# Patient Record
Sex: Female | Born: 1940
Health system: Southern US, Community
[De-identification: ages and names within clinical notes are randomized; demographics above are authoritative.]

## PROBLEM LIST (undated history)

## (undated) DIAGNOSIS — R55 Syncope and collapse: Secondary | ICD-10-CM

## (undated) DIAGNOSIS — R519 Headache, unspecified: Secondary | ICD-10-CM

## (undated) DIAGNOSIS — J302 Other seasonal allergic rhinitis: Secondary | ICD-10-CM

## (undated) DIAGNOSIS — E785 Hyperlipidemia, unspecified: Secondary | ICD-10-CM

## (undated) DIAGNOSIS — D62 Acute posthemorrhagic anemia: Secondary | ICD-10-CM

## (undated) DIAGNOSIS — I2510 Atherosclerotic heart disease of native coronary artery without angina pectoris: Secondary | ICD-10-CM

## (undated) DIAGNOSIS — M1711 Unilateral primary osteoarthritis, right knee: Secondary | ICD-10-CM

## (undated) DIAGNOSIS — E876 Hypokalemia: Secondary | ICD-10-CM

## (undated) DIAGNOSIS — E119 Type 2 diabetes mellitus without complications: Secondary | ICD-10-CM

## (undated) DIAGNOSIS — K219 Gastro-esophageal reflux disease without esophagitis: Secondary | ICD-10-CM

## (undated) DIAGNOSIS — Z9289 Personal history of other medical treatment: Secondary | ICD-10-CM

## (undated) DIAGNOSIS — I10 Essential (primary) hypertension: Secondary | ICD-10-CM

## (undated) DIAGNOSIS — I69322 Dysarthria following cerebral infarction: Secondary | ICD-10-CM

## (undated) DIAGNOSIS — IMO0001 Reserved for inherently not codable concepts without codable children: Secondary | ICD-10-CM

## (undated) DIAGNOSIS — I119 Hypertensive heart disease without heart failure: Secondary | ICD-10-CM

## (undated) DIAGNOSIS — R29810 Facial weakness: Secondary | ICD-10-CM

## (undated) DIAGNOSIS — I639 Cerebral infarction, unspecified: Secondary | ICD-10-CM

## (undated) DIAGNOSIS — M19041 Primary osteoarthritis, right hand: Secondary | ICD-10-CM

## (undated) DIAGNOSIS — D649 Anemia, unspecified: Secondary | ICD-10-CM

## (undated) DIAGNOSIS — E7800 Pure hypercholesterolemia, unspecified: Secondary | ICD-10-CM

## (undated) DIAGNOSIS — I69391 Dysphagia following cerebral infarction: Secondary | ICD-10-CM

## (undated) DIAGNOSIS — I5189 Other ill-defined heart diseases: Secondary | ICD-10-CM

## (undated) DIAGNOSIS — M1990 Unspecified osteoarthritis, unspecified site: Secondary | ICD-10-CM

## (undated) HISTORY — PX: BUNIONECTOMY: SHX129

## (undated) HISTORY — DX: Acute posthemorrhagic anemia: D62

## (undated) HISTORY — PX: MR LOWER LEG LEFT (ARMC HX): HXRAD1784

## (undated) HISTORY — DX: Hyperlipidemia, unspecified: E78.5

## (undated) HISTORY — DX: Type 2 diabetes mellitus without complications: E11.9

## (undated) HISTORY — DX: Unilateral primary osteoarthritis, right knee: M17.11

## (undated) HISTORY — DX: Cerebral infarction, unspecified: I63.9

## (undated) HISTORY — PX: CATARACT EXTRACTION W/ INTRAOCULAR LENS  IMPLANT, BILATERAL: SHX1307

## (undated) HISTORY — PX: KNEE ARTHROSCOPY WITH EXCISION BAKER'S CYST: SHX5646

## (undated) HISTORY — DX: Hypertensive heart disease without heart failure: I11.9

## (undated) HISTORY — DX: Dysphagia following cerebral infarction: I69.391

## (undated) HISTORY — DX: Other ill-defined heart diseases: I51.89

## (undated) HISTORY — DX: Primary osteoarthritis, right hand: M19.041

## (undated) HISTORY — DX: Facial weakness: R29.810

## (undated) HISTORY — PX: INGUINAL HERNIA REPAIR: SUR1180

## (undated) HISTORY — DX: Dysarthria following cerebral infarction: I69.322

## (undated) HISTORY — DX: Hypokalemia: E87.6

## (undated) HISTORY — DX: Atherosclerotic heart disease of native coronary artery without angina pectoris: I25.10

## (undated) HISTORY — PX: VEIN LIGATION AND STRIPPING: SHX2653

---

## 2015-07-16 DIAGNOSIS — M1990 Unspecified osteoarthritis, unspecified site: Secondary | ICD-10-CM | POA: Diagnosis not present

## 2015-07-16 DIAGNOSIS — E7800 Pure hypercholesterolemia, unspecified: Secondary | ICD-10-CM | POA: Diagnosis not present

## 2015-07-16 DIAGNOSIS — Z23 Encounter for immunization: Secondary | ICD-10-CM | POA: Diagnosis not present

## 2015-07-16 DIAGNOSIS — G629 Polyneuropathy, unspecified: Secondary | ICD-10-CM | POA: Diagnosis not present

## 2015-07-16 DIAGNOSIS — I10 Essential (primary) hypertension: Secondary | ICD-10-CM | POA: Diagnosis not present

## 2015-07-16 DIAGNOSIS — N3281 Overactive bladder: Secondary | ICD-10-CM | POA: Diagnosis not present

## 2015-08-18 DIAGNOSIS — M25561 Pain in right knee: Secondary | ICD-10-CM | POA: Diagnosis not present

## 2015-08-18 DIAGNOSIS — M25562 Pain in left knee: Secondary | ICD-10-CM | POA: Diagnosis not present

## 2015-09-15 DIAGNOSIS — M25561 Pain in right knee: Secondary | ICD-10-CM | POA: Diagnosis not present

## 2015-09-15 DIAGNOSIS — M25562 Pain in left knee: Secondary | ICD-10-CM | POA: Diagnosis not present

## 2015-10-13 DIAGNOSIS — M170 Bilateral primary osteoarthritis of knee: Secondary | ICD-10-CM | POA: Diagnosis not present

## 2015-12-10 DIAGNOSIS — M25561 Pain in right knee: Secondary | ICD-10-CM | POA: Diagnosis not present

## 2015-12-29 ENCOUNTER — Other Ambulatory Visit: Payer: Self-pay | Admitting: Physician Assistant

## 2015-12-31 ENCOUNTER — Other Ambulatory Visit (HOSPITAL_COMMUNITY): Payer: Self-pay | Admitting: *Deleted

## 2015-12-31 ENCOUNTER — Encounter (HOSPITAL_COMMUNITY): Payer: Self-pay

## 2015-12-31 ENCOUNTER — Encounter (HOSPITAL_COMMUNITY)
Admission: RE | Admit: 2015-12-31 | Discharge: 2015-12-31 | Disposition: A | Discharge status: Home / Self Care | Payer: Medicare Other | Source: Ambulatory Visit | Attending: Orthopaedic Surgery | Admitting: Orthopaedic Surgery

## 2015-12-31 DIAGNOSIS — I10 Essential (primary) hypertension: Secondary | ICD-10-CM | POA: Diagnosis not present

## 2015-12-31 DIAGNOSIS — E119 Type 2 diabetes mellitus without complications: Secondary | ICD-10-CM | POA: Diagnosis not present

## 2015-12-31 DIAGNOSIS — Z01812 Encounter for preprocedural laboratory examination: Secondary | ICD-10-CM | POA: Insufficient documentation

## 2015-12-31 DIAGNOSIS — Z01818 Encounter for other preprocedural examination: Secondary | ICD-10-CM | POA: Diagnosis not present

## 2015-12-31 DIAGNOSIS — K219 Gastro-esophageal reflux disease without esophagitis: Secondary | ICD-10-CM | POA: Insufficient documentation

## 2015-12-31 DIAGNOSIS — M1711 Unilateral primary osteoarthritis, right knee: Secondary | ICD-10-CM | POA: Diagnosis not present

## 2015-12-31 HISTORY — DX: Reserved for inherently not codable concepts without codable children: IMO0001

## 2015-12-31 HISTORY — DX: Essential (primary) hypertension: I10

## 2015-12-31 HISTORY — DX: Other seasonal allergic rhinitis: J30.2

## 2015-12-31 HISTORY — DX: Gastro-esophageal reflux disease without esophagitis: K21.9

## 2015-12-31 HISTORY — DX: Syncope and collapse: R55

## 2015-12-31 HISTORY — DX: Unspecified osteoarthritis, unspecified site: M19.90

## 2015-12-31 LAB — BASIC METABOLIC PANEL
ANION GAP: 10 (ref 5–15)
BUN: 23 mg/dL — AB (ref 6–20)
CALCIUM: 9.2 mg/dL (ref 8.9–10.3)
CO2: 25 mmol/L (ref 22–32)
CREATININE: 0.95 mg/dL (ref 0.44–1.00)
Chloride: 107 mmol/L (ref 101–111)
GFR calc Af Amer: >60 mL/min (ref >60)
GFR calc non Af Amer: 56 mL/min — ABNORMAL LOW (ref >60)
GLUCOSE: 89 mg/dL (ref 65–99)
Potassium: 4.1 mmol/L (ref 3.5–5.1)
Sodium: 142 mmol/L (ref 135–145)

## 2015-12-31 LAB — CBC
HCT: 38.8 % (ref 36.0–46.0)
HEMOGLOBIN: 12.4 g/dL (ref 12.0–15.0)
MCH: 27.6 pg (ref 26.0–34.0)
MCHC: 32 g/dL (ref 30.0–36.0)
MCV: 86.2 fL (ref 78.0–100.0)
Platelets: 224 10*3/uL (ref 150–400)
RBC: 4.5 MIL/uL (ref 3.87–5.11)
RDW: 14 % (ref 11.5–15.5)
WBC: 5.2 10*3/uL (ref 4.0–10.5)

## 2015-12-31 LAB — GLUCOSE, CAPILLARY: GLUCOSE-CAPILLARY: 96 mg/dL (ref 65–99)

## 2015-12-31 NOTE — Pre-Procedure Instructions (Signed)
    Letoya Figeroa  12/31/2015      [facility] - [place], [place] - [address] [place] [address] Phone: [phone] Fax: [phone]    Your procedure is scheduled on Tuesday, January 06, 2016 at 3:30 PM.   Report to  Hospital Entrance "A" Admitting Office at 1:30 PM.   Call this number if you have problems the morning of surgery: [phone]   Any questions prior to day of surgery, please call [phone] between 8 & 4 PM.   Remember:  Do not eat food or drink liquids after midnight. Monday, 01/05/16.  Take these medicines the morning of surgery with A SIP OF WATER: Gabapentin Enacarbil (Horizant), Metoprolol (Toprol XL), Tramadol - if needed  Stop NSAIDS (Meloxicam, Mobic, Aleve, Ibuprofen, etc.) as of today.   Do not wear jewelry, make-up or nail polish.  Do not wear lotions, powders, or perfumes.  You may wear deodorant.  Do not shave 48 hours prior to surgery.    Do not bring valuables to the hospital.  [facility] is not responsible for any belongings or valuables.  Contacts, dentures or bridgework may not be worn into surgery.  Leave your suitcase in the car.  After surgery it may be brought to your room.  For patients admitted to the hospital, discharge time will be determined by your treatment team.  Special instructions:  See "Preparing for Surgery" Instruction sheet.  Please read over the following fact sheets that you were given. Pain Booklet, Coughing and Deep Breathing and Surgical Site Infection Prevention       

## 2015-12-31 NOTE — Pre-Procedure Instructions (Signed)
    Kristi David  12/31/2015      [facility] - [place] - [address] [place] [address] Phone: [phone] Fax: [phone]    Your procedure is scheduled on Tuesday, January 06, 2016 at 3:30 PM.   [facility] to [facility] Entrance "A" Admitting Office at 1:30 PM.   Call this number if you have problems the morning of surgery: [phone]   Any questions prior to day of surgery, please call [phone] between 8 & 4 PM.   Remember:  Do not eat food or drink liquids after midnight. Monday, 01/05/16.  Take these medicines the morning of surgery with A SIP OF WATER: Gabapentin Enacarbil (Horizant), Metoprolol (Toprol XL), Tramadol - if needed  Stop NSAIDS (Meloxicam, Mobic, Aleve, Ibuprofen, etc.) as of today.   Do not wear jewelry, make-up or nail polish.  Do not wear lotions, powders, or perfumes.  You may wear deodorant.  Do not shave 48 hours prior to surgery.    Do not bring valuables to the hospital.  [facility] is not responsible for any belongings or valuables.  Contacts, dentures or bridgework may not be worn into surgery.  Leave your suitcase in the car.  After surgery it may be brought to your room.  For patients admitted to the hospital, discharge time will be determined by your treatment team.  Special instructions:  See "Preparing for Surgery" Instruction sheet.  Please read over the following fact sheets that you were given. Pain Booklet, Coughing and Deep Breathing and Surgical Site Infection Prevention

## 2015-12-31 NOTE — Progress Notes (Signed)
[place] as of July 2016.  She has never been a patient at [facility].   She was admitted [facility] [place] for a few after a syncope episode and passing out at which time she saw a cardiologist named Dr Archie EndoKhawaja Hussain [phone]).  She states she had a stress test and echocardiogram at that time.  These tests and last office visit note have been requested.  [place] in [place] is Dr Sigmund HazelLisa Miller with [facility] [place] last seen a few months ago  States has been told she is a Type 2 diabetic but is diet controlled.    EKG done today.

## 2016-01-01 LAB — HEMOGLOBIN A1C
Hgb A1c MFr Bld: 5.2 % (ref 4.8–5.6)
Mean Plasma Glucose: 103 mg/dL

## 2016-01-02 NOTE — Progress Notes (Addendum)
Anesthesia Chart Review: Patient is a 75 year old female scheduled for right knee partial arthroplasty on 01/06/16 by Dr. Doneen Poissonhristopher Blackman.  History includes syncope with hospitalization at [facility] in [place], [place] 03[place], HTN, exertional dyspnea, DM2 (diet controlled), GERD, seasonal allergies, BLE varicose vein stripping, bilateral baker's cyst excision, hernia repair.  [place] to [place] 03[place]. Her PCP is Dr. Sigmund HazelLisa Miller with [facility] [place]. She had been evaluated by cardiologist Dr. Archie EndoHussain Khawaja [facility] in [place] in the past. She reported a normal stress and echo, and states they never found anything to explain her syncopal spell.   12/31/15 EKG: NSR, LAD, borderline QT interval. Possible anterior infarct is no longer present but otherwise stable when compared to 01/08/15 received from from Dr. Archie EndoKhawaja Hussain.  10/07/14 Nuclear stress test ([facility], [place], [place] Impression: 1. No definite scintigraphic evidence of vasodilator induced myocardial ischemia. The slightly worsened perfusion to the anterior segments on stress compared to rest is favored to be secondary to shifting breast attenuation. This does limit evaluation for ischemia within the anterior wall.  2. Normal LV wall motion and wall thickening. 3. Normal visually estimated LV function with a calculated EF of 86%. 4. Focus of superficial radiotracer activity on or near the skin of the left breast. This is not seen on the rest acquisition. I favor this to represent radiotracer contamination on the skin, but correlation with mammogram is recommended to exclude underlying breast neoplasm. 5. Findings were discussed with Dr. Wynonia HazardKhawaja 10/07/14 at 12:54 (Dr. Harley AltoStewart Hawkins)  Preoperative labs noted. A1c 5.2   [facility] requested. Will leave chart for follow-up.  Velna Ochsllison Zelenak, PA-C [facility] Short Stay Center/Anesthesiology Phone [phone]  [phone] 01/02/2016 4:26 PM  Addendum: 08[facility] She was hospitalized for syncope at home after getting home from church. She was hypotensive (73/37) and bradycardic by EMS. While at the hospital, CXR showed pulmonary congestion bilaterally and question of linear density in the right basilar ganglia and CTA of the head and neck recommended which no acute vascular abnormality. Work-up for PE was negative. Carotid doppler showed no hemodynamically significant stenosis with minimal to mild intimal thickening. Neurology (Dr. Kathryne SharperHani Midani) was consulted. No clear neurologic etiology identified, so cardiology consult recommended. She was seen by Dr. Archie EndoHussain Khawaja. Echo ordered (see below). He was planning an out-patient Holter monitor.   09/02/14 Echo showed: Concentric LVH with preserved systolic function, estimated EF > 60%. Diastolic dysfunction. Trace MR. Mild TR. Estimated RVSP 36 mmHg. No evidence of PFO/ASD per color flow doppler. No evidence of intracardiac shunting per bubble study performed. Consider TEE if clinical indicated for further evaluation. No prior study for comparison.  Last office note from 04/07/15 received from Dr. Octavia HeirKhawja. By notes, her 09/03/14 30 day Cardionet showed: Normal study except occasional PVCs, HR ranging from 83-133. His note also stated that the radiotracer activity in the left breast found during her 2016 stress test was being followed by her PCP.   Based on currently available records, I would anticipate that she could proceed as planned in no acute changes.  Velna Ochsllison Zelenak, PA-C [facility] Stay Center/Anesthesiology Phone [phone] 01/05/2016 2:55 PM

## 2016-01-05 MED ORDER — CEFAZOLIN SODIUM-DEXTROSE 2-4 GM/100ML-% IV SOLN
2.0000 g | INTRAVENOUS | Status: AC
  Administered 2016-01-06: 2 g via INTRAVENOUS
  Filled 2016-01-05: qty 100

## 2016-01-06 ENCOUNTER — Ambulatory Visit (HOSPITAL_COMMUNITY): Payer: Medicare Other | Admitting: Vascular Surgery

## 2016-01-06 ENCOUNTER — Observation Stay (HOSPITAL_COMMUNITY)
Admission: RE | Admit: 2016-01-06 | Discharge: 2016-01-08 | Disposition: A | Discharge status: Home / Self Care | Payer: Medicare Other | Source: Ambulatory Visit | Attending: Orthopaedic Surgery | Admitting: Orthopaedic Surgery

## 2016-01-06 ENCOUNTER — Encounter (HOSPITAL_COMMUNITY): Payer: Self-pay | Admitting: *Deleted

## 2016-01-06 ENCOUNTER — Observation Stay (HOSPITAL_COMMUNITY): Payer: Medicare Other

## 2016-01-06 ENCOUNTER — Ambulatory Visit (HOSPITAL_COMMUNITY): Payer: Medicare Other | Admitting: Anesthesiology

## 2016-01-06 ENCOUNTER — Encounter (HOSPITAL_COMMUNITY)
Admission: RE | Disposition: A | Discharge status: Home / Self Care | Payer: Self-pay | Source: Ambulatory Visit | Attending: Orthopaedic Surgery

## 2016-01-06 DIAGNOSIS — Z96651 Presence of right artificial knee joint: Secondary | ICD-10-CM

## 2016-01-06 DIAGNOSIS — E119 Type 2 diabetes mellitus without complications: Secondary | ICD-10-CM | POA: Diagnosis not present

## 2016-01-06 DIAGNOSIS — M1711 Unilateral primary osteoarthritis, right knee: Secondary | ICD-10-CM | POA: Diagnosis not present

## 2016-01-06 DIAGNOSIS — Z471 Aftercare following joint replacement surgery: Secondary | ICD-10-CM | POA: Diagnosis not present

## 2016-01-06 DIAGNOSIS — G8918 Other acute postprocedural pain: Secondary | ICD-10-CM | POA: Diagnosis not present

## 2016-01-06 DIAGNOSIS — M179 Osteoarthritis of knee, unspecified: Secondary | ICD-10-CM | POA: Diagnosis not present

## 2016-01-06 DIAGNOSIS — I10 Essential (primary) hypertension: Secondary | ICD-10-CM | POA: Diagnosis not present

## 2016-01-06 HISTORY — DX: Type 2 diabetes mellitus without complications: E11.9

## 2016-01-06 HISTORY — DX: Pure hypercholesterolemia, unspecified: E78.00

## 2016-01-06 HISTORY — DX: Anemia, unspecified: D64.9

## 2016-01-06 HISTORY — DX: Personal history of other medical treatment: Z92.89

## 2016-01-06 HISTORY — PX: PARTIAL KNEE ARTHROPLASTY: SHX2174

## 2016-01-06 HISTORY — DX: Unilateral primary osteoarthritis, right knee: M17.11

## 2016-01-06 LAB — GLUCOSE, CAPILLARY
GLUCOSE-CAPILLARY: 85 mg/dL (ref 65–99)
Glucose-Capillary: 72 mg/dL (ref 65–99)

## 2016-01-06 SURGERY — ARTHROPLASTY, KNEE, UNICOMPARTMENTAL
Anesthesia: Regional | Site: Knee | Laterality: Right

## 2016-01-06 MED ORDER — OXYCODONE HCL 5 MG PO TABS
ORAL_TABLET | ORAL | Status: AC
  Filled 2016-01-06: qty 2

## 2016-01-06 MED ORDER — CEFAZOLIN SODIUM 1-5 GM-% IV SOLN
1.0000 g | Freq: Four times a day (QID) | INTRAVENOUS | Status: AC
  Administered 2016-01-06 – 2016-01-07 (×2): 1 g via INTRAVENOUS
  Filled 2016-01-06 (×2): qty 50

## 2016-01-06 MED ORDER — HYDROMORPHONE HCL 1 MG/ML IJ SOLN: 0.5000 mg | INTRAMUSCULAR | Status: DC | PRN

## 2016-01-06 MED ORDER — FENTANYL CITRATE (PF) 100 MCG/2ML IJ SOLN
25.0000 ug | INTRAMUSCULAR | Status: DC | PRN
  Administered 2016-01-06: 50 ug via INTRAVENOUS

## 2016-01-06 MED ORDER — ALUM & MAG HYDROXIDE-SIMETH 200-200-20 MG/5ML PO SUSP: 30.0000 mL | ORAL | Status: DC | PRN

## 2016-01-06 MED ORDER — LACTATED RINGERS IV SOLN
INTRAVENOUS | Status: DC | PRN
  Administered 2016-01-06 (×2): via INTRAVENOUS

## 2016-01-06 MED ORDER — ONDANSETRON HCL 4 MG PO TABS: 4.0000 mg | ORAL_TABLET | Freq: Four times a day (QID) | ORAL | Status: DC | PRN

## 2016-01-06 MED ORDER — FENTANYL CITRATE (PF) 100 MCG/2ML IJ SOLN
INTRAMUSCULAR | Status: AC
  Filled 2016-01-06: qty 2

## 2016-01-06 MED ORDER — FENTANYL CITRATE (PF) 100 MCG/2ML IJ SOLN
INTRAMUSCULAR | Status: AC
  Administered 2016-01-06: 100 ug via INTRAVENOUS
  Filled 2016-01-06: qty 2

## 2016-01-06 MED ORDER — LOSARTAN POTASSIUM 50 MG PO TABS
50.0000 mg | ORAL_TABLET | Freq: Every day | ORAL | Status: DC
  Administered 2016-01-06 – 2016-01-08 (×2): 50 mg via ORAL
  Filled 2016-01-06 (×3): qty 1

## 2016-01-06 MED ORDER — PROPOFOL 500 MG/50ML IV EMUL
INTRAVENOUS | Status: DC | PRN
  Administered 2016-01-06: 75 ug/kg/min via INTRAVENOUS

## 2016-01-06 MED ORDER — METHOCARBAMOL 500 MG PO TABS
ORAL_TABLET | ORAL | Status: AC
  Filled 2016-01-06: qty 1

## 2016-01-06 MED ORDER — DIPHENHYDRAMINE HCL 12.5 MG/5ML PO ELIX: 12.5000 mg | ORAL_SOLUTION | ORAL | Status: DC | PRN

## 2016-01-06 MED ORDER — GABAPENTIN ENACARBIL ER 600 MG PO TBCR
600.0000 mg | EXTENDED_RELEASE_TABLET | Freq: Every day | ORAL | Status: DC
  Filled 2016-01-06: qty 1

## 2016-01-06 MED ORDER — METHOCARBAMOL 500 MG PO TABS
500.0000 mg | ORAL_TABLET | Freq: Four times a day (QID) | ORAL | Status: DC | PRN
  Administered 2016-01-06 – 2016-01-07 (×3): 500 mg via ORAL
  Filled 2016-01-06 (×2): qty 1

## 2016-01-06 MED ORDER — SODIUM CHLORIDE 0.9 % IR SOLN
Status: DC | PRN
  Administered 2016-01-06: 1000 mL

## 2016-01-06 MED ORDER — SODIUM CHLORIDE 0.9 % IV SOLN
INTRAVENOUS | Status: DC
  Administered 2016-01-07: 05:00:00 via INTRAVENOUS

## 2016-01-06 MED ORDER — METOCLOPRAMIDE HCL 5 MG/ML IJ SOLN: 5.0000 mg | Freq: Three times a day (TID) | INTRAMUSCULAR | Status: DC | PRN

## 2016-01-06 MED ORDER — ONDANSETRON HCL 4 MG/2ML IJ SOLN: 4.0000 mg | Freq: Four times a day (QID) | INTRAMUSCULAR | Status: DC | PRN

## 2016-01-06 MED ORDER — FENTANYL CITRATE (PF) 100 MCG/2ML IJ SOLN
100.0000 ug | Freq: Once | INTRAMUSCULAR | Status: AC
Start: 2016-01-06 — End: 2016-01-06
  Administered 2016-01-06: 100 ug via INTRAVENOUS
  Filled 2016-01-06: qty 2

## 2016-01-06 MED ORDER — DEXTROSE 5 % IV SOLN
500.0000 mg | Freq: Four times a day (QID) | INTRAVENOUS | Status: DC | PRN
  Filled 2016-01-06: qty 5

## 2016-01-06 MED ORDER — METOCLOPRAMIDE HCL 5 MG PO TABS: 5.0000 mg | ORAL_TABLET | Freq: Three times a day (TID) | ORAL | Status: DC | PRN

## 2016-01-06 MED ORDER — METOPROLOL SUCCINATE ER 25 MG PO TB24
25.0000 mg | ORAL_TABLET | Freq: Every day | ORAL | Status: DC
  Administered 2016-01-07 – 2016-01-08 (×2): 25 mg via ORAL
  Filled 2016-01-06 (×2): qty 1

## 2016-01-06 MED ORDER — ZOLPIDEM TARTRATE 5 MG PO TABS: 5.0000 mg | ORAL_TABLET | Freq: Every evening | ORAL | Status: DC | PRN

## 2016-01-06 MED ORDER — PHENOL 1.4 % MT LIQD: 1.0000 | OROMUCOSAL | Status: DC | PRN

## 2016-01-06 MED ORDER — MIDAZOLAM HCL 2 MG/2ML IJ SOLN
INTRAMUSCULAR | Status: AC
Start: 2016-01-06 — End: 2016-01-06
  Administered 2016-01-06: 2 mg via INTRAVENOUS
  Filled 2016-01-06: qty 2

## 2016-01-06 MED ORDER — HYDRALAZINE HCL 20 MG/ML IJ SOLN: 20.0000 mg | Freq: Four times a day (QID) | INTRAMUSCULAR | Status: DC | PRN

## 2016-01-06 MED ORDER — LACTATED RINGERS IV SOLN
INTRAVENOUS | Status: DC
  Administered 2016-01-06: 13:00:00 via INTRAVENOUS

## 2016-01-06 MED ORDER — 0.9 % SODIUM CHLORIDE (POUR BTL) OPTIME
TOPICAL | Status: DC | PRN
  Administered 2016-01-06: 1000 mL

## 2016-01-06 MED ORDER — MIDAZOLAM HCL 2 MG/2ML IJ SOLN
2.0000 mg | Freq: Once | INTRAMUSCULAR | Status: AC
  Administered 2016-01-06: 2 mg via INTRAVENOUS
  Filled 2016-01-06: qty 2

## 2016-01-06 MED ORDER — MEPERIDINE HCL 25 MG/ML IJ SOLN: 6.2500 mg | INTRAMUSCULAR | Status: DC | PRN

## 2016-01-06 MED ORDER — METOCLOPRAMIDE HCL 5 MG/ML IJ SOLN: 10.0000 mg | Freq: Once | INTRAMUSCULAR | Status: DC | PRN

## 2016-01-06 MED ORDER — CHLORHEXIDINE GLUCONATE 4 % EX LIQD: 60.0000 mL | Freq: Once | CUTANEOUS | Status: DC

## 2016-01-06 MED ORDER — ACETAMINOPHEN 325 MG PO TABS: 650.0000 mg | ORAL_TABLET | Freq: Four times a day (QID) | ORAL | Status: DC | PRN

## 2016-01-06 MED ORDER — PROPOFOL 10 MG/ML IV BOLUS
INTRAVENOUS | Status: DC | PRN
  Administered 2016-01-06 (×2): 10 mg via INTRAVENOUS

## 2016-01-06 MED ORDER — BUPIVACAINE-EPINEPHRINE (PF) 0.5% -1:200000 IJ SOLN
INTRAMUSCULAR | Status: DC | PRN
  Administered 2016-01-06: 30 mL via PERINEURAL

## 2016-01-06 MED ORDER — MENTHOL 3 MG MT LOZG: 1.0000 | LOZENGE | OROMUCOSAL | Status: DC | PRN

## 2016-01-06 MED ORDER — ASPIRIN EC 325 MG PO TBEC
325.0000 mg | DELAYED_RELEASE_TABLET | Freq: Two times a day (BID) | ORAL | Status: DC
  Administered 2016-01-07 – 2016-01-08 (×3): 325 mg via ORAL
  Filled 2016-01-06 (×3): qty 1

## 2016-01-06 MED ORDER — ACETAMINOPHEN 650 MG RE SUPP: 650.0000 mg | Freq: Four times a day (QID) | RECTAL | Status: DC | PRN

## 2016-01-06 MED ORDER — OXYCODONE HCL 5 MG PO TABS
5.0000 mg | ORAL_TABLET | ORAL | Status: DC | PRN
  Administered 2016-01-06 – 2016-01-08 (×8): 10 mg via ORAL
  Filled 2016-01-06 (×7): qty 2

## 2016-01-06 SURGICAL SUPPLY — 63 items
BANDAGE ELASTIC 6 VELCRO ST LF (GAUZE/BANDAGES/DRESSINGS) ×3 IMPLANT
BANDAGE ESMARK 6X9 LF (GAUZE/BANDAGES/DRESSINGS) ×1 IMPLANT
BENZOIN TINCTURE PRP APPL 2/3 (GAUZE/BANDAGES/DRESSINGS) ×3 IMPLANT
BNDG ESMARK 6X9 LF (GAUZE/BANDAGES/DRESSINGS) ×3
BONE CEMENT PALACOSE (Orthopedic Implant) ×3 IMPLANT
BOWL SMART MIX CTS (DISPOSABLE) ×3 IMPLANT
CAPT KNEE PARTIAL 2 ×3 IMPLANT
CEMENT BONE PALACOSE (Orthopedic Implant) ×1 IMPLANT
CLOSURE WOUND 1/2 X4 (GAUZE/BANDAGES/DRESSINGS) ×1
COVER SURGICAL LIGHT HANDLE (MISCELLANEOUS) ×3 IMPLANT
CUFF TOURNIQUET SINGLE 34IN LL (TOURNIQUET CUFF) ×3 IMPLANT
CUFF TOURNIQUET SINGLE 44IN (TOURNIQUET CUFF) IMPLANT
DRAPE INCISE IOBAN 66X45 STRL (DRAPES) ×3 IMPLANT
DRAPE ORTHO SPLIT 77X108 STRL (DRAPES) ×4
DRAPE PROXIMA HALF (DRAPES) ×3 IMPLANT
DRAPE SURG ORHT 6 SPLT 77X108 (DRAPES) ×2 IMPLANT
DRAPE U-SHAPE 47X51 STRL (DRAPES) ×3 IMPLANT
DRSG PAD ABDOMINAL 8X10 ST (GAUZE/BANDAGES/DRESSINGS) ×3 IMPLANT
DURAPREP 26ML APPLICATOR (WOUND CARE) ×3 IMPLANT
ELECT CAUTERY BLADE 6.4 (BLADE) ×3 IMPLANT
ELECT REM PT RETURN 9FT ADLT (ELECTROSURGICAL) ×3
ELECTRODE REM PT RTRN 9FT ADLT (ELECTROSURGICAL) ×1 IMPLANT
FACESHIELD WRAPAROUND (MASK) ×6 IMPLANT
GAUZE SPONGE 4X4 12PLY STRL (GAUZE/BANDAGES/DRESSINGS) ×3 IMPLANT
GAUZE XEROFORM 1X8 LF (GAUZE/BANDAGES/DRESSINGS) IMPLANT
GLOVE BIOGEL PI IND STRL 8 (GLOVE) ×2 IMPLANT
GLOVE BIOGEL PI INDICATOR 8 (GLOVE) ×4
GLOVE ORTHO TXT STRL SZ7.5 (GLOVE) ×3 IMPLANT
GLOVE SURG ORTHO 8.0 STRL STRW (GLOVE) ×3 IMPLANT
GOWN STRL REUS W/ TWL LRG LVL3 (GOWN DISPOSABLE) ×1 IMPLANT
GOWN STRL REUS W/ TWL XL LVL3 (GOWN DISPOSABLE) ×3 IMPLANT
GOWN STRL REUS W/TWL LRG LVL3 (GOWN DISPOSABLE) ×2
GOWN STRL REUS W/TWL XL LVL3 (GOWN DISPOSABLE) ×6
HANDPIECE INTERPULSE COAX TIP (DISPOSABLE) ×2
IMMOBILIZER KNEE 22 UNIV (SOFTGOODS) ×3 IMPLANT
KIT BASIN OR (CUSTOM PROCEDURE TRAY) ×3 IMPLANT
KIT ROOM TURNOVER OR (KITS) ×3 IMPLANT
MANIFOLD NEPTUNE II (INSTRUMENTS) ×3 IMPLANT
NDL SAFETY ECLIPSE 18X1.5 (NEEDLE) IMPLANT
NEEDLE HYPO 18GX1.5 SHARP (NEEDLE)
NS IRRIG 1000ML POUR BTL (IV SOLUTION) ×3 IMPLANT
PACK BLADE SAW RECIP 70 3 PT (BLADE) ×3 IMPLANT
PACK TOTAL JOINT (CUSTOM PROCEDURE TRAY) ×3 IMPLANT
PACK UNIVERSAL I (CUSTOM PROCEDURE TRAY) IMPLANT
PAD ARMBOARD 7.5X6 YLW CONV (MISCELLANEOUS) ×3 IMPLANT
PADDING CAST COTTON 6X4 STRL (CAST SUPPLIES) ×3 IMPLANT
SET HNDPC FAN SPRY TIP SCT (DISPOSABLE) ×1 IMPLANT
STAPLER VISISTAT 35W (STAPLE) IMPLANT
STRIP CLOSURE SKIN 1/2X4 (GAUZE/BANDAGES/DRESSINGS) ×2 IMPLANT
SUCTION FRAZIER HANDLE 10FR (MISCELLANEOUS) ×2
SUCTION TUBE FRAZIER 10FR DISP (MISCELLANEOUS) ×1 IMPLANT
SUT MNCRL AB 4-0 PS2 18 (SUTURE) ×6 IMPLANT
SUT VIC AB 0 CT1 27 (SUTURE) ×2
SUT VIC AB 0 CT1 27XBRD ANBCTR (SUTURE) ×1 IMPLANT
SUT VIC AB 1 CT1 27 (SUTURE) ×4
SUT VIC AB 1 CT1 27XBRD ANBCTR (SUTURE) ×2 IMPLANT
SUT VIC AB 2-0 CT1 27 (SUTURE) ×2
SUT VIC AB 2-0 CT1 TAPERPNT 27 (SUTURE) ×1 IMPLANT
SYR 50ML LL SCALE MARK (SYRINGE) IMPLANT
TOWEL OR 17X24 6PK STRL BLUE (TOWEL DISPOSABLE) ×3 IMPLANT
TOWEL OR 17X26 10 PK STRL BLUE (TOWEL DISPOSABLE) ×3 IMPLANT
TRAY FOLEY CATH 16FRSI W/METER (SET/KITS/TRAYS/PACK) ×3 IMPLANT
WRAP KNEE MAXI GEL POST OP (GAUZE/BANDAGES/DRESSINGS) ×3 IMPLANT

## 2016-01-06 NOTE — Progress Notes (Signed)
Cane taken to PACU along with bag of belongings.

## 2016-01-06 NOTE — H&P (Signed)
TOTAL KNEE ADMISSION H&P  Patient is being admitted for right partial knee arthroplasty.  Subjective:  Chief Complaint:right knee pain.  HPI: Kristi David, 75 y.o. female, has a history of pain and functional disability in the right knee due to arthritis and has failed non-surgical conservative treatments for greater than 12 weeks to includeNSAID's and/or analgesics, corticosteriod injections, viscosupplementation injections, supervised PT with diminished ADL's post treatment, use of assistive devices, weight reduction as appropriate and activity modification.  Onset of symptoms was gradual, starting 4 years ago with gradually worsening course since that time. The patient noted no past surgery on the right knee(s).  Patient currently rates pain in the right knee(s) at 10 out of 10 with activity. Patient has night pain, worsening of pain with activity and weight bearing, pain that interferes with activities of daily living, pain with passive range of motion, crepitus and joint swelling.  Patient has evidence of subchondral sclerosis, periarticular osteophytes and joint space narrowing by imaging studies. There is no active infection.  Patient Active Problem List   Diagnosis Date Noted  . Osteoarthritis of right knee 01/06/2016   Past Medical History  Diagnosis Date  . Syncope     passed out and was hospitalized for a couple of days  . Hypertension   . Shortness of breath dyspnea     if too active  . Seasonal allergies   . Diabetes mellitus without complication (HCC)     does not take medicine diet controlled  . GERD (gastroesophageal reflux disease)     no meds  . Arthritis     Past Surgical History  Procedure Laterality Date  . Vein ligation and stripping      bilateral  . Hernia repair    . Mr lower leg left ([facility] hx) Left     after a break  . Knee arthroscopy with excision baker's cyst Bilateral   . Bunionectomy Bilateral     No prescriptions prior to admission   Allergies   Allergen Reactions  . Oregano [Origanum Oil] Other (See Comments)    Congestion, stuffy nose  . Peanut-Containing Drug Products Other (See Comments)    Congestion, stuffy nose Can eat cooked peanuts if pre-med with benadryl  . Tomato Other (See Comments)    Congestion, stuffy nose    Social History  Substance Use Topics  . Smoking status: Never Smoker   . Smokeless tobacco: Not on file  . Alcohol Use: No    No family history on file.   Review of Systems  Musculoskeletal: Positive for joint pain.  All other systems reviewed and are negative.   Objective:  Physical Exam  Constitutional: She is oriented to person, place, and time. She appears well-developed and well-nourished.  HENT:  Head: Normocephalic and atraumatic.  Eyes: EOM are normal. Pupils are equal, round, and reactive to light.  Neck: Normal range of motion. Neck supple.  Cardiovascular: Normal rate and regular rhythm.   Respiratory: Effort normal and breath sounds normal.  GI: Soft. Bowel sounds are normal.  Musculoskeletal:       Right knee: She exhibits decreased range of motion, swelling, effusion and abnormal alignment. Tenderness found. Medial joint line tenderness noted.  Neurological: She is alert and oriented to person, place, and time.  Skin: Skin is warm and dry.  Psychiatric: She has a normal mood and affect.    Vital signs in last 24 hours:    Labs:   There is no height or weight on file to  calculate BMI.   Imaging Review Plain radiographs demonstrate severe degenerative joint disease of the right knee medial compartment. The overall alignment ismild varus. The bone quality appears to be good for age and reported activity level.  Assessment/Plan:  End stage arthritis, right knee mainly medial compartment  The patient history, physical examination, clinical judgment of the provider and imaging studies are consistent with end stage degenerative joint disease of the right knee(s) and  partial knee arthroplasty is deemed medically necessary. The treatment options including medical management, injection therapy arthroscopy and arthroplasty were discussed at length. The risks and benefits of partial knee arthroplasty were presented and reviewed. The risks due to aseptic loosening, infection, stiffness, patella tracking problems, thromboembolic complications and other imponderables were discussed. The patient acknowledged the explanation, agreed to proceed with the plan and consent was signed. Patient is being admitted for inpatient treatment for surgery, pain control, PT, OT, prophylactic antibiotics, VTE prophylaxis, progressive ambulation and ADL's and discharge planning. The patient is planning to be discharged home with home health services

## 2016-01-06 NOTE — Anesthesia Procedure Notes (Addendum)
Anesthesia Regional Block:  Adductor canal block  Pre-Anesthetic Checklist: ,, timeout performed, Correct Patient, Correct Site, Correct Laterality, Correct Procedure, Correct Position, site marked, Risks and benefits discussed, Surgical consent,  Pre-op evaluation,  Post-op pain management  Laterality: Right  Prep: chloraprep       Needles:  Injection technique: Single-shot  Needle Type: Stimiplex     Needle Length: 9cm 9 cm Needle Gauge: 21 and 21 G    Additional Needles:  Procedures: ultrasound guided (picture in chart) Adductor canal block Narrative:  Injection made incrementally with aspirations every 5 mL.  Performed by: Personally  Anesthesiologist: Nolon Nations  Additional Notes: BP cuff, EKG monitors applied. Sedation begun. Artery and nerve location verified with U/S and anesthetic injected incrementally, slowly, and after negative aspirations under direct u/s guidance. Good fascial /perineural spread. Tolerated well.   Spinal Patient location during procedure: OR Staffing Anesthesiologist: Nolon Nations Performed by: anesthesiologist  Preanesthetic Checklist Completed: patient identified, site marked, surgical consent, pre-op evaluation, timeout performed, IV checked, risks and benefits discussed and monitors and equipment checked Spinal Block Patient position: sitting Prep: ChloraPrep Patient monitoring: heart rate, continuous pulse ox and blood pressure Location: L3-4 Injection technique: single-shot Needle Needle type: Sprotte  Needle gauge: 24 G Needle length: 9 cm Additional Notes Expiration date of kit checked and confirmed. Patient tolerated procedure well, without complications.

## 2016-01-06 NOTE — Anesthesia Preprocedure Evaluation (Addendum)
Anesthesia Evaluation  Patient identified by MRN, date of birth, ID band Patient awake    Reviewed: Allergy & Precautions, NPO status , Patient's Chart, lab work & pertinent test results  Airway Mallampati: II  TM Distance: >3 FB Neck ROM: Full    Dental no notable dental hx.    Pulmonary neg pulmonary ROS,    Pulmonary exam normal breath sounds clear to auscultation       Cardiovascular hypertension, Pt. on medications negative cardio ROS Normal cardiovascular exam Rhythm:Regular Rate:Normal     Neuro/Psych negative neurological ROS  negative psych ROS   GI/Hepatic negative GI ROS, Neg liver ROS,   Endo/Other  negative endocrine ROSdiabetes  Renal/GU negative Renal ROS  negative genitourinary   Musculoskeletal negative musculoskeletal ROS (+)   Abdominal   Peds negative pediatric ROS (+)  Hematology negative hematology ROS (+)   Anesthesia Other Findings   Reproductive/Obstetrics negative OB ROS                          Anesthesia Physical Anesthesia Plan  ASA: II  Anesthesia Plan: Spinal and Regional   Post-op Pain Management:    Induction:   Airway Management Planned: Simple Face Mask  Additional Equipment:   Intra-op Plan:   Post-operative Plan:   Informed Consent: I have reviewed the patients History and Physical, chart, labs and discussed the procedure including the risks, benefits and alternatives for the proposed anesthesia with the patient or authorized representative who has indicated his/her understanding and acceptance.   Dental advisory given  Plan Discussed with: CRNA  Anesthesia Plan Comments:        Anesthesia Quick Evaluation

## 2016-01-06 NOTE — Brief Op Note (Signed)
01/06/2016  5:03 PM  PATIENT:  Jossie NgHettie Brett  75 y.o. female  PRE-OPERATIVE DIAGNOSIS:  Right knee medial compartment arthritis  POST-OPERATIVE DIAGNOSIS:  Right knee medial compartment arthritis  PROCEDURE:  Procedure(s): RIGHT UNICOMPARTMENTAL KNEE ARTHROPLASTY (Right)  SURGEON:  Surgeon(s) and Role:    * Kathryne Hitchhristopher Y Parsa Rickett, MD - Primary  PHYSICIAN ASSISTANT: Rexene EdisonGil Clark, PA-C  ANESTHESIA:   spinal  EBL:  Total I/O In: 1000 [I.V.:1000] Out: -   COUNTS:  YES  TOURNIQUET:  * Missing tourniquet times found for documented tourniquets in log:  [id number] *  DICTATION: .Other Dictation: Dictation Number [id number]  PLAN OF CARE: Admit to inpatient   PATIENT DISPOSITION:  PACU - hemodynamically stable.   Delay start of Pharmacological VTE agent (>24hrs) due to surgical blood loss or risk of bleeding: no

## 2016-01-06 NOTE — Transfer of Care (Signed)
Immediate Anesthesia Transfer of Care Note  Patient: Kristi David  Procedure(s) Performed: Procedure(s): RIGHT UNICOMPARTMENTAL KNEE ARTHROPLASTY (Right)  Patient Location: PACU  Anesthesia Type:Spinal  Level of Consciousness: awake, alert  and oriented  Airway & Oxygen Therapy: Patient Spontanous Breathing and Patient connected to nasal cannula oxygen  Post-op Assessment: Report given to RN and Post -op Vital signs reviewed and stable  Post vital signs: Reviewed and stable  Last Vitals:  Filed Vitals:   01/06/16 1510 01/06/16 1511  BP:    Pulse: 54 53  Temp:    Resp: 20 14    Complications: No apparent anesthesia complications

## 2016-01-06 NOTE — Progress Notes (Signed)
Upper & lower dentures returned to patient in PACU. Pt belonging bag sent with patient to 5 north

## 2016-01-07 ENCOUNTER — Encounter (HOSPITAL_COMMUNITY): Payer: Self-pay | Admitting: Orthopaedic Surgery

## 2016-01-07 DIAGNOSIS — M1711 Unilateral primary osteoarthritis, right knee: Secondary | ICD-10-CM | POA: Diagnosis not present

## 2016-01-07 DIAGNOSIS — I10 Essential (primary) hypertension: Secondary | ICD-10-CM | POA: Diagnosis not present

## 2016-01-07 DIAGNOSIS — E119 Type 2 diabetes mellitus without complications: Secondary | ICD-10-CM | POA: Diagnosis not present

## 2016-01-07 MED ORDER — LOSARTAN POTASSIUM 50 MG PO TABS
50.0000 mg | ORAL_TABLET | Freq: Once | ORAL | Status: AC
  Administered 2016-01-07: 50 mg via ORAL
  Filled 2016-01-07: qty 1

## 2016-01-07 NOTE — Progress Notes (Signed)
Subjective: 1 Day Post-Op Procedure(s) (LRB): RIGHT UNICOMPARTMENTAL KNEE ARTHROPLASTY (Right) Patient reports pain as moderate.    Objective: Vital signs in last 24 hours: Temp:  [97.6 F (36.4 C)-99 F (37.2 C)] 99 F (37.2 C) (04/12 0138) Pulse Rate:  [49-68] 59 (04/12 0138) Resp:  [7-20] 18 (04/12 0138) BP: (133-195)/(49-83) 149/49 mmHg (04/12 0138) SpO2:  [97 %-100 %] 98 % (04/12 0138) Weight:  [87.091 kg (192 lb)] 87.091 kg (192 lb) (04/11 1303)  Intake/Output from previous day: 04/11 0701 - 04/12 0700 In: 2100 [I.V.:2000; IV Piggyback:100] Out: 2400 [Urine:2200; Blood:200] Intake/Output this shift:    No results for input(s): HGB in the last 72 hours. No results for input(s): WBC, RBC, HCT, PLT in the last 72 hours. No results for input(s): NA, K, CL, CO2, BUN, CREATININE, GLUCOSE, CALCIUM in the last 72 hours. No results for input(s): LABPT, INR in the last 72 hours.  Sensation intact distally Intact pulses distally Dorsiflexion/Plantar flexion intact Incision: dressing C/D/I Compartment soft  Assessment/Plan: 1 Day Post-Op Procedure(s) (LRB): RIGHT UNICOMPARTMENTAL KNEE ARTHROPLASTY (Right) Up with therapy Discharge home with home health this evening vs tomorrow am.  Kathryne HitchBLACKMAN,Kristi David 01/07/2016, 7:09 AM

## 2016-01-07 NOTE — Anesthesia Postprocedure Evaluation (Addendum)
Anesthesia Post Note  Patient: Kristi David  Procedure(s) Performed: Procedure(s) (LRB): RIGHT UNICOMPARTMENTAL KNEE ARTHROPLASTY (Right)  Patient location during evaluation: PACU Anesthesia Type: Spinal, MAC and Regional Level of consciousness: awake and alert Pain management: pain level controlled Vital Signs Assessment: post-procedure vital signs reviewed and stable Respiratory status: spontaneous breathing and respiratory function stable Cardiovascular status: blood pressure returned to baseline and stable Postop Assessment: spinal receding Anesthetic complications: no    Last Vitals:  Filed Vitals:   01/07/16 0138 01/07/16 0646  BP: 149/49 127/53  Pulse: 59 68  Temp: 37.2 C 36.9 C  Resp: 18 18    Last Pain:  Filed Vitals:   01/07/16 0732  PainSc: Asleep                 Lewie LoronJohn Samanta Gal

## 2016-01-07 NOTE — Care Management Note (Signed)
Case Management Note  Patient Details  Name: Kristi David MRN: [id number] Date of Birth: 11/27/1940  Subjective/Objective:  75 yr old female s/p right unicompartmental knee.                  Action/Plan: Case manager spoke with patient at bedside concerning home health and DME needs at discharge. [facility], no changes. Case manager has ordered Rolling walker and 3in1. Patient states her daughter will assist her at discharge . Expected Discharge Date:   01/08/16               Expected Discharge Plan:  Home w Home Health Services  In-House Referral:     Discharge planning Services  CM Consult  Post Acute Care Choice:  Durable Medical Equipment, Home Health Choice offered to:  Patient  DME Arranged:  3-N-1, Walker rolling DME Agency:  [facility]  [facility]  [facility]  [facility]  Status of Service:  Completed, signed off  Medicare Important Message Given:    Date Medicare IM Given:    Medicare IM give by:    Date Additional Medicare IM Given:    Additional Medicare Important Message give by:     If discussed at Long Length of Stay Meetings, dates discussed:    Additional Comments:  Durenda GuthrieBrady, Tawfiq Favila Naomi, RN 01/07/2016, 10:53 AM

## 2016-01-07 NOTE — Progress Notes (Signed)
Physical Therapy Treatment Patient Details Name: Kristi David MRN: [id number] DOB: 07/26/1941 Today's Date: 01/07/2016    History of Present Illness Admitted for R unicompartmental knee replacement;  has a past medical history of Syncope; Hypertension; Shortness of breath dyspnea; Seasonal allergies; Diabetes mellitus without complication (HCC); GERD (gastroesophageal reflux disease); and Arthritis.    PT Comments    Moving slower this pm, but nice, stable knee in stance; on track for dc home tomorrow  Follow Up Recommendations  Home health PT;Supervision/Assistance - 24 hour     Equipment Recommendations  Rolling walker with 5" wheels;3in1 (PT) (shower chair as well)    Recommendations for Other Services       Precautions / Restrictions Precautions Precautions: Knee Precaution Comments: Pt educated to not allow any pillow or bolster under knee for healing with optimal range of motion.  Restrictions Weight Bearing Restrictions: Yes RLE Weight Bearing: Weight bearing as tolerated    Mobility  Bed Mobility Overal bed mobility: Needs Assistance Bed Mobility: Sit to Supine       Sit to supine: Supervision   General bed mobility comments: Able to lift RLE onto bed without assist  Transfers Overall transfer level: Needs assistance Equipment used: Rolling walker (2 wheeled) Transfers: Sit to/from Stand Sit to Stand: Min guard Stand pivot transfers: Min guard       General transfer comment: Cues for hand placement and safety  Ambulation/Gait Ambulation/Gait assistance: Min guard Ambulation Distance (Feet): 110 Feet Assistive device: Rolling walker (2 wheeled) Gait Pattern/deviations: Step-to pattern;Step-through pattern   Gait velocity interpretation: Below normal speed for age/gender General Gait Details: Cues to activate R quad for stance stability; cues also to self-monitor for activity tolerance and for optimal RW position (steps too far into RW); much slower  this pm than am session   Stairs            Wheelchair Mobility    Modified Rankin (Stroke Patients Only)       Balance Overall balance assessment: Needs assistance Sitting-balance support: Feet supported Sitting balance-Leahy Scale: Good     Standing balance support: During functional activity Standing balance-Leahy Scale: Fair                      Cognition Arousal/Alertness: Awake/alert Behavior During Therapy: WFL for tasks assessed/performed Overall Cognitive Status: Within Functional Limits for tasks assessed                      Exercises      General Comments        Pertinent Vitals/Pain Pain Assessment: 0-10 Pain Score: 5  Pain Location: Rt knee Pain Descriptors / Indicators: Aching Pain Intervention(s): Monitored during session    Home Living Family/patient expects to be discharged to:: Private residence Living Arrangements: Alone (children will be with pt when she gets home) Available Help at Discharge: Family;Available 24 hours/day (Daughter will stay with her first 2 weeks home) Type of Home: Apartment Home Access: Elevator   Home Layout: One level Home Equipment: Grab bars - tub/shower Additional Comments: Pt has had 3in1 and RW delivered to room     Prior Function Level of Independence: Independent          PT Goals (current goals can now be found in the care plan section) Acute Rehab PT Goals Patient Stated Goal: to go home  PT Goal Formulation: With patient Time For Goal Achievement: 01/14/16 Potential to Achieve Goals: Good    Frequency  7X/week    PT Plan Current plan remains appropriate    Co-evaluation             End of Session Equipment Utilized During Treatment: Gait belt Activity Tolerance: Patient tolerated treatment well Patient left: in bed;with call bell/phone within reach;with family/visitor present     Time: [id number] PT Time Calculation (min) (ACUTE ONLY): 25 min  Charges:   $Gait Training: 23-37 mins                    G Codes:      Olen PelGarrigan, Payslie Mccaig Hamff 01/07/2016, 3:11 PM  Van ClinesHolly Quamesha Mullet, [place]PT  Acute Rehabilitation Services Pager [phone] Office [phone]

## 2016-01-07 NOTE — Evaluation (Signed)
Occupational Therapy Evaluation Patient Details Name: Kristi David MRN: [id number] DOB: 06/04/1941 Today's Date: 01/07/2016    History of Present Illness Admitted for R unicompartmental knee replacement;  has a past medical history of Syncope; Hypertension; Shortness of breath dyspnea; Seasonal allergies; Diabetes mellitus without complication (HCC); GERD (gastroesophageal reflux disease); and Arthritis.   Clinical Impression   Patient evaluated by Occupational Therapy with no further acute OT needs identified. All education has been completed and the patient has no further questions. Pt requires min guard assist for functional mobility and mod A for LB ADLs as she is not yet able to access Rt foot.  She was encouraged to attempt LB ADLs daily before asking for assist to improve knee ROM.  All education completed.  See below for any follow-up Occupational Therapy or equipment needs. OT is signing off. Thank you for this referral.      Follow Up Recommendations  No OT follow up;Supervision/Assistance - 24 hour    Equipment Recommendations  None recommended by OT    Recommendations for Other Services       Precautions / Restrictions Precautions Precautions: Knee Restrictions Weight Bearing Restrictions: Yes RLE Weight Bearing: Weight bearing as tolerated      Mobility Bed Mobility                  Transfers Overall transfer level: Needs assistance Equipment used: Rolling walker (2 wheeled) Transfers: Sit to/from Stand;Stand Pivot Transfers Sit to Stand: Min guard Stand pivot transfers: Min guard       General transfer comment: pt guarded with movement     Balance Overall balance assessment: Needs assistance Sitting-balance support: Feet supported Sitting balance-Leahy Scale: Good     Standing balance support: During functional activity Standing balance-Leahy Scale: Fair                              ADL Overall ADL's : Needs  assistance/impaired Eating/Feeding: Independent;Sitting   Grooming: Wash/dry hands;Wash/dry face;Oral care;Min guard;Standing   Upper Body Bathing: Set up;Sitting   Lower Body Bathing: Moderate assistance;Sit to/from stand Lower Body Bathing Details (indicate cue type and reason): difficulty accessing Rt foot  Upper Body Dressing : Set up;Sitting   Lower Body Dressing: Moderate assistance;Sit to/from stand Lower Body Dressing Details (indicate cue type and reason): difficulty accessing Rt foot  Toilet Transfer: Min guard;Ambulation;Comfort height toilet;Grab bars;RW   Toileting- Architect and Hygiene: Min guard;Sit to/from stand   Tub/ Shower Transfer: Min guard;Ambulation;3 in 1;Grab bars   Functional mobility during ADLs: Min guard;Rolling walker General ADL Comments: Pt instructed to attempt to access Rt foot every day in when performing LB ADLs to promote knee ROM.   If she is unable to access foot, then ask her daughter for assistance.  Pt instructed in walker safety when negotiating small spaces      Vision     Perception     Praxis      Pertinent Vitals/Pain Pain Assessment: 0-10 Pain Score: 4  Pain Location: Rt knee Pain Descriptors / Indicators: Aching Pain Intervention(s): Monitored during session     Hand Dominance Right   Extremity/Trunk Assessment Upper Extremity Assessment Upper Extremity Assessment: Overall WFL for tasks assessed   Lower Extremity Assessment Lower Extremity Assessment: Defer to PT evaluation   Cervical / Trunk Assessment Cervical / Trunk Assessment: Normal   Communication Communication Communication: No difficulties   Cognition Arousal/Alertness: Awake/alert Behavior During Therapy: WFL for tasks  assessed/performed Overall Cognitive Status: Within Functional Limits for tasks assessed                     General Comments       Exercises       Shoulder Instructions      Home Living Family/patient  expects to be discharged to:: Private residence Living Arrangements: Alone Available Help at Discharge: Family;Available 24 hours/day (Daughter will stay with her first 2 weeks home) Type of Home: Apartment Home Access: Elevator     Home Layout: One level     Bathroom Shower/Tub: Walk-in shower         Home Equipment: Grab bars - tub/shower   Additional Comments: Pt has had 3in1 and RW delivered to room       Prior Functioning/Environment Level of Independence: Independent             OT Diagnosis: Generalized weakness;Acute pain   OT Problem List:     OT Treatment/Interventions:      OT Goals(Current goals can be found in the care plan section) Acute Rehab OT Goals Patient Stated Goal: to go home  OT Goal Formulation: All assessment and education complete, DC therapy  OT Frequency:     Barriers to D/C:            Co-evaluation              End of Session Equipment Utilized During Treatment: Rolling walker Nurse Communication: Mobility status  Activity Tolerance: Patient tolerated treatment well Patient left: Other (comment) (with PT )   Time: [id number] OT Time Calculation (min): 20 min Charges:  OT General Charges $OT Visit: 1 Procedure OT Evaluation $OT Eval Low Complexity: 1 Procedure G-Codes: OT G-codes **NOT FOR INPATIENT CLASS** Functional Limitation: Self care Self Care Current Status (V4098(G8987): At least 20 percent but less than 40 percent impaired, limited or restricted Self Care Goal Status (J1914(G8988): At least 20 percent but less than 40 percent impaired, limited or restricted Self Care Discharge Status [phone](G8989): At least 20 percent but less than 40 percent impaired, limited or restricted  Georgana Romain M 01/07/2016, 2:00 PM

## 2016-01-07 NOTE — Op Note (Signed)
NAMEBRANDEE, MARKIN NO.:  [id number]  MEDICAL RECORD NO.:  [id number]  LOCATION:  5N12C                        FACILITY:  [facility]  PHYSICIAN:  Vanita Panda. Magnus Ivan, M.D.DATE OF BIRTH:  1941/02/05  DATE OF PROCEDURE:  01/06/2016 DATE OF DISCHARGE:                              OPERATIVE REPORT   PREOPERATIVE DIAGNOSIS:  Right knee tricompartmental arthritis, but mainly affecting the medial compartment.  POSTOPERATIVE DIAGNOSIS:  Right knee tricompartmental arthritis, but mainly affecting the medial compartment.  PROCEDURE:  Right partial/unicompartmental knee replacement.  IMPLANTS:  [employer] right medial unicompartmental knee with size small femur, size C tibial tray, 4 mm mobile-bearing polyethylene insert.  SURGEON:  Vanita Panda. Magnus Ivan, M.D.  ASSISTANT:  Richardean Canal, PA-C.  ANESTHESIA: 1. Adductor canal block, right leg. 2. Spinal.  BLOOD LOSS:  Less than 100 mL.  TOURNIQUET TIME:  Under an hour and half.  COMPLICATIONS:  None.  INDICATIONS:  Kristi David is a 75 year old female, well known to me. Although she has tricompartmental arthritis of her knee, it involves mainly medial compartment and her pain is only medially.  She has asymptomatic patellofemoral crepitation.  She has a knee with an intact ACL and easily correctable varus deformity.  She does not wish to proceed with a total knee arthroplasty.  Will try to have a partial knee arthroplasty.  She is a very low demand person, and I agree with proceeding with this.  With this understanding, would have a total knee as a backup if her arthritis was too overwhelming.  The risks and benefits of surgery were explained to her in detail, and she did wish to proceed.  PROCEDURE DESCRIPTION:  After informed consent was obtained, appropriate right knee was marked.  Anesthesia obtained from regional block.  When she was brought to the operating room, spinal anesthesia was  obtained because the adductor canal block may not have been working as well.  She was laid in a supine position.  A nonsterile tourniquet was placed around her upper right thigh, and her right leg was prepped and draped with DuraPrep and sterile drapes including sterile stockinette.  With the bed raised and the right knee in appropriate leg holder proximal to the popliteal fossa and the left knee was protected, it was flexed down. Time-out was called to identify correct patient, correct right knee.  We then used an Esmarch to wrap around the leg and tourniquet was inflated to 300 mm of pressure.  I then made a medial incision just medial to the patella proximally and carried this down distally to the medial edge of patellar tendon and tibial tubercle.  We performed a medial arthrotomy and found a large joint effusion and significant wear of the medial compartment of her knee.  Her ACL and PCL were intact.  There was wear of the patellofemoral joint, but no significant wear of the lateral compartment at all.  With the knee in a flexed position, we then used our trial sizing spoons for the femur and chose a small femur.  We then did our extramedullary cutting guide for the tibia setting it off the femoral spoon as well to take our tibial cut using a sagittal  saw and an oscillating saw and removed this in entirety and trialed this for a size C tibia.  We then placed our femoral jig in easily, and we felt that we had a good tibia cuts, removed the tibia pins.  We then placed an intramedullary guide through the notch of the femur and made a line down the middle of the femur to set our femoral cutting guide.  We drilled appropriate holes for this, and then we were able to use our 0 Miller. Once we did this and placed the small femoral trial, we placed the size C tibia, and then felt like with our flexion gap, we had a nice size 3 tightness.  We went up to the femur and we were able to place 1  spoon with the knee in a slightly flexed position.  We felt like we should use a 2 Miller then to remove more distal femur.  We did this without difficulty, and we were pleased with our flexion and extension gaps. After that using a size 3 and a size 4, we then did our femoral finishing cut and removing osteophytes proximally and our final femoral preparation to get back down the tibia.  We then put our size C tibia, and we drilled for the keel for this, and with the trial C tibia and trial small femur, we trialed a 4 mm trial insert, and we were pleased with range of motion and stability.  We then removed all trial components and irrigated the knee with normal saline solution.  We placed small drill holes in the femur, then mixed our cement, we cemented the tibial tray size C from the [employer] unicompartmental knee system and a small femur.  We temporarily placed 4 insert.  Once the cement had been hardened, we removed cement debris, then placed the real mobile-bearing 4 polyethylene insert 4 mm thickness.  We were then pleased with range of motion and stability of the knee.  We then irrigated the knee with normal saline solution using pulsatile lavage. We closed the arthrotomy with an interrupted #1 Vicryl suture followed by 0 Vicryl in the deep tissue, 2-0 Vicryl in the subcutaneous tissue, 4- 0 Monocryl subcuticular stitch, and Steri-Strips on the skin.  A well- padded sterile dressing was applied, and she was taken off the table and taken to the recovery room in stable condition.  All final counts were correct.  There were no complications noted.  Of note, Richardean CanalGilbert Clark, PA-C, assisted in the entire case.  His assistance was crucial for facilitating all aspects of this case.     Vanita Pandahristopher Y. Magnus IvanBlackman, M.D.     CYB/MEDQ  D:  01/06/2016  T:  01/07/2016  Job:  [id number]

## 2016-01-07 NOTE — Care Management Obs Status (Signed)
MEDICARE OBSERVATION STATUS NOTIFICATION   Patient Details  Name: Kristi NgHettie Graven MRN: [id number] Date of Birth: 06/05/1941   Medicare Observation Status Notification Given:  Yes    Durenda GuthrieBrady, Shaylyn Bawa Naomi, RN 01/07/2016, 10:51 AM

## 2016-01-07 NOTE — Evaluation (Signed)
Physical Therapy Evaluation Patient Details Name: Kristi David MRN: [id number] DOB: 08/09/1941 Today's Date: 01/07/2016   History of Present Illness  Admitted for R unicompartmental knee replacement;  has a past medical history of Syncope; Hypertension; Shortness of breath dyspnea; Seasonal allergies; Diabetes mellitus without complication (HCC); GERD (gastroesophageal reflux disease); and Arthritis.  Clinical Impression   Patient is s/p above surgery resulting in functional limitations due to the deficits listed below (see PT Problem List).  Overall moving well; dc home today is not unreasonable, although Kristi David is hesitant; Will see for second session and consider dc home today versus home tomorrow;  Patient will benefit from skilled PT to increase their independence and safety with mobility to allow discharge to the venue listed below.       Follow Up Recommendations Home health PT;Supervision/Assistance - 24 hour    Equipment Recommendations  Rolling walker with 5" wheels;3in1 (PT) (shower chair as well)    Recommendations for Other Services       Precautions / Restrictions Precautions Precautions: Knee Precaution Booklet Issued: Yes (comment) Precaution Comments: Pt educated to not allow any pillow or bolster under knee for healing with optimal range of motion.  Restrictions RLE Weight Bearing: Weight bearing as tolerated      Mobility  Bed Mobility Overal bed mobility: Needs Assistance Bed Mobility: Supine to Sit     Supine to sit: Min guard     General bed mobility comments: Cues for technqiue; used bedrails, no physical assist needed  Transfers Overall transfer level: Needs assistance Equipment used: Rolling walker (2 wheeled) Transfers: Sit to/from Stand Sit to Stand: Min guard (without physical contact)         General transfer comment: Cues for hand placement and technique; stood from bed, 3in1, and recliner without need for physical  assist  Ambulation/Gait Ambulation/Gait assistance: Min assist;Min guard;Supervision Ambulation Distance (Feet): 100 Feet Assistive device: Rolling walker (2 wheeled) Gait Pattern/deviations: Step-through pattern     General Gait Details: Cues to activate R quad for stance stability; cues also to self-monitor for activity tolerance  Stairs            Wheelchair Mobility    Modified Rankin (Stroke Patients Only)       Balance                                             Pertinent Vitals/Pain Pain Assessment: 0-10 Pain Score: 8  Pain Location: R knee Pain Descriptors / Indicators: Aching Pain Intervention(s): Monitored during session    Home Living Family/patient expects to be discharged to:: Private residence Living Arrangements: Alone Available Help at Discharge: Family;Available 24 hours/day (Daughter will stay with her first 2 weeks home) Type of Home: Apartment Home Access: Elevator     Home Layout: One level Home Equipment: None      Prior Function Level of Independence: Independent               Hand Dominance        Extremity/Trunk Assessment   Upper Extremity Assessment: Overall WFL for tasks assessed           Lower Extremity Assessment: RLE deficits/detail RLE Deficits / Details: Grossly decr AROM and strength, limited by pain       Communication   Communication: No difficulties  Cognition Arousal/Alertness: Awake/alert Behavior During Therapy: WFL for tasks assessed/performed Overall  Cognitive Status: Within Functional Limits for tasks assessed                      General Comments      Exercises Total Joint Exercises Ankle Circles/Pumps: AROM;Both;10 reps Quad Sets: AROM;Right;10 reps Heel Slides: AAROM;Right;10 reps Straight Leg Raises: AAROM;AROM;Right;10 reps Goniometric ROM: approx 8-60deg      Assessment/Plan    PT Assessment Patient needs continued PT services  PT Diagnosis  Difficulty walking;Acute pain   PT Problem List Decreased strength;Decreased range of motion;Decreased activity tolerance;Decreased balance;Decreased mobility;Decreased coordination;Decreased knowledge of use of DME;Decreased knowledge of precautions;Pain  PT Treatment Interventions DME instruction;Gait training;Stair training;Functional mobility training;Therapeutic activities;Therapeutic exercise;Balance training;Patient/family education   PT Goals (Current goals can be found in the Care Plan section) Acute Rehab PT Goals Patient Stated Goal: to walk without pain PT Goal Formulation: With patient Time For Goal Achievement: 01/14/16 Potential to Achieve Goals: Good    Frequency 7X/week   Barriers to discharge   REports she has a very long hallway to get to her apartment    Co-evaluation               End of Session Equipment Utilized During Treatment: Gait belt Activity Tolerance: Patient tolerated treatment well Patient left: in chair;with call bell/phone within reach;with nursing/sitter in room Nurse Communication: Mobility status    Functional Assessment Tool Used: Clinical Judgement Functional Limitation: Mobility: Walking and moving around Mobility: Walking and Moving Around Current Status (Z6109): At least 1 percent but less than 20 percent impaired, limited or restricted Mobility: Walking and Moving Around Goal Status [phone]): 0 percent impaired, limited or restricted    Time: 0981-1914 PT Time Calculation (min) (ACUTE ONLY): 37 min   Charges:   PT Evaluation $PT Eval Low Complexity: 1 Procedure PT Treatments $Gait Training: 8-22 mins   PT G Codes:   PT G-Codes **NOT FOR INPATIENT CLASS** Functional Assessment Tool Used: Clinical Judgement Functional Limitation: Mobility: Walking and moving around Mobility: Walking and Moving Around Current Status (N8295): At least 1 percent but less than 20 percent impaired, limited or restricted Mobility: Walking and Moving  Around Goal Status [phone]): 0 percent impaired, limited or restricted    Van Clines [facility] 01/07/2016, 10:41 AM  Van Clines, PT  Acute Rehabilitation Services Pager [phone] Office [phone]

## 2016-01-07 NOTE — Progress Notes (Deleted)
Your procedure is listed as observation by medicare guidelines.

## 2016-01-08 DIAGNOSIS — M1711 Unilateral primary osteoarthritis, right knee: Secondary | ICD-10-CM | POA: Diagnosis not present

## 2016-01-08 DIAGNOSIS — E119 Type 2 diabetes mellitus without complications: Secondary | ICD-10-CM | POA: Diagnosis not present

## 2016-01-08 DIAGNOSIS — I10 Essential (primary) hypertension: Secondary | ICD-10-CM | POA: Diagnosis not present

## 2016-01-08 MED ORDER — METHOCARBAMOL 500 MG PO TABS: 500.0000 mg | ORAL_TABLET | Freq: Four times a day (QID) | ORAL | Status: DC | PRN

## 2016-01-08 MED ORDER — OXYCODONE HCL 5 MG PO TABS: 5.0000 mg | ORAL_TABLET | ORAL | Status: DC | PRN

## 2016-01-08 MED ORDER — ASPIRIN 325 MG PO TBEC: 325.0000 mg | DELAYED_RELEASE_TABLET | Freq: Two times a day (BID) | ORAL | Status: DC

## 2016-01-08 NOTE — Progress Notes (Signed)
Physical Therapy Treatment Patient Details Name: Kristi David MRN: [id number] DOB: 10/22/1940 Today's Date: 01/08/2016    History of Present Illness Admitted for R unicompartmental knee replacement;  has a past medical history of Syncope; Hypertension; Shortness of breath dyspnea; Seasonal allergies; Diabetes mellitus without complication (HCC); GERD (gastroesophageal reflux disease); and Arthritis.    PT Comments    Patient is making good progress with PT.  From a mobility standpoint anticipate patient will be ready for DC home when medically ready.     Follow Up Recommendations  Home health PT;Supervision/Assistance - 24 hour     Equipment Recommendations  Rolling walker with 5" wheels;3in1 (PT) (shower chair as well)    Recommendations for Other Services       Precautions / Restrictions Precautions Precautions: Knee Restrictions Weight Bearing Restrictions: Yes RLE Weight Bearing: Weight bearing as tolerated    Mobility  Bed Mobility Overal bed mobility: Needs Assistance Bed Mobility: Supine to Sit     Supine to sit: Supervision     General bed mobility comments: supervision for safety; HOB flat and no use of bed rails; increased time needed  Transfers Overall transfer level: Needs assistance Equipment used: Rolling walker (2 wheeled) Transfers: Sit to/from Stand Sit to Stand: Supervision         General transfer comment: from EOB and 3 in 1; supervision for safety; safe hand placement and technique  Ambulation/Gait Ambulation/Gait assistance: Supervision Ambulation Distance (Feet): 150 Feet Assistive device: Rolling walker (2 wheeled) Gait Pattern/deviations: Step-to pattern;Step-through pattern;Decreased stance time - right;Decreased step length - left;Decreased weight shift to right;Antalgic;Trunk flexed   Gait velocity interpretation: Below normal speed for age/gender General Gait Details: cues for posture, sequencing of gait with use of AD, and  position of RW; pt with improved step through pattern with increased distance; slow cadence   Stairs            Wheelchair Mobility    Modified Rankin (Stroke Patients Only)       Balance   Sitting-balance support: Feet supported;No upper extremity supported Sitting balance-Leahy Scale: Good     Standing balance support: No upper extremity supported;During functional activity Standing balance-Leahy Scale: Fair                      Cognition Arousal/Alertness: Awake/alert Behavior During Therapy: WFL for tasks assessed/performed Overall Cognitive Status: Within Functional Limits for tasks assessed                      Exercises      General Comments General comments (skin integrity, edema, etc.): given HEP hand out and reviewed frequency and use of ice and positioning      Pertinent Vitals/Pain Pain Assessment: Faces Faces Pain Scale: Hurts little more Pain Location: R knee with mobility Pain Descriptors / Indicators: Sore Pain Intervention(s): Limited activity within patient's tolerance;Monitored during session;Repositioned;Patient requesting pain meds-RN notified    Home Living                      Prior Function            PT Goals (current goals can now be found in the care plan section) Acute Rehab PT Goals Patient Stated Goal: to go home  PT Goal Formulation: With patient Time For Goal Achievement: 01/14/16 Potential to Achieve Goals: Good Progress towards PT goals: Progressing toward goals    Frequency  7X/week    PT Plan Current plan  remains appropriate    Co-evaluation             End of Session Equipment Utilized During Treatment: Gait belt Activity Tolerance: Patient tolerated treatment well Patient left: with call bell/phone within reach;in chair     Time: 1610-9604 PT Time Calculation (min) (ACUTE ONLY): 34 min  Charges:  $Gait Training: 8-22 mins $Therapeutic Activity: 8-22 mins                     G Codes:      Derek Mound, PTA Pager: [phone]   01/08/2016, 10:29 AM

## 2016-01-08 NOTE — Progress Notes (Signed)
Discharge instruction RX's follow up appt explain/provided to patient verbalized understanding. Home Health set up and contact information provided on d/c instructions, patient requested to take pain medication for the ride home. Patient left floor via wheelchair accompanied by staff.Dressing to right knee clean dry and intact.   Uziel Covault, Kae HellerMiranda Lynn, RN

## 2016-01-08 NOTE — Discharge Summary (Signed)
Patient ID: Kristi David MRN: [id number] DOB/AGE: [birth year] 75 y.o.  Admit date: 01/06/2016 Discharge date: 01/08/2016  Admission Diagnoses:  Principal Problem:   Osteoarthritis of right knee Active Problems:   Status post right partial knee replacement   Discharge Diagnoses:  Same  Past Medical History  Diagnosis Date  . Syncope     passed out and was hospitalized for a couple of days  . Hypertension   . Shortness of breath dyspnea     if too active  . Seasonal allergies   . GERD (gastroesophageal reflux disease)     no meds  . High cholesterol   . Type II diabetes mellitus (HCC)     does not take medicine diet controlled  . Anemia   . History of blood transfusion   . Arthritis     "legs, knees" (01/07/2016)    Surgeries: Procedure(s): RIGHT UNICOMPARTMENTAL KNEE ARTHROPLASTY on 01/06/2016   Consultants:    Discharged Condition: Improved  Hospital Course: Kristi David is an 75 y.o. female who was admitted 01/06/2016 for operative treatment ofOsteoarthritis of right knee. Patient has severe unremitting pain that affects sleep, daily activities, and work/hobbies. After pre-op clearance the patient was taken to the operating room on 01/06/2016 and underwent  Procedure(s): RIGHT UNICOMPARTMENTAL KNEE ARTHROPLASTY.    Patient was given perioperative antibiotics: Anti-infectives    Start     Dose/Rate Route Frequency Ordered Stop   01/06/16 2100  ceFAZolin (ANCEF) IVPB 1 g/50 mL premix     1 g 100 mL/hr over 30 Minutes Intravenous Every 6 hours 01/06/16 1838 01/07/16 0509   01/06/16 1430  ceFAZolin (ANCEF) IVPB 2g/100 mL premix     2 g 200 mL/hr over 30 Minutes Intravenous To ShortStay Surgical 01/05/16 1121 01/06/16 1545       Patient was given sequential compression devices, early ambulation, and chemoprophylaxis to prevent DVT.  Patient benefited maximally from hospital stay and there were no complications.    Recent vital signs: Patient Vitals for the past 24  hrs:  BP Temp Temp src Pulse Resp SpO2  01/08/16 0440 (!) 158/75 mmHg 98.5 F (36.9 C) - 72 18 97 %  01/07/16 2007 (!) 143/56 mmHg 99.1 F (37.3 C) - 73 18 97 %  01/07/16 1300 (!) 145/62 mmHg 98.6 F (37 C) - 63 18 97 %  01/07/16 1024 (!) 151/80 mmHg - - 62 - -  01/07/16 0646 (!) 127/53 mmHg 98.5 F (36.9 C) Oral 68 18 95 %     Recent laboratory studies: No results for input(s): WBC, HGB, HCT, PLT, NA, K, CL, CO2, BUN, CREATININE, GLUCOSE, INR, CALCIUM in the last 72 hours.  Invalid input(s): PT, 2   Discharge Medications:     Medication List    STOP taking these medications        meloxicam 15 MG tablet  Commonly known as:  MOBIC     traMADol 50 MG tablet  Commonly known as:  ULTRAM      TAKE these medications        aspirin 325 MG EC tablet  Take 1 tablet (325 mg total) by mouth 2 (two) times daily after a meal.     HORIZANT 600 MG Tbcr  Generic drug:  Gabapentin Enacarbil  Take 600 mg by mouth daily.     losartan 50 MG tablet  Commonly known as:  COZAAR  Take 50 mg by mouth daily.     methocarbamol 500 MG tablet  Commonly known as:  ROBAXIN  Take 1 tablet (500 mg total) by mouth every 6 (six) hours as needed for muscle spasms.     metoprolol succinate 25 MG 24 hr tablet  Commonly known as:  TOPROL-XL  Take 25 mg by mouth daily.     oxyCODONE 5 MG immediate release tablet  Commonly known as:  Oxy IR/ROXICODONE  Take 1-2 tablets (5-10 mg total) by mouth every 4 (four) hours as needed for severe pain or breakthrough pain.        Diagnostic Studies: Dg Knee Right Port  01/06/2016  CLINICAL DATA:  Right unicompartmental knee arthroplasty EXAM: PORTABLE RIGHT KNEE - 1-2 VIEW COMPARISON:  None. FINDINGS: There is a right medial femorotibial compartment hemiarthroplasty without failure complication. Mild osteoarthritis of the lateral femorotibial compartment. Moderate osteoarthritis of the patellofemoral compartment. Large joint effusion. Air within the joint  fluid and surrounding soft tissues consistent with postsurgical changes. IMPRESSION: Right medial femorotibial compartment hemiarthroplasty without failure complication. Electronically Signed   By: Elige KoHetal  Patel   On: 01/06/2016 19:56    Disposition: to home      Discharge Instructions    Discharge patient    Complete by:  As directed            Follow-up Information    [facility]   Why:  [facility] will contact you concerning start date and time for therapy.   Contact information:   [address] [place] [address] [phone]       Follow up with Kathryne HitchBLACKMAN,Darcus Edds Y, MD In 2 weeks.   Specialty:  Orthopedic Surgery   Contact information:   [address] [place] [address] [phone]        Signed: Kathryne HitchBLACKMAN,Traevon Meiring Y 01/08/2016, 6:40 AM

## 2016-01-08 NOTE — Discharge Instructions (Signed)

## 2016-01-08 NOTE — Progress Notes (Signed)
Subjective: 2 Days Post-Op Procedure(s) (LRB): RIGHT UNICOMPARTMENTAL KNEE ARTHROPLASTY (Right) Patient reports pain as mild.    Objective: Vital signs in last 24 hours: Temp:  [98.5 F (36.9 C)-99.1 F (37.3 C)] 98.5 F (36.9 C) (04/13 0440) Pulse Rate:  [62-73] 72 (04/13 0440) Resp:  [18] 18 (04/13 0440) BP: (127-158)/(53-80) 158/75 mmHg (04/13 0440) SpO2:  [95 %-97 %] 97 % (04/13 0440)  Intake/Output from previous day: 04/12 0701 - 04/13 0700 In: 480 [P.O.:480] Out: -  Intake/Output this shift:    No results for input(s): HGB in the last 72 hours. No results for input(s): WBC, RBC, HCT, PLT in the last 72 hours. No results for input(s): NA, K, CL, CO2, BUN, CREATININE, GLUCOSE, CALCIUM in the last 72 hours. No results for input(s): LABPT, INR in the last 72 hours.  Sensation intact distally Intact pulses distally Dorsiflexion/Plantar flexion intact Incision: dressing C/D/I No cellulitis present Compartment soft  Assessment/Plan: 2 Days Post-Op Procedure(s) (LRB): RIGHT UNICOMPARTMENTAL KNEE ARTHROPLASTY (Right) Up with therapy Discharge home with home health today.  Kathryne HitchBLACKMAN,Mailen Newborn Y 01/08/2016, 6:38 AM

## 2016-01-09 DIAGNOSIS — I10 Essential (primary) hypertension: Secondary | ICD-10-CM | POA: Diagnosis not present

## 2016-01-09 DIAGNOSIS — Z471 Aftercare following joint replacement surgery: Secondary | ICD-10-CM | POA: Diagnosis not present

## 2016-01-09 DIAGNOSIS — M1990 Unspecified osteoarthritis, unspecified site: Secondary | ICD-10-CM | POA: Diagnosis not present

## 2016-01-09 DIAGNOSIS — Z96651 Presence of right artificial knee joint: Secondary | ICD-10-CM | POA: Diagnosis not present

## 2016-01-09 DIAGNOSIS — E119 Type 2 diabetes mellitus without complications: Secondary | ICD-10-CM | POA: Diagnosis not present

## 2016-01-12 DIAGNOSIS — I10 Essential (primary) hypertension: Secondary | ICD-10-CM | POA: Diagnosis not present

## 2016-01-12 DIAGNOSIS — M1990 Unspecified osteoarthritis, unspecified site: Secondary | ICD-10-CM | POA: Diagnosis not present

## 2016-01-12 DIAGNOSIS — E119 Type 2 diabetes mellitus without complications: Secondary | ICD-10-CM | POA: Diagnosis not present

## 2016-01-12 DIAGNOSIS — Z471 Aftercare following joint replacement surgery: Secondary | ICD-10-CM | POA: Diagnosis not present

## 2016-01-12 DIAGNOSIS — Z96651 Presence of right artificial knee joint: Secondary | ICD-10-CM | POA: Diagnosis not present

## 2016-01-14 ENCOUNTER — Other Ambulatory Visit (HOSPITAL_COMMUNITY): Payer: Self-pay | Admitting: Orthopaedic Surgery

## 2016-01-14 DIAGNOSIS — N3281 Overactive bladder: Secondary | ICD-10-CM | POA: Diagnosis not present

## 2016-01-14 DIAGNOSIS — Z96651 Presence of right artificial knee joint: Secondary | ICD-10-CM | POA: Diagnosis not present

## 2016-01-14 DIAGNOSIS — E119 Type 2 diabetes mellitus without complications: Secondary | ICD-10-CM | POA: Diagnosis not present

## 2016-01-14 DIAGNOSIS — M79661 Pain in right lower leg: Secondary | ICD-10-CM

## 2016-01-14 DIAGNOSIS — I10 Essential (primary) hypertension: Secondary | ICD-10-CM | POA: Diagnosis not present

## 2016-01-14 DIAGNOSIS — M1990 Unspecified osteoarthritis, unspecified site: Secondary | ICD-10-CM | POA: Diagnosis not present

## 2016-01-14 DIAGNOSIS — E7800 Pure hypercholesterolemia, unspecified: Secondary | ICD-10-CM | POA: Diagnosis not present

## 2016-01-14 DIAGNOSIS — E669 Obesity, unspecified: Secondary | ICD-10-CM | POA: Diagnosis not present

## 2016-01-14 DIAGNOSIS — Z6832 Body mass index (BMI) 32.0-32.9, adult: Secondary | ICD-10-CM | POA: Diagnosis not present

## 2016-01-14 DIAGNOSIS — F419 Anxiety disorder, unspecified: Secondary | ICD-10-CM | POA: Diagnosis not present

## 2016-01-14 DIAGNOSIS — M7989 Other specified soft tissue disorders: Principal | ICD-10-CM

## 2016-01-14 DIAGNOSIS — Z471 Aftercare following joint replacement surgery: Secondary | ICD-10-CM | POA: Diagnosis not present

## 2016-01-15 ENCOUNTER — Ambulatory Visit (HOSPITAL_COMMUNITY)
Admission: RE | Admit: 2016-01-15 | Discharge: 2016-01-15 | Disposition: A | Discharge status: Home / Self Care | Payer: Medicare Other | Source: Ambulatory Visit | Attending: Family Medicine | Admitting: Family Medicine

## 2016-01-15 DIAGNOSIS — M79661 Pain in right lower leg: Secondary | ICD-10-CM

## 2016-01-15 DIAGNOSIS — M79604 Pain in right leg: Secondary | ICD-10-CM | POA: Diagnosis not present

## 2016-01-15 DIAGNOSIS — M7989 Other specified soft tissue disorders: Secondary | ICD-10-CM | POA: Diagnosis not present

## 2016-01-15 NOTE — Progress Notes (Signed)
VASCULAR LAB PRELIMINARY  PRELIMINARY  PRELIMINARY  PRELIMINARY  Right lower extremity venous duplex completed.     Right:  No evidence of DVT, superficial thrombosis, or Baker's cyst.  Called Doctor's office left message with the results @10 :53 am.  Jenetta Logesami Shamell Hittle, RVT, RDMS 01/15/2016, 10:55 AM

## 2016-01-16 DIAGNOSIS — E119 Type 2 diabetes mellitus without complications: Secondary | ICD-10-CM | POA: Diagnosis not present

## 2016-01-16 DIAGNOSIS — M1990 Unspecified osteoarthritis, unspecified site: Secondary | ICD-10-CM | POA: Diagnosis not present

## 2016-01-16 DIAGNOSIS — Z96651 Presence of right artificial knee joint: Secondary | ICD-10-CM | POA: Diagnosis not present

## 2016-01-16 DIAGNOSIS — I10 Essential (primary) hypertension: Secondary | ICD-10-CM | POA: Diagnosis not present

## 2016-01-16 DIAGNOSIS — Z471 Aftercare following joint replacement surgery: Secondary | ICD-10-CM | POA: Diagnosis not present

## 2016-01-19 DIAGNOSIS — M1711 Unilateral primary osteoarthritis, right knee: Secondary | ICD-10-CM | POA: Diagnosis not present

## 2016-01-20 DIAGNOSIS — E119 Type 2 diabetes mellitus without complications: Secondary | ICD-10-CM | POA: Diagnosis not present

## 2016-01-20 DIAGNOSIS — Z96651 Presence of right artificial knee joint: Secondary | ICD-10-CM | POA: Diagnosis not present

## 2016-01-20 DIAGNOSIS — M1990 Unspecified osteoarthritis, unspecified site: Secondary | ICD-10-CM | POA: Diagnosis not present

## 2016-01-20 DIAGNOSIS — Z471 Aftercare following joint replacement surgery: Secondary | ICD-10-CM | POA: Diagnosis not present

## 2016-01-20 DIAGNOSIS — I10 Essential (primary) hypertension: Secondary | ICD-10-CM | POA: Diagnosis not present

## 2016-01-22 DIAGNOSIS — M1990 Unspecified osteoarthritis, unspecified site: Secondary | ICD-10-CM | POA: Diagnosis not present

## 2016-01-22 DIAGNOSIS — I10 Essential (primary) hypertension: Secondary | ICD-10-CM | POA: Diagnosis not present

## 2016-01-22 DIAGNOSIS — E119 Type 2 diabetes mellitus without complications: Secondary | ICD-10-CM | POA: Diagnosis not present

## 2016-01-22 DIAGNOSIS — Z96651 Presence of right artificial knee joint: Secondary | ICD-10-CM | POA: Diagnosis not present

## 2016-01-22 DIAGNOSIS — Z471 Aftercare following joint replacement surgery: Secondary | ICD-10-CM | POA: Diagnosis not present

## 2016-02-03 ENCOUNTER — Encounter: Payer: Self-pay | Admitting: Physical Therapy

## 2016-02-03 ENCOUNTER — Ambulatory Visit: Payer: Medicare Other | Attending: Orthopaedic Surgery | Admitting: Physical Therapy

## 2016-02-03 DIAGNOSIS — M6281 Muscle weakness (generalized): Secondary | ICD-10-CM | POA: Diagnosis not present

## 2016-02-03 DIAGNOSIS — R600 Localized edema: Secondary | ICD-10-CM

## 2016-02-03 DIAGNOSIS — M25561 Pain in right knee: Secondary | ICD-10-CM | POA: Diagnosis not present

## 2016-02-03 DIAGNOSIS — R2689 Other abnormalities of gait and mobility: Secondary | ICD-10-CM | POA: Diagnosis not present

## 2016-02-03 NOTE — Patient Instructions (Signed)
Scar Massage  Scar massage is done to improve the mobility of scar, decrease scar tissue from building up, reduce adhesions, and prevent Keloids from forming. Start scar massage after scabs have fallen off by themselves and no open areas. The first few weeks after surgery, it is normal for a scar to appear pink or red and slightly raised. Scars can itch or have areas of numbness. Some scars may be sensitive.   Direct Scar massage: after scar is healed, no opening, no scab 1.  Place pads of two fingers together directly on the scar starting at one end of the scar. Move the fingers up and down across the scar holding 5 seconds one direction.  Then go opposite direction hold 5 seconds.  2. Move over to the next section of the scar and repeat.  Work your way along the entire length of the scar.   3. Next make diagonal movements along the scar holding 5 seconds at one direction. 4. Next movement is side to side. 5. Do not rub fingers over the scar.  Instead keep firm pressure and move scar over the tissue it is on top   Scar Lift and Roll 12 weeks after surgery. 1. Pinch a small amount of the scar between your first two fingers and thumb.  2. Roll the scar between your fingers for 5 to 15 seconds. 3. Move along the scar and repeat until you have massaged the entire length of scar.   Stop the massage and call your doctor if you notice: 1. Increased redness 2. Bleeding from scar 3. Seepage coming from the scar 4. Scar is warmer and has increased pain   [facility] [address], [address] [place], [place] [address] Phone # [phone] Fax [phone]  

## 2016-02-03 NOTE — Therapy (Signed)
[facility] [address] [place], [place], [address] Phone: [phone]   Fax:  [phone]  Physical Therapy Evaluation  Patient Details  Name: Kristi David MRN: [id number] Date of Birth: [birth year] Referring Provider: Dr. Carolynn Comment  Encounter Date: 02/03/2016      PT End of Session - 02/03/16 1253    Visit Number 1   Number of Visits 10   Date for PT Re-Evaluation 03/30/16   Authorization Type [employer] medicare   PT Start Time 1230   PT Stop Time 1320   PT Time Calculation (min) 50 min   Activity Tolerance Patient tolerated treatment well   Behavior During Therapy [facility] for tasks assessed/performed      Past Medical History  Diagnosis Date  . Syncope     passed out and was hospitalized for a couple of days  . Hypertension   . Shortness of breath dyspnea     if too active  . Seasonal allergies   . GERD (gastroesophageal reflux disease)     no meds  . High cholesterol   . Type II diabetes mellitus (HCC)     does not take medicine diet controlled  . Anemia   . History of blood transfusion   . Arthritis     "legs, knees" (01/07/2016)    Past Surgical History  Procedure Laterality Date  . Vein ligation and stripping Bilateral   . Mr lower leg left ([facility] hx) Left     after a break  . Knee arthroscopy with excision baker's cyst Right   . Bunionectomy Bilateral   . Partial knee arthroplasty Right 01/06/2016    Procedure: RIGHT UNICOMPARTMENTAL KNEE ARTHROPLASTY;  Surgeon: Kathryne Hitch, MD;  Location: [facility] OR;  Service: Orthopedics;  Laterality: Right;  . Inguinal hernia repair Right   . Cataract extraction w/ intraocular lens  implant, bilateral Bilateral     There were no vitals filed for this visit.       Subjective Assessment - 02/03/16 1233    Subjective TKA on right on 01/06/2016.  In hospital for 2 days.  Had home health physical therapy for 2 weeks.  Patient was instructed on how  to use a SPC from home PT.    Patient Stated Goals bend right knee, walking,    Currently in Pain? Yes   Pain Score 5    Pain Location Knee   Pain Orientation Right   Pain Descriptors / Indicators Sore   Pain Type Surgical pain   Pain Onset 1 to 4 weeks ago   Pain Frequency Intermittent   Aggravating Factors  too much walking   Pain Relieving Factors rest            [facility] PT Assessment - 02/03/16 0001    Assessment   Medical Diagnosis S/P right unilateal knee arhroplasty   Referring Provider Dr. Carolynn Comment   Onset Date/Surgical Date 01/06/16   Prior Therapy home health PT   Precautions   Precautions None   Restrictions   Weight Bearing Restrictions No   Balance Screen   Has the patient fallen in the past 6 months No   Has the patient had a decrease in activity level because of a fear of falling?  No   Is the patient reluctant to leave their home because of a fear of falling?  No   Home Environment   Living Environment Assisted living   Prior Function   Level of Independence Independent  Leisure exercise   Cognition   Overall Cognitive Status Within Functional Limits for tasks assessed   Observation/Other Assessments   Focus on Therapeutic Outcomes (FOTO)  65% limitation CL  45% limitatiom   Observation/Other Assessments-Edema    Edema Circumferential   Circumferential Edema   Circumferential - Right 47 cm   ROM / Strength   AROM / PROM / Strength AROM;PROM;Strength   AROM   AROM Assessment Site Knee   Right/Left Knee Right   Right Knee Extension -10  sit   Right Knee Flexion 90  sit   PROM   PROM Assessment Site Knee   Right/Left Knee Right   Right Knee Extension -5   Right Knee Flexion 95   Strength   Strength Assessment Site Knee   Right Hip Flexion 3+/5   Right Hip Extension 4/5   Right Hip ABduction 3+/5   Right/Left Knee Right   Right Knee Flexion 3+/5   Right Knee Extension 3+/5   Flexibility   Soft Tissue Assessment /Muscle Length  yes   Hamstrings tight on right   Quadriceps tight on right   Ambulation/Gait   Ambulation/Gait Yes   Assistive device Straight cane   Gait Pattern Decreased hip/knee flexion - right;Decreased stance time - right;Decreased dorsiflexion - right;Decreased weight shift to right   Stairs Yes   Stair Management Technique Step to pattern                   [facility] Adult PT Treatment/Exercise - 02/03/16 0001    Exercises   Exercises Knee/Hip   Knee/Hip Exercises: Aerobic   Recumbent Bike 5 min. level 1 arm #12   Modalities   Modalities Vasopneumatic   Vasopneumatic   Number Minutes Vasopneumatic  15 minutes   Vasopnuematic Location  Knee  right   Vasopneumatic Pressure Medium   Vasopneumatic Temperature  3 snowflakes                PT Education - 02/03/16 1316    Education provided Yes   Education Details scar massage   Person(s) Educated Patient   Methods Explanation;Demonstration;Verbal cues;Handout   Comprehension Returned demonstration;Verbalized understanding          PT Short Term Goals - 02/03/16 1300    PT SHORT TERM GOAL #1   Title independent with initial HEP    Time 4   Period Weeks   Status New   PT SHORT TERM GOAL #2   Title ambulate in home without assistive device due to decreased pain and increased mobility   Time 4   Period Weeks   Status New   PT SHORT TERM GOAL #3   Title right knee flexion PROM >/= 105 degrees   Time 4   Period Weeks   Status New   PT SHORT TERM GOAL #4   Title right knee extension PROM 0 degrees   Time 4   Period Weeks   Status New   PT SHORT TERM GOAL #5   Title pain in right knee with daily activities decreased >/= 75%   Time 8   Period Weeks   Status New           PT Long Term Goals - 02/03/16 1254    PT LONG TERM GOAL #1   Title independent with HEP   Time 8   Period Weeks   Status New   PT LONG TERM GOAL #2   Title ability to ambulate without an assistive  device due  to increased right  knee strength >/= 4+/5   Time 8   Period Weeks   Status New   PT LONG TERM GOAL #3   Title get in and out of the car with greater ease due to increased right knee flexion >/= 110 AROM   Time 8   Period Weeks   Status New   PT LONG TERM GOAL #4   Title go up and down steps with a step over step pattern due to incresaed right knee flexion >/= 110 degrees   Time 8   Period Weeks   Status New   PT LONG TERM GOAL #5   Title return to exercise at the gym in complex and understand how to correct correctly   Time 8   Period Weeks   Status New               Plan - Mar 04, 2016 1303    Clinical Impression Statement Patient is a 75 year old female with diagnosis of right unilateral TKR on 01/06/2016.  Patient was in the hospital for 2 days and had 2 weeks of Home health PT.  Patient reports her pain level is  5/10 intermittently with too much activity.  Patient has decreased mobility of right patella and scar.  Patient has increased edema of righ tknee.  She ambulates iwth limp on the right decreased right knee flexion and extension and decreased right ankle dorsiflexion with a single point cane.  Patient requires verbal cues to place cane inleft hand instead of the right. Patient has decreased  right knee ROM  and strength.  Patient  has a tight right hamstring and quadricep decreasing movility.  Patient will go up and down steps with step to step pattern.  Patient is of low complexity.  Patient will benefit form pphysical therapy to imporve right knee ROM and strength to imporve gait.    Rehab Potential Excellent   Clinical Impairments Affecting Rehab Potential None   PT Frequency 2x / week   PT Duration 8 weeks   PT Treatment/Interventions ADLs/Self Care Home Management;Cryotherapy;Electrical Stimulation;Ultrasound;Moist Heat;Gait training;Stair training;Functional mobility training;Therapeutic activities;Therapeutic exercise;Balance training;Patient/family education;Neuromuscular  re-education;Manual techniques;Scar mobilization;Vasopneumatic Device;Passive range of motion   PT Next Visit Plan scar mobilization, soft tissue work, right knee ROM and strength, right hip strength, stretch of right hamstring and quads   PT Home Exercise Plan progress as tolerated   Recommended Other Services none   Consulted and Agree with Plan of Care Patient      Patient will benefit from skilled therapeutic intervention in order to improve the following deficits and impairments:  Abnormal gait, Increased fascial restricitons, Pain, Decreased scar mobility, Decreased mobility, Decreased activity tolerance, Decreased endurance, Decreased range of motion, Decreased strength, Impaired flexibility, Increased edema, Decreased balance  Visit Diagnosis: Muscle weakness (generalized) - Plan: PT plan of care cert/re-cert  Localized edema - Plan: PT plan of care cert/re-cert  Pain in right knee - Plan: PT plan of care cert/re-cert  Other abnormalities of gait and mobility - Plan: PT plan of care cert/re-cert      G-Codes - 03-04-2016 1317    Functional Assessment Tool Used FOTO score 63% limitation   Functional Limitation Mobility: Walking and moving around   Mobility: Walking and Moving Around Current Status (Y7829) At least 60 percent but less than 80 percent impaired, limited or restricted   Mobility: Walking and Moving Around Goal Status (F6213) At least 40 percent but less than 60 percent impaired, limited or restricted  Problem List Patient Active Problem List   Diagnosis Date Noted  . Osteoarthritis of right knee 01/06/2016  . Status post right partial knee replacement 01/06/2016    Kristi David, PT 02/03/2016 1:20 PM   [facility] [address] [place], [place], [address] Phone: [phone]   Fax:  [phone]  Name: Kristi David MRN: [id number] Date of Birth: 02/11/1941

## 2016-02-05 ENCOUNTER — Encounter: Payer: Medicare Other | Admitting: Physical Therapy

## 2016-02-10 ENCOUNTER — Encounter: Payer: Self-pay | Admitting: Physical Therapy

## 2016-02-10 ENCOUNTER — Ambulatory Visit: Payer: Medicare Other | Admitting: Physical Therapy

## 2016-02-10 DIAGNOSIS — M25561 Pain in right knee: Secondary | ICD-10-CM

## 2016-02-10 DIAGNOSIS — R2689 Other abnormalities of gait and mobility: Secondary | ICD-10-CM

## 2016-02-10 DIAGNOSIS — R600 Localized edema: Secondary | ICD-10-CM | POA: Diagnosis not present

## 2016-02-10 DIAGNOSIS — M6281 Muscle weakness (generalized): Secondary | ICD-10-CM | POA: Diagnosis not present

## 2016-02-10 NOTE — Patient Instructions (Signed)
Leg Extension (Hamstring)   Sit toward front edge of chair, with leg out straight, heel on floor, toes pointing toward body. Keeping back straight, bend forward at hip, breathing out through pursed lips. Return, breathing in. Repeat _3__ times. Repeat with other leg. Do  2-3___ sessions per day.  Hamstring Step 1   Straighten left knee. Keep knee level with other knee or on bolster. Hold 20 seconds. Relax knee by returning foot to start. Repeat _3__ times.  Do __1-2__ sessions per day.  http://[email or web address].exer.us/279   Hamstring Stretch (Standing)   This one should be practiced a t stairs, if you do not have stairs use a chair, but make sure you are able to hold on.  Standing, place one heel on chair or bench. Keeping torso straight, lean forward slowly until a stretch is felt in back of same thigh. Hold __20__ seconds. Repeat 3 times  You can perform with other leg.   Hamstring Step 3   Left leg in maximal straight leg raise, heel at maximal stretch, straighten knee further by tightening knee cap. Warning: Intense stretch. Stay within tolerance. Hold ___ seconds. Relax knee cap only. Repeat ___ times.  Hamstring Step 4   Left leg and foot in maximal stretch. Slowly lengthen and press other leg down as close to floor as possible. Keep lower abdominals tight. Warning: Intense stretch. Stay within tolerance. Hold ___ seconds. Relax lengthened leg slightly. Do not re-bend knee. Repeat press and lengthen ___ times.  http://orth.exer.us/661   Copyright  VHI. All rights reserved.

## 2016-02-10 NOTE — Therapy (Signed)
[facility] [address], [address] [place], [place], [address] Phone: [phone]   Fax:  [phone]  Physical Therapy Treatment  Patient Details  Name: Kristi NgHettie David MRN: [id number] Date of Birth: 04/04/1941 Referring Provider: Dr. Carolynn Commenthristoher Blackman  Encounter Date: 02/10/2016      PT End of Session - 02/10/16 1542    Visit Number 2   Number of Visits 10   Date for PT Re-Evaluation 03/30/16   Authorization Type UHC medicare   PT Start Time 1528   PT Stop Time 1632   PT Time Calculation (min) 64 min   Activity Tolerance Patient tolerated treatment well   Behavior During Therapy [facility] for tasks assessed/performed      Past Medical History  Diagnosis Date  . Syncope     passed out and was hospitalized for a couple of days  . Hypertension   . Shortness of breath dyspnea     if too active  . Seasonal allergies   . GERD (gastroesophageal reflux disease)     no meds  . High cholesterol   . Type II diabetes mellitus (HCC)     does not take medicine diet controlled  . Anemia   . History of blood transfusion   . Arthritis     "legs, knees" (01/07/2016)    Past Surgical History  Procedure Laterality Date  . Vein ligation and stripping Bilateral   . Mr lower leg left ([facility] hx) Left     after a break  . Knee arthroscopy with excision baker's cyst Right   . Bunionectomy Bilateral   . Partial knee arthroplasty Right 01/06/2016    Procedure: RIGHT UNICOMPARTMENTAL KNEE ARTHROPLASTY;  Surgeon: Kathryne Hitchhristopher Y Blackman, MD;  [facility];  Service: Orthopedics;  Laterality: Right;  . Inguinal hernia repair Right   . Cataract extraction w/ intraocular lens  implant, bilateral Bilateral     There were no vitals filed for this visit.      Subjective Assessment - 02/10/16 1541    Subjective No c/o of pain only after increased activity level pain in Rt knee.    Pertinent History TKA Rt 01/06/2016.    Patient Stated Goals  bend right knee, walking,    Currently in Pain? No/denies                         [facility]/Exercise - 02/10/16 0001    Ambulation/Gait   Ambulation/Gait Yes   Ambulation Distance (Feet) 160 Feet  and all additional distance needed in gym   Assistive device Straight cane   Gait Pattern Decreased hip/knee flexion - right;Decreased stance time - right;Decreased dorsiflexion - right;Decreased weight shift to right   Exercises   Exercises Knee/Hip   Knee/Hip Exercises: Stretches   Active Hamstring Stretch Right;3 reps;20 seconds  on stairs & in sitting   Knee/Hip Exercises: Aerobic   Recumbent Bike 10min rocking level 1    Knee/Hip Exercises: Standing   Rebounder 3 direction x 1 min each with B UE support   Modalities   Modalities Vasopneumatic   Vasopneumatic   Number Minutes Vasopneumatic  15 minutes   Vasopnuematic Location  Knee   Vasopneumatic Pressure Medium   Vasopneumatic Temperature  3 snowflakes                PT Education - 02/10/16 1612    Education provided Yes   Education Details Hamstring stretch in sitting and supine  Person(s) Educated Patient   Methods Explanation;Demonstration;Handout   Comprehension Verbalized understanding;Returned demonstration          PT Short Term Goals - 02/10/16 1551    PT SHORT TERM GOAL #1   Title independent with initial HEP    Time 4   Period Weeks   Status On-going   PT SHORT TERM GOAL #2   Title ambulate in home without assistive device due to decreased pain and increased mobility   Time 4   Period Weeks   Status On-going   PT SHORT TERM GOAL #3   Title right knee flexion PROM >/= 105 degrees   Time 4   Period Weeks   Status On-going   PT SHORT TERM GOAL #4   Title right knee extension PROM 0 degrees   Time 4   Period Weeks   Status On-going   PT SHORT TERM GOAL #5   Title pain in right knee with daily activities decreased >/= 75%   Time 4   Period Weeks   Status  On-going           PT Long Term Goals - 02/03/16 1254    PT LONG TERM GOAL #1   Title independent with HEP   Time 8   Period Weeks   Status New   PT LONG TERM GOAL #2   Title ability to ambulate without an assistive  device due to increased right knee strength >/= 4+/5   Time 8   Period Weeks   Status New   PT LONG TERM GOAL #3   Title get in and out of the car with greater ease due to increased right knee flexion >/= 110 AROM   Time 8   Period Weeks   Status New   PT LONG TERM GOAL #4   Title go up and down steps with a step over step pattern due to incresaed right knee flexion >/= 110 degrees   Time 8   Period Weeks   Status New   PT LONG TERM GOAL #5   Title return to exercise at the gym in complex and understand how to correct correctly   Time 8   Period Weeks   Status New               Plan - 02/10/16 1546    Clinical Impression Statement Pt presents with well healing scar, swelling present and limited ROM initilally not able for full revolution on bike. Pt denies pain at this moment. Pt with tight hamstrings and quadriceps. Pt will benefit from skilled PT to iincrease Rt knee ROM and strength to improve gait.    Rehab Potential Excellent   Clinical Impairments Affecting Rehab Potential None   PT Frequency 2x / week   PT Duration 8 weeks   PT Next Visit Plan scar mobilization, soft tissue work, right knee ROM and strength, right hip strength, stretch of right hamstring and quads   PT Home Exercise Plan progress as tolerated   Consulted and Agree with Plan of Care Patient      Patient will benefit from skilled therapeutic intervention in order to improve the following deficits and impairments:     Visit Diagnosis: Muscle weakness (generalized)  Localized edema  Pain in right knee  Other abnormalities of gait and mobility     Problem List Patient Active Problem List   Diagnosis Date Noted  . Osteoarthritis of right knee 01/06/2016  . Status  post right partial knee replacement  01/06/2016    NAUMANN-HOUEGNIFIO,Missael Ferrari PTA 02/10/2016, 4:13 PM  [facility] [address] [place], [place], [address] Phone: [phone]   Fax:  [phone]  Name: Kristi David MRN: [id number] Date of Birth: April 22, 1941

## 2016-02-12 ENCOUNTER — Ambulatory Visit: Payer: Medicare Other | Admitting: Physical Therapy

## 2016-02-12 ENCOUNTER — Encounter: Payer: Self-pay | Admitting: Physical Therapy

## 2016-02-12 DIAGNOSIS — R2689 Other abnormalities of gait and mobility: Secondary | ICD-10-CM

## 2016-02-12 DIAGNOSIS — M25561 Pain in right knee: Secondary | ICD-10-CM

## 2016-02-12 DIAGNOSIS — M6281 Muscle weakness (generalized): Secondary | ICD-10-CM | POA: Diagnosis not present

## 2016-02-12 DIAGNOSIS — R600 Localized edema: Secondary | ICD-10-CM | POA: Diagnosis not present

## 2016-02-12 NOTE — Therapy (Signed)
[facility] [address] [place], [place], [address] Phone: [phone]   Fax:  [phone]  Physical Therapy Treatment  Patient Details  Name: Kristi David MRN: [id number] Date of Birth: [birth year] Referring Provider: Dr. Carolynn Comment  Encounter Date: 02/12/2016      PT End of Session - 02/12/16 1506    Visit Number 3   Number of Visits 10   Date for PT Re-Evaluation 03/30/16   Authorization Type U[employer] medicare   PT Start Time 1444   PT Stop Time 1546   PT Time Calculation (min) 62 min   Activity Tolerance Patient tolerated treatment well   Behavior During Therapy [facility] for tasks assessed/performed      Past Medical History  Diagnosis Date  . Syncope     passed out and was hospitalized for a couple of days  . Hypertension   . Shortness of breath dyspnea     if too active  . Seasonal allergies   . GERD (gastroesophageal reflux disease)     no meds  . High cholesterol   . Type II diabetes mellitus (HCC)     does not take medicine diet controlled  . Anemia   . History of blood transfusion   . Arthritis     "legs, knees" (01/07/2016)    Past Surgical History  Procedure Laterality Date  . Vein ligation and stripping Bilateral   . Mr lower leg left ([facility] hx) Left     after a break  . Knee arthroscopy with excision baker's cyst Right   . Bunionectomy Bilateral   . Partial knee arthroplasty Right 01/06/2016    Procedure: RIGHT UNICOMPARTMENTAL KNEE ARTHROPLASTY;  Surgeon: Kathryne Hitch, MD;  Location: [facility] OR;  Service: Orthopedics;  Laterality: Right;  . Inguinal hernia repair Right   . Cataract extraction w/ intraocular lens  implant, bilateral Bilateral     There were no vitals filed for this visit.      Subjective Assessment - 02/12/16 1503    Subjective No c/o of pain, but stiffness on posterior right leg   Patient Stated Goals bend right knee, walking,    Currently in Pain?  No/denies                         [facility] Adult PT Treatment/Exercise - 02/12/16 0001    Ambulation/Gait   Ambulation/Gait Yes   Ambulation Distance (Feet) 160 Feet   Assistive device Straight cane   Gait Pattern Decreased hip/knee flexion - right;Decreased stance time - right;Decreased dorsiflexion - right;Decreased weight shift to right   Exercises   Exercises Knee/Hip   Knee/Hip Exercises: Stretches   Active Hamstring Stretch Right;3 reps;20 seconds  on stairs   Quad Stretch 3 reps;20 seconds  on stairs   Knee/Hip Exercises: Aerobic   Recumbent Bike rocking level 1    Knee/Hip Exercises: Standing   Forward Step Up 2 sets;10 reps   Rebounder 3 direction x 1 min each with B UE support   Modalities   Modalities Vasopneumatic   Vasopneumatic   Number Minutes Vasopneumatic  15 minutes   Vasopnuematic Location  Knee   Vasopneumatic Pressure Medium   Vasopneumatic Temperature  3 snowflakes   Manual Therapy   Manual Therapy Soft tissue mobilization   Soft tissue mobilization to Right knee to release muscle tension in quadriceps and along incision side  PT Short Term Goals - 02/10/16 1551    PT SHORT TERM GOAL #1   Title independent with initial HEP    Time 4   Period Weeks   Status On-going   PT SHORT TERM GOAL #2   Title ambulate in home without assistive device due to decreased pain and increased mobility   Time 4   Period Weeks   Status On-going   PT SHORT TERM GOAL #3   Title right knee flexion PROM >/= 105 degrees   Time 4   Period Weeks   Status On-going   PT SHORT TERM GOAL #4   Title right knee extension PROM 0 degrees   Time 4   Period Weeks   Status On-going   PT SHORT TERM GOAL #5   Title pain in right knee with daily activities decreased >/= 75%   Time 4   Period Weeks   Status On-going           PT Long Term Goals - 02/03/16 1254    PT LONG TERM GOAL #1   Title independent with HEP   Time 8    Period Weeks   Status New   PT LONG TERM GOAL #2   Title ability to ambulate without an assistive  device due to increased right knee strength >/= 4+/5   Time 8   Period Weeks   Status New   PT LONG TERM GOAL #3   Title get in and out of the car with greater ease due to increased right knee flexion >/= 110 AROM   Time 8   Period Weeks   Status New   PT LONG TERM GOAL #4   Title go up and down steps with a step over step pattern due to incresaed right knee flexion >/= 110 degrees   Time 8   Period Weeks   Status New   PT LONG TERM GOAL #5   Title return to exercise at the gym in complex and understand how to correct correctly   Time 8   Period Weeks   Status New               Plan - 02/12/16 1509    Clinical Impression Statement Pt with improvement in sequencing using cane in left hand to support Rt leg. Pt is challenged with steppping up forward with Rt LE due to weakness. Pt will continue to benefit from skilled PT to improve ROM, advance strength, endurance and gait.    Rehab Potential Excellent   Clinical Impairments Affecting Rehab Potential None   PT Frequency 2x / week   PT Duration 8 weeks   PT Treatment/Interventions ADLs/Self Care Home Management;Cryotherapy;Electrical Stimulation;Ultrasound;Moist Heat;Gait training;Stair training;Functional mobility training;Therapeutic activities;Therapeutic exercise;Balance training;Patient/family education;Neuromuscular re-education;Manual techniques;Scar mobilization;Vasopneumatic Device;Passive range of motion   PT Next Visit Plan scar mobilization, stairs step up's, soft tissue work, right knee ROM and strength, right hip strength, stretch of right hamstring and quads   PT Home Exercise Plan progress as tolerated   Consulted and Agree with Plan of Care Patient      Patient will benefit from skilled therapeutic intervention in order to improve the following deficits and impairments:  Abnormal gait, Increased fascial  restricitons, Pain, Decreased scar mobility, Decreased mobility, Decreased activity tolerance, Decreased endurance, Decreased range of motion, Decreased strength, Impaired flexibility, Increased edema, Decreased balance  Visit Diagnosis: Muscle weakness (generalized)  Localized edema  Pain in right knee  Other abnormalities of gait and mobility  Problem List Patient Active Problem List   Diagnosis Date Noted  . Osteoarthritis of right knee 01/06/2016  . Status post right partial knee replacement 01/06/2016    NAUMANN-HOUEGNIFIO,Ashantia Amaral PTA 02/12/2016, 3:32 PM  [facility] [address], [address] [place], [place], [address] Phone: [phone]   Fax:  [phone]  Name: Kristi David MRN: [id number] Date of Birth: April 11, 1941

## 2016-02-16 ENCOUNTER — Encounter: Payer: Self-pay | Admitting: Physical Therapy

## 2016-02-16 ENCOUNTER — Ambulatory Visit: Payer: Medicare Other | Admitting: Physical Therapy

## 2016-02-16 DIAGNOSIS — M25561 Pain in right knee: Secondary | ICD-10-CM

## 2016-02-16 DIAGNOSIS — R600 Localized edema: Secondary | ICD-10-CM | POA: Diagnosis not present

## 2016-02-16 DIAGNOSIS — R2689 Other abnormalities of gait and mobility: Secondary | ICD-10-CM | POA: Diagnosis not present

## 2016-02-16 DIAGNOSIS — M6281 Muscle weakness (generalized): Secondary | ICD-10-CM

## 2016-02-16 NOTE — Therapy (Signed)
[facility] [address], [address] [place], [place], [address] Phone: [phone]   Fax:  [phone]  Physical Therapy Treatment  Patient Details  Name: Kristi David MRN: [id number] Date of Birth: May 24, 1941 Referring Provider: Dr. Carolynn Comment  Encounter Date: 02/16/2016      PT End of Session - 02/16/16 1453    Visit Number 4   Number of Visits 10   Date for PT Re-Evaluation 03/30/16   Authorization Type UHC medicare   PT Start Time 1449   PT Stop Time 1548   PT Time Calculation (min) 59 min   Activity Tolerance Patient tolerated treatment well   Behavior During Therapy [facility] for tasks assessed/performed      Past Medical History  Diagnosis Date  . Syncope     passed out and was hospitalized for a couple of days  . Hypertension   . Shortness of breath dyspnea     if too active  . Seasonal allergies   . GERD (gastroesophageal reflux disease)     no meds  . High cholesterol   . Type II diabetes mellitus (HCC)     does not take medicine diet controlled  . Anemia   . History of blood transfusion   . Arthritis     "legs, knees" (01/07/2016)    Past Surgical History  Procedure Laterality Date  . Vein ligation and stripping Bilateral   . Mr lower leg left ([facility] hx) Left     after a break  . Knee arthroscopy with excision baker's cyst Right   . Bunionectomy Bilateral   . Partial knee arthroplasty Right 01/06/2016    Procedure: RIGHT UNICOMPARTMENTAL KNEE ARTHROPLASTY;  Surgeon: Kathryne Hitch, MD;  Location: [facility] OR;  Service: Orthopedics;  Laterality: Right;  . Inguinal hernia repair Right   . Cataract extraction w/ intraocular lens  implant, bilateral Bilateral     There were no vitals filed for this visit.      Subjective Assessment - 02/16/16 1452    Subjective No c/o of pain, but continues to expierience stiffness on posterior right leg   Pertinent History TKA Rt 01/06/2016.    Patient  Stated Goals bend right knee, walking,    Currently in Pain? No/denies                         [facility] Adult PT Treatment/Exercise - 02/16/16 0001    Exercises   Exercises Knee/Hip   Knee/Hip Exercises: Stretches   Active Hamstring Stretch Right;3 reps;20 seconds  on stairs   Quad Stretch 3 reps;20 seconds  on stairs   Knee/Hip Exercises: Aerobic   Recumbent Bike rocking level 1   last 2 min able for full revolution backwards than forward   Knee/Hip Exercises: Standing   Forward Step Up 2 sets;10 reps   Rebounder 3 direction x 1 min each with B UE support   Modalities   Modalities Vasopneumatic   Vasopneumatic   Number Minutes Vasopneumatic  15 minutes   Vasopnuematic Location  Knee   Vasopneumatic Pressure Medium   Vasopneumatic Temperature  3 snowflakes   Manual Therapy   Manual Therapy Soft tissue mobilization   Soft tissue mobilization to Right knee to release muscle tension in quadriceps and along incision side                PT Education - 02/16/16 1521    Education provided Yes   Education Details  step's forward, lat and tapping down   Person(s) Educated Patient   Methods Explanation;Demonstration;Handout   Comprehension Verbalized understanding;Returned demonstration          PT Short Term Goals - 02/16/16 1458    PT SHORT TERM GOAL #1   Title independent with initial HEP    Time 4   Period Weeks   Status On-going   PT SHORT TERM GOAL #2   Title ambulate in home without assistive device due to decreased pain and increased mobility   Time 4   Period Weeks   Status On-going   PT SHORT TERM GOAL #3   Title right knee flexion PROM >/= 105 degrees   Time 4   Period Weeks   Status On-going   PT SHORT TERM GOAL #4   Title right knee extension PROM 0 degrees   Time 4   Period Weeks   Status On-going   PT SHORT TERM GOAL #5   Title pain in right knee with daily activities decreased >/= 75%   Time 4   Period Weeks   Status  On-going           PT Long Term Goals - 02/03/16 1254    PT LONG TERM GOAL #1   Title independent with HEP   Time 8   Period Weeks   Status New   PT LONG TERM GOAL #2   Title ability to ambulate without an assistive  device due to increased right knee strength >/= 4+/5   Time 8   Period Weeks   Status New   PT LONG TERM GOAL #3   Title get in and out of the car with greater ease due to increased right knee flexion >/= 110 AROM   Time 8   Period Weeks   Status New   PT LONG TERM GOAL #4   Title go up and down steps with a step over step pattern due to incresaed right knee flexion >/= 110 degrees   Time 8   Period Weeks   Status New   PT LONG TERM GOAL #5   Title return to exercise at the gym in complex and understand how to correct correctly   Time 8   Period Weeks   Status New               Plan - 02/16/16 1453    Clinical Impression Statement After prolonged sitting increased stiffnes in Rt knee. Pt with decreased ROM right knee unable for full revolution on stationary bike. Pt will continue to benefit from skilled PT to improve ROM, advance strength, endurance and giat.   Rehab Potential Excellent   Clinical Impairments Affecting Rehab Potential None   PT Frequency 2x / week   PT Duration 8 weeks   PT Treatment/Interventions ADLs/Self Care Home Management;Cryotherapy;Electrical Stimulation;Ultrasound;Moist Heat;Gait training;Stair training;Functional mobility training;Therapeutic activities;Therapeutic exercise;Balance training;Patient/family education;Neuromuscular re-education;Manual techniques;Scar mobilization;Vasopneumatic Device;Passive range of motion   PT Next Visit Plan scar mobilization, stairs step up's, soft tissue work, right knee ROM and strength, right hip strength, stretch of right hamstring and quads   PT Home Exercise Plan progress as tolerated   Consulted and Agree with Plan of Care Patient      Patient will benefit from skilled therapeutic  intervention in order to improve the following deficits and impairments:     Visit Diagnosis: Muscle weakness (generalized)  Localized edema  Pain in right knee  Other abnormalities of gait and mobility     Problem  List Patient Active Problem List   Diagnosis Date Noted  . Osteoarthritis of right knee 01/06/2016  . Status post right partial knee replacement 01/06/2016    NAUMANN-HOUEGNIFIO,Tabitha Tupper PTA 02/16/2016, 3:33 PM  [facility] [address], [address] [place], [place], [address] Phone: [phone]   Fax:  [phone]  Name: Luci Bellucci MRN: [id number] Date of Birth: 1941/07/16

## 2016-02-16 NOTE — Patient Instructions (Signed)
   Step-Down / Step-Up    Stand on stair step Stepping up with right leg,  Repeat _3__ times per set. Do _2___ sets per session. Do __2 sessions per day.  http://orth.exer.us/684   Copyright  VHI. All rights reserved.  Anterior Step-Down    Stand with both feet on _6 inch step. Step down in A direction with RIGHT foot only touching heel to the floor and return 10 times. __2_ sets 2-3___ times per day.  http://gglj.exer.us/185   Copyright  VHI. All rights reserved.  Lateral Step-Up    Step up with leg. Step onto step with right foot, facing forward, and without pushing off with the ground foot.  _10x      2 sets _ 2  times per day.  http://gglj.exer.us/167   Copyright  VHI. All rights reserved.  [facility] [address], [address] [place], [place] [address] Phone # [phone] Fax [phone]

## 2016-02-18 ENCOUNTER — Ambulatory Visit: Payer: Medicare Other

## 2016-02-18 DIAGNOSIS — R2689 Other abnormalities of gait and mobility: Secondary | ICD-10-CM

## 2016-02-18 DIAGNOSIS — R600 Localized edema: Secondary | ICD-10-CM | POA: Diagnosis not present

## 2016-02-18 DIAGNOSIS — M25561 Pain in right knee: Secondary | ICD-10-CM

## 2016-02-18 DIAGNOSIS — M6281 Muscle weakness (generalized): Secondary | ICD-10-CM

## 2016-02-18 NOTE — Therapy (Signed)
[facility] [address], [address] [place], [place], [address] Phone: [phone]   Fax:  [phone]  Physical Therapy Treatment  Patient Details  Name: Kristi David MRN: [id number] Date of Birth: [birth year] Referring Provider: Dr. Carolynn Comment  Encounter Date: 02/18/2016      PT End of Session - 02/18/16 1513    Visit Number 5   Number of Visits 10   Date for PT Re-Evaluation 03/30/16   Authorization Type [employer] medicare   PT Start Time 1435   PT Stop Time 1533   PT Time Calculation (min) 58 min   Activity Tolerance Patient tolerated treatment well   Behavior During Therapy [facility] for tasks assessed/performed      Past Medical History  Diagnosis Date  . Syncope     passed out and was hospitalized for a couple of days  . Hypertension   . Shortness of breath dyspnea     if too active  . Seasonal allergies   . GERD (gastroesophageal reflux disease)     no meds  . High cholesterol   . Type II diabetes mellitus (HCC)     does not take medicine diet controlled  . Anemia   . History of blood transfusion   . Arthritis     "legs, knees" (01/07/2016)    Past Surgical History  Procedure Laterality Date  . Vein ligation and stripping Bilateral   . Mr lower leg left ([facility] hx) Left     after a break  . Knee arthroscopy with excision baker's cyst Right   . Bunionectomy Bilateral   . Partial knee arthroplasty Right 01/06/2016    Procedure: RIGHT UNICOMPARTMENTAL KNEE ARTHROPLASTY;  Surgeon: Kathryne Hitch, MD;  Location: [facility];  Service: Orthopedics;  Laterality: Right;  . Inguinal hernia repair Right   . Cataract extraction w/ intraocular lens  implant, bilateral Bilateral     There were no vitals filed for this visit.      Subjective Assessment - 02/18/16 1441    Subjective Knee is feeling pretty good today.     Pertinent History TKA Rt 01/06/2016.    Patient Stated Goals bend right knee, walking,    Currently in Pain? Yes   Pain Score 5    Pain Location Knee   Pain Orientation Right   Pain Descriptors / Indicators Sore;Tightness   Pain Type Surgical pain   Pain Onset More than a month ago   Pain Frequency Intermittent   Aggravating Factors  when it is stiff, sitting too long   Pain Relieving Factors rest, ice.            [facility] PT Assessment - 02/18/16 0001    Assessment   Medical Diagnosis S/P right unilateal knee arhroplasty   Onset Date/Surgical Date 01/06/16   Cognition   Overall Cognitive Status Within Functional Limits for tasks assessed   Observation/Other Assessments-Edema    Edema Circumferential   Circumferential Edema   Circumferential - Right 46 cm   ROM / Strength   AROM / PROM / Strength AROM;PROM   AROM   AROM Assessment Site Knee   Right/Left Knee Right   Right Knee Extension -8   Right Knee Flexion 92   Strength   Right/Left Knee Right   Right Knee Flexion 4/5   Right Knee Extension 4+/5                     [facility] Adult PT Treatment/Exercise - 02/18/16  0001    Knee/Hip Exercises: Stretches   Active Hamstring Stretch Right;3 reps;20 seconds  on stairs   Quad Stretch 3 reps;20 seconds  on stairs   Knee/Hip Exercises: Aerobic   Recumbent Bike 10min rocking level 1   last 2 min able for full revolution backwards than forward   Nustep Level 2 x 8 minutes  seat 9, arms 9   Knee/Hip Exercises: Standing   Forward Step Up 2 sets;10 reps;Right;Step Height: 6";Hand Hold: 2  verbal cues to reduce use of arms   Rebounder 3 direction x 1 min each with B UE support   Knee/Hip Exercises: Seated   Long Arc Quad Strengthening;Right;2 sets;10 reps   Long Arc Quad Weight 2 lbs.   Knee/Hip Exercises: Supine   Heel Slides AAROM;Right;1 set;10 reps   Modalities   Modalities Vasopneumatic   Vasopneumatic   Number Minutes Vasopneumatic  15 minutes   Vasopnuematic Location  Knee   Vasopneumatic Pressure Medium   Vasopneumatic Temperature  3  snowflakes   Manual Therapy   Manual Therapy --   Manual therapy comments --   Soft tissue mobilization --                  PT Short Term Goals - 02/18/16 1443    PT SHORT TERM GOAL #1   Title independent with initial HEP    Status Achieved   PT SHORT TERM GOAL #2   Title ambulate in home without assistive device due to decreased pain and increased mobility   Status Achieved   PT SHORT TERM GOAL #3   Title right knee flexion PROM >/= 105 degrees   Time 4   Period Weeks   Status On-going   PT SHORT TERM GOAL #5   Title pain in right knee with daily activities decreased >/= 75%   Time 4   Period Weeks   Status On-going           PT Long Term Goals - 02/03/16 1254    PT LONG TERM GOAL #1   Title independent with HEP   Time 8   Period Weeks   Status New   PT LONG TERM GOAL #2   Title ability to ambulate without an assistive  device due to increased right knee strength >/= 4+/5   Time 8   Period Weeks   Status New   PT LONG TERM GOAL #3   Title get in and out of the car with greater ease due to increased right knee flexion >/= 110 AROM   Time 8   Period Weeks   Status New   PT LONG TERM GOAL #4   Title go up and down steps with a step over step pattern due to incresaed right knee flexion >/= 110 degrees   Time 8   Period Weeks   Status New   PT LONG TERM GOAL #5   Title return to exercise at the gym in complex and understand how to correct correctly   Time 8   Period Weeks   Status New               Plan - 02/18/16 1446    Clinical Impression Statement Pt with Rt knee AROM and strength deficits and increased edema s/p partial knee replacement 01/06/16. Rt knee AROM is 8-92 degrees.  Pt ambulates in the house without device.  Pt with mild antalgia on level surface.  Pt will continue to benefit from skilled PT for Rt  knee AROM, strength and edema/pain managment to improve mobility and endurance.   Rehab Potential Excellent   PT Frequency 2x /  week   PT Duration 8 weeks   PT Treatment/Interventions ADLs/Self Care Home Management;Cryotherapy;Electrical Stimulation;Ultrasound;Moist Heat;Gait training;Stair training;Functional mobility training;Therapeutic activities;Therapeutic exercise;Balance training;Patient/family education;Neuromuscular re-education;Manual techniques;Scar mobilization;Vasopneumatic Device;Passive range of motion   PT Next Visit Plan scar mobilization, stairs step up's, soft tissue work, right knee ROM and strength, right hip strength, stretch of right hamstring and quads   Consulted and Agree with Plan of Care Patient      Patient will benefit from skilled therapeutic intervention in order to improve the following deficits and impairments:  Abnormal gait, Increased fascial restricitons, Pain, Decreased scar mobility, Decreased mobility, Decreased activity tolerance, Decreased endurance, Decreased range of motion, Decreased strength, Impaired flexibility, Increased edema, Decreased balance  Visit Diagnosis: Muscle weakness (generalized)  Localized edema  Pain in right knee  Other abnormalities of gait and mobility     Problem List Patient Active Problem List   Diagnosis Date Noted  . Osteoarthritis of right knee 01/06/2016  . Status post right partial knee replacement 01/06/2016     Lorrene Reid, PT 02/18/2016 3:15 PM  [facility] [address] [place], [place], [address] Phone: [phone]   Fax:  [phone]  Name: Kristi David MRN: [id number] Date of Birth: 1941-02-20

## 2016-02-24 ENCOUNTER — Ambulatory Visit: Payer: Medicare Other

## 2016-02-24 DIAGNOSIS — M6281 Muscle weakness (generalized): Secondary | ICD-10-CM

## 2016-02-24 DIAGNOSIS — M25561 Pain in right knee: Secondary | ICD-10-CM | POA: Diagnosis not present

## 2016-02-24 DIAGNOSIS — R600 Localized edema: Secondary | ICD-10-CM | POA: Diagnosis not present

## 2016-02-24 DIAGNOSIS — R2689 Other abnormalities of gait and mobility: Secondary | ICD-10-CM

## 2016-02-24 NOTE — Therapy (Signed)
[facility] [address], [address] [place], [place], [address] Phone: [phone]   Fax:  [phone]  Physical Therapy Treatment  Patient Details  Name: Kristi NgHettie David MRN: [id number] Date of Birth: 04/17/1941 Referring Provider: Dr. Carolynn Commenthristoher Blackman  Encounter Date: 02/24/2016      PT End of Session - 02/24/16 1438    Visit Number 6   Number of Visits 10   Date for PT Re-Evaluation 03/30/16   PT Start Time 1400   PT Stop Time 1459   PT Time Calculation (min) 59 min   Activity Tolerance Patient tolerated treatment well   Behavior During Therapy [facility] for tasks assessed/performed      Past Medical History  Diagnosis Date  . Syncope     passed out and was hospitalized for a couple of days  . Hypertension   . Shortness of breath dyspnea     if too active  . Seasonal allergies   . GERD (gastroesophageal reflux disease)     no meds  . High cholesterol   . Type II diabetes mellitus (HCC)     does not take medicine diet controlled  . Anemia   . History of blood transfusion   . Arthritis     "legs, knees" (01/07/2016)    Past Surgical History  Procedure Laterality Date  . Vein ligation and stripping Bilateral   . Mr lower leg left ([facility] hx) Left     after a break  . Knee arthroscopy with excision baker's cyst Right   . Bunionectomy Bilateral   . Partial knee arthroplasty Right 01/06/2016    Procedure: RIGHT UNICOMPARTMENTAL KNEE ARTHROPLASTY;  Surgeon: Kathryne Hitchhristopher Y Blackman, MD;  [facility];  Service: Orthopedics;  Laterality: Right;  . Inguinal hernia repair Right   . Cataract extraction w/ intraocular lens  implant, bilateral Bilateral     There were no vitals filed for this visit.      Subjective Assessment - 02/24/16 1409    Subjective Saw the MD today.  He is pleased with her progress   Pertinent History Partial knee replacement Rt 01/06/2016.    Currently in Pain? Yes   Pain Score 4    Pain  Location Knee   Pain Orientation Right   Pain Descriptors / Indicators Sore;Tightness   Pain Type Surgical pain   Pain Onset More than a month ago   Pain Frequency Intermittent   Aggravating Factors  when it is stiff, sitting too long, walking/standing too long   Pain Relieving Factors rest, ice                         [facility] Adult PT Treatment/Exercise - 02/24/16 0001    Knee/Hip Exercises: Stretches   Active Hamstring Stretch Right;3 reps;20 seconds  on stairs   Quad Stretch 3 reps;20 seconds  on stairs   Knee/Hip Exercises: Aerobic   Recumbent Bike 10min rocking level 1   last 2 min able for full revolution backwards than forward   Nustep Level 2 x 8 minutes  seat 9, arms 9   Knee/Hip Exercises: Machines for Strengthening   Total Gym Leg Press bil. 60# 2x10  seat 7   Knee/Hip Exercises: Standing   Forward Step Up 2 sets;10 reps;Right;Step Height: 6";Hand Hold: 2  verbal cues for technique to imrpove mechanics   Rebounder 3 direction x 1 min each with B UE support   Knee/Hip Exercises: Seated   Long Arc  Quad Strengthening;Right;2 sets;10 reps   [facility] 2 lbs.   Knee/Hip Exercises: Supine   Heel Slides AAROM;Right;1 set;10 reps   Modalities   Modalities Vasopneumatic   Vasopneumatic   Number Minutes Vasopneumatic  15 minutes   Vasopnuematic Location  Knee   Vasopneumatic Pressure Medium   Vasopneumatic Temperature  3 snowflakes                  PT Short Term Goals - 02/24/16 1403    PT SHORT TERM GOAL #1   Title independent with initial HEP    Status Achieved   PT SHORT TERM GOAL #2   Title ambulate in home without assistive device due to decreased pain and increased mobility   Status Achieved   PT SHORT TERM GOAL #3   Title right knee flexion PROM >/= 105 degrees   Status Achieved   PT SHORT TERM GOAL #4   Title right knee extension PROM 0 degrees   Time 4   Period Weeks   Status On-going   PT SHORT TERM GOAL #5    Title pain in right knee with daily activities decreased >/= 75%   Time 4   Period Weeks   Status On-going           PT Long Term Goals - 02/24/16 1403    PT LONG TERM GOAL #1   Title independent with HEP   Time 8   Period Weeks   Status On-going   PT LONG TERM GOAL #3   Title get in and out of the car with greater ease due to increased right knee flexion >/= 110 AROM   Time 8   Period Weeks   Status On-going               Plan - 02/24/16 1404    Clinical Impression Statement Pt with Rt knee AROM and strength deficits and increased edema s/p partial knee replacement 01/06/16.  Rt knee AROM was 8-92 degrees last session.  Pt uses cane for community ambulation as needed.  Mild antalgia on level surfaces.  Pt will continue to benefit from skilled PT for Rt knee AROM, strength, edema/pain management and to improve mobility and endurance.     Rehab Potential Excellent   PT Frequency 2x / week   PT Duration 8 weeks   PT Treatment/Interventions ADLs/Self Care Home Management;Cryotherapy;Electrical Stimulation;Ultrasound;Moist Heat;Gait training;Stair training;Functional mobility training;Therapeutic activities;Therapeutic exercise;Balance training;Patient/family education;Neuromuscular re-education;Manual techniques;Scar mobilization;Vasopneumatic Device;Passive range of motion   PT Next Visit Plan scar mobilization, stairs step up's, soft tissue work, right knee ROM and strength, right hip strength.  Edema management   Consulted and Agree with Plan of Care Patient      Patient will benefit from skilled therapeutic intervention in order to improve the following deficits and impairments:  Abnormal gait, Increased fascial restricitons, Pain, Decreased scar mobility, Decreased mobility, Decreased activity tolerance, Decreased endurance, Decreased range of motion, Decreased strength, Impaired flexibility, Increased edema, Decreased balance  Visit Diagnosis: Muscle weakness  (generalized)  Localized edema  Pain in right knee  Other abnormalities of gait and mobility     Problem List Patient Active Problem List   Diagnosis Date Noted  . Osteoarthritis of right knee 01/06/2016  . Status post right partial knee replacement 01/06/2016     Lorrene Reid, PT 02/24/2016 2:39 PM  [facility] [address], [address] [place], [place], [address] Phone: [phone]   Fax:  [phone]  Name: Tejasvi Brissett MRN: [id number] Date of Birth: 05-Sep-1941

## 2016-02-26 ENCOUNTER — Ambulatory Visit: Payer: Medicare Other | Attending: Orthopaedic Surgery

## 2016-02-26 DIAGNOSIS — R2689 Other abnormalities of gait and mobility: Secondary | ICD-10-CM | POA: Diagnosis not present

## 2016-02-26 DIAGNOSIS — M6281 Muscle weakness (generalized): Secondary | ICD-10-CM | POA: Insufficient documentation

## 2016-02-26 DIAGNOSIS — M25561 Pain in right knee: Secondary | ICD-10-CM | POA: Diagnosis not present

## 2016-02-26 DIAGNOSIS — R600 Localized edema: Secondary | ICD-10-CM | POA: Insufficient documentation

## 2016-02-26 NOTE — Therapy (Signed)
[facility] [address], [address] [place], [place], [address] Phone: [phone]   Fax:  [phone]  Physical Therapy Treatment  Patient Details  Name: Kristi NgHettie David MRN: [id number] Date of Birth: 08/03/1941 Referring Provider: Dr. Carolynn Commenthristoher Blackman  Encounter Date: 02/26/2016      PT End of Session - 02/26/16 1525    Visit Number 7   Number of Visits 10   Date for PT Re-Evaluation 03/30/16   Authorization Type [employer] medicare   PT Start Time 1449   PT Stop Time 1542   PT Time Calculation (min) 53 min   Activity Tolerance Patient tolerated treatment well   Behavior During Therapy [facility] for tasks assessed/performed      Past Medical History  Diagnosis Date  . Syncope     passed out and was hospitalized for a couple of days  . Hypertension   . Shortness of breath dyspnea     if too active  . Seasonal allergies   . GERD (gastroesophageal reflux disease)     no meds  . High cholesterol   . Type II diabetes mellitus (HCC)     does not take medicine diet controlled  . Anemia   . History of blood transfusion   . Arthritis     "legs, knees" (01/07/2016)    Past Surgical History  Procedure Laterality Date  . Vein ligation and stripping Bilateral   . Mr lower leg left ([facility] hx) Left     after a break  . Knee arthroscopy with excision baker's cyst Right   . Bunionectomy Bilateral   . Partial knee arthroplasty Right 01/06/2016    Procedure: RIGHT UNICOMPARTMENTAL KNEE ARTHROPLASTY;  Surgeon: Kathryne Hitchhristopher Y Blackman, MD;  [facility];  Service: Orthopedics;  Laterality: Right;  . Inguinal hernia repair Right   . Cataract extraction w/ intraocular lens  implant, bilateral Bilateral     There were no vitals filed for this visit.      Subjective Assessment - 02/26/16 1456    Subjective Doing well.     Currently in Pain? Yes   Pain Score 2    Pain Location Knee   Pain Orientation Right   Pain Descriptors /  Indicators Sore;Tightness   Pain Type Surgical pain   Pain Onset More than a month ago   Pain Frequency Intermittent                         [facility] Adult PT Treatment/Exercise - 02/26/16 0001    Knee/Hip Exercises: Stretches   Quad Stretch 3 reps;20 seconds  on stairs   Knee/Hip Exercises: Aerobic   Recumbent Bike 6 min rocking level 1   last 2 min able for full revolution backwards than forward   Nustep Level 2 x 8 minutes  seat 9, arms 9   Knee/Hip Exercises: Machines for Strengthening   Total Gym Leg Press bil. 60# 2x10, 70# 1x10  seat 7   Knee/Hip Exercises: Standing   Forward Step Up 2 sets;10 reps;Right;Step Height: 6";Hand Hold: 2   Rebounder 3 direction x 1 min each with B UE support   Modalities   Modalities Vasopneumatic   Vasopneumatic   Number Minutes Vasopneumatic  15 minutes   Vasopnuematic Location  Knee   Vasopneumatic Pressure Medium   Vasopneumatic Temperature  3 snowflakes   Manual Therapy   Manual Therapy Soft tissue mobilization   Soft tissue mobilization to Right knee to release muscle  tension in quadriceps and along incision side                  PT Short Term Goals - 02/24/16 1403    PT SHORT TERM GOAL #1   Title independent with initial HEP    Status Achieved   PT SHORT TERM GOAL #2   Title ambulate in home without assistive device due to decreased pain and increased mobility   Status Achieved   PT SHORT TERM GOAL #3   Title right knee flexion PROM >/= 105 degrees   Status Achieved   PT SHORT TERM GOAL #4   Title right knee extension PROM 0 degrees   Time 4   Period Weeks   Status On-going   PT SHORT TERM GOAL #5   Title pain in right knee with daily activities decreased >/= 75%   Time 4   Period Weeks   Status On-going           PT Long Term Goals - 02/24/16 1403    PT LONG TERM GOAL #1   Title independent with HEP   Time 8   Period Weeks   Status On-going   PT LONG TERM GOAL #3   Title get in and  out of the car with greater ease due to increased right knee flexion >/= 110 AROM   Time 8   Period Weeks   Status On-going               Plan - 02/26/16 1457    Clinical Impression Statement Pt with Rt knee AROM and strength deficits and increased edema s/p partial knee replacement.  Pt uses cane for community ambulation as needed and demonstrates mild antalgia on level surfaces.  Pt tolerated  leg press this week. Pt with improved techinque with step-ups today.   Pt will continue to benefit from skilled PT for Rt knee AROM, strength, edema/pain management and to improve mobilty and endurance.     Rehab Potential Excellent   PT Frequency 2x / week   PT Duration 8 weeks   PT Treatment/Interventions ADLs/Self Care Home Management;Cryotherapy;Electrical Stimulation;Ultrasound;Moist Heat;Gait training;Stair training;Functional mobility training;Therapeutic activities;Therapeutic exercise;Balance training;Patient/family education;Neuromuscular re-education;Manual techniques;Scar mobilization;Vasopneumatic Device;Passive range of motion   PT Next Visit Plan scar mobilization, stairs step up's, soft tissue work, right knee ROM and strength, right hip strength.  Measure AROM.  Try single leg on leg press.   Consulted and Agree with Plan of Care Patient      Patient will benefit from skilled therapeutic intervention in order to improve the following deficits and impairments:  Abnormal gait, Increased fascial restricitons, Pain, Decreased scar mobility, Decreased mobility, Decreased activity tolerance, Decreased endurance, Decreased range of motion, Decreased strength, Impaired flexibility, Increased edema, Decreased balance  Visit Diagnosis: Muscle weakness (generalized)  Localized edema  Pain in right knee  Other abnormalities of gait and mobility     Problem List Patient Active Problem List   Diagnosis Date Noted  . Osteoarthritis of right knee 01/06/2016  . Status post right  partial knee replacement 01/06/2016     Lorrene Reid, PT 02/26/2016 3:27 PM  [facility] [address] [place], [place], [address] Phone: [phone]   Fax:  [phone]  Name: Kristi David MRN: [id number] Date of Birth: 1940-12-14

## 2016-03-02 ENCOUNTER — Ambulatory Visit: Payer: Medicare Other | Admitting: Physical Therapy

## 2016-03-02 DIAGNOSIS — R2689 Other abnormalities of gait and mobility: Secondary | ICD-10-CM | POA: Diagnosis not present

## 2016-03-02 DIAGNOSIS — R600 Localized edema: Secondary | ICD-10-CM

## 2016-03-02 DIAGNOSIS — M25561 Pain in right knee: Secondary | ICD-10-CM | POA: Diagnosis not present

## 2016-03-02 DIAGNOSIS — M6281 Muscle weakness (generalized): Secondary | ICD-10-CM | POA: Diagnosis not present

## 2016-03-02 NOTE — Therapy (Signed)
[facility] [address] [place], [place], [address] Phone: [phone]   Fax:  [phone]  Physical Therapy Treatment  Patient Details  Name: Kristi David MRN: [id number] Date of Birth: [birth year] Referring Provider: Dr. Carolynn Comment  Encounter Date: 03/02/2016      PT End of Session - 03/02/16 1518    Visit Number 8   Number of Visits 10   Date for PT Re-Evaluation 03/30/16   Authorization Type UHC medicare   PT Start Time 1445   PT Stop Time 1530   PT Time Calculation (min) 45 min   Activity Tolerance Patient tolerated treatment well   Behavior During Therapy [facility] for tasks assessed/performed      Past Medical History  Diagnosis Date  . Syncope     passed out and was hospitalized for a couple of days  . Hypertension   . Shortness of breath dyspnea     if too active  . Seasonal allergies   . GERD (gastroesophageal reflux disease)     no meds  . High cholesterol   . Type II diabetes mellitus (HCC)     does not take medicine diet controlled  . Anemia   . History of blood transfusion   . Arthritis     "legs, knees" (01/07/2016)    Past Surgical History  Procedure Laterality Date  . Vein ligation and stripping Bilateral   . Mr lower leg left ([facility] hx) Left     after a break  . Knee arthroscopy with excision baker's cyst Right   . Bunionectomy Bilateral   . Partial knee arthroplasty Right 01/06/2016    Procedure: RIGHT UNICOMPARTMENTAL KNEE ARTHROPLASTY;  Surgeon: Kathryne Hitch, MD;  Location: [facility] OR;  Service: Orthopedics;  Laterality: Right;  . Inguinal hernia repair Right   . Cataract extraction w/ intraocular lens  implant, bilateral Bilateral     There were no vitals filed for this visit.      Subjective Assessment - 03/02/16 1455    Subjective I can walk without the cane pretty good and depends on day. Some days the knee is stiff and some days it is not.    Pertinent History  Partial knee replacement Rt 01/06/2016.    Patient Stated Goals bend right knee, walking,    Currently in Pain? No/denies            [facility] PT Assessment - 03/02/16 0001    PROM   Right Knee Extension 0   Right Knee Flexion 100                     [facility] Adult PT Treatment/Exercise - 03/02/16 0001    Knee/Hip Exercises: Aerobic   Recumbent Bike 6 min rocking level 1   last 2 min able for full revolution backwards than forward   Knee/Hip Exercises: Machines for Strengthening   Total Gym Leg Press bil. 70# 2x10, 75# 1x10  seat 7   Knee/Hip Exercises: Standing   Forward Step Up 2 sets;10 reps;Right;Step Height: 6";Hand Hold: 2   Rebounder 3 direction x 1 min each with B UE support   Modalities   Modalities Vasopneumatic   Vasopneumatic   Number Minutes Vasopneumatic  15 minutes   Vasopnuematic Location  Knee   Vasopneumatic Pressure Medium   Vasopneumatic Temperature  3 snowflakes   Manual Therapy   Manual Therapy Soft tissue mobilization   Soft tissue mobilization to Right knee to release muscle tension  in quadriceps and along incision side                PT Education - 03/02/16 1517    Education provided No          PT Short Term Goals - 03/02/16 1523    PT SHORT TERM GOAL #4   Title right knee extension PROM 0 degrees   Time 4   Period Weeks   Status Achieved   PT SHORT TERM GOAL #5   Title pain in right knee with daily activities decreased >/= 75%   Time 4   Period Weeks   Status Achieved           PT Long Term Goals - 02/24/16 1403    PT LONG TERM GOAL #1   Title independent with HEP   Time 8   Period Weeks   Status On-going   PT LONG TERM GOAL #3   Title get in and out of the car with greater ease due to increased right knee flexion >/= 110 AROM   Time 8   Period Weeks   Status On-going               Plan - 03/02/16 1521    Clinical Impression Statement Patient right knee flexion has improved to 100 degrees and  extension is 0 degrees.  Patient continues to have weakness in right knee. Patient needs to work on right knee flexion.  Patient has moved up in weight for leg press.  Patient is unable to go fully around forward while on the recumbent bike.  Patient will benefit from physical therapy to improve right knee strength and ROM.    Rehab Potential Excellent   Clinical Impairments Affecting Rehab Potential None   PT Frequency 2x / week   PT Duration 8 weeks   PT Next Visit Plan work on right knee flexion, single leg on leg press   PT Home Exercise Plan progress as tolerated   Consulted and Agree with Plan of Care Patient      Patient will benefit from skilled therapeutic intervention in order to improve the following deficits and impairments:  Abnormal gait, Increased fascial restricitons, Pain, Decreased scar mobility, Decreased mobility, Decreased activity tolerance, Decreased endurance, Decreased range of motion, Decreased strength, Impaired flexibility, Increased edema, Decreased balance  Visit Diagnosis: Muscle weakness (generalized)  Localized edema  Pain in right knee  Other abnormalities of gait and mobility     Problem List Patient Active Problem List   Diagnosis Date Noted  . Osteoarthritis of right knee 01/06/2016  . Status post right partial knee replacement 01/06/2016    Eulis Fosterheryl Olan Kurek, PT 03/02/2016 3:25 PM    [facility] [address], [address] [place], [place], [address] Phone: [phone]   Fax:  [phone]  Name: Jossie NgHettie Fleet MRN: [id number] Date of Birth: 11/02/1940

## 2016-03-04 ENCOUNTER — Ambulatory Visit: Payer: Medicare Other | Admitting: Physical Therapy

## 2016-03-04 DIAGNOSIS — M6281 Muscle weakness (generalized): Secondary | ICD-10-CM | POA: Diagnosis not present

## 2016-03-04 DIAGNOSIS — R2689 Other abnormalities of gait and mobility: Secondary | ICD-10-CM | POA: Diagnosis not present

## 2016-03-04 DIAGNOSIS — R600 Localized edema: Secondary | ICD-10-CM

## 2016-03-04 DIAGNOSIS — M25561 Pain in right knee: Secondary | ICD-10-CM | POA: Diagnosis not present

## 2016-03-04 NOTE — Therapy (Signed)
[facility] [address] [place], [place], [address] Phone: [phone]   Fax:  [phone]  Physical Therapy Treatment  Patient Details  Name: Renea Schoonmaker MRN: [id number] Date of Birth: 1940-10-21 Referring Provider: Dr. Carolynn Comment  Encounter Date: 03/04/2016      PT End of Session - 03/04/16 1458    Visit Number 9   Number of Visits 10   Date for PT Re-Evaluation 03/30/16   Authorization Type U[employer] medicare   PT Start Time 1448   PT Stop Time 1545   PT Time Calculation (min) 57 min   Activity Tolerance Patient tolerated treatment well      Past Medical History  Diagnosis Date  . Syncope     passed out and was hospitalized for a couple of days  . Hypertension   . Shortness of breath dyspnea     if too active  . Seasonal allergies   . GERD (gastroesophageal reflux disease)     no meds  . High cholesterol   . Type II diabetes mellitus (HCC)     does not take medicine diet controlled  . Anemia   . History of blood transfusion   . Arthritis     "legs, knees" (01/07/2016)    Past Surgical History  Procedure Laterality Date  . Vein ligation and stripping Bilateral   . Mr lower leg left ([facility] hx) Left     after a break  . Knee arthroscopy with excision baker's cyst Right   . Bunionectomy Bilateral   . Partial knee arthroplasty Right 01/06/2016    Procedure: RIGHT UNICOMPARTMENTAL KNEE ARTHROPLASTY;  Surgeon: Kathryne Hitch, MD;  Location: [facility] OR;  Service: Orthopedics;  Laterality: Right;  . Inguinal hernia repair Right   . Cataract extraction w/ intraocular lens  implant, bilateral Bilateral     There were no vitals filed for this visit.      Subjective Assessment - 03/04/16 1450    Subjective Patient states she is doing well.  No pain. Using her cane when out and about but not at home very much.     Currently in Pain? No/denies   Pain Score 0-No pain   Pain Location Knee   Pain  Orientation Right                         [facility] Adult PT Treatment/Exercise - 03/04/16 0001    Knee/Hip Exercises: Stretches   Other Knee/Hip Stretches knee flexion on second step on /off 2 min   Knee/Hip Exercises: Aerobic   Recumbent Bike 6 min; able to make full revolutions after 1 min   Knee/Hip Exercises: Machines for Strengthening   Total Gym Leg Press b 75# 12x; right only 40# 12x   Knee/Hip Exercises: Standing   Forward Step Up 2 sets;10 reps;Right;Step Height: 6";Hand Hold: 2   Knee/Hip Exercises: Seated   Long Arc Quad Strengthening;Right;2 sets;10 reps   Long Arc Quad Weight 5 lbs.   Knee/Hip Exercises: Supine   Other Supine Knee/Hip Exercises knee flexion with ball 10x   Vasopneumatic   Number Minutes Vasopneumatic  15 minutes   Vasopnuematic Location  Knee   Vasopneumatic Pressure Medium   Vasopneumatic Temperature  3 snowflakes   Manual Therapy   Manual Therapy Joint mobilization;Soft tissue mobilization;Muscle Energy Technique   Joint Mobilization knee flexion with overpressure in sitting and supine   Soft tissue mobilization right quads   Muscle Energy Technique  contract/relax quads                  PT Short Term Goals - 03/04/16 1525    PT SHORT TERM GOAL #1   Title independent with initial HEP    Status Achieved   PT SHORT TERM GOAL #2   Title ambulate in home without assistive device due to decreased pain and increased mobility   Status Achieved   PT SHORT TERM GOAL #3   Title right knee flexion PROM >/= 105 degrees   Status Achieved   PT SHORT TERM GOAL #4   Title right knee extension PROM 0 degrees   Status Achieved   PT SHORT TERM GOAL #5   Title pain in right knee with daily activities decreased >/= 75%   Status Achieved           PT Long Term Goals - 03/04/16 1525    PT LONG TERM GOAL #1   Title independent with HEP   Time 8   Period Weeks   Status On-going   PT LONG TERM GOAL #2   Title ability to ambulate  without an assistive  device due to increased right knee strength >/= 4+/5   Time 8   Period Weeks   Status On-going   PT LONG TERM GOAL #3   Title get in and out of the car with greater ease due to increased right knee flexion >/= 110 AROM   Time 8   Period Weeks   Status On-going   PT LONG TERM GOAL #4   Title go up and down steps with a step over step pattern due to incresaed right knee flexion >/= 110 degrees   Time 8   Period Weeks   Status On-going   PT LONG TERM GOAL #5   Title return to exercise at the gym in complex and understand how to correct correctly   Time 8   Period Weeks   Status On-going               Plan - 03/04/16 1500    Clinical Impression Statement Improving right knee flexion AROM and PROM.  Quad muscle weakness on right persists contributing to difficulty stepping up on 6 inch step.  Mild to moderate knee edema, which may limiit knee flexion, decreased following vasocompression.  Therapist closely monitoring response throughout treatment session.     PT Next Visit Plan work on right knee flexion, single leg on leg press;  strengthening and proprioception; manual therapy;  G code next visit;  Recheck AROM      Patient will benefit from skilled therapeutic intervention in order to improve the following deficits and impairments:     Visit Diagnosis: Muscle weakness (generalized)  Localized edema  Pain in right knee  Other abnormalities of gait and mobility     Problem List Patient Active Problem List   Diagnosis Date Noted  . Osteoarthritis of right knee 01/06/2016  . Status post right partial knee replacement 01/06/2016  Lavinia SharpsStacy Simpson, PT 03/04/2016 3:28 PM Phone: [phone] Fax: [phone]   Vivien PrestoSimpson, Stacy C 03/04/2016, 3:28 PM  [facility] [address], [address] [place], [place], [address] Phone: [phone]   Fax:  [phone]  Name: Jossie NgHettie Glockner MRN:  [id number] Date of Birth: 07/07/1941

## 2016-03-09 ENCOUNTER — Ambulatory Visit: Payer: Medicare Other | Admitting: Physical Therapy

## 2016-03-09 DIAGNOSIS — M25561 Pain in right knee: Secondary | ICD-10-CM

## 2016-03-09 DIAGNOSIS — R2689 Other abnormalities of gait and mobility: Secondary | ICD-10-CM | POA: Diagnosis not present

## 2016-03-09 DIAGNOSIS — R600 Localized edema: Secondary | ICD-10-CM | POA: Diagnosis not present

## 2016-03-09 DIAGNOSIS — M6281 Muscle weakness (generalized): Secondary | ICD-10-CM | POA: Diagnosis not present

## 2016-03-09 NOTE — Therapy (Signed)
[facility] [address] [place], [place], [address] Phone: [phone]   Fax:  [phone]  Physical Therapy Treatment  Patient Details  Name: Kristi David MRN: [id number] Date of Birth: 07-14-1941 Referring Provider: Dr. Carolynn Comment  Encounter Date: 03/09/2016      PT End of Session - 03/09/16 1524    Visit Number 10   Number of Visits 20   Date for PT Re-Evaluation 03/30/16   Authorization Type [employer] medicare   PT Start Time 1416  patient late   PT Stop Time 1510   PT Time Calculation (min) 54 min   Activity Tolerance Patient tolerated treatment well      Past Medical History  Diagnosis Date  . Syncope     passed out and was hospitalized for a couple of days  . Hypertension   . Shortness of breath dyspnea     if too active  . Seasonal allergies   . GERD (gastroesophageal reflux disease)     no meds  . High cholesterol   . Type II diabetes mellitus (HCC)     does not take medicine diet controlled  . Anemia   . History of blood transfusion   . Arthritis     "legs, knees" (01/07/2016)    Past Surgical History  Procedure Laterality Date  . Vein ligation and stripping Bilateral   . Mr lower leg left ([facility] hx) Left     after a break  . Knee arthroscopy with excision baker's cyst Right   . Bunionectomy Bilateral   . Partial knee arthroplasty Right 01/06/2016    Procedure: RIGHT UNICOMPARTMENTAL KNEE ARTHROPLASTY;  Surgeon: Kathryne Hitch, MD;  Location: [facility] OR;  Service: Orthopedics;  Laterality: Right;  . Inguinal hernia repair Right   . Cataract extraction w/ intraocular lens  implant, bilateral Bilateral     There were no vitals filed for this visit.      Subjective Assessment - 03/09/16 1419    Subjective Patient arrives 16 min late.  Ambulating without her cane.  States her knee hurts a little more today than the last time.  Reports her muscles were tired after last time.  Ankle  edema.   Currently in Pain? Yes   Pain Score 4    Pain Location Knee   Pain Orientation Right            [facility] PT Assessment - 03/09/16 0001    Observation/Other Assessments   Focus on Therapeutic Outcomes (FOTO)  61% limitation    AROM   Right Knee Extension 5   Right Knee Flexion 97   Strength   Right Knee Flexion 4/5   Right Knee Extension 4+/5                     [facility] Adult PT Treatment/Exercise - 03/09/16 0001    Knee/Hip Exercises: Stretches   Active Hamstring Stretch Right;3 reps;30 seconds  on stair step   Other Knee/Hip Stretches knee flexion on second step on /off 2 min   Knee/Hip Exercises: Aerobic   Recumbent Bike 6 min; able to make full revolutions after 1 min   Knee/Hip Exercises: Machines for Strengthening   Total Gym Leg Press b 75# 20x; right only 40# 20x   Knee/Hip Exercises: Standing   Forward Step Up 2 sets;10 reps;Right;Step Height: 6";Hand Hold: 2   Knee/Hip Exercises: Seated   Long Arc Quad Strengthening;Right;2 sets;10 reps   Long Arc [employer]  5 lbs.   Other Seated Knee/Hip Exercises red band HS curls    Vasopneumatic   Number Minutes Vasopneumatic  15 minutes   Vasopnuematic Location  Knee   Vasopneumatic Pressure Medium   Vasopneumatic Temperature  3 snowflakes   Manual Therapy   Joint Mobilization knee flexion with overpressure in sitting and supine   Soft tissue mobilization right quads   Muscle Energy Technique contract/relax quads                  PT Short Term Goals - 03/09/16 1654    PT SHORT TERM GOAL #1   Title independent with initial HEP    Status Achieved   PT SHORT TERM GOAL #2   Title ambulate in home without assistive device due to decreased pain and increased mobility   Status Achieved   PT SHORT TERM GOAL #3   Title right knee flexion PROM >/= 105 degrees   Status Achieved   PT SHORT TERM GOAL #4   Title right knee extension PROM 0 degrees   Status Achieved   PT SHORT TERM GOAL #5    Title pain in right knee with daily activities decreased >/= 75%   Status Achieved           PT Long Term Goals - 03/09/16 1654    PT LONG TERM GOAL #1   Title independent with HEP   Time 8   Period Weeks   Status On-going   PT LONG TERM GOAL #2   Title ability to ambulate without an assistive  device due to increased right knee strength >/= 4+/5   Time 8   Period Weeks   Status On-going   PT LONG TERM GOAL #3   Title get in and out of the car with greater ease due to increased right knee flexion >/= 110 AROM   Time 8   Period Weeks   Status On-going   PT LONG TERM GOAL #4   Title go up and down steps with a step over step pattern due to incresaed right knee flexion >/= 110 degrees   Time 8   Period Weeks   Status On-going   PT LONG TERM GOAL #5   Title return to exercise at the gym in complex and understand how to correct correctly   Time 8   Period Weeks   Status On-going               Plan - 03/09/16 1649    Clinical Impression Statement Patient presents without her cane today.  Improving stance time symmetry with gait.  Improving knee AROM:  5-97 degrees actively in sitting, still limited by joint stiffness and edema.  FOTO functional outcome score improved from 63% limitation to 61%.  Therapist closely monitoring response to all treatment interventions.     PT Next Visit Plan work on right knee flexion, single leg on leg press;  strengthening and proprioception; manual therapy; vasocompression for edema control;  check circumferential measures      Patient will benefit from skilled therapeutic intervention in order to improve the following deficits and impairments:     Visit Diagnosis: Muscle weakness (generalized)  Localized edema  Pain in right knee  Other abnormalities of gait and mobility       G-Codes - 03/09/16 1656    Functional Assessment Tool Used FOTO   Functional Limitation Mobility: Walking and moving around   Mobility: Walking and  Moving Around Current Status ([id number]) At least  60 percent but less than 80 percent impaired, limited or restricted   Mobility: Walking and Moving Around Goal Status [phone]) At least 60 percent but less than 80 percent impaired, limited or restricted      Problem List Patient Active Problem List   Diagnosis Date Noted  . Osteoarthritis of right knee 01/06/2016  . Status post right partial knee replacement 01/06/2016    [name], PT 03/09/2016 4:58 PM Phone: [phone] Fax: [phone] [name] 03/09/2016, 4:57 PM  [facility] [address], [address] [place], [place], [address] Phone: [phone]   Fax:  [phone]  Name: Kristi David MRN: [id number] Date of Birth: Sep 28, 1940

## 2016-03-11 ENCOUNTER — Ambulatory Visit: Payer: Medicare Other | Admitting: Physical Therapy

## 2016-03-11 DIAGNOSIS — R2689 Other abnormalities of gait and mobility: Secondary | ICD-10-CM

## 2016-03-11 DIAGNOSIS — M6281 Muscle weakness (generalized): Secondary | ICD-10-CM | POA: Diagnosis not present

## 2016-03-11 DIAGNOSIS — R600 Localized edema: Secondary | ICD-10-CM | POA: Diagnosis not present

## 2016-03-11 DIAGNOSIS — M25561 Pain in right knee: Secondary | ICD-10-CM

## 2016-03-11 NOTE — Therapy (Signed)
[facility] [address], [address] [place], [place], [address] Phone: [phone]   Fax:  [phone]  Physical Therapy Treatment  Patient Details  Name: Kristi David MRN: [id number] Date of Birth: 1940/11/09 Referring Provider: Dr. Carolynn Comment  Encounter Date: 03/11/2016      PT End of Session - 03/11/16 1503    Visit Number 11   Number of Visits 20   Date for PT Re-Evaluation 03/30/16   Authorization Type [employer]HC medicare   PT Start Time 1446   PT Stop Time 1545   PT Time Calculation (min) 59 min   Activity Tolerance Patient tolerated treatment well      Past Medical History  Diagnosis Date  . Syncope     passed out and was hospitalized for a couple of days  . Hypertension   . Shortness of breath dyspnea     if too active  . Seasonal allergies   . GERD (gastroesophageal reflux disease)     no meds  . High cholesterol   . Type II diabetes mellitus (HCC)     does not take medicine diet controlled  . Anemia   . History of blood transfusion   . Arthritis     "legs, knees" (01/07/2016)    Past Surgical History  Procedure Laterality Date  . Vein ligation and stripping Bilateral   . Mr lower leg left ([facility] hx) Left     after a break  . Knee arthroscopy with excision baker's cyst Right   . Bunionectomy Bilateral   . Partial knee arthroplasty Right 01/06/2016    Procedure: RIGHT UNICOMPARTMENTAL KNEE ARTHROPLASTY;  Surgeon: Kathryne Hitch, MD;  Location: [facility] OR;  Service: Orthopedics;  Laterality: Right;  . Inguinal hernia repair Right   . Cataract extraction w/ intraocular lens  implant, bilateral Bilateral     There were no vitals filed for this visit.      Subjective Assessment - 03/11/16 1453    Subjective No complaints.  Brought cane but not using in the clinic.  Reports tightness in knee, itchy skin.     Currently in Pain? Yes   Pain Score 3    Pain Location Knee   Pain Orientation Right    Pain Type Surgical pain   Pain Onset More than a month ago   Pain Frequency Intermittent            [facility] PT Assessment - 03/11/16 0001    AROM   Right Knee Flexion 102                     [facility] Adult PT Treatment/Exercise - 03/11/16 0001    Knee/Hip Exercises: Stretches   Active Hamstring Stretch Right;3 reps;30 seconds  on stair step   Other Knee/Hip Stretches knee flexion on second step on /off 2 min   Knee/Hip Exercises: Aerobic   Recumbent Bike Backward revolutions easier  8 min   Knee/Hip Exercises: Standing   Forward Step Up Right;1 set;10 reps;Hand Hold: 1;Step Height: 6"  with left step taps   SLS with Vectors Weight bear on right with left hip 4 ways 10x   Knee/Hip Exercises: Seated   Long Arc Quad Strengthening;Right;2 sets;10 reps   Long Arc Quad Weight 5 lbs.   Other Seated Knee/Hip Exercises red band HS curls    Sit to Sand 1 set;10 reps;without UE support   Vasopneumatic   Number Minutes Vasopneumatic  15 minutes   Vasopnuematic Location  Knee   Vasopneumatic Pressure Medium   Vasopneumatic Temperature  3 snowflakes   Manual Therapy   Joint Mobilization knee flexion with overpressure in sitting and supine   Soft tissue mobilization right quads   Muscle Energy Technique contract/relax quads                  PT Short Term Goals - 03/11/16 1522    PT SHORT TERM GOAL #1   Title independent with initial HEP    Status Achieved   PT SHORT TERM GOAL #2   Title ambulate in home without assistive device due to decreased pain and increased mobility   Status Achieved   PT SHORT TERM GOAL #3   Title right knee flexion PROM >/= 105 degrees   Status Achieved   PT SHORT TERM GOAL #4   Title right knee extension PROM 0 degrees   PT SHORT TERM GOAL #5   Title pain in right knee with daily activities decreased >/= 75%   Status Achieved           PT Long Term Goals - 03/11/16 1523    PT LONG TERM GOAL #1   Title independent with  HEP   Time 8   Period Weeks   Status On-going   PT LONG TERM GOAL #2   Title ability to ambulate without an assistive  device due to increased right knee strength >/= 4+/5   Time 8   Period Weeks   Status On-going   PT LONG TERM GOAL #3   Title get in and out of the car with greater ease due to increased right knee flexion >/= 110 AROM   Time 8   Period Weeks   Status On-going   PT LONG TERM GOAL #4   Title go up and down steps with a step over step pattern due to incresaed right knee flexion >/= 110 degrees   Time 8   Period Weeks   Status On-going   PT LONG TERM GOAL #5   Title return to exercise at the gym in complex and understand how to correct correctly   Time 8   Period Weeks   Status On-going               Plan - 03/11/16 1503    Clinical Impression Statement The patient is improving with  knee flexion AROM and strength although lacks good motor control with step ups and downs.   Moderate knee edema persists which also limits AROM and motor control   Closely monitoring pain and supervising in safety in standing.  Progressing with rehab goals.     PT Next Visit Plan work on right knee flexion, small step downs;    strengthening and proprioception; manual therapy; vasocompression for edema control;  check circumferential measures      Patient will benefit from skilled therapeutic intervention in order to improve the following deficits and impairments:     Visit Diagnosis: Muscle weakness (generalized)  Localized edema  Pain in right knee  Other abnormalities of gait and mobility     Problem List Patient Active Problem List   Diagnosis Date Noted  . Osteoarthritis of right knee 01/06/2016  . Status post right partial knee replacement 01/06/2016   [name], PT 03/11/2016 3:28 PM Phone: [phone] Fax: [phone]  [name] 03/11/2016, 3:28 PM  [facility] [address],  [address] [place], [place], [address] Phone: [phone]   Fax:  [phone]  Name: Kristi David MRN: [id number] Date of Birth: 09/26/1941

## 2016-03-16 ENCOUNTER — Ambulatory Visit: Payer: Medicare Other | Admitting: Physical Therapy

## 2016-03-16 DIAGNOSIS — M6281 Muscle weakness (generalized): Secondary | ICD-10-CM | POA: Diagnosis not present

## 2016-03-16 DIAGNOSIS — M25561 Pain in right knee: Secondary | ICD-10-CM

## 2016-03-16 DIAGNOSIS — R600 Localized edema: Secondary | ICD-10-CM

## 2016-03-16 DIAGNOSIS — R2689 Other abnormalities of gait and mobility: Secondary | ICD-10-CM | POA: Diagnosis not present

## 2016-03-16 NOTE — Therapy (Signed)
[facility] [address] [place], [place], [address] Phone: [phone]   Fax:  [phone]  Physical Therapy Treatment  Patient Details  Name: Kristi David MRN: [id number] Date of Birth: 26-Apr-1941 Referring Provider: Dr. Carolynn Comment  Encounter Date: 03/16/2016      PT End of Session - 03/16/16 1413    Visit Number 12   Number of Visits 20   Date for PT Re-Evaluation 03/30/16   Authorization Type [employer] medicare   PT Start Time 1355   PT Stop Time 1450   PT Time Calculation (min) 55 min   Activity Tolerance Patient tolerated treatment well      Past Medical History  Diagnosis Date  . Syncope     passed out and was hospitalized for a couple of days  . Hypertension   . Shortness of breath dyspnea     if too active  . Seasonal allergies   . GERD (gastroesophageal reflux disease)     no meds  . High cholesterol   . Type II diabetes mellitus (HCC)     does not take medicine diet controlled  . Anemia   . History of blood transfusion   . Arthritis     "legs, knees" (01/07/2016)    Past Surgical History  Procedure Laterality Date  . Vein ligation and stripping Bilateral   . Mr lower leg left ([facility] hx) Left     after a break  . Knee arthroscopy with excision baker's cyst Right   . Bunionectomy Bilateral   . Partial knee arthroplasty Right 01/06/2016    Procedure: RIGHT UNICOMPARTMENTAL KNEE ARTHROPLASTY;  Surgeon: Kathryne Hitch, MD;  Location: [facility] OR;  Service: Orthopedics;  Laterality: Right;  . Inguinal hernia repair Right   . Cataract extraction w/ intraocular lens  implant, bilateral Bilateral     There were no vitals filed for this visit.      Subjective Assessment - 03/16/16 1400    Subjective Immediately upon gettin on the bike, she is able to make a full revolution.  Busy weekend at her son's house so took it easy yesterday. "I didn't take my cane."    Some soreness after last  session but it goes away after I put it up on some pillows.     Pain Score 0-No pain   Pain Location Knee   Pain Orientation Right   Pain Type Surgical pain   Pain Onset More than a month ago   Pain Frequency Intermittent            [facility] PT Assessment - 03/16/16 0001    Circumferential Edema   Circumferential - Right 46 cm                     [facility] Adult PT Treatment/Exercise - 03/16/16 0001    Knee/Hip Exercises: Stretches   Active Hamstring Stretch Right;3 reps;30 seconds  on stair step   Other Knee/Hip Stretches knee flexion on second step on /off 2 min   Knee/Hip Exercises: Aerobic   Recumbent Bike 8 min full revolutions   Knee/Hip Exercises: Standing   Forward Step Up Right;1 set;10 reps;Hand Hold: 1;Step Height: 6"  with left step taps   Step Down Right;1 set;10 reps;Hand Hold: 2;Step Height: 4"   SLS with Vectors Weight bear on right with left hip 4 ways 10x   Other Standing Knee Exercises Wall climbs 10x   Knee/Hip Exercises: Seated   Long Arc [employer]  Strengthening;Right;2 sets;10 reps   Long Arc Quad Weight 6 lbs.   Other Seated Knee/Hip Exercises green band HS curls 15x   Sit to Sand 1 set;10 reps;without UE support   Vasopneumatic   Number Minutes Vasopneumatic  15 minutes   Vasopnuematic Location  Knee   Vasopneumatic Pressure Medium   Vasopneumatic Temperature  3 snowflakes   Manual Therapy   Joint Mobilization knee flexion with overpressure in sitting and supine   Soft tissue mobilization right quads   Muscle Energy Technique contract/relax quads                  PT Short Term Goals - 03/16/16 1414    PT SHORT TERM GOAL #1   Title independent with initial HEP    Status Achieved   PT SHORT TERM GOAL #2   Title ambulate in home without assistive device due to decreased pain and increased mobility   Status Achieved   PT SHORT TERM GOAL #3   Title right knee flexion PROM >/= 105 degrees   Status Achieved   PT SHORT TERM GOAL #4    Title right knee extension PROM 0 degrees   Status Achieved   PT SHORT TERM GOAL #5   Title pain in right knee with daily activities decreased >/= 75%   Status Achieved           PT Long Term Goals - 03/16/16 1415    PT LONG TERM GOAL #1   Title independent with HEP   Time 8   Period Weeks   Status On-going   PT LONG TERM GOAL #2   Title ability to ambulate without an assistive  device due to increased right knee strength >/= 4+/5   Time 8   Period Weeks   Status On-going   PT LONG TERM GOAL #3   Title get in and out of the car with greater ease due to increased right knee flexion >/= 110 AROM   Period Weeks   Status On-going   PT LONG TERM GOAL #4   Title go up and down steps with a step over step pattern due to incresaed right knee flexion >/= 110 degrees   Time 8   Period Weeks   Status On-going   PT LONG TERM GOAL #5   Title return to exercise at the gym in complex and understand how to correct correctly   Time 8   Period Weeks   Status On-going               Plan - 03/16/16 1435    Clinical Impression Statement Although patient reports an improvement in knee edema overall, this afternoon her circumferential measurements are unchanged and limit ROM.  She is able to progress resistive strengthening exercises.  Decreased limp with ambulation and decreased reliance on cane.  Decreased eccentric quad motor control with step downs.  Therapist closely monitoring response with all treatment interventions.     PT Next Visit Plan recheck AROM;  MD note; leg press; small step downs;  vasocompression for edema control      Patient will benefit from skilled therapeutic intervention in order to improve the following deficits and impairments:     Visit Diagnosis: Muscle weakness (generalized)  Localized edema  Pain in right knee     Problem List Patient Active Problem List   Diagnosis Date Noted  . Osteoarthritis of right knee 01/06/2016  . Status post  right partial knee replacement 01/06/2016   Kennyth ArnoldStacy  Simpson, PT 03/16/2016 2:45 PM Phone: [phone] Fax: [phone]  [name] 03/16/2016, 2:41 PM  [facility] [address], [address] [place], [place], [address] Phone: [phone]   Fax:  [phone]  Name: Kristi David MRN: [id number] Date of Birth: 05/05/1941

## 2016-03-17 DIAGNOSIS — R109 Unspecified abdominal pain: Secondary | ICD-10-CM | POA: Diagnosis not present

## 2016-03-17 DIAGNOSIS — N3281 Overactive bladder: Secondary | ICD-10-CM | POA: Diagnosis not present

## 2016-03-17 DIAGNOSIS — R8290 Unspecified abnormal findings in urine: Secondary | ICD-10-CM | POA: Diagnosis not present

## 2016-03-17 DIAGNOSIS — K219 Gastro-esophageal reflux disease without esophagitis: Secondary | ICD-10-CM | POA: Diagnosis not present

## 2016-03-18 ENCOUNTER — Ambulatory Visit: Payer: Medicare Other | Admitting: Physical Therapy

## 2016-03-18 ENCOUNTER — Encounter: Payer: Self-pay | Admitting: Physical Therapy

## 2016-03-18 DIAGNOSIS — R2689 Other abnormalities of gait and mobility: Secondary | ICD-10-CM

## 2016-03-18 DIAGNOSIS — M25561 Pain in right knee: Secondary | ICD-10-CM

## 2016-03-18 DIAGNOSIS — R600 Localized edema: Secondary | ICD-10-CM

## 2016-03-18 DIAGNOSIS — M6281 Muscle weakness (generalized): Secondary | ICD-10-CM | POA: Diagnosis not present

## 2016-03-18 NOTE — Therapy (Signed)
[facility] [address] [place], [place], [address] Phone: [phone]   Fax:  [phone]  Physical Therapy Treatment  Patient Details  Name: Kristi David MRN: [id number] Date of Birth: 07-11-1941 Referring Provider: Dr. Carolynn Comment  Encounter Date: 03/18/2016      PT End of Session - 03/18/16 1447    Visit Number 13   Number of Visits 20   Date for PT Re-Evaluation 03/30/16   Authorization Type [employer] medicare   PT Start Time 1400   PT Stop Time 1455   PT Time Calculation (min) 55 min   Activity Tolerance Patient tolerated treatment well   Behavior During Therapy [facility] for tasks assessed/performed      Past Medical History  Diagnosis Date  . Syncope     passed out and was hospitalized for a couple of days  . Hypertension   . Shortness of breath dyspnea     if too active  . Seasonal allergies   . GERD (gastroesophageal reflux disease)     no meds  . High cholesterol   . Type II diabetes mellitus (HCC)     does not take medicine diet controlled  . Anemia   . History of blood transfusion   . Arthritis     "legs, knees" (01/07/2016)    Past Surgical History  Procedure Laterality Date  . Vein ligation and stripping Bilateral   . Mr lower leg left ([facility] hx) Left     after a break  . Knee arthroscopy with excision baker's cyst Right   . Bunionectomy Bilateral   . Partial knee arthroplasty Right 01/06/2016    Procedure: RIGHT UNICOMPARTMENTAL KNEE ARTHROPLASTY;  Surgeon: Kathryne Hitch, MD;  Location: [facility] OR;  Service: Orthopedics;  Laterality: Right;  . Inguinal hernia repair Right   . Cataract extraction w/ intraocular lens  implant, bilateral Bilateral     There were no vitals filed for this visit.      Subjective Assessment - 03/18/16 1413    Subjective no pain today.  My knee is getting easier to move.    Pertinent History Partial knee replacement Rt 01/06/2016.    Patient Stated  Goals bend right knee, walking,    Currently in Pain? No/denies            [facility] PT Assessment - 03/18/16 0001    PROM   Right Knee Extension 0   Right Knee Flexion 110                     [facility] Adult PT Treatment/Exercise - 03/18/16 0001    Knee/Hip Exercises: Aerobic   Recumbent Bike 8 min full revolutions   Knee/Hip Exercises: Standing   Lateral Step Up Right;1 set;10 reps;Hand Hold: 1   Forward Step Up Right;10 reps;Hand Hold: 2  vc on control coming down   SLS with Vectors Weight bear on right with left hip 4 ways 10x   Knee/Hip Exercises: Seated   Long Arc Quad Strengthening;Right;2 sets;10 reps   Long Arc Quad Weight 6 lbs.   Other Seated Knee/Hip Exercises green band HS curls 15x   Sit to Sand 1 set;10 reps;without UE support   Knee/Hip Exercises: Supine   Quad Sets --  sit on chair with blue foam   Vasopneumatic   Number Minutes Vasopneumatic  15 minutes   Vasopnuematic Location  Knee   Vasopneumatic Pressure Medium   Vasopneumatic Temperature  3 snowflakes   Manual  Therapy   Joint Mobilization knee flexion with overpressure in  supine                PT Education - 03/18/16 1446    Education provided No          PT Short Term Goals - 03/16/16 1414    PT SHORT TERM GOAL #1   Title independent with initial HEP    Status Achieved   PT SHORT TERM GOAL #2   Title ambulate in home without assistive device due to decreased pain and increased mobility   Status Achieved   PT SHORT TERM GOAL #3   Title right knee flexion PROM >/= 105 degrees   Status Achieved   PT SHORT TERM GOAL #4   Title right knee extension PROM 0 degrees   Status Achieved   PT SHORT TERM GOAL #5   Title pain in right knee with daily activities decreased >/= 75%   Status Achieved           PT Long Term Goals - 03/16/16 1415    PT LONG TERM GOAL #1   Title independent with HEP   Time 8   Period Weeks   Status On-going   PT LONG TERM GOAL #2   Title  ability to ambulate without an assistive  device due to increased right knee strength >/= 4+/5   Time 8   Period Weeks   Status On-going   PT LONG TERM GOAL #3   Title get in and out of the car with greater ease due to increased right knee flexion >/= 110 AROM   Period Weeks   Status On-going   PT LONG TERM GOAL #4   Title go up and down steps with a step over step pattern due to incresaed right knee flexion >/= 110 degrees   Time 8   Period Weeks   Status On-going   PT LONG TERM GOAL #5   Title return to exercise at the gym in complex and understand how to correct correctly   Time 8   Period Weeks   Status On-going               Plan - 03/18/16 1447    Clinical Impression Statement Patient has increased right knee flexion PROM to 110 and extension is 0 degrees.  Patient is able to go fully around on the bike due to increased right knee ROM. Patient is having trouble with going up a step without pulling on the step.  Patient will benefit from skilled therapy to increase right knee ROM and strength.    Rehab Potential Excellent   Clinical Impairments Affecting Rehab Potential None   PT Frequency 2x / week   PT Duration 8 weeks   PT Treatment/Interventions ADLs/Self Care Home Management;Cryotherapy;Electrical Stimulation;Ultrasound;Moist Heat;Gait training;Stair training;Functional mobility training;Therapeutic activities;Therapeutic exercise;Balance training;Patient/family education;Neuromuscular re-education;Manual techniques;Scar mobilization;Vasopneumatic Device;Passive range of motion   PT Next Visit Plan  leg press; small step downs;  vasocompression for edema control; Patient sees MD n 03/25/2016   PT Home Exercise Plan progress as tolerated   Consulted and Agree with Plan of Care Patient      Patient will benefit from skilled therapeutic intervention in order to improve the following deficits and impairments:  Abnormal gait, Increased fascial restricitons, Pain,  Decreased scar mobility, Decreased mobility, Decreased activity tolerance, Decreased endurance, Decreased range of motion, Decreased strength, Impaired flexibility, Increased edema, Decreased balance  Visit Diagnosis: Muscle weakness (generalized)  Localized edema  Pain in right knee  Other abnormalities of gait and mobility     Problem List Patient Active Problem List   Diagnosis Date Noted  . Osteoarthritis of right knee 01/06/2016  . Status post right partial knee replacement 01/06/2016    Eulis Fosterheryl Cornisha Zetino, PT 03/18/2016 2:51 PM   [facility] [address] [place], [place], [address] Phone: [phone]   Fax:  [phone]  Name: Jossie NgHettie Isbell MRN: [id number] Date of Birth: 11/16/1940

## 2016-03-23 ENCOUNTER — Encounter: Payer: Medicare Other | Admitting: Physical Therapy

## 2016-03-23 DIAGNOSIS — M25562 Pain in left knee: Secondary | ICD-10-CM | POA: Diagnosis not present

## 2016-03-23 DIAGNOSIS — M25561 Pain in right knee: Secondary | ICD-10-CM | POA: Diagnosis not present

## 2016-03-25 ENCOUNTER — Ambulatory Visit: Payer: Self-pay

## 2016-04-05 ENCOUNTER — Ambulatory Visit: Payer: Medicare Other | Attending: Orthopaedic Surgery

## 2016-04-05 DIAGNOSIS — M6281 Muscle weakness (generalized): Secondary | ICD-10-CM | POA: Diagnosis not present

## 2016-04-05 DIAGNOSIS — M25561 Pain in right knee: Secondary | ICD-10-CM | POA: Diagnosis not present

## 2016-04-05 DIAGNOSIS — R2689 Other abnormalities of gait and mobility: Secondary | ICD-10-CM | POA: Diagnosis not present

## 2016-04-05 DIAGNOSIS — R600 Localized edema: Secondary | ICD-10-CM | POA: Diagnosis not present

## 2016-04-05 NOTE — Therapy (Signed)
[facility] [address], [place], [place], [address] Phone: [phone]   Fax:  [phone]  Physical Therapy Treatment  Patient Details  Name: Kristi David MRN: [id number] Date of Birth: 01-29-1941 Referring Provider: Dr. Michaelle Birks  Encounter Date: 04/05/2016      PT End of Session - 04/05/16 1443    Visit Number 14   PT Start Time 1408  late   PT Stop Time 1458   PT Time Calculation (min) 50 min   Activity Tolerance Patient tolerated treatment well   Behavior During Therapy [facility] for tasks assessed/performed      Past Medical History  Diagnosis Date  . Syncope     passed out and was hospitalized for a couple of days  . Hypertension   . Shortness of breath dyspnea     if too active  . Seasonal allergies   . GERD (gastroesophageal reflux disease)     no meds  . High cholesterol   . Type II diabetes mellitus (HCC)     does not take medicine diet controlled  . Anemia   . History of blood transfusion   . Arthritis     "legs, knees" (01/07/2016)    Past Surgical History  Procedure Laterality Date  . Vein ligation and stripping Bilateral   . Mr lower leg left ([facility] hx) Left     after a break  . Knee arthroscopy with excision baker's cyst Right   . Bunionectomy Bilateral   . Partial knee arthroplasty Right 01/06/2016    Procedure: RIGHT UNICOMPARTMENTAL KNEE ARTHROPLASTY;  Surgeon: Mcarthur Rossetti, MD;  Location: [facility];  Service: Orthopedics;  Laterality: Right;  . Inguinal hernia repair Right   . Cataract extraction w/ intraocular lens  implant, bilateral Bilateral     There were no vitals filed for this visit.      Subjective Assessment - 04/05/16 1415    Subjective No pain.  Saw MD and he said I was doing fine.     Currently in Pain? No/denies            [facility] PT Assessment - 04/05/16 0001    Assessment   Medical Diagnosis S/P right unilateal knee arhroplasty   Onset  Date/Surgical Date 01/06/16   Cognition   Overall Cognitive Status Within Functional Limits for tasks assessed   Observation/Other Assessments   Focus on Therapeutic Outcomes (FOTO)  45% limitation   Circumferential Edema   Circumferential - Right 45 cm   PROM   Right Knee Extension 3   Right Knee Flexion 110   Strength   Right Knee Flexion 4+/5   Right Knee Extension 5/5                     [facility] Adult PT Treatment/Exercise - 04/05/16 0001    Knee/Hip Exercises: Aerobic   Recumbent Bike 8 min full revolutions   Knee/Hip Exercises: Machines for Strengthening   Total Gym Leg Press bil. 75# 3x10; right only 40# 3x10  seat 6   Knee/Hip Exercises: Standing   Lateral Step Up Right;1 set;10 reps;Hand Hold: 1   Forward Step Up Right;10 reps;Hand Hold: 2  vc on control coming down   Rebounder weightshifting 3 ways x 1 minute each   Knee/Hip Exercises: Seated   Long Arc Quad Strengthening;Right;2 sets;10 reps   Long Arc Quad Weight 6 lbs.   Sit to Sand 1 set;10 reps;without UE support   Vasopneumatic  Number Minutes Vasopneumatic  15 minutes   Vasopnuematic Location  Knee   Vasopneumatic Pressure Medium   Vasopneumatic Temperature  3 snowflakes                  PT Short Term Goals - 03/16/16 1414    PT SHORT TERM GOAL #1   Title independent with initial HEP    Status Achieved   PT SHORT TERM GOAL #2   Title ambulate in home without assistive device due to decreased pain and increased mobility   Status Achieved   PT SHORT TERM GOAL #3   Title right knee flexion PROM >/= 105 degrees   Status Achieved   PT SHORT TERM GOAL #4   Title right knee extension PROM 0 degrees   Status Achieved   PT SHORT TERM GOAL #5   Title pain in right knee with daily activities decreased >/= 75%   Status Achieved           PT Long Term Goals - 04/19/16 1415    PT LONG TERM GOAL #1   Title independent with HEP   Status Achieved   PT LONG TERM GOAL #2   Title  ability to ambulate without an assistive  device due to increased right knee strength >/= 4+/5   Status Achieved   PT LONG TERM GOAL #3   Title get in and out of the car with greater ease due to increased right knee flexion >/= 110 AROM   Status Achieved   PT LONG TERM GOAL #4   Title go up and down steps with a step over step pattern due to incresaed right knee flexion >/= 110 degrees   Status Achieved   PT LONG TERM GOAL #5   Title return to exercise at the gym in complex and understand how to correct correctly   Status Achieved               Plan - 04-19-2016 1425    Clinical Impression Statement Pt had a lapse in treatement 03/18/16-19-Apr-2016 and today will be final session.  Strength and AROM have improved.  Pt remains weak with eccentric strength and continues to descend steps with step-to gait due to weakness. Pt has HEP in place and will continue to work on AROM and strength exercises. Pt will D/C today to HEP.     Rehab Potential --   PT Frequency --   PT Duration --   PT Treatment/Interventions --   PT Next Visit Plan D/C PT to HEP   Consulted and Agree with Plan of Care Patient      Patient will benefit from skilled therapeutic intervention in order to improve the following deficits and impairments:     Visit Diagnosis: Muscle weakness (generalized) - Plan: PT plan of care cert/re-cert  Localized edema - Plan: PT plan of care cert/re-cert  Pain in right knee - Plan: PT plan of care cert/re-cert  Other abnormalities of gait and mobility - Plan: PT plan of care cert/re-cert       G-Codes - 04/19/16 1417    Functional Assessment Tool Used FOTO: 45% limitation   Functional Limitation Mobility: Walking and moving around   Mobility: Walking and Moving Around Goal Status [phone]) At least 60 percent but less than 80 percent impaired, limited or restricted   Mobility: Walking and Moving Around Discharge Status [phone]) At least 40 percent but less than 60 percent  impaired, limited or restricted      Problem  List Patient Active Problem List   Diagnosis Date Noted  . Osteoarthritis of right knee 01/06/2016  . Status post right partial knee replacement 01/06/2016   PHYSICAL THERAPY DISCHARGE SUMMARY  Visits from Start of Care: 14  Current functional level related to goals / functional outcomes: See above for current status.     Remaining deficits: Limited functional strength with descending steps.  Pt has HEP in place for continued gains.   Education / Equipment: HEP Plan: Patient agrees to discharge.  Patient goals were met. Patient is being discharged due to being pleased with the current functional level.  ?????     Sigurd Sos, PT 04/05/2016 2:44 PM  [facility] [address] [place], [place], [address] Phone: [phone]   Fax:  [phone]  Name: Kristi David MRN: [id number] Date of Birth: 12-03-1940

## 2016-04-28 DIAGNOSIS — M1712 Unilateral primary osteoarthritis, left knee: Secondary | ICD-10-CM | POA: Diagnosis not present

## 2016-04-28 DIAGNOSIS — M1711 Unilateral primary osteoarthritis, right knee: Secondary | ICD-10-CM | POA: Diagnosis not present

## 2016-06-02 DIAGNOSIS — I10 Essential (primary) hypertension: Secondary | ICD-10-CM | POA: Diagnosis not present

## 2016-06-02 DIAGNOSIS — N3281 Overactive bladder: Secondary | ICD-10-CM | POA: Diagnosis not present

## 2016-06-02 DIAGNOSIS — G629 Polyneuropathy, unspecified: Secondary | ICD-10-CM | POA: Diagnosis not present

## 2016-06-02 DIAGNOSIS — K581 Irritable bowel syndrome with constipation: Secondary | ICD-10-CM | POA: Diagnosis not present

## 2016-06-03 DIAGNOSIS — K581 Irritable bowel syndrome with constipation: Secondary | ICD-10-CM | POA: Diagnosis not present

## 2016-06-03 DIAGNOSIS — N3281 Overactive bladder: Secondary | ICD-10-CM | POA: Diagnosis not present

## 2016-06-03 DIAGNOSIS — I10 Essential (primary) hypertension: Secondary | ICD-10-CM | POA: Diagnosis not present

## 2016-06-03 DIAGNOSIS — G629 Polyneuropathy, unspecified: Secondary | ICD-10-CM | POA: Diagnosis not present

## 2016-06-11 DIAGNOSIS — N3281 Overactive bladder: Secondary | ICD-10-CM | POA: Diagnosis not present

## 2016-06-11 DIAGNOSIS — G629 Polyneuropathy, unspecified: Secondary | ICD-10-CM | POA: Diagnosis not present

## 2016-06-11 DIAGNOSIS — K581 Irritable bowel syndrome with constipation: Secondary | ICD-10-CM | POA: Diagnosis not present

## 2016-06-11 DIAGNOSIS — I10 Essential (primary) hypertension: Secondary | ICD-10-CM | POA: Diagnosis not present

## 2016-06-22 DIAGNOSIS — N3281 Overactive bladder: Secondary | ICD-10-CM | POA: Diagnosis not present

## 2016-06-22 DIAGNOSIS — K581 Irritable bowel syndrome with constipation: Secondary | ICD-10-CM | POA: Diagnosis not present

## 2016-06-22 DIAGNOSIS — I10 Essential (primary) hypertension: Secondary | ICD-10-CM | POA: Diagnosis not present

## 2016-06-22 DIAGNOSIS — G629 Polyneuropathy, unspecified: Secondary | ICD-10-CM | POA: Diagnosis not present

## 2016-06-25 DIAGNOSIS — G629 Polyneuropathy, unspecified: Secondary | ICD-10-CM | POA: Diagnosis not present

## 2016-06-25 DIAGNOSIS — K581 Irritable bowel syndrome with constipation: Secondary | ICD-10-CM | POA: Diagnosis not present

## 2016-06-25 DIAGNOSIS — N3281 Overactive bladder: Secondary | ICD-10-CM | POA: Diagnosis not present

## 2016-06-25 DIAGNOSIS — I10 Essential (primary) hypertension: Secondary | ICD-10-CM | POA: Diagnosis not present

## 2016-06-29 DIAGNOSIS — K581 Irritable bowel syndrome with constipation: Secondary | ICD-10-CM | POA: Diagnosis not present

## 2016-06-29 DIAGNOSIS — G629 Polyneuropathy, unspecified: Secondary | ICD-10-CM | POA: Diagnosis not present

## 2016-06-29 DIAGNOSIS — N3281 Overactive bladder: Secondary | ICD-10-CM | POA: Diagnosis not present

## 2016-06-29 DIAGNOSIS — I10 Essential (primary) hypertension: Secondary | ICD-10-CM | POA: Diagnosis not present

## 2016-07-09 DIAGNOSIS — K581 Irritable bowel syndrome with constipation: Secondary | ICD-10-CM | POA: Diagnosis not present

## 2016-07-09 DIAGNOSIS — I10 Essential (primary) hypertension: Secondary | ICD-10-CM | POA: Diagnosis not present

## 2016-07-09 DIAGNOSIS — N3281 Overactive bladder: Secondary | ICD-10-CM | POA: Diagnosis not present

## 2016-07-09 DIAGNOSIS — G629 Polyneuropathy, unspecified: Secondary | ICD-10-CM | POA: Diagnosis not present

## 2016-07-15 DIAGNOSIS — K581 Irritable bowel syndrome with constipation: Secondary | ICD-10-CM | POA: Diagnosis not present

## 2016-07-15 DIAGNOSIS — G629 Polyneuropathy, unspecified: Secondary | ICD-10-CM | POA: Diagnosis not present

## 2016-07-15 DIAGNOSIS — N3281 Overactive bladder: Secondary | ICD-10-CM | POA: Diagnosis not present

## 2016-07-15 DIAGNOSIS — I10 Essential (primary) hypertension: Secondary | ICD-10-CM | POA: Diagnosis not present

## 2016-07-19 DIAGNOSIS — I10 Essential (primary) hypertension: Secondary | ICD-10-CM | POA: Diagnosis not present

## 2016-07-19 DIAGNOSIS — M170 Bilateral primary osteoarthritis of knee: Secondary | ICD-10-CM | POA: Diagnosis not present

## 2016-07-19 DIAGNOSIS — Z23 Encounter for immunization: Secondary | ICD-10-CM | POA: Diagnosis not present

## 2016-07-19 DIAGNOSIS — N3281 Overactive bladder: Secondary | ICD-10-CM | POA: Diagnosis not present

## 2016-07-19 DIAGNOSIS — Z9109 Other allergy status, other than to drugs and biological substances: Secondary | ICD-10-CM | POA: Diagnosis not present

## 2016-07-19 DIAGNOSIS — F419 Anxiety disorder, unspecified: Secondary | ICD-10-CM | POA: Diagnosis not present

## 2016-07-21 DIAGNOSIS — K581 Irritable bowel syndrome with constipation: Secondary | ICD-10-CM | POA: Diagnosis not present

## 2016-07-21 DIAGNOSIS — N3281 Overactive bladder: Secondary | ICD-10-CM | POA: Diagnosis not present

## 2016-07-21 DIAGNOSIS — I10 Essential (primary) hypertension: Secondary | ICD-10-CM | POA: Diagnosis not present

## 2016-07-21 DIAGNOSIS — G629 Polyneuropathy, unspecified: Secondary | ICD-10-CM | POA: Diagnosis not present

## 2016-07-22 DIAGNOSIS — G629 Polyneuropathy, unspecified: Secondary | ICD-10-CM | POA: Diagnosis not present

## 2016-07-22 DIAGNOSIS — I10 Essential (primary) hypertension: Secondary | ICD-10-CM | POA: Diagnosis not present

## 2016-07-22 DIAGNOSIS — N3281 Overactive bladder: Secondary | ICD-10-CM | POA: Diagnosis not present

## 2016-07-22 DIAGNOSIS — K581 Irritable bowel syndrome with constipation: Secondary | ICD-10-CM | POA: Diagnosis not present

## 2016-07-27 DIAGNOSIS — N3281 Overactive bladder: Secondary | ICD-10-CM | POA: Diagnosis not present

## 2016-07-27 DIAGNOSIS — I10 Essential (primary) hypertension: Secondary | ICD-10-CM | POA: Diagnosis not present

## 2016-07-27 DIAGNOSIS — G629 Polyneuropathy, unspecified: Secondary | ICD-10-CM | POA: Diagnosis not present

## 2016-07-27 DIAGNOSIS — K581 Irritable bowel syndrome with constipation: Secondary | ICD-10-CM | POA: Diagnosis not present

## 2016-07-28 ENCOUNTER — Other Ambulatory Visit (INDEPENDENT_AMBULATORY_CARE_PROVIDER_SITE_OTHER): Payer: Self-pay | Admitting: Orthopaedic Surgery

## 2016-07-29 NOTE — Telephone Encounter (Signed)
Please advise 

## 2016-08-30 DIAGNOSIS — F419 Anxiety disorder, unspecified: Secondary | ICD-10-CM | POA: Diagnosis not present

## 2016-08-30 DIAGNOSIS — K589 Irritable bowel syndrome without diarrhea: Secondary | ICD-10-CM | POA: Diagnosis not present

## 2016-08-30 DIAGNOSIS — I10 Essential (primary) hypertension: Secondary | ICD-10-CM | POA: Diagnosis not present

## 2016-08-30 DIAGNOSIS — M1990 Unspecified osteoarthritis, unspecified site: Secondary | ICD-10-CM | POA: Diagnosis not present

## 2016-10-05 ENCOUNTER — Other Ambulatory Visit (INDEPENDENT_AMBULATORY_CARE_PROVIDER_SITE_OTHER): Payer: Self-pay | Admitting: Physician Assistant

## 2016-10-05 NOTE — Telephone Encounter (Signed)
Please advise 

## 2017-01-11 ENCOUNTER — Ambulatory Visit (INDEPENDENT_AMBULATORY_CARE_PROVIDER_SITE_OTHER): Payer: Medicare Other

## 2017-01-11 ENCOUNTER — Ambulatory Visit (INDEPENDENT_AMBULATORY_CARE_PROVIDER_SITE_OTHER): Payer: Medicare Other | Admitting: Orthopaedic Surgery

## 2017-01-11 DIAGNOSIS — M79641 Pain in right hand: Secondary | ICD-10-CM

## 2017-01-11 DIAGNOSIS — Z96651 Presence of right artificial knee joint: Secondary | ICD-10-CM

## 2017-01-11 DIAGNOSIS — M19041 Primary osteoarthritis, right hand: Secondary | ICD-10-CM | POA: Diagnosis not present

## 2017-01-11 HISTORY — DX: Primary osteoarthritis, right hand: M19.041

## 2017-01-11 MED ORDER — DICLOFENAC SODIUM 1 % TD GEL: 2.0000 g | Freq: Four times a day (QID) | TRANSDERMAL | 3 refills | Status: DC

## 2017-01-11 MED ORDER — METHYLPREDNISOLONE ACETATE 40 MG/ML IJ SUSP
40.0000 mg | INTRAMUSCULAR | Status: AC | PRN
  Administered 2017-01-11: 40 mg

## 2017-01-11 NOTE — Progress Notes (Signed)
Office Visit Note   Patient: Kristi David           Date of Birth: Oct 06, 1940           MRN: [id number] Visit Date: 01/11/2017              Requested by: Sigmund Hazel, MD [address] [place], [place] [address] PCP: Neldon Labella, MD   Assessment & Plan: Visit Diagnoses:  1. History of total right knee replacement   2. Pain in right hand   3. Arthritis of right hand     Plan: I tried to get a steroid injection around her MCP joint her middle finger and she tolerated this well. We'll send in some diclofenac gel for her hand as well. I talked about what arthritis is like for the hand and other things that she can try topically that or even over-the-counter. At this point she'll follow-up as needed.  Follow-Up Instructions: Return if symptoms worsen or fail to improve.   Orders:  Orders Placed This Encounter  Procedures  . Hand/Upper Extremity Injection/Arthrocentesis  . XR Knee 1-2 Views Right  . XR Hand Complete Right   Meds ordered this encounter  Medications  . diclofenac sodium (VOLTAREN) 1 % GEL    Sig: Apply 2 g topically 4 (four) times daily.    Dispense:  3 Tube    Refill:  3      Procedures: Hand/UE Inj Date/Time: 01/11/2017 10:02 AM Performed by: Kathryne Hitch Authorized by: Doneen Poisson Y   Condition: osteoarthritis   Medications:  40 mg methylPREDNISolone acetate 40 MG/ML     Clinical Data: No additional findings.   Subjective: No chief complaint on file. The patient is 1 year out from a right partial knee replacement. She says her knee is done well and she has good range of motion strength and only some stiffness at times but otherwise the knee is doing well. She does want [place] take a look at her right hand. She is right-hand dominant and points to her that'll finger and thumb is sources of pain from arthritis in her hand. The hand hurts with activities of daily living. She denies any headache, chest pain, shortness of breath,  fever, chills, nausea, vomiting  HPI  Review of Systems   Objective: Vital Signs: There were no vitals taken for this visit.  Physical Exam She is alert and oriented 3 and in no acute distress Ortho Exam Examination of her right knee shows no effusion. There is patellofemoral crepitation but is painless to her. There is no lateral joint line tenderness. Her incisions well-healed. There is no effusion of her knee. Her knee range of motion is full and the knee feels ligamentously stable. Examination of her right hand shows arthritic changes at the thumb IP joint as well as swelling of the middle finger MCP joint. Her range of motion is full but painful. She is neurovascular intact. Specialty Comments:  No specialty comments available.  Imaging: Xr Hand Complete Right  Result Date: 01/11/2017 3 views of the right hand show arthritic findings at the middle finger MCP joint with joint space narrowing and sclerotic changes as well as cystic changes consistent with osteoarthritis. Also has arthritis of the thumb IP joint.  Xr Knee 1-2 Views Right  Result Date: 01/11/2017 2 views of her right knee show a unicompartmental knee arthroplasty of the medial compartment. It appears well seated with no evidence of loosening or complication features. She has arthritis of  the patellofemoral joint.    PMFS History: Patient Active Problem List   Diagnosis Date Noted  . Arthritis of right hand 01/11/2017  . Osteoarthritis of right knee 01/06/2016  . Status post right partial knee replacement 01/06/2016   Past Medical History:  Diagnosis Date  . Anemia   . Arthritis    "legs, knees" (01/07/2016)  . GERD (gastroesophageal reflux disease)    no meds  . High cholesterol   . History of blood transfusion   . Hypertension   . Seasonal allergies   . Shortness of breath dyspnea    if too active  . Syncope    passed out and was hospitalized for a couple of days  . Type II diabetes mellitus (HCC)     does not take medicine diet controlled    No family history on file.  Past Surgical History:  Procedure Laterality Date  . BUNIONECTOMY Bilateral   . CATARACT EXTRACTION W/ INTRAOCULAR LENS  IMPLANT, BILATERAL Bilateral   . INGUINAL HERNIA REPAIR Right   . KNEE ARTHROSCOPY WITH EXCISION BAKER'S CYST Right   . MR LOWER LEG LEFT ([facility] HX) Left    after a break  . PARTIAL KNEE ARTHROPLASTY Right 01/06/2016   Procedure: RIGHT UNICOMPARTMENTAL KNEE ARTHROPLASTY;  Surgeon: Kathryne Hitch, MD;  Location: [facility] OR;  Service: Orthopedics;  Laterality: Right;  . VEIN LIGATION AND STRIPPING Bilateral    Social History   Occupational History  . Not on file.   Social History Main Topics  . Smoking status: Never Smoker  . Smokeless tobacco: Never Used  . Alcohol use No  . Drug use: No  . Sexual activity: Not on file

## 2017-02-28 ENCOUNTER — Other Ambulatory Visit (INDEPENDENT_AMBULATORY_CARE_PROVIDER_SITE_OTHER): Payer: Self-pay | Admitting: Physician Assistant

## 2017-02-28 NOTE — Telephone Encounter (Signed)
Please advise 

## 2017-03-31 ENCOUNTER — Other Ambulatory Visit (INDEPENDENT_AMBULATORY_CARE_PROVIDER_SITE_OTHER): Payer: Self-pay | Admitting: Orthopaedic Surgery

## 2017-03-31 NOTE — Telephone Encounter (Signed)
Please advise for restless leg syndrome

## 2017-05-26 ENCOUNTER — Other Ambulatory Visit (INDEPENDENT_AMBULATORY_CARE_PROVIDER_SITE_OTHER): Payer: Self-pay | Admitting: Physician Assistant

## 2017-05-26 NOTE — Telephone Encounter (Signed)
Please advise 

## 2017-05-31 ENCOUNTER — Telehealth (INDEPENDENT_AMBULATORY_CARE_PROVIDER_SITE_OTHER): Payer: Self-pay | Admitting: Radiology

## 2017-05-31 ENCOUNTER — Other Ambulatory Visit (INDEPENDENT_AMBULATORY_CARE_PROVIDER_SITE_OTHER): Payer: Self-pay | Admitting: Physician Assistant

## 2017-05-31 ENCOUNTER — Other Ambulatory Visit (INDEPENDENT_AMBULATORY_CARE_PROVIDER_SITE_OTHER): Payer: Self-pay | Admitting: Orthopaedic Surgery

## 2017-05-31 NOTE — Telephone Encounter (Signed)
Ok to continue the [employer] for a 3 month supply - it is a form of gabapentin.  Not sure what the anti-inflammatory is.

## 2017-05-31 NOTE — Telephone Encounter (Signed)
See below

## 2017-05-31 NOTE — Telephone Encounter (Signed)
Wants a refill on the anti-inflammatory she was given (I don't see an rx for this in her chart -pill form) and she needs refill on the Horizant 600mg  sent to her pharmacy.  She would like these for 3 month supply.

## 2017-05-31 NOTE — Telephone Encounter (Signed)
Please advise 

## 2017-05-31 NOTE — Telephone Encounter (Signed)
This was refilled earlier. 

## 2017-07-11 DIAGNOSIS — K219 Gastro-esophageal reflux disease without esophagitis: Secondary | ICD-10-CM | POA: Diagnosis not present

## 2017-07-11 DIAGNOSIS — R05 Cough: Secondary | ICD-10-CM | POA: Diagnosis not present

## 2017-07-11 DIAGNOSIS — E7800 Pure hypercholesterolemia, unspecified: Secondary | ICD-10-CM | POA: Diagnosis not present

## 2017-07-11 DIAGNOSIS — Z23 Encounter for immunization: Secondary | ICD-10-CM | POA: Diagnosis not present

## 2017-07-11 DIAGNOSIS — F5101 Primary insomnia: Secondary | ICD-10-CM | POA: Diagnosis not present

## 2017-07-11 DIAGNOSIS — N3281 Overactive bladder: Secondary | ICD-10-CM | POA: Diagnosis not present

## 2017-07-11 DIAGNOSIS — I10 Essential (primary) hypertension: Secondary | ICD-10-CM | POA: Diagnosis not present

## 2017-07-11 DIAGNOSIS — R0609 Other forms of dyspnea: Secondary | ICD-10-CM | POA: Diagnosis not present

## 2017-07-11 DIAGNOSIS — F419 Anxiety disorder, unspecified: Secondary | ICD-10-CM | POA: Diagnosis not present

## 2017-07-12 ENCOUNTER — Other Ambulatory Visit: Payer: Self-pay | Admitting: Family Medicine

## 2017-07-12 ENCOUNTER — Ambulatory Visit
Admission: RE | Admit: 2017-07-12 | Discharge: 2017-07-12 | Disposition: A | Discharge status: Home / Self Care | Payer: Medicare Other | Source: Ambulatory Visit | Attending: Family Medicine | Admitting: Family Medicine

## 2017-07-12 DIAGNOSIS — R7989 Other specified abnormal findings of blood chemistry: Secondary | ICD-10-CM

## 2017-07-12 DIAGNOSIS — R0609 Other forms of dyspnea: Principal | ICD-10-CM

## 2017-07-12 DIAGNOSIS — I700 Atherosclerosis of aorta: Secondary | ICD-10-CM | POA: Diagnosis not present

## 2017-07-12 DIAGNOSIS — R0602 Shortness of breath: Secondary | ICD-10-CM | POA: Diagnosis not present

## 2017-07-12 MED ORDER — IOPAMIDOL (ISOVUE-370) INJECTION 76%
75.0000 mL | Freq: Once | INTRAVENOUS | Status: AC | PRN
  Administered 2017-07-12: 75 mL via INTRAVENOUS

## 2017-07-13 ENCOUNTER — Telehealth: Payer: Self-pay | Admitting: *Deleted

## 2017-07-13 NOTE — Telephone Encounter (Signed)
NOTES SENT TO SCHEDULING.  °

## 2017-07-16 ENCOUNTER — Emergency Department (HOSPITAL_COMMUNITY): Payer: Medicare Other

## 2017-07-16 ENCOUNTER — Encounter (HOSPITAL_COMMUNITY): Payer: Self-pay | Admitting: Internal Medicine

## 2017-07-16 ENCOUNTER — Inpatient Hospital Stay (HOSPITAL_COMMUNITY)
Admission: EM | Admit: 2017-07-16 | Discharge: 2017-07-18 | DRG: 065 | Disposition: A | Discharge status: Rehabilitation Facility | Payer: Medicare Other | Attending: Internal Medicine | Admitting: Internal Medicine

## 2017-07-16 DIAGNOSIS — J302 Other seasonal allergic rhinitis: Secondary | ICD-10-CM | POA: Diagnosis present

## 2017-07-16 DIAGNOSIS — I110 Hypertensive heart disease with heart failure: Secondary | ICD-10-CM | POA: Diagnosis present

## 2017-07-16 DIAGNOSIS — I69322 Dysarthria following cerebral infarction: Secondary | ICD-10-CM | POA: Diagnosis not present

## 2017-07-16 DIAGNOSIS — Z8249 Family history of ischemic heart disease and other diseases of the circulatory system: Secondary | ICD-10-CM | POA: Diagnosis not present

## 2017-07-16 DIAGNOSIS — M6281 Muscle weakness (generalized): Secondary | ICD-10-CM | POA: Diagnosis not present

## 2017-07-16 DIAGNOSIS — I6381 Other cerebral infarction due to occlusion or stenosis of small artery: Principal | ICD-10-CM | POA: Diagnosis present

## 2017-07-16 DIAGNOSIS — I10 Essential (primary) hypertension: Secondary | ICD-10-CM

## 2017-07-16 DIAGNOSIS — Z91018 Allergy to other foods: Secondary | ICD-10-CM

## 2017-07-16 DIAGNOSIS — E1151 Type 2 diabetes mellitus with diabetic peripheral angiopathy without gangrene: Secondary | ICD-10-CM | POA: Diagnosis present

## 2017-07-16 DIAGNOSIS — I63312 Cerebral infarction due to thrombosis of left middle cerebral artery: Secondary | ICD-10-CM

## 2017-07-16 DIAGNOSIS — E785 Hyperlipidemia, unspecified: Secondary | ICD-10-CM | POA: Diagnosis not present

## 2017-07-16 DIAGNOSIS — R471 Dysarthria and anarthria: Secondary | ICD-10-CM | POA: Diagnosis present

## 2017-07-16 DIAGNOSIS — I5030 Unspecified diastolic (congestive) heart failure: Secondary | ICD-10-CM | POA: Diagnosis present

## 2017-07-16 DIAGNOSIS — D62 Acute posthemorrhagic anemia: Secondary | ICD-10-CM | POA: Diagnosis present

## 2017-07-16 DIAGNOSIS — I639 Cerebral infarction, unspecified: Secondary | ICD-10-CM | POA: Diagnosis not present

## 2017-07-16 DIAGNOSIS — R29810 Facial weakness: Secondary | ICD-10-CM | POA: Diagnosis present

## 2017-07-16 DIAGNOSIS — I69351 Hemiplegia and hemiparesis following cerebral infarction affecting right dominant side: Secondary | ICD-10-CM | POA: Diagnosis not present

## 2017-07-16 DIAGNOSIS — I5189 Other ill-defined heart diseases: Secondary | ICD-10-CM

## 2017-07-16 DIAGNOSIS — I340 Nonrheumatic mitral (valve) insufficiency: Secondary | ICD-10-CM | POA: Diagnosis not present

## 2017-07-16 DIAGNOSIS — Z79899 Other long term (current) drug therapy: Secondary | ICD-10-CM | POA: Diagnosis not present

## 2017-07-16 DIAGNOSIS — I6789 Other cerebrovascular disease: Secondary | ICD-10-CM | POA: Diagnosis not present

## 2017-07-16 DIAGNOSIS — E119 Type 2 diabetes mellitus without complications: Secondary | ICD-10-CM | POA: Diagnosis not present

## 2017-07-16 DIAGNOSIS — I69391 Dysphagia following cerebral infarction: Secondary | ICD-10-CM | POA: Diagnosis not present

## 2017-07-16 DIAGNOSIS — E876 Hypokalemia: Secondary | ICD-10-CM | POA: Diagnosis not present

## 2017-07-16 DIAGNOSIS — E1159 Type 2 diabetes mellitus with other circulatory complications: Secondary | ICD-10-CM | POA: Diagnosis not present

## 2017-07-16 DIAGNOSIS — Z96651 Presence of right artificial knee joint: Secondary | ICD-10-CM | POA: Diagnosis present

## 2017-07-16 DIAGNOSIS — R4182 Altered mental status, unspecified: Secondary | ICD-10-CM | POA: Diagnosis not present

## 2017-07-16 DIAGNOSIS — F329 Major depressive disorder, single episode, unspecified: Secondary | ICD-10-CM | POA: Diagnosis present

## 2017-07-16 DIAGNOSIS — R1310 Dysphagia, unspecified: Secondary | ICD-10-CM | POA: Diagnosis present

## 2017-07-16 DIAGNOSIS — R531 Weakness: Secondary | ICD-10-CM | POA: Diagnosis not present

## 2017-07-16 DIAGNOSIS — I519 Heart disease, unspecified: Secondary | ICD-10-CM | POA: Diagnosis not present

## 2017-07-16 DIAGNOSIS — I6529 Occlusion and stenosis of unspecified carotid artery: Secondary | ICD-10-CM | POA: Diagnosis not present

## 2017-07-16 DIAGNOSIS — R1312 Dysphagia, oropharyngeal phase: Secondary | ICD-10-CM | POA: Diagnosis present

## 2017-07-16 DIAGNOSIS — E1142 Type 2 diabetes mellitus with diabetic polyneuropathy: Secondary | ICD-10-CM | POA: Diagnosis not present

## 2017-07-16 DIAGNOSIS — R4781 Slurred speech: Secondary | ICD-10-CM | POA: Diagnosis not present

## 2017-07-16 DIAGNOSIS — Z961 Presence of intraocular lens: Secondary | ICD-10-CM | POA: Diagnosis present

## 2017-07-16 DIAGNOSIS — R200 Anesthesia of skin: Secondary | ICD-10-CM

## 2017-07-16 DIAGNOSIS — Z9842 Cataract extraction status, left eye: Secondary | ICD-10-CM | POA: Diagnosis not present

## 2017-07-16 DIAGNOSIS — Z9841 Cataract extraction status, right eye: Secondary | ICD-10-CM | POA: Diagnosis not present

## 2017-07-16 DIAGNOSIS — Z7982 Long term (current) use of aspirin: Secondary | ICD-10-CM

## 2017-07-16 DIAGNOSIS — R9431 Abnormal electrocardiogram [ECG] [EKG]: Secondary | ICD-10-CM | POA: Diagnosis not present

## 2017-07-16 DIAGNOSIS — Z823 Family history of stroke: Secondary | ICD-10-CM | POA: Diagnosis not present

## 2017-07-16 DIAGNOSIS — I361 Nonrheumatic tricuspid (valve) insufficiency: Secondary | ICD-10-CM | POA: Diagnosis not present

## 2017-07-16 HISTORY — DX: Cerebral infarction, unspecified: I63.9

## 2017-07-16 LAB — I-STAT CHEM 8, ED
BUN: 12 mg/dL (ref 6–20)
CREATININE: 0.9 mg/dL (ref 0.44–1.00)
Calcium, Ion: 1.17 mmol/L (ref 1.15–1.40)
Chloride: 108 mmol/L (ref 101–111)
Glucose, Bld: 98 mg/dL (ref 65–99)
HEMATOCRIT: 36 % (ref 36.0–46.0)
Hemoglobin: 12.2 g/dL (ref 12.0–15.0)
POTASSIUM: 4 mmol/L (ref 3.5–5.1)
Sodium: 142 mmol/L (ref 135–145)
TCO2: 25 mmol/L (ref 22–32)

## 2017-07-16 LAB — COMPREHENSIVE METABOLIC PANEL
ALBUMIN: 3.5 g/dL (ref 3.5–5.0)
ALT: 15 U/L (ref 14–54)
ANION GAP: 9 (ref 5–15)
AST: 23 U/L (ref 15–41)
Alkaline Phosphatase: 75 U/L (ref 38–126)
BILIRUBIN TOTAL: 1 mg/dL (ref 0.3–1.2)
BUN: 12 mg/dL (ref 6–20)
CALCIUM: 9 mg/dL (ref 8.9–10.3)
CO2: 23 mmol/L (ref 22–32)
Chloride: 104 mmol/L (ref 101–111)
Creatinine, Ser: 0.94 mg/dL (ref 0.44–1.00)
GFR calc Af Amer: >60 mL/min (ref >60)
GFR, EST NON AFRICAN AMERICAN: 57 mL/min — AB (ref >60)
Glucose, Bld: 97 mg/dL (ref 65–99)
Potassium: 3.9 mmol/L (ref 3.5–5.1)
Sodium: 136 mmol/L (ref 135–145)
TOTAL PROTEIN: 7.2 g/dL (ref 6.5–8.1)

## 2017-07-16 LAB — I-STAT TROPONIN, ED: TROPONIN I, POC: 0 ng/mL (ref 0.00–0.08)

## 2017-07-16 LAB — DIFFERENTIAL
BASOS ABS: 0 10*3/uL (ref 0.0–0.1)
BASOS PCT: 1 %
EOS ABS: 0.1 10*3/uL (ref 0.0–0.7)
Eosinophils Relative: 3 %
Lymphocytes Relative: 44 %
Lymphs Abs: 2.3 10*3/uL (ref 0.7–4.0)
Monocytes Absolute: 0.4 10*3/uL (ref 0.1–1.0)
Monocytes Relative: 8 %
NEUTROS PCT: 44 %
Neutro Abs: 2.2 10*3/uL (ref 1.7–7.7)

## 2017-07-16 LAB — PROTIME-INR
INR: 0.94
Prothrombin Time: 12.4 seconds (ref 11.4–15.2)

## 2017-07-16 LAB — CBC
HCT: 36.7 % (ref 36.0–46.0)
Hemoglobin: 12.3 g/dL (ref 12.0–15.0)
MCH: 28.5 pg (ref 26.0–34.0)
MCHC: 33.5 g/dL (ref 30.0–36.0)
MCV: 85.2 fL (ref 78.0–100.0)
Platelets: 202 10*3/uL (ref 150–400)
RBC: 4.31 MIL/uL (ref 3.87–5.11)
RDW: 14.2 % (ref 11.5–15.5)
WBC: 5.1 10*3/uL (ref 4.0–10.5)

## 2017-07-16 LAB — ETHANOL

## 2017-07-16 LAB — APTT: APTT: 32 s (ref 24–36)

## 2017-07-16 MED ORDER — HYDRALAZINE HCL 20 MG/ML IJ SOLN
5.0000 mg | Freq: Four times a day (QID) | INTRAMUSCULAR | Status: DC | PRN
  Administered 2017-07-17: 5 mg via INTRAVENOUS
  Filled 2017-07-16: qty 1

## 2017-07-16 MED ORDER — LABETALOL HCL 5 MG/ML IV SOLN: 10.0000 mg | Freq: Once | INTRAVENOUS | Status: DC

## 2017-07-16 NOTE — ED Triage Notes (Signed)
Pt arrives EMS from independent living facility. Pt son Called his mom today and noted slurred speech. Now c/o right sided weakness. Slurred speachjright facial droop. last5 know normal one week ago.

## 2017-07-16 NOTE — Consult Note (Signed)
Referring Physician: Dr. Sherry Ruffing    Chief Complaint: Right sided weakness with facial droop since Thursday  HPI: Kristi David is an 76 y.o. female who presents to the ED from her independent living facility for evaluation of right sided weakness with facial droop which started on Thursday. The patient's son called her today and noted slurred speech. On presentation to the ED, the patient was visibly weak on the right, also with slurred speech and right facial droop. She has no prior history of stroke. Denies confusion, trouble getting words out or difficulty comprehending speech. Home medications include ASA 325 mg po qd. CT in the ED reveals an area of subacute ischemia within the centrum semiovale on the left.  LSN: Thursday tPA Given: No: Out of time window  Past Medical History:  Diagnosis Date  . Anemia   . Arthritis    "legs, knees" (01/07/2016)  . GERD (gastroesophageal reflux disease)    no meds  . High cholesterol   . History of blood transfusion   . Hypertension   . Seasonal allergies   . Shortness of breath dyspnea    if too active  . Syncope    passed out and was hospitalized for a couple of days  . Type II diabetes mellitus ([place])    does not take medicine diet controlled    Past Surgical History:  Procedure Laterality Date  . BUNIONECTOMY Bilateral   . CATARACT EXTRACTION W/ INTRAOCULAR LENS  IMPLANT, BILATERAL Bilateral   . INGUINAL HERNIA REPAIR Right   . KNEE ARTHROSCOPY WITH EXCISION BAKER'S CYST Right   . MR LOWER LEG LEFT ([facility] HX) Left    after a break  . PARTIAL KNEE ARTHROPLASTY Right 01/06/2016   Procedure: RIGHT UNICOMPARTMENTAL KNEE ARTHROPLASTY;  Surgeon: Mcarthur Rossetti, MD;  Location: [place];  Service: Orthopedics;  Laterality: Right;  . VEIN LIGATION AND STRIPPING Bilateral     No family history on file. Social History:  reports that she has never smoked. She has never used smokeless tobacco. She reports that she does not drink alcohol or  use drugs.  Allergies:  Allergies  Allergen Reactions  . Oregano [Origanum Oil] Other (See Comments)    Congestion, stuffy nose  . Peanut-Containing Drug Products Other (See Comments)    Congestion, stuffy nose Can eat cooked peanuts if pre-med with benadryl  . Tomato Other (See Comments)    Congestion, stuffy nose    Home Medications:    ROS: No headache or vision changes. Positive for dysarthria. Mild sternal soreness worse with palpation. No abdominal pain or N/V. No limb pain. Other ROS as per HPI.   Physical Examination: Blood pressure (!) 156/98, pulse 74, temperature 98.1 F (36.7 C), resp. rate 17, SpO2 99 %.  HEENT: [place]/AT Lungs: Respirations unlabored Ext: No edema  Neurologic Examination: Mental Status: Alert, oriented, thought content appropriate.  In the context of dysarthria, speech is fluent with intact comprehension and naming. Able to follow all 1-step commands without difficulty. One error with a 2-step directional command.  Cranial Nerves: II:  Visual fields intact bilaterally with no extinction to DSS. PERRL.  III,IV, VI: Ptosis not present. EOM are full horizontally and vertically, with saccadic quality noted during visual pursuits.  VII: Right facial droop is prominent. Temp sensation decreased on right VIII: Hearing intact to voice IX,X: No hoarseness noted XI: Head rotates to left and right  XII: Tongue deviates slightly to right.  Motor: RUE: 2/5 deltoid, 3/5 biceps/triceps, 4/5 grip RLE: 4/5  proximal and distal LUE and LLE: 5/5 Decreased tone to RUE. Bulk normal x 4.  Sensory: Decreased temp sensation RUE and RLE. FT without extinction to DSS.  Deep Tendon Reflexes:  1+ right biceps/brachioradialis 2+ left biceps/brachioradialis 4+ right patella 2+ left patella 1+ bilateral achilles Plantars: Right: downgoing  Left: downgoing Cerebellar: Unable to perform FNF on right. No ataxia with FNF on left Gait: Deferred  Results for orders placed or  performed during the hospital encounter of 07/16/17 (from the past 48 hour(s))  Protime-INR     Status: None   Collection Time: 07/16/17  6:45 PM  Result Value Ref Range   Prothrombin Time 12.4 11.4 - 15.2 seconds   INR 0.94   APTT     Status: None   Collection Time: 07/16/17  6:45 PM  Result Value Ref Range   aPTT 32 24 - 36 seconds  CBC     Status: None   Collection Time: 07/16/17  6:45 PM  Result Value Ref Range   WBC 5.1 4.0 - 10.5 K/uL   RBC 4.31 3.87 - 5.11 MIL/uL   Hemoglobin 12.3 12.0 - 15.0 g/dL   HCT 36.7 36.0 - 46.0 %   MCV 85.2 78.0 - 100.0 fL   MCH 28.5 26.0 - 34.0 pg   MCHC 33.5 30.0 - 36.0 g/dL   RDW 14.2 11.5 - 15.5 %   Platelets 202 150 - 400 K/uL  Differential     Status: None   Collection Time: 07/16/17  6:45 PM  Result Value Ref Range   Neutrophils Relative % 44 %   Neutro Abs 2.2 1.7 - 7.7 K/uL   Lymphocytes Relative 44 %   Lymphs Abs 2.3 0.7 - 4.0 K/uL   Monocytes Relative 8 %   Monocytes Absolute 0.4 0.1 - 1.0 K/uL   Eosinophils Relative 3 %   Eosinophils Absolute 0.1 0.0 - 0.7 K/uL   Basophils Relative 1 %   Basophils Absolute 0.0 0.0 - 0.1 K/uL  Comprehensive metabolic panel     Status: Abnormal   Collection Time: 07/16/17  6:45 PM  Result Value Ref Range   Sodium 136 135 - 145 mmol/L   Potassium 3.9 3.5 - 5.1 mmol/L   Chloride 104 101 - 111 mmol/L   CO2 23 22 - 32 mmol/L   Glucose, Bld 97 65 - 99 mg/dL   BUN 12 6 - 20 mg/dL   Creatinine, Ser 0.94 0.44 - 1.00 mg/dL   Calcium 9.0 8.9 - 10.3 mg/dL   Total Protein 7.2 6.5 - 8.1 g/dL   Albumin 3.5 3.5 - 5.0 g/dL   AST 23 15 - 41 U/L   ALT 15 14 - 54 U/L   Alkaline Phosphatase 75 38 - 126 U/L   Total Bilirubin 1.0 0.3 - 1.2 mg/dL   GFR calc non Af Amer 57 (L) >60 mL/min   GFR calc Af Amer >60 >60 mL/min    Comment: (NOTE) The eGFR has been calculated using the CKD EPI equation. This calculation has not been validated in all clinical situations. eGFR's persistently <60 mL/min signify  possible Chronic Kidney Disease.    Anion gap 9 5 - 15  I-stat troponin, ED     Status: None   Collection Time: 07/16/17  6:57 PM  Result Value Ref Range   Troponin i, poc 0.00 0.00 - 0.08 ng/mL   Comment 3            Comment: Due to the release kinetics  of cTnI, a negative result within the first hours of the onset of symptoms does not rule out myocardial infarction with certainty. If myocardial infarction is still suspected, repeat the test at appropriate intervals.   I-Stat Chem 8, ED     Status: None   Collection Time: 07/16/17  6:59 PM  Result Value Ref Range   Sodium 142 135 - 145 mmol/L   Potassium 4.0 3.5 - 5.1 mmol/L   Chloride 108 101 - 111 mmol/L   BUN 12 6 - 20 mg/dL   Creatinine, Ser 0.90 0.44 - 1.00 mg/dL   Glucose, Bld 98 65 - 99 mg/dL   Calcium, Ion 1.17 1.15 - 1.40 mmol/L   TCO2 25 22 - 32 mmol/L   Hemoglobin 12.2 12.0 - 15.0 g/dL   HCT 36.0 36.0 - 46.0 %   Dg Chest 2 View  Result Date: 07/16/2017 CLINICAL DATA:  Altered mental status. EXAM: CHEST  2 VIEW COMPARISON:  July 11, 2017 FINDINGS: The heart size and mediastinal contours are within normal limits. Both lungs are clear. The visualized skeletal structures are unremarkable. IMPRESSION: No active cardiopulmonary disease. Electronically Signed   By: Dorise Bullion III M.D   On: 07/16/2017 19:38   Ct Head Wo Contrast  Result Date: 07/16/2017 CLINICAL DATA:  Slurred speech with right-sided weakness EXAM: CT HEAD WITHOUT CONTRAST TECHNIQUE: Contiguous axial images were obtained from the base of the skull through the vertex without intravenous contrast. COMPARISON:  None. FINDINGS: Brain: There is an area of decreased attenuation in the left centrum semi ovale consistent with acute to subacute ischemia. Mild basal ganglia calcifications are seen. No other areas of acute ischemia are noted. No findings to suggest acute hemorrhage are seen. Vascular: No hyperdense vessel or unexpected calcification. Skull:  Normal. Negative for fracture or focal lesion. Sinuses/Orbits: No acute finding. Other: None. IMPRESSION: Area of acute to subacute ischemia within the centrum semi ovale on the left. No other focal abnormality is noted. Critical Value/emergent results were called by telephone at the time of interpretation on 07/16/2017 at 7:52 pm to Dr. Marda Stalker , who verbally acknowledged these results. Electronically Signed   By: Inez Catalina M.D.   On: 07/16/2017 19:52    Assessment: 76 y.o. female presenting with subacute left centrum semiovale ischemic infarction.  1. Exam reveals deficits localizable to the left MCA territory. CT reveals an area of subacute ischemia within the centrum semiovale on the left. DDx for underlying etiology includes cardioembolic stroke, in situ thrombosis and artery-to-artery embolization.  2. Stroke Risk Factors - HTN, DM2, hypercholesterolemia  Plan: 1. HgbA1c, fasting lipid panel 2. MRI, MRA of the brain without contrast 3. TTE  4. Carotid dopplers 5. Cardiac telemetry 6. Switch ASA to Plavix for now. If significant intracranial atherosclerosis is seen on MRA, then dual antiplatelet therapy is indicated.  7. Start atorvastatin 40 mg po qd. Obtain baseline CK level 8. Telemetry monitoring 9. Frequent neuro checks 10. PT consult, OT consult, Speech consult 11. Permissive HTN x 24 hours  _0  signed: Dr. Kerney Elbe  07/16/2017, 9:05 PM

## 2017-07-16 NOTE — ED Provider Notes (Signed)
[facility] EMERGENCY DEPARTMENT Provider Note   CSN: [id number] Arrival date & time: 07/16/17  1800     History   Chief Complaint Chief Complaint  Patient presents with  . Stroke Symptoms    HPI Kristi David is a 76 y.o. female.  The history is provided by the patient, the EMS personnel, a relative and medical records. No language interpreter was used.  Neurologic Problem  This is a new problem. The current episode started more than 2 days ago. The problem occurs constantly. The problem has not changed since onset.Associated symptoms include shortness of breath. Pertinent negatives include no chest pain, no abdominal pain and no headaches. Nothing aggravates the symptoms. Nothing relieves the symptoms. She has tried nothing for the symptoms. The treatment provided no relief.    Past Medical History:  Diagnosis Date  . Anemia   . Arthritis    "legs, knees" (01/07/2016)  . GERD (gastroesophageal reflux disease)    no meds  . High cholesterol   . History of blood transfusion   . Hypertension   . Seasonal allergies   . Shortness of breath dyspnea    if too active  . Syncope    passed out and was hospitalized for a couple of days  . Type II diabetes mellitus (HCC)    does not take medicine diet controlled    Patient Active Problem List   Diagnosis Date Noted  . Arthritis of right hand 01/11/2017  . Osteoarthritis of right knee 01/06/2016  . Status post right partial knee replacement 01/06/2016    Past Surgical History:  Procedure Laterality Date  . BUNIONECTOMY Bilateral   . CATARACT EXTRACTION W/ INTRAOCULAR LENS  IMPLANT, BILATERAL Bilateral   . INGUINAL HERNIA REPAIR Right   . KNEE ARTHROSCOPY WITH EXCISION BAKER'S CYST Right   . MR LOWER LEG LEFT ([facility] HX) Left    after a break  . PARTIAL KNEE ARTHROPLASTY Right 01/06/2016   Procedure: RIGHT UNICOMPARTMENTAL KNEE ARTHROPLASTY;  Surgeon: Kathryne Hitch, MD;  Location: [facility] OR;   Service: Orthopedics;  Laterality: Right;  . VEIN LIGATION AND STRIPPING Bilateral     OB History    No data available       Home Medications    Prior to Admission medications   Medication Sig Start Date End Date Taking? Authorizing Provider  aspirin EC 325 MG EC tablet Take 1 tablet (325 mg total) by mouth 2 (two) times daily after a meal. 01/08/16   Kathryne Hitch, MD  diclofenac sodium (VOLTAREN) 1 % GEL Apply 2 g topically 4 (four) times daily. 01/11/17   Kathryne Hitch, MD  HORIZANT 600 MG TBCR take 1 tablet by mouth daily 03/31/17   Kathryne Hitch, MD  HORIZANT 600 MG TBCR take 1 tablet by mouth daily 05/31/17   Kathryne Hitch, MD  losartan (COZAAR) 50 MG tablet Take 50 mg by mouth daily.    [provider]  methocarbamol (ROBAXIN) 500 MG tablet TAKE 1 TABLET BY MOUTH EVERY 6 TO 8 HOURS IF NEEDED FOR SPASMS OR PAIN 05/26/17   Kirtland Bouchard, PA-C  metoprolol succinate (TOPROL-XL) 25 MG 24 hr tablet Take 25 mg by mouth daily.    [provider]  oxyCODONE (OXY IR/ROXICODONE) 5 MG immediate release tablet Take 1-2 tablets (5-10 mg total) by mouth every 4 (four) hours as needed for severe pain or breakthrough pain. 01/08/16   Kathryne Hitch, MD    Family History  No family history on file.  Social History Social History  Substance Use Topics  . Smoking status: Never Smoker  . Smokeless tobacco: Never Used  . Alcohol use No     Allergies   Oregano [origanum oil]; Peanut-containing drug products; and Tomato   Review of Systems Review of Systems  Constitutional: Negative for chills, diaphoresis, fatigue and fever.  HENT: Negative for congestion and rhinorrhea.   Eyes: Negative for visual disturbance.  Respiratory: Positive for shortness of breath. Negative for cough, chest tightness, wheezing and stridor.   Cardiovascular: Negative for chest pain and palpitations.  Gastrointestinal: Negative for abdominal pain,  constipation, diarrhea, nausea and vomiting.  Genitourinary: Negative for dysuria, flank pain and frequency.  Musculoskeletal: Negative for back pain, neck pain and neck stiffness.  Neurological: Positive for facial asymmetry, speech difficulty (slurred), weakness and numbness. Negative for syncope, light-headedness and headaches.  Psychiatric/Behavioral: Negative for agitation and confusion.  All other systems reviewed and are negative.    Physical Exam Updated Vital Signs BP (!) 205/86 (BP Location: Left Arm)   Pulse 78   Temp 98.5 F (36.9 C) (Oral)   Resp 20   SpO2 98%   Physical Exam  Constitutional: She appears well-developed and well-nourished. No distress.  HENT:  Head: Normocephalic and atraumatic.  Nose: Nose normal.  Mouth/Throat: Oropharynx is clear and moist. No oropharyngeal exudate.  Eyes: Pupils are equal, round, and reactive to light. Conjunctivae and EOM are normal.  Neck: Normal range of motion. Neck supple.  Cardiovascular: Normal rate and intact distal pulses.   No murmur heard. Pulmonary/Chest: Effort normal and breath sounds normal. No stridor. No respiratory distress. She has no wheezes. She has no rales. She exhibits no tenderness.  Abdominal: Soft. There is no tenderness.  Musculoskeletal: She exhibits no edema.  Neurological: She is alert. She is not disoriented. A cranial nerve deficit and sensory deficit is present. She exhibits abnormal muscle tone. GCS eye subscore is 4. GCS verbal subscore is 5. GCS motor subscore is 6.  Right-sided weakness of the face, right arm, and right leg.  Possible dysarthria but no a aphasia on my exam.  Skin: Skin is warm and dry. Capillary refill takes less than 2 seconds. No rash noted. No erythema.  Psychiatric: She has a normal mood and affect.  Nursing note and vitals reviewed.    ED Treatments / Results  Labs (all labs ordered are listed, but only abnormal results are displayed) Labs Reviewed  COMPREHENSIVE  METABOLIC PANEL - Abnormal; Notable for the following:       Result Value   GFR calc non Af Amer 57 (*)    All other components within normal limits  ETHANOL  PROTIME-INR  APTT  CBC  DIFFERENTIAL  RAPID URINE DRUG SCREEN, HOSP PERFORMED  URINALYSIS, ROUTINE W REFLEX MICROSCOPIC  I-STAT CHEM 8, ED  I-STAT TROPONIN, ED    EKG  EKG Interpretation  Date/Time:  Saturday July 16 2017 18:08:44 EDT Ventricular Rate:  71 PR Interval:    QRS Duration: 96 QT Interval:  447 QTC Calculation: 486 R Axis:   -57 Text Interpretation:  Sinus rhythm Probable left atrial enlargement Left anterior fascicular block Nonspecific T abnormalities, anterior leads Borderline prolonged QT interval When compared to prior, no significant changes seen.  No STEMI Confirmed by Theda Belfastegeler, Chris ([id number]) on 07/16/2017 6:42:07 PM       Radiology Dg Chest 2 View  Result Date: 07/16/2017 CLINICAL DATA:  Altered mental status. EXAM: CHEST  2 VIEW  COMPARISON:  July 11, 2017 FINDINGS: The heart size and mediastinal contours are within normal limits. Both lungs are clear. The visualized skeletal structures are unremarkable. IMPRESSION: No active cardiopulmonary disease. Electronically Signed   By: Gerome Sam III M.D   On: 07/16/2017 19:38   Ct Head Wo Contrast  Result Date: 07/16/2017 CLINICAL DATA:  Slurred speech with right-sided weakness EXAM: CT HEAD WITHOUT CONTRAST TECHNIQUE: Contiguous axial images were obtained from the base of the skull through the vertex without intravenous contrast. COMPARISON:  None. FINDINGS: Brain: There is an area of decreased attenuation in the left centrum semi ovale consistent with acute to subacute ischemia. Mild basal ganglia calcifications are seen. No other areas of acute ischemia are noted. No findings to suggest acute hemorrhage are seen. Vascular: No hyperdense vessel or unexpected calcification. Skull: Normal. Negative for fracture or focal lesion. Sinuses/Orbits: No  acute finding. Other: None. IMPRESSION: Area of acute to subacute ischemia within the centrum semi ovale on the left. No other focal abnormality is noted. Critical Value/emergent results were called by telephone at the time of interpretation on 07/16/2017 at 7:52 pm to Dr. Lynden Oxford , who verbally acknowledged these results. Electronically Signed   By: Alcide Clever M.D.   On: 07/16/2017 19:52    Procedures Procedures (including critical care time)  Medications Ordered in ED Medications  labetalol (NORMODYNE,TRANDATE) injection 10 mg (not administered)  hydrALAZINE (APRESOLINE) injection 5 mg (not administered)     Initial Impression / Assessment and Plan / ED Course  I have reviewed the triage vital signs and the nursing notes.  Pertinent labs & imaging results that were available during my care of the patient were reviewed by me and considered in my medical decision making (see chart for details).     Kristi David is a 76 y.o. female with a past medical history significant for hypertension, diabetes, GERD, and hypercholesterolemia who presents for neurologic deficits.  Patient brought in by EMS and is accompanied by son.  Son reports that last normal was Tuesday, 5 days ago.  Son reports that he spoke with her on the phone today and noticed she was having slurred speech prompting him to call EMS.  Patient lives at a facility called [facility].  She reports that she does not do when her back but is having right sided facial droop, right arm weakness, and right leg weakness.  Patient also has numbness in the right face, right arm, and right leg.  Difficult to assess coordination as the arm is too weak to do finger-nose-finger testing.  Patient does not have slurred speech on my exam and does not appear to have a aphasia.  Patient does report some shortness of breath but denies any chest pain, fevers, chills, or cough.  She denies any other infectious symptoms.  On my exam, patient has  right sided weakness in her face, right arm, and right leg.  Patient also has numbness on the right side including the face.  Patient's forehead is spared weakness.  Patient has normal extraocular movements.  Patient denies diplopia or vision abnormalities.  Patient's lungs were clear and chest was nontender.  Abdomen nontender.  No evidence of trauma on my exam.  Patient's blood pressure was over 200 systolic on arrival.  Patient was given labetalol during initial workup.  There is clinical concern for stroke however, as the last normal was 5 days ago, she is out of window for intervention.  Patient will have CT imaging, laboratory testing, and  neurology will be called.  Anticipate admission for presumptive stroke.  CT imaging showed concern for acute versus subacute stroke.  This fits with the patient's symptoms.  Next  Neurology was called who will see the patient.  They recommended admission to the hospitalist service for further stroke workup.  Hospitalist called and patient will be admitted for further management.   Final Clinical Impressions(s) / ED Diagnoses   Final diagnoses:  Facial droop  Right sided weakness  Right sided numbness    Clinical Impression: 1. Facial droop   2. Right sided weakness   3. Right sided numbness     Disposition: Admit to Hospitalist service    Josely Moffat, Canary Brim, MD 07/16/17 2258

## 2017-07-16 NOTE — ED Notes (Signed)
Got patient undress on the monitor patient is resting with family at bedside and call bell in reach 

## 2017-07-16 NOTE — H&P (Signed)
TRH H&P   Patient Demographics:    Kristi David, is a 76 y.o. female  MRN: [id number]   DOB - 09-Feb-1941  Admit Date - 07/16/2017  Outpatient Primary MD for the patient is Kathyrn Lass, MD  Referring MD/NP/PA: Titus Mould  Outpatient Specialists:    Patient coming from: [place]  Chief Complaint  Patient presents with  . Stroke Symptoms      HPI:    Kristi David  is a 76 y.o. female, w hypertension, hyperlipidemia, dm2, apparently c/o slurred speech and right sided weakness, starting Thursday. Friend of son apparently called her and thinks her speech was slightly off but didn't realize at the time its significant  Her son called tonight and recognized her slurred speech as a sign of stroke and called EMS.   In ED Wbc 5.1, Hgb 12.3, Plt 202 Bun12, Creatinine 0.94 INR 0.94  CXR no acute process EKG:  nsr at 70, Lad, prolonged QTc, , poor R progression   CT brain=> IMPRESSION: Area of acute to subacute ischemia within the centrum semi ovale on the left. No other focal abnormality is noted.  Pt will be admitted for r/o CVA      Review of systems:    In addition to the HPI above,  No Fever-chills, No Headache, No changes with Vision or hearing, No problems swallowing food or Liquids, No Chest pain, Cough or Shortness of Breath, No Abdominal pain, No Nausea or Vommitting, Bowel movements are regular, No Blood in stool or Urine, No dysuria, No new skin rashes or bruises, No new joints pains-aches,   No recent weight gain or loss, No polyuria, polydypsia or polyphagia, No significant Mental Stressors.  A full 10 point Review of Systems was done, except as stated above, all other Review of Systems were negative.   With Past History of the following :    Past Medical History:  Diagnosis Date  . Anemia   . Arthritis    "legs, knees" (01/07/2016)    . GERD (gastroesophageal reflux disease)    no meds  . High cholesterol   . History of blood transfusion   . Hypertension   . Seasonal allergies   . Shortness of breath dyspnea    if too active  . Syncope    passed out and was hospitalized for a couple of days  . Type II diabetes mellitus ([place])    does not take medicine diet controlled      Past Surgical History:  Procedure Laterality Date  . BUNIONECTOMY Bilateral   . CATARACT EXTRACTION W/ INTRAOCULAR LENS  IMPLANT, BILATERAL Bilateral   . INGUINAL HERNIA REPAIR Right   . KNEE ARTHROSCOPY WITH EXCISION BAKER'S CYST Right   . MR LOWER LEG LEFT ([place] HX) Left    after a break  . PARTIAL KNEE ARTHROPLASTY Right 01/06/2016   Procedure: RIGHT UNICOMPARTMENTAL KNEE ARTHROPLASTY;  Surgeon: Mcarthur Rossetti, MD;  Location: [place];  Service: Orthopedics;  Laterality: Right;  . VEIN LIGATION AND STRIPPING Bilateral       Social History:     Social History  Substance Use Topics  . Smoking status: Never Smoker  . Smokeless tobacco: Never Used  . Alcohol use No     Lives - at [facility] - walks by self   Family History :     Family History  Problem Relation Age of Onset  . Stroke Mother   . CAD Father       Home Medications:   Prior to Admission medications   Medication Sig Start Date End Date Taking? Authorizing Provider  aspirin EC 325 MG EC tablet Take 1 tablet (325 mg total) by mouth 2 (two) times daily after a meal. Patient taking differently: Take 325 mg by mouth 2 (two) times daily as needed for pain.  01/08/16  Yes Mcarthur Rossetti, MD  citalopram (CELEXA) 40 MG tablet Take 40 mg by mouth daily. 05/31/17  Yes [provider]  diclofenac sodium (VOLTAREN) 1 % GEL Apply 2 g topically 4 (four) times daily. Patient taking differently: Apply 1 application topically 4 (four) times daily as needed (pain).  01/11/17  Yes Mcarthur Rossetti, MD  HORIZANT 600  MG TBCR take 1 tablet by mouth daily Patient taking differently: take 1 tablet (600 mg) by mouth daily at bedtime 05/31/17  Yes Mcarthur Rossetti, MD  hyoscyamine (LEVSIN, ANASPAZ) 0.125 MG tablet Take 0.125 mg by mouth every 4 (four) hours as needed (abdominal pain).  05/31/17  Yes [provider]  losartan (COZAAR) 100 MG tablet Take 100 mg by mouth daily. 05/31/17  Yes [provider]  metoprolol succinate (TOPROL-XL) 25 MG 24 hr tablet Take 25 mg by mouth daily.   Yes [provider]  solifenacin (VESICARE) 10 MG tablet Take 10 mg by mouth daily as needed (frequent urination).    Yes [provider]  HORIZANT 600 MG TBCR take 1 tablet by mouth daily Patient not taking: Reported on 07/16/2017 03/31/17   Mcarthur Rossetti, MD  methocarbamol (ROBAXIN) 500 MG tablet TAKE 1 TABLET BY MOUTH EVERY 6 TO 8 HOURS IF NEEDED FOR SPASMS OR PAIN Patient not taking: Reported on 07/16/2017 05/26/17   Pete Pelt, PA-C  oxyCODONE (OXY IR/ROXICODONE) 5 MG immediate release tablet Take 1-2 tablets (5-10 mg total) by mouth every 4 (four) hours as needed for severe pain or breakthrough pain. Patient not taking: Reported on 07/16/2017 01/08/16   Mcarthur Rossetti, MD     Allergies:     Allergies  Allergen Reactions  . Oregano [Origanum Oil] Other (See Comments)    Congestion, stuffy nose  . Peanut-Containing Drug Products Other (See Comments)    Congestion, stuffy nose Can eat cooked peanuts if pre-med with benadryl  . Tomato Other (See Comments)    Congestion, stuffy nose     Physical Exam:   Vitals  Blood pressure (!) 145/85, pulse 70, temperature 98.1 F (36.7 C), resp. rate 19, SpO2 98 %.   1. General  lying in bed in NAD,   2. Normal affect and insight, Not Suicidal or Homicidal, Awake Alert, Oriented X 3.  3. +right facial droop,  Motor 5-/5 right prox and distal upper ext Motor 5-/5 right prox and distal lower ext  cn2-12 intact,  reflexes 2+ at elbow, 2++ right patella, 2+ left patellar downgoing toes  bilateraly  4. Ears and Eyes appear Normal, Conjunctivae clear, PERRLA. Moist Oral Mucosa.  5. Supple Neck, No JVD, No cervical lymphadenopathy appriciated, No Carotid Bruits.  6. Symmetrical Chest wall movement, Good air movement bilaterally, CTAB.  7. RRR, No Gallops, Rubs or Murmurs, No Parasternal Heave.  8. Positive Bowel Sounds, Abdomen Soft, No tenderness, No organomegaly appriciated,No rebound -guarding or rigidity.  9.  No Cyanosis, Normal Skin Turgor, No Skin Rash or Bruise.  10. Good muscle tone,  joints appear normal , no effusions, Normal ROM.  11. No Palpable Lymph Nodes in Neck or Axillae     Data Review:    CBC  Recent Labs Lab 07/16/17 1845 07/16/17 1859  WBC 5.1  --   HGB 12.3 12.2  HCT 36.7 36.0  PLT 202  --   MCV 85.2  --   MCH 28.5  --   MCHC 33.5  --   RDW 14.2  --   LYMPHSABS 2.3  --   MONOABS 0.4  --   EOSABS 0.1  --   BASOSABS 0.0  --    ------------------------------------------------------------------------------------------------------------------  Chemistries   Recent Labs Lab 07/16/17 1845 07/16/17 1859  NA 136 142  K 3.9 4.0  CL 104 108  CO2 23  --   GLUCOSE 97 98  BUN 12 12  CREATININE 0.94 0.90  CALCIUM 9.0  --   AST 23  --   ALT 15  --   ALKPHOS 75  --   BILITOT 1.0  --    ------------------------------------------------------------------------------------------------------------------ CrCl cannot be calculated (Unknown ideal weight.). ------------------------------------------------------------------------------------------------------------------ No results for input(s): TSH, T4TOTAL, T3FREE, THYROIDAB in the last 72 hours.  Invalid input(s): FREET3  Coagulation profile  Recent Labs Lab 07/16/17 1845  INR 0.94   ------------------------------------------------------------------------------------------------------------------- No  results for input(s): DDIMER in the last 72 hours. -------------------------------------------------------------------------------------------------------------------  Cardiac Enzymes No results for input(s): CKMB, TROPONINI, MYOGLOBIN in the last 168 hours.  Invalid input(s): CK ------------------------------------------------------------------------------------------------------------------ No results found for: BNP   ---------------------------------------------------------------------------------------------------------------  Urinalysis No results found for: COLORURINE, APPEARANCEUR, LABSPEC, PHURINE, GLUCOSEU, HGBUR, BILIRUBINUR, KETONESUR, PROTEINUR, UROBILINOGEN, NITRITE, LEUKOCYTESUR  ----------------------------------------------------------------------------------------------------------------   Imaging Results:    Dg Chest 2 View  Result Date: 07/16/2017 CLINICAL DATA:  Altered mental status. EXAM: CHEST  2 VIEW COMPARISON:  July 11, 2017 FINDINGS: The heart size and mediastinal contours are within normal limits. Both lungs are clear. The visualized skeletal structures are unremarkable. IMPRESSION: No active cardiopulmonary disease. Electronically Signed   By: Dorise Bullion III M.D   On: 07/16/2017 19:38   Ct Head Wo Contrast  Result Date: 07/16/2017 CLINICAL DATA:  Slurred speech with right-sided weakness EXAM: CT HEAD WITHOUT CONTRAST TECHNIQUE: Contiguous axial images were obtained from the base of the skull through the vertex without intravenous contrast. COMPARISON:  None. FINDINGS: Brain: There is an area of decreased attenuation in the left centrum semi ovale consistent with acute to subacute ischemia. Mild basal ganglia calcifications are seen. No other areas of acute ischemia are noted. No findings to suggest acute hemorrhage are seen. Vascular: No hyperdense vessel or unexpected calcification. Skull: Normal. Negative for fracture or focal lesion. Sinuses/Orbits:  No acute finding. Other: None. IMPRESSION: Area of acute to subacute ischemia within the centrum semi ovale on the left. No other focal abnormality is noted. Critical Value/emergent results were called by telephone at the time of interpretation on 07/16/2017 at 7:52 pm to Dr. Marda Stalker , who verbally acknowledged these results. Electronically Signed   By: Elta Guadeloupe  Lukens M.D.   On: 07/16/2017 19:52       Assessment & Plan:    Principal Problem:   Stroke [facility]) Active Problems:   Hypertension    Stroke Tele, trop I q6h x3 Check hga1c, lipid,  Check b12, folate, esr, ana, tsh MRI/ MRA brain Carotid ultrasound Cardiac echo Pt failed bedside swallow, await speech therapy NPO  PT/OT consult Pt was not taking aspirin prior to CVA, due to GI upset Start aspirin 350m po qday or 3032mpr qday  Hypertension  No need for premissive hypertension since pt has had symptoms for 1+ week per neurology note Start metoprolol 2.82m96mv q6h  Hydralazine 82mg27m q6h prn sbp >160  Dm2 fsbs q4h , ISS  Hyperlipidemia LDL 151 per last office note this past week Pt need statin once able to swallow safely  Dysphagia Speech therapy to evaluate  DVT Prophylaxis SCDs   AM Labs Ordered, also please review Full Orders  Family Communication: Admission, patients condition and plan of care including tests being ordered have been discussed with the patient  who indicate understanding and agree with the plan and Code Status.  Code Status FULL CODE  Likely DC to  home  Condition GUARDED    Consults called: neurology by ED  Admission status: inpatient  Time spent in minutes : 45   JameJani Gravel on 07/16/2017 at 11:35 PM  Between 7am to 7pm - Pager - [phone] 7pm go to [email or web address] - password [email or web address] - Office  [phone]

## 2017-07-16 NOTE — ED Notes (Signed)
Neurologist at bedside. 

## 2017-07-16 NOTE — ED Notes (Signed)
Hold labetalol for current BP per MD

## 2017-07-17 ENCOUNTER — Inpatient Hospital Stay (HOSPITAL_COMMUNITY): Payer: Medicare Other

## 2017-07-17 ENCOUNTER — Encounter (HOSPITAL_COMMUNITY): Payer: Self-pay | Admitting: Radiology

## 2017-07-17 DIAGNOSIS — I639 Cerebral infarction, unspecified: Secondary | ICD-10-CM

## 2017-07-17 DIAGNOSIS — I361 Nonrheumatic tricuspid (valve) insufficiency: Secondary | ICD-10-CM

## 2017-07-17 DIAGNOSIS — I340 Nonrheumatic mitral (valve) insufficiency: Secondary | ICD-10-CM

## 2017-07-17 DIAGNOSIS — E785 Hyperlipidemia, unspecified: Secondary | ICD-10-CM

## 2017-07-17 LAB — COMPREHENSIVE METABOLIC PANEL
ALBUMIN: 3.2 g/dL — AB (ref 3.5–5.0)
ALK PHOS: 71 U/L (ref 38–126)
ALT: 14 U/L (ref 14–54)
AST: 21 U/L (ref 15–41)
Anion gap: 10 (ref 5–15)
BUN: 10 mg/dL (ref 6–20)
CALCIUM: 8.7 mg/dL — AB (ref 8.9–10.3)
CHLORIDE: 110 mmol/L (ref 101–111)
CO2: 21 mmol/L — AB (ref 22–32)
CREATININE: 0.87 mg/dL (ref 0.44–1.00)
GFR calc non Af Amer: >60 mL/min (ref >60)
GLUCOSE: 91 mg/dL (ref 65–99)
Potassium: 3.3 mmol/L — ABNORMAL LOW (ref 3.5–5.1)
SODIUM: 141 mmol/L (ref 135–145)
Total Bilirubin: 1.2 mg/dL (ref 0.3–1.2)
Total Protein: 6.8 g/dL (ref 6.5–8.1)

## 2017-07-17 LAB — LIPID PANEL
CHOL/HDL RATIO: 4.3 ratio
CHOLESTEROL: 250 mg/dL — AB (ref 0–200)
HDL: 58 mg/dL (ref >40)
LDL Cholesterol: 175 mg/dL — ABNORMAL HIGH (ref 0–99)
Triglycerides: 85 mg/dL (ref <150)
VLDL: 17 mg/dL (ref 0–40)

## 2017-07-17 LAB — CBC
HCT: 34.8 % — ABNORMAL LOW (ref 36.0–46.0)
HEMOGLOBIN: 11.5 g/dL — AB (ref 12.0–15.0)
MCH: 27.9 pg (ref 26.0–34.0)
MCHC: 33 g/dL (ref 30.0–36.0)
MCV: 84.5 fL (ref 78.0–100.0)
PLATELETS: 200 10*3/uL (ref 150–400)
RBC: 4.12 MIL/uL (ref 3.87–5.11)
RDW: 14.2 % (ref 11.5–15.5)
WBC: 5.3 10*3/uL (ref 4.0–10.5)

## 2017-07-17 LAB — URINALYSIS, ROUTINE W REFLEX MICROSCOPIC
Bilirubin Urine: NEGATIVE
Glucose, UA: NEGATIVE mg/dL
Hgb urine dipstick: NEGATIVE
KETONES UR: 20 mg/dL — AB
NITRITE: NEGATIVE
PH: 7 (ref 5.0–8.0)
Protein, ur: NEGATIVE mg/dL
Specific Gravity, Urine: 1.01 (ref 1.005–1.030)
Squamous Epithelial / LPF: NONE SEEN

## 2017-07-17 LAB — GLUCOSE, CAPILLARY
GLUCOSE-CAPILLARY: 101 mg/dL — AB (ref 65–99)
Glucose-Capillary: 105 mg/dL — ABNORMAL HIGH (ref 65–99)
Glucose-Capillary: 116 mg/dL — ABNORMAL HIGH (ref 65–99)
Glucose-Capillary: 94 mg/dL (ref 65–99)
Glucose-Capillary: 94 mg/dL (ref 65–99)

## 2017-07-17 LAB — ECHOCARDIOGRAM COMPLETE
HEIGHTINCHES: 67 in
WEIGHTICAEL: 3104.08 [oz_av]

## 2017-07-17 LAB — RAPID URINE DRUG SCREEN, HOSP PERFORMED
Amphetamines: NOT DETECTED
Barbiturates: NOT DETECTED
Benzodiazepines: NOT DETECTED
Cocaine: NOT DETECTED
Opiates: NOT DETECTED
Tetrahydrocannabinol: NOT DETECTED

## 2017-07-17 LAB — TROPONIN I
Troponin I: <0.03 ng/mL (ref <0.03)
Troponin I: <0.03 ng/mL (ref <0.03)

## 2017-07-17 LAB — VITAMIN B12: Vitamin B-12: 1269 pg/mL — ABNORMAL HIGH (ref 180–914)

## 2017-07-17 LAB — HEMOGLOBIN A1C
HEMOGLOBIN A1C: 5.5 % (ref 4.8–5.6)
MEAN PLASMA GLUCOSE: 111.15 mg/dL

## 2017-07-17 LAB — SEDIMENTATION RATE: SED RATE: 82 mm/h — AB (ref 0–22)

## 2017-07-17 LAB — TSH: TSH: 2.933 u[IU]/mL (ref 0.350–4.500)

## 2017-07-17 MED ORDER — METOPROLOL TARTRATE 5 MG/5ML IV SOLN
2.5000 mg | Freq: Four times a day (QID) | INTRAVENOUS | Status: DC
  Administered 2017-07-17 (×2): 2.5 mg via INTRAVENOUS
  Filled 2017-07-17 (×2): qty 5

## 2017-07-17 MED ORDER — CLOPIDOGREL BISULFATE 75 MG PO TABS
75.0000 mg | ORAL_TABLET | Freq: Every day | ORAL | Status: DC
  Administered 2017-07-18: 75 mg via ORAL
  Filled 2017-07-17: qty 1

## 2017-07-17 MED ORDER — ACETAMINOPHEN 650 MG RE SUPP: 650.0000 mg | RECTAL | Status: DC | PRN

## 2017-07-17 MED ORDER — SODIUM CHLORIDE 0.9 % IV SOLN
INTRAVENOUS | Status: AC
  Administered 2017-07-17: 01:00:00 via INTRAVENOUS

## 2017-07-17 MED ORDER — ACETAMINOPHEN 325 MG PO TABS
650.0000 mg | ORAL_TABLET | ORAL | Status: DC | PRN
  Administered 2017-07-17 (×2): 650 mg via ORAL
  Filled 2017-07-17 (×2): qty 2

## 2017-07-17 MED ORDER — DICLOFENAC SODIUM 1 % TD GEL: 2.0000 g | Freq: Four times a day (QID) | TRANSDERMAL | Status: DC | PRN

## 2017-07-17 MED ORDER — METOPROLOL SUCCINATE ER 25 MG PO TB24
25.0000 mg | ORAL_TABLET | Freq: Every day | ORAL | Status: DC
  Administered 2017-07-17 – 2017-07-18 (×2): 25 mg via ORAL
  Filled 2017-07-17 (×2): qty 1

## 2017-07-17 MED ORDER — ATORVASTATIN CALCIUM 80 MG PO TABS
80.0000 mg | ORAL_TABLET | Freq: Every day | ORAL | Status: DC
  Administered 2017-07-17: 80 mg via ORAL
  Filled 2017-07-17: qty 1

## 2017-07-17 MED ORDER — INSULIN ASPART 100 UNIT/ML ~~LOC~~ SOLN: 0.0000 [IU] | Freq: Three times a day (TID) | SUBCUTANEOUS | Status: DC

## 2017-07-17 MED ORDER — ASPIRIN 300 MG RE SUPP: 300.0000 mg | Freq: Every day | RECTAL | Status: DC

## 2017-07-17 MED ORDER — ASPIRIN 325 MG PO TABS
325.0000 mg | ORAL_TABLET | Freq: Every day | ORAL | Status: DC
  Administered 2017-07-17: 325 mg via ORAL
  Filled 2017-07-17: qty 1

## 2017-07-17 MED ORDER — STROKE: EARLY STAGES OF RECOVERY BOOK: Freq: Once | Status: DC

## 2017-07-17 MED ORDER — IOPAMIDOL (ISOVUE-370) INJECTION 76%
INTRAVENOUS | Status: AC
  Administered 2017-07-17: 50 mL
  Filled 2017-07-17: qty 50

## 2017-07-17 MED ORDER — ACETAMINOPHEN 160 MG/5ML PO SOLN: 650.0000 mg | ORAL | Status: DC | PRN

## 2017-07-17 MED ORDER — LOSARTAN POTASSIUM 50 MG PO TABS
100.0000 mg | ORAL_TABLET | Freq: Every day | ORAL | Status: DC
  Administered 2017-07-17 – 2017-07-18 (×2): 100 mg via ORAL
  Filled 2017-07-17 (×2): qty 2

## 2017-07-17 NOTE — Progress Notes (Signed)
CTA neck performed 10/21. Please advise if carotid duplex still needed.  Vascular lab [phone]

## 2017-07-17 NOTE — Progress Notes (Signed)
Pt went off the floor at 0135 to MRI and just return to the Unit .

## 2017-07-17 NOTE — Progress Notes (Signed)
Physical Therapy Evaluation Patient Details Name: Kristi David MRN: [id number] DOB: Feb 13, 1941 Today's Date: 07/17/2017   History of Present Illness  Pt is 76 y.o. female presented to the ED with R sided weakness and slurred speech. PMH significant of HTN, hyperlipidemia, and Type II DM. MRI revealing: Acute/early subacute infarction involving left posterior putamen, caudate body, and corona radiata measuring 3.5 cc.   Clinical Impression  Patient presents with the above. Patient evaluated and noted findings below (see PT Problem List). Prior to admission, patient was independent with ADLs and mobility. Patient is demonstrating generalized weakness and instability with mobility tasks at this time. Patient has good family support and is eager to return to prior level of independence. Feel CIR is appropriate for intensive therapies to address deficits and maximize function. PT will continue to follow acutely and progress as tolerated.     Follow Up Recommendations CIR    Equipment Recommendations  None recommended by PT    Recommendations for Other Services Rehab consult     Precautions / Restrictions Precautions Precautions: Fall Restrictions Weight Bearing Restrictions: No      Mobility  Bed Mobility               General bed mobility comments: pt sitting EOB upon arrival  Transfers Overall transfer level: Needs assistance Equipment used: Rolling walker (2 wheeled) Transfers: Sit to/from Stand Sit to Stand: Min guard         General transfer comment: cueing for UE placement and min guard for safety when standing.   Ambulation/Gait Ambulation/Gait assistance: Min guard Ambulation Distance (Feet): 80 Feet Assistive device: Rolling walker (2 wheeled) (briefly used initially) Gait Pattern/deviations: Step-to pattern;Decreased stride length;Decreased dorsiflexion - right;Drifts right/left Gait velocity: decreased Gait velocity interpretation: Below normal speed for  age/gender General Gait Details: ambulation around the room with increased difficulty. Patient running into doors, counter, needing cues for attention to R side. Pt demonstrates instability with walking needing min guard assistance for stability. R LE weakness evident and demonstrates decreased R DF and decreased ability to advance R LE forward properly. unsteady cadence and gait speed changes noted with head turns and obstacles in the hallway  Stairs            Wheelchair Mobility    Modified Rankin (Stroke Patients Only) Modified Rankin (Stroke Patients Only) Pre-Morbid Rankin Score: No symptoms Modified Rankin: Moderate disability     Balance Overall balance assessment: Needs assistance Sitting-balance support: Feet supported;No upper extremity supported Sitting balance-Leahy Scale: Good     Standing balance support: During functional activity;No upper extremity supported Standing balance-Leahy Scale: Fair                               Pertinent Vitals/Pain Pain Assessment: No/denies pain    Home Living Family/patient expects to be discharged to:: Private residence Living Arrangements: Alone Available Help at Discharge: Family;Available PRN/intermittently Type of Home: Independent living facility (apartment) Home Access: Elevator     Home Layout: One level Home Equipment: Shower seat;Bedside commode;Grab bars - tub/shower      Prior Function Level of Independence: Independent               Hand Dominance   Dominant Hand: Right    Extremity/Trunk Assessment   Upper Extremity Assessment Upper Extremity Assessment: Defer to OT evaluation    Lower Extremity Assessment Lower Extremity Assessment: RLE deficits/detail;LLE deficits/detail RLE Deficits / Details: strength 4-/5. sensation intact  to LT  LLE Deficits / Details: sensation intact to LT; strength 4-/5       Communication   Communication: Expressive difficulties (slurred speech  evident)  Cognition Arousal/Alertness: Awake/alert Behavior During Therapy: WFL for tasks assessed/performed Overall Cognitive Status: Impaired/Different from baseline Area of Impairment: Following commands;Safety/judgement;Awareness;Problem solving                       Following Commands: Follows one step commands consistently;Follows multi-step commands inconsistently Safety/Judgement: Decreased awareness of safety;Decreased awareness of deficits Awareness: Intellectual Problem Solving: Slow processing;Requires verbal cues;Requires tactile cues;Difficulty sequencing        General Comments General comments (skin integrity, edema, etc.): Son present throughout session    Exercises     Assessment/Plan    PT Assessment Patient needs continued PT services  PT Problem List Decreased strength;Decreased balance;Decreased mobility;Decreased cognition;Decreased knowledge of use of DME;Decreased safety awareness       PT Treatment Interventions Stair training;Gait training;Therapeutic activities;Functional mobility training;Therapeutic exercise;Balance training;Neuromuscular re-education;Patient/family education    PT Goals (Current goals can be found in the Care Plan section)  Acute Rehab PT Goals Patient Stated Goal: to go home and regain independence PT Goal Formulation: With patient/family Time For Goal Achievement: 07/31/17 Potential to Achieve Goals: Good    Frequency Min 3X/week   Barriers to discharge        Co-evaluation               AM-PAC PT "6 Clicks" Daily Activity  Outcome Measure Difficulty turning over in bed (including adjusting bedclothes, sheets and blankets)?: A Lot Difficulty moving from lying on back to sitting on the side of the bed? : A Lot Difficulty sitting down on and standing up from a chair with arms (e.g., wheelchair, bedside commode, etc,.)?: A Lot Help needed moving to and from a bed to chair (including a wheelchair)?: A  Lot Help needed walking in hospital room?: A Lot Help needed climbing 3-5 steps with a railing? : A Lot 6 Click Score: 12    End of Session Equipment Utilized During Treatment: Gait belt Activity Tolerance: Patient tolerated treatment well Patient left: in bed;with call bell/phone within reach;with family/visitor present Nurse Communication: Mobility status PT Visit Diagnosis: Hemiplegia and hemiparesis;Unsteadiness on feet (R26.81);Other abnormalities of gait and mobility (R26.89) Hemiplegia - Right/Left: Right Hemiplegia - dominant/non-dominant: Dominant Hemiplegia - caused by: Cerebral infarction    Time: 0940-1001 PT Time Calculation (min) (ACUTE ONLY): 21 min   Charges:   PT Evaluation $PT Eval Moderate Complexity: 1 Mod     PT G CodesMckinley Jewel:        A. Jamila Wilford [place], SPT [phone] office   Fonnie BirkenheadAndria J  Sharvil Hoey 07/17/2017, 10:30 AM

## 2017-07-17 NOTE — Progress Notes (Signed)
Rehab Admissions Coordinator Note:  Patient was screened by Kristi David, Kristi David for appropriateness for an Inpatient Acute Rehab Consult per PT and OT recommendation.  At this time, we are recommending Inpatient Rehab consult. Please place an order if pt would like to be considered for admit.  Kristi David, Kristi David 07/17/2017, 1:20 PM  I can be reached at [phone].

## 2017-07-17 NOTE — Progress Notes (Signed)
PROGRESS NOTE    Kristi David  JXB:[id number] DOB: 03/10/1941 DOA: 07/16/2017 PCP: Sigmund HazelMiller, Lisa, MD     Brief Narrative:  76 y/o woman admitted from home on 10/20 due to slurred speech. Found to have an acute CVA an admission requested.   Assessment & Plan:   Principal Problem:   Stroke [facility](HCC) Active Problems:   Hypertension   Acute/Subacute CVA -MRI: acute/early subacute infarction of the left posterior putamen, caudate body and corona radiata. -ASA has been upgraded to plavix for secondary stroke prevention. -ECHO: EF 55-60%, grade 1 diastolic dysfunction, no WMA. Mild MR, mild TR. -Carotid Dopplers: ordered with results pending. -LDL 175; has been started on atorvastatin. -ST initially recommended NPO but after MBS D2 with nectar thick liquids. Diet requested. -PT/OT are recommending CIR; consult requested. -Neurology following.  Dysphagia -Post CVA. -D2 nectar thick per ST after MBS.  HTN -Fair control. -Will not overshoot treatment given acute CVA.  DM II -Well controlled. -Continue current regimen.  Hyperlipidemia -On statin.   DVT prophylaxis: SCDs Code Status: Full Code Family Communication: Friend at bedside updated on plan of care and all questions answered Disposition Plan: Likely CIR vs SNF in 24 hours  Consultants:   Neurology  Procedures:   As above  Antimicrobials:  Anti-infectives    None       Subjective: Feels ok, no CP/SOB.  Objective: Vitals:   07/17/17 0248 07/17/17 0400 07/17/17 0600 07/17/17 1004  BP: (!) 171/82 (!) 158/71 137/79 (!) 153/72  Pulse:  83 91 85  Resp:  17 16 18   Temp:  98.8 F (37.1 C) 98.6 F (37 C) 98.6 F (37 C)  TempSrc:  Oral Oral Oral  SpO2:  100% 100% 100%  Weight:      Height:        Intake/Output Summary (Last 24 hours) at 07/17/17 1406 Last data filed at 07/17/17 0600  Gross per 24 hour  Intake              365 ml  Output              600 ml  Net             -235 ml   Filed  Weights   07/17/17 0000  Weight: 88 kg (194 lb 0.1 oz)    Examination:  General exam: Alert, awake, oriented x 3, seems adequately depressed at severe deficits following stroke. Respiratory system: Clear to auscultation. Respiratory effort normal. Cardiovascular system:RRR. No murmurs, rubs, gallops. Gastrointestinal system: Abdomen is nondistended, soft and nontender. No organomegaly or masses felt. Normal bowel sounds heard. Central nervous system: Alert and oriented. Severe RUE>RLE weakness, right facial droop and slurred speech. Extremities: No C/C/E, +pedal pulses Skin: No rashes, lesions or ulcers Psychiatry: Judgement and insight appear normal. Mood & affect is depressed.    Data Reviewed: I have personally reviewed following labs and imaging studies  CBC:  Recent Labs Lab 07/16/17 1845 07/16/17 1859 07/17/17 0658  WBC 5.1  --  5.3  NEUTROABS 2.2  --   --   HGB 12.3 12.2 11.5*  HCT 36.7 36.0 34.8*  MCV 85.2  --  84.5  PLT 202  --  200   Basic Metabolic Panel:  Recent Labs Lab 07/16/17 1845 07/16/17 1859 07/17/17 0658  NA 136 142 141  K 3.9 4.0 3.3*  CL 104 108 110  CO2 23  --  21*  GLUCOSE 97 98 91  BUN 12 12 10   CREATININE  0.94 0.90 0.87  CALCIUM 9.0  --  8.7*   GFR: Estimated Creatinine Clearance: 60.7 mL/min (by C-G formula based on SCr of 0.87 mg/dL). Liver Function Tests:  Recent Labs Lab 07/16/17 1845 07/17/17 0658  AST 23 21  ALT 15 14  ALKPHOS 75 71  BILITOT 1.0 1.2  PROT 7.2 6.8  ALBUMIN 3.5 3.2*   No results for input(s): LIPASE, AMYLASE in the last 168 hours. No results for input(s): AMMONIA in the last 168 hours. Coagulation Profile:  Recent Labs Lab 07/16/17 1845  INR 0.94   Cardiac Enzymes:  Recent Labs Lab 07/16/17 2359 07/17/17 0658  TROPONINI <0.03 <0.03   BNP (last 3 results) No results for input(s): PROBNP in the last 8760 hours. HbA1C:  Recent Labs  07/17/17 0658  HGBA1C 5.5   CBG:  Recent  Labs Lab 07/17/17 0031 07/17/17 0421 07/17/17 0710  GLUCAP 94 105* 101*   Lipid Profile:  Recent Labs  07/17/17 0658  CHOL 250*  HDL 58  LDLCALC 175*  TRIG 85  CHOLHDL 4.3   Thyroid Function Tests:  Recent Labs  07/17/17 0658  TSH 2.933   Anemia Panel:  Recent Labs  07/17/17 0658  VITAMINB12 1,269*   Urine analysis:    Component Value Date/Time   COLORURINE YELLOW 07/17/2017 0738   APPEARANCEUR HAZY (A) 07/17/2017 0738   LABSPEC 1.010 07/17/2017 0738   PHURINE 7.0 07/17/2017 0738   GLUCOSEU NEGATIVE 07/17/2017 0738   HGBUR NEGATIVE 07/17/2017 0738   BILIRUBINUR NEGATIVE 07/17/2017 0738   KETONESUR 20 (A) 07/17/2017 0738   PROTEINUR NEGATIVE 07/17/2017 0738   NITRITE NEGATIVE 07/17/2017 0738   LEUKOCYTESUR MODERATE (A) 07/17/2017 0738   Sepsis Labs: @LABRCNTIP (procalcitonin:4,lacticidven:4)  )No results found for this or any previous visit (from the past 240 hour(s)).       Radiology Studies: Dg Chest 2 View  Result Date: 07/16/2017 CLINICAL DATA:  Altered mental status. EXAM: CHEST  2 VIEW COMPARISON:  July 11, 2017 FINDINGS: The heart size and mediastinal contours are within normal limits. Both lungs are clear. The visualized skeletal structures are unremarkable. IMPRESSION: No active cardiopulmonary disease. Electronically Signed   By: Gerome Sam III M.D   On: 07/16/2017 19:38   Ct Head Wo Contrast  Result Date: 07/16/2017 CLINICAL DATA:  Slurred speech with right-sided weakness EXAM: CT HEAD WITHOUT CONTRAST TECHNIQUE: Contiguous axial images were obtained from the base of the skull through the vertex without intravenous contrast. COMPARISON:  None. FINDINGS: Brain: There is an area of decreased attenuation in the left centrum semi ovale consistent with acute to subacute ischemia. Mild basal ganglia calcifications are seen. No other areas of acute ischemia are noted. No findings to suggest acute hemorrhage are seen. Vascular: No hyperdense  vessel or unexpected calcification. Skull: Normal. Negative for fracture or focal lesion. Sinuses/Orbits: No acute finding. Other: None. IMPRESSION: Area of acute to subacute ischemia within the centrum semi ovale on the left. No other focal abnormality is noted. Critical Value/emergent results were called by telephone at the time of interpretation on 07/16/2017 at 7:52 pm to Dr. Lynden Oxford , who verbally acknowledged these results. Electronically Signed   By: Alcide Clever M.D.   On: 07/16/2017 19:52   Ct Angio Neck W Or Wo Contrast  Result Date: 07/17/2017 CLINICAL DATA:  Acute LEFT MCA territory lenticulostriate infarct. Continued surveillance. EXAM: CT ANGIOGRAPHY NECK TECHNIQUE: Multidetector CT imaging of the neck was performed using the standard protocol during bolus administration of intravenous contrast. Multiplanar  CT image reconstructions and MIPs were obtained to evaluate the vascular anatomy. Carotid stenosis measurements (when applicable) are obtained utilizing NASCET criteria, using the distal internal carotid diameter as the denominator. CONTRAST:  Isovue 370, 50 mL. COMPARISON:  MRI brain 07/17/2017.  CT head 07/16/2017. FINDINGS: Aortic arch: Dolichoectasia.  No proximal stenosis. Right carotid system: No evidence of dissection, stenosis (50% or greater) or occlusion. Minor atheromatous change. Left carotid system: No evidence of dissection, stenosis (50% or greater) or occlusion. Minor atheromatous change. Vertebral arteries: Codominant. No evidence of dissection, stenosis (50% or greater) or occlusion. Skeleton: Spondylosis.  Edentulous. Other neck: No masses. Upper chest: No pneumothorax or nodules. IMPRESSION: Minor carotid bifurcation atherosclerosis. No extracranial flow reducing lesion lesion or dissection. Electronically Signed   By: Elsie Stain M.D.   On: 07/17/2017 13:10   Mr Brain Wo Contrast  Result Date: 07/17/2017 CLINICAL DATA:  76 y/o F; slurred speech and  right-sided weakness. Abnormal CT of head. EXAM: MRI HEAD WITHOUT CONTRAST MRA HEAD WITHOUT CONTRAST TECHNIQUE: Multiplanar, multiecho pulse sequences of the brain and surrounding structures were obtained without intravenous contrast. Angiographic images of the head were obtained using MRA technique without contrast. COMPARISON:  07/16/2017 CT head FINDINGS: MRI HEAD FINDINGS Brain: Focus of reduced diffusion compatible with acute/ early subacute infarction involving left posterior putamen, caudate body, corona radiata measuring 2.8 x 1.5 x 1.6 cm (volume = 3.5 cm^3)(AP x ML x CC series 3, image 28 and series 5, image 18). The hemorrhage has associated T2 FLAIR hyperintensity. No hemorrhage or significant mass effect. Few nonspecific foci of T2 FLAIR hyperintense signal abnormality in subcortical and periventricular white matter are compatible with mild chronic microvascular ischemic changes for age. Mild brain parenchymal volume loss. No hydrocephalus, extra-axial collection, or effacement of basilar cisterns. Vascular: As below. Skull and upper cervical spine: Normal marrow signal. Sinuses/Orbits: Negative. Other: None. MRA HEAD FINDINGS Anterior circulation: No large vessel occlusion, aneurysm, or significant stenosis is identified. Posterior circulation: No large vessel occlusion, aneurysm, or significant stenosis is identified. Anatomic variation: Bilateral fetal PCA and patent anterior communicating artery. IMPRESSION: 1. Acute/early subacute infarction involving left posterior putamen, caudate body, and corona radiata measuring 3.5 cc. No acute hemorrhage or mass effect. 2. Background of mild chronic microvascular ischemic changes and mild parenchymal volume loss of the brain. 3. Patent circle of Willis. No large vessel occlusion, aneurysm, or significant stenosis is identified. These results will be called to the ordering clinician or representative by the Radiologist Assistant, and communication documented  in the PACS or zVision Dashboard. Electronically Signed   By: Mitzi Hansen M.D.   On: 07/17/2017 02:33   Dg Swallowing Func-speech Pathology  Result Date: 07/17/2017 Please refer to "Notes" tab for Speech Pathology notes.  Mr Shirlee Latch ZO Contrast  Result Date: 07/17/2017 CLINICAL DATA:  76 y/o F; slurred speech and right-sided weakness. Abnormal CT of head. EXAM: MRI HEAD WITHOUT CONTRAST MRA HEAD WITHOUT CONTRAST TECHNIQUE: Multiplanar, multiecho pulse sequences of the brain and surrounding structures were obtained without intravenous contrast. Angiographic images of the head were obtained using MRA technique without contrast. COMPARISON:  07/16/2017 CT head FINDINGS: MRI HEAD FINDINGS Brain: Focus of reduced diffusion compatible with acute/ early subacute infarction involving left posterior putamen, caudate body, corona radiata measuring 2.8 x 1.5 x 1.6 cm (volume = 3.5 cm^3)(AP x ML x CC series 3, image 28 and series 5, image 18). The hemorrhage has associated T2 FLAIR hyperintensity. No hemorrhage or significant mass effect. Few nonspecific  foci of T2 FLAIR hyperintense signal abnormality in subcortical and periventricular white matter are compatible with mild chronic microvascular ischemic changes for age. Mild brain parenchymal volume loss. No hydrocephalus, extra-axial collection, or effacement of basilar cisterns. Vascular: As below. Skull and upper cervical spine: Normal marrow signal. Sinuses/Orbits: Negative. Other: None. MRA HEAD FINDINGS Anterior circulation: No large vessel occlusion, aneurysm, or significant stenosis is identified. Posterior circulation: No large vessel occlusion, aneurysm, or significant stenosis is identified. Anatomic variation: Bilateral fetal PCA and patent anterior communicating artery. IMPRESSION: 1. Acute/early subacute infarction involving left posterior putamen, caudate body, and corona radiata measuring 3.5 cc. No acute hemorrhage or mass effect. 2.  Background of mild chronic microvascular ischemic changes and mild parenchymal volume loss of the brain. 3. Patent circle of Willis. No large vessel occlusion, aneurysm, or significant stenosis is identified. These results will be called to the ordering clinician or representative by the Radiologist Assistant, and communication documented in the PACS or zVision Dashboard. Electronically Signed   By: Mitzi Hansen M.D.   On: 07/17/2017 02:33        Scheduled Meds: .  stroke: mapping our early stages of recovery book   Does not apply Once  . aspirin  300 mg Rectal Daily   Or  . aspirin  325 mg Oral Daily  . atorvastatin  80 mg Oral q1800  . insulin aspart  0-9 Units Subcutaneous TID WC  . labetalol  10 mg Intravenous Once  . metoprolol tartrate  2.5 mg Intravenous Q6H   Continuous Infusions: . sodium chloride 75 mL/hr at 07/17/17 0108     LOS: 1 day    Time spent: 35 minutes. Greater than 50% of this time was spent in direct contact with the patient coordinating care.     Chaya Jan, MD [facility] Pager [phone]  If 7PM-7AM, please contact night-coverage [email or web address] Password [email or web address] 07/17/2017, 2:06 PM

## 2017-07-17 NOTE — Progress Notes (Signed)
Echocardiogram 2D Echocardiogram has been performed.  Kristi David 07/17/2017, 1:11 PM

## 2017-07-17 NOTE — Progress Notes (Signed)
Modified Barium Swallow Progress Note  Patient Details  Name: Kristi David MRN: [id number] Date of Birth: 05/15/1941  Today's Date: 07/17/2017  Modified Barium Swallow completed.  Full report located under Chart Review in the Imaging Section.  Brief recommendations include the following:  Clinical Impression  Patient presents with moderate oral and mild-moderate pharyngeal dysphagia characterized by both motor and sensory deficits. Deviation in swallow physiology include right-sided oral and lingual weakness impacting oral control, manipulation and clearance. Pt noted with oral holding, particularly of larger, liquid boluses prior to swallow initiation, intermittent premature spillage and anterior spillage of thin liquids. Oral residue intermittently requiring verbal cues to sweep tongue. Pharyngeal stage characterized by mildly reduced tongue base retraction, leading to mild residue at the base of tongue and in the valleculae intermittently, solids >liquids. Pt sensed residue and completed a dry swallow without cues 75% of the time, required verbal cue 25% of the time. Swallow initiation delayed to pyriform sinuses with thin and nectar thick liquids. This resulted in trace silent aspiration of thin liquids during the swallow due to delayed closure of the laryngeal vestibule. There was also laryngeal penetration of larger sips of nectar. Chin tuck was effective in preventing penetration of nectar thick liquids consitently over several trials, even with larger straw sips. It did not prevent penetration of thin liquids. Cervical esophageal phase notable for bony protrusions from anterior spine which had minimal impact on clearance; intermittent trace residual. Recommend initiating dys 2 diet with nectar thick liquids with chin tuck, meds whole with puree. Educated pt and her son re: results and recommendations using video feedback, teachback for swallowing strategies. Will follow for tolerance,  advancement, repeat MBS as appropriate.   Swallow Evaluation Recommendations       SLP Diet Recommendations: Dysphagia 2 (Fine chop) solids;Nectar thick liquid   Liquid Administration via: Cup;Straw   Medication Administration: Whole meds with puree   Supervision: Patient able to self feed;Full supervision/cueing for compensatory strategies   Compensations: Slow rate;Small sips/bites;Minimize environmental distractions;Lingual sweep for clearance of pocketing;Chin tuck   Postural Changes: Seated upright at 90 degrees   Oral Care Recommendations: Oral care BID   Other Recommendations: Order thickener from pharmacy   Rondel BatonMary Beth Caral Whan, MS, CCC-SLP Speech-Language Pathologist [phone]  [name] 07/17/2017,2:19 PM

## 2017-07-17 NOTE — Evaluation (Signed)
Clinical/Bedside Swallow Evaluation Patient Details  Name: Kristi David MRN: [id number] Date of Birth: 03/19/1941  Today's Date: 07/17/2017 Time: SLP Start Time (ACUTE ONLY): 0843 SLP Stop Time (ACUTE ONLY): 0855 SLP Time Calculation (min) (ACUTE ONLY): 12 min  Past Medical History:  Past Medical History:  Diagnosis Date  . Anemia   . Arthritis    "legs, knees" (01/07/2016)  . GERD (gastroesophageal reflux disease)    no meds  . High cholesterol   . History of blood transfusion   . Hypertension   . Seasonal allergies   . Shortness of breath dyspnea    if too active  . Syncope    passed out and was hospitalized for a couple of days  . Type II diabetes mellitus (HCC)    does not take medicine diet controlled   Past Surgical History:  Past Surgical History:  Procedure Laterality Date  . BUNIONECTOMY Bilateral   . CATARACT EXTRACTION W/ INTRAOCULAR LENS  IMPLANT, BILATERAL Bilateral   . INGUINAL HERNIA REPAIR Right   . KNEE ARTHROSCOPY WITH EXCISION BAKER'S CYST Right   . MR LOWER LEG LEFT ([facility] HX) Left    after a break  . PARTIAL KNEE ARTHROPLASTY Right 01/06/2016   Procedure: RIGHT UNICOMPARTMENTAL KNEE ARTHROPLASTY;  Surgeon: Kathryne Hitchhristopher Y Blackman, MD;  [facility];  Service: Orthopedics;  Laterality: Right;  . VEIN LIGATION AND STRIPPING Bilateral    HPI:  Kristi Dorseyis an 76 y.o.femalewho presents to the ED from her independent living facility for evaluation of right sided weakness with facial droop which started on Thursday. The patient'sson called hertoday and noted slurred speech. On presentation to the ED, the patient was visibly weak on the right, also with slurred speech and right facial droop. She has no prior history of stroke. Denies confusion, trouble getting words out or difficulty comprehending speech. CT in the ED reveals an area of subacute ischemia within the centrum semiovale on the left. MRI showed Acute/early subacute infarction involving left  posterior putamen, caudate body, and corona radiata measuring 3.5 cc.   Assessment / Plan / Recommendation Clinical Impression  Patient presents with right sided facial and lingual weakness, reduced ROM, contributing to oral and suspected pharyngeal dysphagia. She presents with clinical signs of aspiration of thin and nectar thick liquids, including immediate coughing, throat clearing with single sips of thin liquids, immediate throat clearing after multiple sips of nectar. Anterior loss with thin liquids and involuntary expectoration due to reduced labial seal. Suspect delayed swallow initiation or perhaps premature spillage of liquids leading to airway compromise. Oral control/clearance and timing for pureed solids appears adequate. Recommend NPO with the exception of meds crushed in puree until instrumental assessment (MBS) can be completed later today. Patient in agreement with this plan. Additional recommendations to follow.  SLP Visit Diagnosis: Dysphagia, oropharyngeal phase (R13.12)    Aspiration Risk  Moderate aspiration risk    Diet Recommendation NPO except meds   Medication Administration: Crushed with puree    Other  Recommendations Oral Care Recommendations: Oral care QID   Follow up Recommendations Other (comment) (TBD)      Frequency and Duration            Prognosis Prognosis for Safe Diet Advancement: Good      Swallow Study   General Date of Onset: 07/16/17 HPI: Kristi Dorseyis an 76 y.o.femalewho presents to the ED from her independent living facility for evaluation of right sided weakness with facial droop which started on Thursday. The patient'sson called  hertoday and noted slurred speech. On presentation to the ED, the patient was visibly weak on the right, also with slurred speech and right facial droop. She has no prior history of stroke. Denies confusion, trouble getting words out or difficulty comprehending speech. CT in the ED reveals an area of subacute  ischemia within the centrum semiovale on the left. MRI showed Acute/early subacute infarction involving left posterior putamen, caudate body, and corona radiata measuring 3.5 cc. Type of Study: Bedside Swallow Evaluation Previous Swallow Assessment: none in chart Diet Prior to this Study: NPO Temperature Spikes Noted: No Respiratory Status: Room air History of Recent Intubation: No Behavior/Cognition: Alert;Cooperative;Pleasant mood Oral Care Completed by SLP: No Oral Cavity - Dentition: Dentures, top;Dentures, bottom Vision: Functional for self-feeding Self-Feeding Abilities: Able to feed self Patient Positioning: Upright in bed Baseline Vocal Quality: Low vocal intensity Volitional Cough: Strong Volitional Swallow: Able to elicit    Oral/Motor/Sensory Function Overall Oral Motor/Sensory Function: Moderate impairment Facial ROM: Reduced right;Suspected CN VII (facial) dysfunction Facial Symmetry: Abnormal symmetry right;Suspected CN VII (facial) dysfunction Facial Strength: Reduced right;Suspected CN VII (facial) dysfunction Facial Sensation: Within Functional Limits Lingual ROM: Reduced left;Suspected CN XII (hypoglossal) dysfunction Lingual Symmetry: Suspected CN XII (hypoglossal) dysfunction;Other (Comment) (tongue deviates R) Lingual Strength: Reduced;Suspected CN XII (hypoglossal) dysfunction Lingual Sensation: Within Functional Limits Velum: Within Functional Limits Mandible: Within Functional Limits   Ice Chips Ice chips: Within functional limits   Thin Liquid Thin Liquid: Impaired Presentation: Cup;Straw;Self Fed Oral Phase Impairments: Reduced labial seal;Reduced lingual movement/coordination Oral Phase Functional Implications: Right anterior spillage;Other (comment) (reduced oral control, involuntary expectoration) Pharyngeal  Phase Impairments: Suspected delayed Swallow;Multiple swallows;Cough - Immediate;Throat Clearing - Delayed    Nectar Thick Nectar Thick Liquid:  Impaired Presentation: Cup;Spoon;Self Fed;Straw Pharyngeal Phase Impairments: Throat Clearing - Immediate   Honey Thick Honey Thick Liquid: Not tested   Puree Puree: Within functional limits Presentation: Spoon;Self Fed   Solid   GO   Solid: Not tested        Arlana Lindau 07/17/2017,9:12 AM   Rondel Baton, MS, CCC-SLP Speech-Language Pathologist [phone]

## 2017-07-17 NOTE — Progress Notes (Signed)
[facility] with stroke and rt sided weakness to room 3W 06 . Oriented pt and son to Nsg staff , room call bell to call for assist as needed. Safety and fall prevention education given to pt and family . Cardiac monitor in use and CMT called to complete verification.  Pt alert and oriented x 4  Pupils equal and reactive. Rt side ed weakness with facial droop noted. NPO as pt failed RN Bedside swallow eval.  PT NPO and this and all other plan of care /tests explained to Pt /Son. Pt denied pain nor any distress. RN will continue to monitor.

## 2017-07-17 NOTE — Progress Notes (Signed)
STROKE TEAM PROGRESS NOTE   HISTORY OF PRESENT ILLNESS (per record) Kristi David is an 76 y.o. female who presents to the ED from her independent living facility for evaluation of right sided weakness with facial droop which started on Thursday. The patient's son called her today and noted slurred speech.  On presentation to the ED, the patient was visibly weak on the right, also with slurred speech and right facial droop. She has no prior history of stroke. Denies confusion, trouble getting words out or difficulty comprehending speech. Home medications include ASA 325 mg po qd. CT in the ED reveals an area of subacute ischemia within the centrum semiovale on the left.  LSN: Thursday tPA Given: No: Out of time window   SUBJECTIVE (INTERVAL HISTORY) Her son and other family members are at the bedside.  She still has right facial droop and right hemiparesis arm worse than leg. She complains of heart palpitation after exertion. Not taking ASA at home due to hurting stomach. Denies smoking.    OBJECTIVE Temp:  [98.1 F (36.7 C)-99.1 F (37.3 C)] 98.6 F (37 C) (10/21 0600) Pulse Rate:  [65-91] 91 (10/21 0600) Cardiac Rhythm: Normal sinus rhythm (10/21 0400) Resp:  [14-21] 16 (10/21 0600) BP: (133-205)/(68-98) 137/79 (10/21 0600) SpO2:  [97 %-100 %] 100 % (10/21 0600) Weight:  [194 lb 0.1 oz (88 kg)] 194 lb 0.1 oz (88 kg) (10/21 0000)  CBC:   Recent Labs Lab 07/16/17 1845 07/16/17 1859 07/17/17 0658  WBC 5.1  --  5.3  NEUTROABS 2.2  --   --   HGB 12.3 12.2 11.5*  HCT 36.7 36.0 34.8*  MCV 85.2  --  84.5  PLT 202  --  200    Basic Metabolic Panel:   Recent Labs Lab 07/16/17 1845 07/16/17 1859 07/17/17 0658  NA 136 142 141  K 3.9 4.0 3.3*  CL 104 108 110  CO2 23  --  21*  GLUCOSE 97 98 91  BUN 12 12 10   CREATININE 0.94 0.90 0.87  CALCIUM 9.0  --  8.7*    Lipid Panel:     Component Value Date/Time   CHOL 250 (H) 07/17/2017 0658   TRIG 85 07/17/2017 0658   HDL 58  07/17/2017 0658   CHOLHDL 4.3 07/17/2017 0658   VLDL 17 07/17/2017 0658   LDLCALC 175 (H) 07/17/2017 0658   HgbA1c:  Lab Results  Component Value Date   HGBA1C 5.2 12/31/2015   Urine Drug Screen:     Component Value Date/Time   LABOPIA NONE DETECTED 07/17/2017 0737   COCAINSCRNUR NONE DETECTED 07/17/2017 0737   LABBENZ NONE DETECTED 07/17/2017 0737   AMPHETMU NONE DETECTED 07/17/2017 0737   THCU NONE DETECTED 07/17/2017 0737   LABBARB NONE DETECTED 07/17/2017 0737    Alcohol Level     Component Value Date/Time   ETH <10 07/16/2017 2140    IMAGING I have personally reviewed the radiological images below and agree with the radiology interpretations.  Ct Head Wo Contrast 07/16/2017 IMPRESSION:  Area of acute to subacute ischemia within the centrum semi ovale on the left. No other focal abnormality is noted.   Mri and Mra Head Wo Contrast 07/17/2017 IMPRESSION:  1. Acute/early subacute infarction involving left posterior putamen, caudate body, and corona radiata measuring 3.5 cc. No acute hemorrhage or mass effect.  2. Background of mild chronic microvascular ischemic changes and mild parenchymal volume loss of the brain.  3. Patent circle of Willis. No large vessel occlusion,  aneurysm, or significant stenosis is identified.  Transthoracic Echocardiogram  - Upper normal LV wall thickness with LVEF 55-60% and grade 1   diastolic dysfunction. Mildly calcified mitral annulus with mild   mitral regurgitation. Trivial aortic regurgitation. Mild   tricuspid regurgitation with PASP 28 mmHg. No obvious PFO or ASD.  Lower Extremity Venous Dopplers -  There is no obvious evidence of DVT or SVT noted in the bilateral lower extremities.   CTA neck - Minor carotid bifurcation atherosclerosis. No extracranial flow reducing lesion lesion or dissection.   PHYSICAL EXAM  Vitals:   07/17/17 0215 07/17/17 0248 07/17/17 0400 07/17/17 0600  BP: (!) 171/82 (!) 171/82 (!) 158/71 137/79   Pulse: 70  83 91  Resp: 18  17 16   Temp: 98.7 F (37.1 C)  98.8 F (37.1 C) 98.6 F (37 C)  TempSrc: Oral  Oral Oral  SpO2: 100%  100% 100%  Weight:      Height:        General - Well nourished, well developed, in no apparent distress.  Ophthalmologic - Fundi not visualized due to small pupils.  Cardiovascular - Regular rate and rhythm.  Mental Status -  Level of arousal and orientation to time, place, and person were intact. Language including expression, naming, repetition, comprehension was assessed and found intact.  Cranial Nerves II - XII - II - Visual field intact OU. III, IV, VI - Extraocular movements intact. V - Facial sensation intact bilaterally. VII - right facial droop. VIII - Hearing & vestibular intact bilaterally. X - Palate elevates symmetrically. XI - Chin turning & shoulder shrug intact bilaterally. XII - Tongue protrusion intact.  Motor Strength - The patient's strength was normal in LUE and LLE, however, RUE 3-/5 and RLE 4+/5  Bulk was normal and fasciculations were absent.   Motor Tone - Muscle tone was assessed at the neck and appendages and was normal.  Reflexes - The patient's reflexes were 1+ in all extremities and she had no pathological reflexes.  Sensory - Light touch, temperature/pinprick were assessed and were decreased on the right, 50% of the left on the RUE, 80% of the left on the RLE.    Coordination - The patient had normal movements in the left hand with no ataxia or dysmetria.  Tremor was absent.  Gait and Station - deferred    ASSESSMENT/PLAN [name] is a 76 y.o. female with history of diabetes mellitus, syncope history, hypertension, hyperlipidemia, arthritis, and anemia presenting with right-sided weakness and speech difficulties. She did not receive IV t-PA due to late presentation.  Stroke:  left posterior putamen, caudate body, and corona radiata - likely small vessel disease. However, the size is larger than  typical lacunar stroke, will do 30 day cardiac event monitoring to rule out afib  Resultant  Right facial droop, right hemiparesis  CT head - Area of acute to subacute ischemia within the centrum semi ovale on the left.  MRI head - Acute/early subacute infarction involving left posterior putamen, caudate body, and corona radiata.  MRA head - no high-grade stenosis.  CTA Neck - mild athero at b/l ICA siphons  LE Venous Dopplers - no DVT  2D Echo - EF 55-60%  Due to infarct size larger than typical lacunar strokes, will recommend 30 day cardiac event monitoring at home to rule out afib.  LDL - 175  HgbA1c - 5.5  VTE prophylaxis - SCDs Diet NPO time specified  aspirin 325 mg daily prior to admission,  now on aspirin 325 mg daily. However, pt complains of ASA intolerance due to stomach ache. Will change to plavix daily.   Patient counseled to be compliant with her antithrombotic medications  Ongoing aggressive stroke risk factor management  Therapy recommendations: pending  Disposition: Pending  Hypertension  Stable  Permissive hypertension (OK if < 220/120) but gradually normalize in 5-7 days  Long-term BP goal normotensive  Hyperlipidemia  Home meds: No lipid lowering medications prior to admission  LDL 175, goal < 70  Now on Lipitor 80 mg daily  Continue statin at discharge  Diabetes  HgbA1c 5.5, goal < 7.0  Controlled  SSI  Other Stroke Risk Factors  Advanced age  Obesity, Body mass index is 30.39 kg/m., recommend weight loss, diet and exercise as appropriate   Other Active Problems  Hypokalemia - 3.3  Hospital day # 1  Neurology will sign off. Please call with questions. Pt will follow up with Darrol Angel, NP, at [facility] in about 6 weeks. Thanks for the consult.  Marvel Plan, MD PhD Stroke Neurology 07/17/2017 4:00 PM  To contact Stroke Continuity provider, please refer to [email or web address]. After hours, contact General Neurology

## 2017-07-17 NOTE — Progress Notes (Signed)
VASCULAR LAB PRELIMINARY  PRELIMINARY  PRELIMINARY  PRELIMINARY  Bilateral lower extremity venous duplex completed.    Preliminary report:  There is no obvious evidence of DVT or SVT noted in the bilateral lower extremities.   Cana Mignano, RVT 07/17/2017, 12:39 PM

## 2017-07-17 NOTE — Evaluation (Signed)
Occupational Therapy Evaluation Patient Details Name: Kristi David MRN: [id number] DOB: 1941-04-03 Today's Date: 07/17/2017    History of Present Illness Pt is 76 y.o. female presented to the ED with R sided weakness and slurred speech. PMH significant of HTN, hyperlipidemia, and Type II DM. MRI revealing: Acute/early subacute infarction involving left posterior putamen, caudate body, and corona radiata measuring 3.5 cc. PMH includes:  DM, HTN, h/o syncope, s/po Rt partial TKA, s/p cataract surgery   Clinical Impression   Pt admitted with above. She demonstrates the below listed deficits and will benefit from continued OT to maximize safety and independence with BADLs.  Pt presents to OT with Rt hemiparesis, Lt gaze preference, with very mild Rt inattention, impaired cognition, decreased balance.  She currently requires min - mod A for ADLs, and min A for functional mobility.  PTA, she lived alone and was fully independent with exception of driving.  Feel she would benefit from CIR to allow her to maximize safety and independent with ADLs and functional mobility in order to return home at supervision - mod I level.        Follow Up Recommendations  CIR    Equipment Recommendations  3 in 1 bedside commode    Recommendations for Other Services Rehab consult     Precautions / Restrictions Precautions Precautions: Fall Restrictions Weight Bearing Restrictions: No      Mobility Bed Mobility Overal bed mobility: Needs Assistance Bed Mobility: Sit to Supine       Sit to supine: Min assist   General bed mobility comments: assist for Rt LE   Transfers Overall transfer level: Needs assistance Equipment used: Rolling walker (2 wheeled) Transfers: Sit to/from Stand Sit to Stand: Min guard         General transfer comment: min guard to steady     Balance Overall balance assessment: Needs assistance Sitting-balance support: Feet supported;No upper extremity supported Sitting  balance-Leahy Scale: Good Sitting balance - Comments: able to don/doff socks without LOB    Standing balance support: During functional activity;No upper extremity supported Standing balance-Leahy Scale: Fair                             ADL either performed or assessed with clinical judgement   ADL Overall ADL's : Needs assistance/impaired Eating/Feeding: NPO   Grooming: Wash/dry hands;Wash/dry face;Oral care;Minimal assistance;Standing   Upper Body Bathing: Minimal assistance;Sitting   Lower Body Bathing: Moderate assistance;Sit to/from stand   Upper Body Dressing : Moderate assistance;Sitting   Lower Body Dressing: Moderate assistance;Sit to/from stand Lower Body Dressing Details (indicate cue type and reason): pt requires assist to pull socks over feet, due to Rt UE weakness.  She does attempt to use Rt UE to assist  Toilet Transfer: Minimal assistance;Ambulation;Comfort height toilet;Grab bars   Toileting- Clothing Manipulation and Hygiene: Moderate assistance;Sit to/from stand Toileting - Clothing Manipulation Details (indicate cue type and reason): assist to pull pants over Rt hip      Functional mobility during ADLs: Minimal assistance       Vision Baseline Vision/History: Wears glasses Wears Glasses: Reading only Patient Visual Report: Blurring of vision Vision Assessment?: Yes Eye Alignment: Within Functional Limits Ocular Range of Motion: Within Functional Limits Alignment/Gaze Preference: Within Defined Limits Tracking/Visual Pursuits: Able to track stimulus in all quads without difficulty Visual Fields: No apparent deficits     Perception Perception Perception Tested?: Yes Perception Deficits: Inattention/neglect Inattention/Neglect: Does not attend to right  visual field Comments: mild Rt inattention    Praxis Praxis Praxis tested?: Within functional limits    Pertinent Vitals/Pain Pain Assessment: No/denies pain     Hand Dominance  Right   Extremity/Trunk Assessment Upper Extremity Assessment Upper Extremity Assessment: RUE deficits/detail RUE Deficits / Details: Pt with Rt UE movment in [place] stage 3, beginning stage 4, and hand stage 5 (unable to fully flex digits).  She uses Rt UE as a gross assist during ADL activities    Lower Extremity Assessment Lower Extremity Assessment: Defer to PT evaluation RLE Deficits / Details: strength 4-/5. sensation intact to LT  LLE Deficits / Details: sensation intact to LT; strength 4-/5   Cervical / Trunk Assessment Cervical / Trunk Assessment: Other exceptions Cervical / Trunk Exceptions: Pt with Lt gaze preference.  Head/neck rotated to the Lt with Rt lateral flexion    Communication Communication Communication: Expressive difficulties (slurred speech evident)   Cognition Arousal/Alertness: Awake/alert Behavior During Therapy: WFL for tasks assessed/performed Overall Cognitive Status: Impaired/Different from baseline Area of Impairment: Following commands;Safety/judgement;Awareness;Problem solving;Attention                   Current Attention Level: Selective (with min cues )   Following Commands: Follows one step commands consistently;Follows multi-step commands inconsistently Safety/Judgement: Decreased awareness of safety;Decreased awareness of deficits Awareness: Intellectual Problem Solving: Slow processing;Requires verbal cues;Requires tactile cues;Difficulty sequencing General Comments: Pt initially states she doesn't know why she is in hospital, but later states, "they said I had a stroke"    General Comments  son present for most of session.  Discussed recommendation for post acute rehab, and pt agreeable     Exercises     Shoulder Instructions      Home Living Family/patient expects to be discharged to:: Private residence Living Arrangements: Alone Available Help at Discharge: Family;Available PRN/intermittently Type of Home: Independent  living facility (apartment) Home Access: Elevator     Home Layout: One level     Bathroom Shower/Tub: Producer, television/film/videoWalk-in shower   Bathroom Toilet: Standard     Home Equipment: Shower seat;Bedside commode;Grab bars - tub/shower          Prior Functioning/Environment Level of Independence: Independent        Comments: Pt does not drive, but does own grocery shopping, including carrying groceries to her apt         OT Problem List: Decreased strength;Decreased range of motion;Impaired balance (sitting and/or standing);Impaired vision/perception;Decreased coordination;Decreased cognition;Decreased safety awareness;Decreased knowledge of use of DME or AE;Impaired sensation;Impaired UE functional use      OT Treatment/Interventions: Self-care/ADL training;Neuromuscular education;DME and/or AE instruction;Manual therapy;Therapeutic activities;Cognitive remediation/compensation;Visual/perceptual remediation/compensation;Patient/family education;Balance training    OT Goals(Current goals can be found in the care plan section) Acute Rehab OT Goals Patient Stated Goal: To get back to normal  OT Goal Formulation: With patient Time For Goal Achievement: 07/31/17 Potential to Achieve Goals: Good ADL Goals Pt Will Perform Eating: with set-up;sitting Pt Will Perform Grooming: with min guard assist;standing Pt Will Perform Upper Body Bathing: with set-up;sitting Pt Will Perform Lower Body Bathing: with min guard assist;sit to/from stand Pt Will Perform Upper Body Dressing: with supervision;sitting Pt Will Perform Lower Body Dressing: with min guard assist;sit to/from stand Pt Will Transfer to Toilet: with min guard assist;ambulating;regular height toilet;bedside commode;grab bars Pt Will Perform Toileting - Clothing Manipulation and hygiene: with min guard assist;sit to/from stand Pt/caregiver will Perform Home Exercise Program: Increased strength;Increased ROM;Right Upper extremity;With  Supervision;With written HEP provided Additional ADL Goal #  1: Pt will use Rt UE as an active assist during ADL activities   OT Frequency: Min 2X/week   Barriers to D/C:            Co-evaluation              AM-PAC PT "6 Clicks" Daily Activity     Outcome Measure Help from another person eating meals?: Total Help from another person taking care of personal grooming?: A Little Help from another person toileting, which includes using toliet, bedpan, or urinal?: A Lot Help from another person bathing (including washing, rinsing, drying)?: A Lot Help from another person to put on and taking off regular upper body clothing?: A Lot Help from another person to put on and taking off regular lower body clothing?: A Lot 6 Click Score: 12   End of Session Nurse Communication: Mobility status  Activity Tolerance: Patient tolerated treatment well Patient left: in bed;with call bell/phone within reach;with bed alarm set;with nursing/sitter in room  OT Visit Diagnosis: Hemiplegia and hemiparesis Hemiplegia - Right/Left: Right Hemiplegia - dominant/non-dominant: Dominant Hemiplegia - caused by: Cerebral infarction                Time: 1003-1026 OT Time Calculation (min): 23 min Charges:  OT General Charges $OT Visit: 1 Visit OT Evaluation $OT Eval Moderate Complexity: 1 Mod OT Treatments $Self Care/Home Management : 8-22 mins G-Codes:     [employer], OTR/L [phone]   Catelyn Friel M 07/17/2017, 11:04 AM

## 2017-07-18 ENCOUNTER — Inpatient Hospital Stay (HOSPITAL_COMMUNITY)
Admission: RE | Admit: 2017-07-18 | Discharge: 2017-07-30 | DRG: 057 | Disposition: A | Discharge status: Home Health | Payer: Medicare Other | Source: Intra-hospital | Attending: Physical Medicine & Rehabilitation | Admitting: Physical Medicine & Rehabilitation

## 2017-07-18 ENCOUNTER — Encounter (HOSPITAL_COMMUNITY): Payer: Self-pay

## 2017-07-18 DIAGNOSIS — E785 Hyperlipidemia, unspecified: Secondary | ICD-10-CM | POA: Diagnosis present

## 2017-07-18 DIAGNOSIS — I10 Essential (primary) hypertension: Secondary | ICD-10-CM | POA: Diagnosis present

## 2017-07-18 DIAGNOSIS — R531 Weakness: Secondary | ICD-10-CM

## 2017-07-18 DIAGNOSIS — I69322 Dysarthria following cerebral infarction: Secondary | ICD-10-CM

## 2017-07-18 DIAGNOSIS — I69391 Dysphagia following cerebral infarction: Secondary | ICD-10-CM

## 2017-07-18 DIAGNOSIS — I5189 Other ill-defined heart diseases: Secondary | ICD-10-CM

## 2017-07-18 DIAGNOSIS — Z8249 Family history of ischemic heart disease and other diseases of the circulatory system: Secondary | ICD-10-CM

## 2017-07-18 DIAGNOSIS — I519 Heart disease, unspecified: Secondary | ICD-10-CM

## 2017-07-18 DIAGNOSIS — I69351 Hemiplegia and hemiparesis following cerebral infarction affecting right dominant side: Secondary | ICD-10-CM | POA: Diagnosis not present

## 2017-07-18 DIAGNOSIS — Z79899 Other long term (current) drug therapy: Secondary | ICD-10-CM | POA: Diagnosis not present

## 2017-07-18 DIAGNOSIS — D62 Acute posthemorrhagic anemia: Secondary | ICD-10-CM

## 2017-07-18 DIAGNOSIS — I63312 Cerebral infarction due to thrombosis of left middle cerebral artery: Secondary | ICD-10-CM

## 2017-07-18 DIAGNOSIS — R1312 Dysphagia, oropharyngeal phase: Secondary | ICD-10-CM | POA: Diagnosis present

## 2017-07-18 DIAGNOSIS — Z823 Family history of stroke: Secondary | ICD-10-CM

## 2017-07-18 DIAGNOSIS — Z96651 Presence of right artificial knee joint: Secondary | ICD-10-CM | POA: Diagnosis present

## 2017-07-18 DIAGNOSIS — E119 Type 2 diabetes mellitus without complications: Secondary | ICD-10-CM | POA: Diagnosis not present

## 2017-07-18 DIAGNOSIS — E876 Hypokalemia: Secondary | ICD-10-CM | POA: Diagnosis not present

## 2017-07-18 DIAGNOSIS — F329 Major depressive disorder, single episode, unspecified: Secondary | ICD-10-CM | POA: Diagnosis present

## 2017-07-18 DIAGNOSIS — Z7982 Long term (current) use of aspirin: Secondary | ICD-10-CM | POA: Diagnosis not present

## 2017-07-18 DIAGNOSIS — E1151 Type 2 diabetes mellitus with diabetic peripheral angiopathy without gangrene: Secondary | ICD-10-CM | POA: Diagnosis present

## 2017-07-18 DIAGNOSIS — I639 Cerebral infarction, unspecified: Secondary | ICD-10-CM

## 2017-07-18 DIAGNOSIS — E1142 Type 2 diabetes mellitus with diabetic polyneuropathy: Secondary | ICD-10-CM | POA: Diagnosis present

## 2017-07-18 DIAGNOSIS — R29810 Facial weakness: Secondary | ICD-10-CM

## 2017-07-18 DIAGNOSIS — E1159 Type 2 diabetes mellitus with other circulatory complications: Secondary | ICD-10-CM

## 2017-07-18 HISTORY — DX: Cerebral infarction, unspecified: I63.9

## 2017-07-18 LAB — BASIC METABOLIC PANEL
Anion gap: 11 (ref 5–15)
BUN: 10 mg/dL (ref 6–20)
CALCIUM: 8.7 mg/dL — AB (ref 8.9–10.3)
CO2: 20 mmol/L — ABNORMAL LOW (ref 22–32)
CREATININE: 0.82 mg/dL (ref 0.44–1.00)
Chloride: 108 mmol/L (ref 101–111)
GFR calc Af Amer: >60 mL/min (ref >60)
GLUCOSE: 94 mg/dL (ref 65–99)
POTASSIUM: 3.4 mmol/L — AB (ref 3.5–5.1)
SODIUM: 139 mmol/L (ref 135–145)

## 2017-07-18 LAB — GLUCOSE, CAPILLARY
GLUCOSE-CAPILLARY: 103 mg/dL — AB (ref 65–99)
Glucose-Capillary: 81 mg/dL (ref 65–99)
Glucose-Capillary: 90 mg/dL (ref 65–99)
Glucose-Capillary: 93 mg/dL (ref 65–99)

## 2017-07-18 LAB — CBC
HEMATOCRIT: 33.6 % — AB (ref 36.0–46.0)
Hemoglobin: 11.3 g/dL — ABNORMAL LOW (ref 12.0–15.0)
MCH: 29 pg (ref 26.0–34.0)
MCHC: 33.6 g/dL (ref 30.0–36.0)
MCV: 86.4 fL (ref 78.0–100.0)
PLATELETS: 164 10*3/uL (ref 150–400)
RBC: 3.89 MIL/uL (ref 3.87–5.11)
RDW: 14.5 % (ref 11.5–15.5)
WBC: 4.4 10*3/uL (ref 4.0–10.5)

## 2017-07-18 MED ORDER — DICLOFENAC SODIUM 1 % TD GEL: 2.0000 g | Freq: Four times a day (QID) | TRANSDERMAL | Status: DC | PRN

## 2017-07-18 MED ORDER — ATORVASTATIN CALCIUM 80 MG PO TABS
80.0000 mg | ORAL_TABLET | Freq: Every day | ORAL | Status: DC
  Administered 2017-07-18 – 2017-07-29 (×12): 80 mg via ORAL
  Filled 2017-07-18 (×12): qty 1

## 2017-07-18 MED ORDER — ACETAMINOPHEN 325 MG PO TABS
650.0000 mg | ORAL_TABLET | ORAL | Status: DC | PRN
  Administered 2017-07-19 – 2017-07-30 (×22): 650 mg via ORAL
  Filled 2017-07-18 (×22): qty 2

## 2017-07-18 MED ORDER — LOSARTAN POTASSIUM 50 MG PO TABS
100.0000 mg | ORAL_TABLET | Freq: Every day | ORAL | Status: DC
  Administered 2017-07-19 – 2017-07-30 (×12): 100 mg via ORAL
  Filled 2017-07-18 (×13): qty 2

## 2017-07-18 MED ORDER — SORBITOL 70 % SOLN
30.0000 mL | Freq: Every day | Status: DC | PRN
  Administered 2017-07-24: 30 mL via ORAL
  Filled 2017-07-18 (×2): qty 30

## 2017-07-18 MED ORDER — INSULIN ASPART 100 UNIT/ML ~~LOC~~ SOLN
0.0000 [IU] | Freq: Three times a day (TID) | SUBCUTANEOUS | Status: DC
Start: 2017-07-18 — End: 2017-07-30
  Administered 2017-07-22: 1 [IU] via SUBCUTANEOUS

## 2017-07-18 MED ORDER — ACETAMINOPHEN 650 MG RE SUPP: 650.0000 mg | RECTAL | Status: DC | PRN

## 2017-07-18 MED ORDER — METOPROLOL SUCCINATE ER 25 MG PO TB24
25.0000 mg | ORAL_TABLET | Freq: Every day | ORAL | Status: DC
  Administered 2017-07-19 – 2017-07-30 (×12): 25 mg via ORAL
  Filled 2017-07-18 (×13): qty 1

## 2017-07-18 MED ORDER — ACETAMINOPHEN 160 MG/5ML PO SOLN: 650.0000 mg | ORAL | Status: DC | PRN

## 2017-07-18 MED ORDER — CLOPIDOGREL BISULFATE 75 MG PO TABS
75.0000 mg | ORAL_TABLET | Freq: Every day | ORAL | Status: DC
  Administered 2017-07-19 – 2017-07-30 (×12): 75 mg via ORAL
  Filled 2017-07-18 (×13): qty 1

## 2017-07-18 MED ORDER — ONDANSETRON HCL 4 MG PO TABS
4.0000 mg | ORAL_TABLET | Freq: Four times a day (QID) | ORAL | Status: DC | PRN
  Administered 2017-07-22: 4 mg via ORAL
  Filled 2017-07-18: qty 1

## 2017-07-18 MED ORDER — CITALOPRAM HYDROBROMIDE 20 MG PO TABS
40.0000 mg | ORAL_TABLET | Freq: Every day | ORAL | Status: DC
  Administered 2017-07-18 – 2017-07-30 (×13): 40 mg via ORAL
  Filled 2017-07-18 (×14): qty 2

## 2017-07-18 MED ORDER — ONDANSETRON HCL 4 MG/2ML IJ SOLN: 4.0000 mg | Freq: Four times a day (QID) | INTRAMUSCULAR | Status: DC | PRN

## 2017-07-18 NOTE — Progress Notes (Signed)
  Speech Language Pathology Treatment: Dysphagia  Patient Details Name: Kristi David MRN: [id number] DOB: 08/30/1941 Today's Date: 07/18/2017 Time: [id number] SLP Time Calculation (min) (ACUTE ONLY): 12 min  Assessment / Plan / Recommendation Clinical Impression  Pt observed with a.m. meal; tolerated breakfast with one instance of immediate cough following bite of dys 2 texture (chopped sausage). Otherwise, tolerated meal with no difficulty or s/sx of aspiration. When prompted with question cue, pt verbalized recall of chin tuck recommendation with liquids and independently executed use of strategy when drinking. SLP facilitated continuation of compensatory strategy with minimal cues throughout duration of meal. Recommend continuation of Dys 2 diet with nectar-thick liquids and meds crushed in puree. Will continue to monitor for potential diet upgrade as pt's medical status improves.    HPI HPI: Kristi Dorseyis an 76 y.o.femalewho presents to the ED from her independent living facility for evaluation of right sided weakness with facial droop which started on Thursday. The patient'sson called hertoday and noted slurred speech. On presentation to the ED, the patient was visibly weak on the right, also with slurred speech and right facial droop. She has no prior history of stroke. Denies confusion, trouble getting words out or difficulty comprehending speech. CT in the ED reveals an area of subacute ischemia within the centrum semiovale on the left. MRI showed Acute/early subacute infarction involving left posterior putamen, caudate body, and corona radiata measuring 3.5 cc.      SLP Plan  Continue with current plan of care       Recommendations  Diet recommendations: Dysphagia 2 (fine chop);Nectar-thick liquid Liquids provided via: Cup;Straw Medication Administration: Crushed with puree Supervision: Patient able to self feed Compensations: Slow rate;Small sips/bites;Minimize environmental  distractions;Lingual sweep for clearance of pocketing;Chin tuck Postural Changes and/or Swallow Maneuvers: Seated upright 90 degrees;Upright 30-60 min after meal;Chin tuck                Oral Care Recommendations: Oral care BID Follow up Recommendations: Inpatient Rehab SLP Visit Diagnosis: Dysphagia, oropharyngeal phase (R13.12) Plan: Continue with current plan of care       GO                Kristi RimaAmanda Keirra David, Student SLP 07/18/2017, 9:35 AM

## 2017-07-18 NOTE — H&P (Signed)
Physical Medicine and Rehabilitation Admission H&P    Chief Complaint  Patient presents with  . Stroke Symptoms  : HPI: Kristi David is a 76 y.o. right handed female with history of hypertension, hyperlipidemia, diabetes mellitus. Per chart review, patient, and son, patient lives alone. Independent prior to admission but does not drive. One level apartment with elevator. Presented 07/16/2017 with right sided weakness and slurred speech. Cranial CT scan showed areas of acute to subacute ischemia within the centrum semiovale ovale on the left. No other focal abnormality noted. Patient did not receive TPA. MRI reviewed, showing left basal ganglia infarct. Per report, an acute early subacute infarction left posterior putamen, caudate body and corona radiata. MRA with no large vessel occlusion aneurysm or stenosis. CT angiogram of neck no extracranial flow reducing lesion or dissection identified. Lower extremity Dopplers negative for DVT. Echocardiogram with ejection fraction of 94% grade 1 diastolic dysfunction. Maintained on Plavix for CVA prophylaxis. Dysphagia #2 nectar thick liquid diet. Patient seen working with physical therapy with balance deficits and difficulties with motor planning. Physical and occupational therapy evaluations completed with recommendations of physical medicine rehabilitation consult. Patient was admitted for a comprehensive rehabilitation program  Review of Systems  Constitutional: Negative for chills and fever.  HENT: Negative for hearing loss.   Eyes: Negative for blurred vision and double vision.  Respiratory: Negative for cough.        Shortness of breath with exertion  Cardiovascular: Positive for leg swelling. Negative for chest pain and palpitations.  Gastrointestinal: Positive for constipation. Negative for nausea.       GERD  Genitourinary: Positive for urgency. Negative for dysuria and hematuria.  Musculoskeletal: Positive for back pain, joint pain and  myalgias.  Skin: Negative for rash.  Neurological: Positive for dizziness, speech change and focal weakness.  All other systems reviewed and are negative.  Past Medical History:  Diagnosis Date  . Anemia   . Arthritis    "legs, knees" (01/07/2016)  . GERD (gastroesophageal reflux disease)    no meds  . High cholesterol   . History of blood transfusion   . Hypertension   . Seasonal allergies   . Shortness of breath dyspnea    if too active  . Syncope    passed out and was hospitalized for a couple of days  . Type II diabetes mellitus ([place])    does not take medicine diet controlled   Past Surgical History:  Procedure Laterality Date  . BUNIONECTOMY Bilateral   . CATARACT EXTRACTION W/ INTRAOCULAR LENS  IMPLANT, BILATERAL Bilateral   . INGUINAL HERNIA REPAIR Right   . KNEE ARTHROSCOPY WITH EXCISION BAKER'S CYST Right   . MR LOWER LEG LEFT ([facility]) Left    after a break  . PARTIAL KNEE ARTHROPLASTY Right 01/06/2016   Procedure: RIGHT UNICOMPARTMENTAL KNEE ARTHROPLASTY;  Surgeon: Mcarthur Rossetti, MD;  Location: [place];  Service: Orthopedics;  Laterality: Right;  . VEIN LIGATION AND STRIPPING Bilateral    Family History  Problem Relation Age of Onset  . Stroke Mother   . CAD Father    Social History:  reports that she has never smoked. She has never used smokeless tobacco. She reports that she does not drink alcohol or use drugs. Allergies:  Allergies  Allergen Reactions  . Oregano [Origanum Oil] Other (See Comments)    Congestion, stuffy nose  . Peanut-Containing Drug Products Other (See Comments)    Congestion, stuffy nose Can eat cooked peanuts if pre-med  with benadryl  . Tomato Other (See Comments)    Congestion, stuffy nose   Medications Prior to Admission  Medication Sig Dispense Refill  . aspirin EC 325 MG EC tablet Take 1 tablet (325 mg total) by mouth 2 (two) times daily after a meal. (Patient taking differently: Take 325 mg by mouth 2 (two) times daily  as needed for pain. ) 30 tablet 0  . citalopram (CELEXA) 40 MG tablet Take 40 mg by mouth daily.  0  . diclofenac sodium (VOLTAREN) 1 % GEL Apply 2 g topically 4 (four) times daily. (Patient taking differently: Apply 1 application topically 4 (four) times daily as needed (pain). ) 3 Tube 3  . HORIZANT 600 MG TBCR take 1 tablet by mouth daily (Patient taking differently: take 1 tablet (600 mg) by mouth daily at bedtime) 60 tablet 3  . hyoscyamine (LEVSIN, ANASPAZ) 0.125 MG tablet Take 0.125 mg by mouth every 4 (four) hours as needed (abdominal pain).   0  . losartan (COZAAR) 100 MG tablet Take 100 mg by mouth daily.  0  . metoprolol succinate (TOPROL-XL) 25 MG 24 hr tablet Take 25 mg by mouth daily.    . solifenacin (VESICARE) 10 MG tablet Take 10 mg by mouth daily as needed (frequent urination).     Marland Kitchen HORIZANT 600 MG TBCR take 1 tablet by mouth daily (Patient not taking: Reported on 07/16/2017) 60 tablet 3  . methocarbamol (ROBAXIN) 500 MG tablet TAKE 1 TABLET BY MOUTH EVERY 6 TO 8 HOURS IF NEEDED FOR SPASMS OR PAIN (Patient not taking: Reported on 07/16/2017) 60 tablet 0  . oxyCODONE (OXY IR/ROXICODONE) 5 MG immediate release tablet Take 1-2 tablets (5-10 mg total) by mouth every 4 (four) hours as needed for severe pain or breakthrough pain. (Patient not taking: Reported on 07/16/2017) 60 tablet 0    Drug Regimen Review Drug regimen was reviewed and remains appropriate with no significant issues identified  Home: Home Living Family/patient expects to be discharged to:: Private residence Living Arrangements: Alone Available Help at Discharge: Family, Available PRN/intermittently Type of Home: Independent living facility Home Access: Elevator Home Layout: One level Bathroom Shower/Tub: Multimedia programmer: Standard Home Equipment: Civil engineer, contracting, Bedside commode, Grab bars - tub/shower  Lives With: Alone   Functional History: Prior Function Level of Independence:  Independent Comments: Pt does not drive, but does own grocery shopping, including carrying groceries to her apt   Functional Status:  Mobility: Bed Mobility Overal bed mobility: Needs Assistance Bed Mobility: Sit to Supine, Supine to Sit Supine to sit: Min assist Sit to supine: Min guard General bed mobility comments: Min A for trunk elevation and for RLE lift assist for return to supine.  Transfers Overall transfer level: Needs assistance Equipment used: Rolling walker (2 wheeled) Transfers: Sit to/from Stand Sit to Stand: Min guard General transfer comment: Min guard for steadying assist. Verbal cues for safe hand placement. Increased time and effort required to stand.  Ambulation/Gait Ambulation/Gait assistance: Min assist, Min guard Ambulation Distance (Feet): 25 Feet Assistive device: Rolling walker (2 wheeled) Gait Pattern/deviations: Step-to pattern, Decreased stride length, Decreased dorsiflexion - right, Drifts right/left General Gait Details: Gait within the room this session. Worked on visual scanning to the R during ambulation to increase awareness to R side, however, pt continues to run into obstacles on the R. Required cues for R step height and foot clearance secondary to decreased DF. During gait, IR MD entered room and pt's food delivered, so ambulation distance  shortened this session.  Gait velocity: Decreased Gait velocity interpretation: Below normal speed for age/gender    ADL: ADL Overall ADL's : Needs assistance/impaired Eating/Feeding: NPO Grooming: Wash/dry hands, Wash/dry face, Oral care, Minimal assistance, Standing Upper Body Bathing: Minimal assistance, Sitting Lower Body Bathing: Moderate assistance, Sit to/from stand Upper Body Dressing : Moderate assistance, Sitting Lower Body Dressing: Moderate assistance, Sit to/from stand Lower Body Dressing Details (indicate cue type and reason): pt requires assist to pull socks over feet, due to Rt UE weakness.   She does attempt to use Rt UE to assist  Toilet Transfer: Minimal assistance, Ambulation, Comfort height toilet, Grab bars Toileting- Clothing Manipulation and Hygiene: Moderate assistance, Sit to/from stand Toileting - Clothing Manipulation Details (indicate cue type and reason): assist to pull pants over Rt hip  Functional mobility during ADLs: Minimal assistance  Cognition: Cognition Overall Cognitive Status: Impaired/Different from baseline Arousal/Alertness: Awake/alert Orientation Level: Oriented X4 Attention: Sustained Sustained Attention: Appears intact Memory: Appears intact Awareness: Impaired Awareness Impairment: Emergent impairment Problem Solving: Impaired Problem Solving Impairment: Functional complex Executive Function: Self Correcting, Self Monitoring, Reasoning Reasoning: Impaired Reasoning Impairment: Functional basic Self Monitoring: Impaired Self Monitoring Impairment: Functional basic Self Correcting: Impaired Self Correcting Impairment: Functional complex Safety/Judgment: Impaired Cognition Arousal/Alertness: Awake/alert Behavior During Therapy: WFL for tasks assessed/performed Overall Cognitive Status: Impaired/Different from baseline Area of Impairment: Following commands, Safety/judgement, Awareness, Problem solving, Attention Current Attention Level: Selective Following Commands: Follows one step commands consistently, Follows multi-step commands inconsistently Safety/Judgement: Decreased awareness of safety, Decreased awareness of deficits Awareness: Intellectual Problem Solving: Slow processing, Requires verbal cues, Requires tactile cues, Difficulty sequencing General Comments: Pt initially states she doesn't know why she is in hospital, but later states, "they said I had a stroke"   Physical Exam: Blood pressure (!) 152/74, pulse 75, temperature 98.5 F (36.9 C), temperature source Oral, resp. rate 18, height '5\' 7"'  (1.702 m), weight 88 kg (194  lb 0.1 oz), SpO2 100 %. Physical Exam  Vitals reviewed. Constitutional: She appears well-developed and well-nourished.  HENT:  Head: Normocephalic and atraumatic.  Eyes: EOM are normal. Right eye exhibits no discharge. Left eye exhibits no discharge.  Neck: Normal range of motion. Neck supple. No thyromegaly present.  Cardiovascular: Normal rate, regular rhythm and normal heart sounds.   Respiratory: Effort normal and breath sounds normal. No respiratory distress.  GI: Soft. Bowel sounds are normal. She exhibits no distension.  Musculoskeletal: She exhibits no edema or tenderness.  Neurological: She is alert.  Makes eye contact with examiner.  Follows simple commands.  She needed some cues for her appropriate age and date of birth as well as the present year. Motor: LUE/LLE: 5/5 proximal to distal RUE: Shoulder abduction 2+/5 proximal to distal, elbow flex/ext 2/5, distally 2-/5 RLE: 4-/5 proximal to distal Sensation intact to light touch +Dysarthria   Skin: Skin is warm and dry.  Psychiatric: She has a normal mood and affect. Her behavior is normal.    Results for orders placed or performed during the hospital encounter of 07/16/17 (from the past 48 hour(s))  Protime-INR     Status: None   Collection Time: 07/16/17  6:45 PM  Result Value Ref Range   Prothrombin Time 12.4 11.4 - 15.2 seconds   INR 0.94   APTT     Status: None   Collection Time: 07/16/17  6:45 PM  Result Value Ref Range   aPTT 32 24 - 36 seconds  CBC     Status: None   Collection  Time: 07/16/17  6:45 PM  Result Value Ref Range   WBC 5.1 4.0 - 10.5 K/uL   RBC 4.31 3.87 - 5.11 MIL/uL   Hemoglobin 12.3 12.0 - 15.0 g/dL   HCT 36.7 36.0 - 46.0 %   MCV 85.2 78.0 - 100.0 fL   MCH 28.5 26.0 - 34.0 pg   MCHC 33.5 30.0 - 36.0 g/dL   RDW 14.2 11.5 - 15.5 %   Platelets 202 150 - 400 K/uL  Differential     Status: None   Collection Time: 07/16/17  6:45 PM  Result Value Ref Range   Neutrophils Relative % 44 %    Neutro Abs 2.2 1.7 - 7.7 K/uL   Lymphocytes Relative 44 %   Lymphs Abs 2.3 0.7 - 4.0 K/uL   Monocytes Relative 8 %   Monocytes Absolute 0.4 0.1 - 1.0 K/uL   Eosinophils Relative 3 %   Eosinophils Absolute 0.1 0.0 - 0.7 K/uL   Basophils Relative 1 %   Basophils Absolute 0.0 0.0 - 0.1 K/uL  Comprehensive metabolic panel     Status: Abnormal   Collection Time: 07/16/17  6:45 PM  Result Value Ref Range   Sodium 136 135 - 145 mmol/L   Potassium 3.9 3.5 - 5.1 mmol/L   Chloride 104 101 - 111 mmol/L   CO2 23 22 - 32 mmol/L   Glucose, Bld 97 65 - 99 mg/dL   BUN 12 6 - 20 mg/dL   Creatinine, Ser 0.94 0.44 - 1.00 mg/dL   Calcium 9.0 8.9 - 10.3 mg/dL   Total Protein 7.2 6.5 - 8.1 g/dL   Albumin 3.5 3.5 - 5.0 g/dL   AST 23 15 - 41 U/L   ALT 15 14 - 54 U/L   Alkaline Phosphatase 75 38 - 126 U/L   Total Bilirubin 1.0 0.3 - 1.2 mg/dL   GFR calc non Af Amer 57 (L) >60 mL/min   GFR calc Af Amer >60 >60 mL/min    Comment: (NOTE) The eGFR has been calculated using the CKD EPI equation. This calculation has not been validated in all clinical situations. eGFR's persistently <60 mL/min signify possible Chronic Kidney Disease.    Anion gap 9 5 - 15  I-stat troponin, ED     Status: None   Collection Time: 07/16/17  6:57 PM  Result Value Ref Range   Troponin i, poc 0.00 0.00 - 0.08 ng/mL   Comment 3            Comment: Due to the release kinetics of cTnI, a negative result within the first hours of the onset of symptoms does not rule out myocardial infarction with certainty. If myocardial infarction is still suspected, repeat the test at appropriate intervals.   I-Stat Chem 8, ED     Status: None   Collection Time: 07/16/17  6:59 PM  Result Value Ref Range   Sodium 142 135 - 145 mmol/L   Potassium 4.0 3.5 - 5.1 mmol/L   Chloride 108 101 - 111 mmol/L   BUN 12 6 - 20 mg/dL   Creatinine, Ser 0.90 0.44 - 1.00 mg/dL   Glucose, Bld 98 65 - 99 mg/dL   Calcium, Ion 1.17 1.15 - 1.40 mmol/L    TCO2 25 22 - 32 mmol/L   Hemoglobin 12.2 12.0 - 15.0 g/dL   HCT 36.0 36.0 - 46.0 %  Ethanol     Status: None   Collection Time: 07/16/17  9:40 PM  Result Value Ref  Range   Alcohol, Ethyl (B) <10 <10 mg/dL    Comment:        LOWEST DETECTABLE LIMIT FOR SERUM ALCOHOL IS 10 mg/dL FOR MEDICAL PURPOSES ONLY   Troponin I (q 6hr x 3)     Status: None   Collection Time: 07/16/17 11:59 PM  Result Value Ref Range   Troponin I <0.03 <0.03 ng/mL  Glucose, capillary     Status: None   Collection Time: 07/17/17 12:31 AM  Result Value Ref Range   Glucose-Capillary 94 65 - 99 mg/dL   Comment 1 Notify RN    Comment 2 Document in Chart   Glucose, capillary     Status: Abnormal   Collection Time: 07/17/17  4:21 AM  Result Value Ref Range   Glucose-Capillary 105 (H) 65 - 99 mg/dL   Comment 1 Notify RN    Comment 2 Document in Chart   Troponin I (q 6hr x 3)     Status: None   Collection Time: 07/17/17  6:58 AM  Result Value Ref Range   Troponin I <0.03 <0.03 ng/mL  Hemoglobin A1c     Status: None   Collection Time: 07/17/17  6:58 AM  Result Value Ref Range   Hgb A1c MFr Bld 5.5 4.8 - 5.6 %    Comment: (NOTE) Pre diabetes:          5.7%-6.4% Diabetes:              >6.4% Glycemic control for   <7.0% adults with diabetes    Mean Plasma Glucose 111.15 mg/dL  Lipid panel     Status: Abnormal   Collection Time: 07/17/17  6:58 AM  Result Value Ref Range   Cholesterol 250 (H) 0 - 200 mg/dL   Triglycerides 85 <150 mg/dL   HDL 58 >40 mg/dL   Total CHOL/HDL Ratio 4.3 RATIO   VLDL 17 0 - 40 mg/dL   LDL Cholesterol 175 (H) 0 - 99 mg/dL    Comment:        Total Cholesterol/HDL:CHD Risk Coronary Heart Disease Risk Table                     Men   Women  1/2 Average Risk   3.4   3.3  Average Risk       5.0   4.4  2 X Average Risk   9.6   7.1  3 X Average Risk  23.4   11.0        Use the calculated Patient Ratio above and the CHD Risk Table to determine the patient's CHD Risk.        ATP  III CLASSIFICATION (LDL):  <100     mg/dL   Optimal  100-129  mg/dL   Near or Above                    Optimal  130-159  mg/dL   Borderline  160-189  mg/dL   High  >190     mg/dL   Very High   CBC     Status: Abnormal   Collection Time: 07/17/17  6:58 AM  Result Value Ref Range   WBC 5.3 4.0 - 10.5 K/uL   RBC 4.12 3.87 - 5.11 MIL/uL   Hemoglobin 11.5 (L) 12.0 - 15.0 g/dL   HCT 34.8 (L) 36.0 - 46.0 %   MCV 84.5 78.0 - 100.0 fL   MCH 27.9 26.0 - 34.0 pg  MCHC 33.0 30.0 - 36.0 g/dL   RDW 14.2 11.5 - 15.5 %   Platelets 200 150 - 400 K/uL  Comprehensive metabolic panel     Status: Abnormal   Collection Time: 07/17/17  6:58 AM  Result Value Ref Range   Sodium 141 135 - 145 mmol/L   Potassium 3.3 (L) 3.5 - 5.1 mmol/L   Chloride 110 101 - 111 mmol/L   CO2 21 (L) 22 - 32 mmol/L   Glucose, Bld 91 65 - 99 mg/dL   BUN 10 6 - 20 mg/dL   Creatinine, Ser 0.87 0.44 - 1.00 mg/dL   Calcium 8.7 (L) 8.9 - 10.3 mg/dL   Total Protein 6.8 6.5 - 8.1 g/dL   Albumin 3.2 (L) 3.5 - 5.0 g/dL   AST 21 15 - 41 U/L   ALT 14 14 - 54 U/L   Alkaline Phosphatase 71 38 - 126 U/L   Total Bilirubin 1.2 0.3 - 1.2 mg/dL   GFR calc non Af Amer >60 >60 mL/min   GFR calc Af Amer >60 >60 mL/min    Comment: (NOTE) The eGFR has been calculated using the CKD EPI equation. This calculation has not been validated in all clinical situations. eGFR's persistently <60 mL/min signify possible Chronic Kidney Disease.    Anion gap 10 5 - 15  Vitamin B12     Status: Abnormal   Collection Time: 07/17/17  6:58 AM  Result Value Ref Range   Vitamin B-12 1,269 (H) 180 - 914 pg/mL    Comment: (NOTE) This assay is not validated for testing neonatal or myeloproliferative syndrome specimens for Vitamin B12 levels.   Sedimentation rate     Status: Abnormal   Collection Time: 07/17/17  6:58 AM  Result Value Ref Range   Sed Rate 82 (H) 0 - 22 mm/hr  TSH     Status: None   Collection Time: 07/17/17  6:58 AM  Result Value Ref  Range   TSH 2.933 0.350 - 4.500 uIU/mL    Comment: Performed by a 3rd Generation assay with a functional sensitivity of <=0.01 uIU/mL.  Glucose, capillary     Status: Abnormal   Collection Time: 07/17/17  7:10 AM  Result Value Ref Range   Glucose-Capillary 101 (H) 65 - 99 mg/dL  Urine rapid drug screen (hosp performed)     Status: None   Collection Time: 07/17/17  7:37 AM  Result Value Ref Range   Opiates NONE DETECTED NONE DETECTED   Cocaine NONE DETECTED NONE DETECTED   Benzodiazepines NONE DETECTED NONE DETECTED   Amphetamines NONE DETECTED NONE DETECTED   Tetrahydrocannabinol NONE DETECTED NONE DETECTED   Barbiturates NONE DETECTED NONE DETECTED    Comment:        DRUG SCREEN FOR MEDICAL PURPOSES ONLY.  IF CONFIRMATION IS NEEDED FOR ANY PURPOSE, NOTIFY LAB WITHIN 5 DAYS.        LOWEST DETECTABLE LIMITS FOR URINE DRUG SCREEN Drug Class       Cutoff (ng/mL) Amphetamine      1000 Barbiturate      200 Benzodiazepine   829 Tricyclics       562 Opiates          300 Cocaine          300 THC              50   Urinalysis, Routine w reflex microscopic     Status: Abnormal   Collection Time: 07/17/17  7:38 AM  Result  Value Ref Range   Color, Urine YELLOW YELLOW   APPearance HAZY (A) CLEAR   Specific Gravity, Urine 1.010 1.005 - 1.030   pH 7.0 5.0 - 8.0   Glucose, UA NEGATIVE NEGATIVE mg/dL   Hgb urine dipstick NEGATIVE NEGATIVE   Bilirubin Urine NEGATIVE NEGATIVE   Ketones, ur 20 (A) NEGATIVE mg/dL   Protein, ur NEGATIVE NEGATIVE mg/dL   Nitrite NEGATIVE NEGATIVE   Leukocytes, UA MODERATE (A) NEGATIVE   RBC / HPF 0-5 0 - 5 RBC/hpf   WBC, UA 6-30 0 - 5 WBC/hpf   Bacteria, UA FEW (A) NONE SEEN   Squamous Epithelial / LPF NONE SEEN NONE SEEN   WBC Clumps PRESENT    Mucus PRESENT   Troponin I (q 6hr x 3)     Status: None   Collection Time: 07/17/17  3:24 PM  Result Value Ref Range   Troponin I <0.03 <0.03 ng/mL  Glucose, capillary     Status: Abnormal   Collection Time:  07/17/17  5:18 PM  Result Value Ref Range   Glucose-Capillary 116 (H) 65 - 99 mg/dL  Glucose, capillary     Status: None   Collection Time: 07/17/17  8:08 PM  Result Value Ref Range   Glucose-Capillary 94 65 - 99 mg/dL   Comment 1 Notify RN    Comment 2 Document in Chart   Basic metabolic panel     Status: Abnormal   Collection Time: 07/18/17  2:28 AM  Result Value Ref Range   Sodium 139 135 - 145 mmol/L   Potassium 3.4 (L) 3.5 - 5.1 mmol/L   Chloride 108 101 - 111 mmol/L   CO2 20 (L) 22 - 32 mmol/L   Glucose, Bld 94 65 - 99 mg/dL   BUN 10 6 - 20 mg/dL   Creatinine, Ser 0.82 0.44 - 1.00 mg/dL   Calcium 8.7 (L) 8.9 - 10.3 mg/dL   GFR calc non Af Amer >60 >60 mL/min   GFR calc Af Amer >60 >60 mL/min    Comment: (NOTE) The eGFR has been calculated using the CKD EPI equation. This calculation has not been validated in all clinical situations. eGFR's persistently <60 mL/min signify possible Chronic Kidney Disease.    Anion gap 11 5 - 15  CBC     Status: Abnormal   Collection Time: 07/18/17  2:28 AM  Result Value Ref Range   WBC 4.4 4.0 - 10.5 K/uL   RBC 3.89 3.87 - 5.11 MIL/uL   Hemoglobin 11.3 (L) 12.0 - 15.0 g/dL   HCT 33.6 (L) 36.0 - 46.0 %   MCV 86.4 78.0 - 100.0 fL   MCH 29.0 26.0 - 34.0 pg   MCHC 33.6 30.0 - 36.0 g/dL   RDW 14.5 11.5 - 15.5 %   Platelets 164 150 - 400 K/uL  Glucose, capillary     Status: None   Collection Time: 07/18/17  6:20 AM  Result Value Ref Range   Glucose-Capillary 81 65 - 99 mg/dL   Comment 1 Notify RN    Comment 2 Document in Chart   Glucose, capillary     Status: None   Collection Time: 07/18/17 11:14 AM  Result Value Ref Range   Glucose-Capillary 90 65 - 99 mg/dL   Dg Chest 2 View  Result Date: 07/16/2017 CLINICAL DATA:  Altered mental status. EXAM: CHEST  2 VIEW COMPARISON:  July 11, 2017 FINDINGS: The heart size and mediastinal contours are within normal limits. Both lungs are clear.  The visualized skeletal structures are  unremarkable. IMPRESSION: No active cardiopulmonary disease. Electronically Signed   By: Dorise Bullion III M.D   On: 07/16/2017 19:38   Ct Head Wo Contrast  Result Date: 07/16/2017 CLINICAL DATA:  Slurred speech with right-sided weakness EXAM: CT HEAD WITHOUT CONTRAST TECHNIQUE: Contiguous axial images were obtained from the base of the skull through the vertex without intravenous contrast. COMPARISON:  None. FINDINGS: Brain: There is an area of decreased attenuation in the left centrum semi ovale consistent with acute to subacute ischemia. Mild basal ganglia calcifications are seen. No other areas of acute ischemia are noted. No findings to suggest acute hemorrhage are seen. Vascular: No hyperdense vessel or unexpected calcification. Skull: Normal. Negative for fracture or focal lesion. Sinuses/Orbits: No acute finding. Other: None. IMPRESSION: Area of acute to subacute ischemia within the centrum semi ovale on the left. No other focal abnormality is noted. Critical Value/emergent results were called by telephone at the time of interpretation on 07/16/2017 at 7:52 pm to Dr. Marda Stalker , who verbally acknowledged these results. Electronically Signed   By: Inez Catalina M.D.   On: 07/16/2017 19:52   Ct Angio Neck W Or Wo Contrast  Result Date: 07/17/2017 CLINICAL DATA:  Acute LEFT MCA territory lenticulostriate infarct. Continued surveillance. EXAM: CT ANGIOGRAPHY NECK TECHNIQUE: Multidetector CT imaging of the neck was performed using the standard protocol during bolus administration of intravenous contrast. Multiplanar CT image reconstructions and MIPs were obtained to evaluate the vascular anatomy. Carotid stenosis measurements (when applicable) are obtained utilizing NASCET criteria, using the distal internal carotid diameter as the denominator. CONTRAST:  Isovue 370, 50 mL. COMPARISON:  MRI brain 07/17/2017.  CT head 07/16/2017. FINDINGS: Aortic arch: Dolichoectasia.  No proximal stenosis.  Right carotid system: No evidence of dissection, stenosis (50% or greater) or occlusion. Minor atheromatous change. Left carotid system: No evidence of dissection, stenosis (50% or greater) or occlusion. Minor atheromatous change. Vertebral arteries: Codominant. No evidence of dissection, stenosis (50% or greater) or occlusion. Skeleton: Spondylosis.  Edentulous. Other neck: No masses. Upper chest: No pneumothorax or nodules. IMPRESSION: Minor carotid bifurcation atherosclerosis. No extracranial flow reducing lesion lesion or dissection. Electronically Signed   By: Staci Righter M.D.   On: 07/17/2017 13:10   Mr Brain Wo Contrast  Result Date: 07/17/2017 CLINICAL DATA:  76 y/o F; slurred speech and right-sided weakness. Abnormal CT of head. EXAM: MRI HEAD WITHOUT CONTRAST MRA HEAD WITHOUT CONTRAST TECHNIQUE: Multiplanar, multiecho pulse sequences of the brain and surrounding structures were obtained without intravenous contrast. Angiographic images of the head were obtained using MRA technique without contrast. COMPARISON:  07/16/2017 CT head FINDINGS: MRI HEAD FINDINGS Brain: Focus of reduced diffusion compatible with acute/ early subacute infarction involving left posterior putamen, caudate body, corona radiata measuring 2.8 x 1.5 x 1.6 cm (volume = 3.5 cm^3)(AP x ML x CC series 3, image 28 and series 5, image 18). The hemorrhage has associated T2 FLAIR hyperintensity. No hemorrhage or significant mass effect. Few nonspecific foci of T2 FLAIR hyperintense signal abnormality in subcortical and periventricular white matter are compatible with mild chronic microvascular ischemic changes for age. Mild brain parenchymal volume loss. No hydrocephalus, extra-axial collection, or effacement of basilar cisterns. Vascular: As below. Skull and upper cervical spine: Normal marrow signal. Sinuses/Orbits: Negative. Other: None. MRA HEAD FINDINGS Anterior circulation: No large vessel occlusion, aneurysm, or significant  stenosis is identified. Posterior circulation: No large vessel occlusion, aneurysm, or significant stenosis is identified. Anatomic variation: Bilateral fetal PCA  and patent anterior communicating artery. IMPRESSION: 1. Acute/early subacute infarction involving left posterior putamen, caudate body, and corona radiata measuring 3.5 cc. No acute hemorrhage or mass effect. 2. Background of mild chronic microvascular ischemic changes and mild parenchymal volume loss of the brain. 3. Patent circle of Willis. No large vessel occlusion, aneurysm, or significant stenosis is identified. These results will be called to the ordering clinician or representative by the Radiologist Assistant, and communication documented in the PACS or zVision Dashboard. Electronically Signed   By: Kristine Garbe M.D.   On: 07/17/2017 02:33   Dg Swallowing Func-speech Pathology  Result Date: 07/17/2017 Please refer to "Notes" tab for Speech Pathology notes.  Mr Virgel Paling DX Contrast  Result Date: 07/17/2017 CLINICAL DATA:  76 y/o F; slurred speech and right-sided weakness. Abnormal CT of head. EXAM: MRI HEAD WITHOUT CONTRAST MRA HEAD WITHOUT CONTRAST TECHNIQUE: Multiplanar, multiecho pulse sequences of the brain and surrounding structures were obtained without intravenous contrast. Angiographic images of the head were obtained using MRA technique without contrast. COMPARISON:  07/16/2017 CT head FINDINGS: MRI HEAD FINDINGS Brain: Focus of reduced diffusion compatible with acute/ early subacute infarction involving left posterior putamen, caudate body, corona radiata measuring 2.8 x 1.5 x 1.6 cm (volume = 3.5 cm^3)(AP x ML x CC series 3, image 28 and series 5, image 18). The hemorrhage has associated T2 FLAIR hyperintensity. No hemorrhage or significant mass effect. Few nonspecific foci of T2 FLAIR hyperintense signal abnormality in subcortical and periventricular white matter are compatible with mild chronic microvascular  ischemic changes for age. Mild brain parenchymal volume loss. No hydrocephalus, extra-axial collection, or effacement of basilar cisterns. Vascular: As below. Skull and upper cervical spine: Normal marrow signal. Sinuses/Orbits: Negative. Other: None. MRA HEAD FINDINGS Anterior circulation: No large vessel occlusion, aneurysm, or significant stenosis is identified. Posterior circulation: No large vessel occlusion, aneurysm, or significant stenosis is identified. Anatomic variation: Bilateral fetal PCA and patent anterior communicating artery. IMPRESSION: 1. Acute/early subacute infarction involving left posterior putamen, caudate body, and corona radiata measuring 3.5 cc. No acute hemorrhage or mass effect. 2. Background of mild chronic microvascular ischemic changes and mild parenchymal volume loss of the brain. 3. Patent circle of Willis. No large vessel occlusion, aneurysm, or significant stenosis is identified. These results will be called to the ordering clinician or representative by the Radiologist Assistant, and communication documented in the PACS or zVision Dashboard. Electronically Signed   By: Kristine Garbe M.D.   On: 07/17/2017 02:33     Medical Problem List and Plan: 1.  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  DVT Prophylaxis/Anticoagulation: SCDs. Monitor for any signs of DVT 3. Pain Management: Voltaren gel as needed 4. Mood: Celexa 40 mg daily 5. Neuropsych: This patient is capable of making decisions on her own behalf. 6. Skin/Wound Care: Routine skin checks 7. Fluids/Electrolytes/Nutrition: Routine I&O with follow-up chemistries 8. Dysphagia. Dysphagia #2 nectar liquids. Follow-up speech therapy 9. Hypertension. Cozaar 100 mg daily, Toprol 25 mg daily 10. Hyperlipidemia. Lipitor 11. Diabetes mellitus with peripheral neuropathy. Hemoglobin A1c 5.5. Patient diet controlled prior to  admission.   Post Admission Physician Evaluation: 1. Preadmission assessment reviewed and changes made below. 2. Functional deficits secondary  to left basal ganglia infarction. 3. Patient is admitted to receive collaborative, interdisciplinary care between the physiatrist, rehab nursing staff, and therapy team. 4. Patient's level of medical complexity and substantial therapy needs in context of that medical necessity  cannot be provided at a lesser intensity of care such as a SNF. 5. Patient has experienced substantial functional loss from his/her baseline which was documented above under the "Functional History" and "Functional Status" headings.  Judging by the patient's diagnosis, physical exam, and functional history, the patient has potential for functional progress which will result in measurable gains while on inpatient rehab.  These gains will be of substantial and practical use upon discharge  in facilitating mobility and self-care at the household level. 6. Physiatrist will provide 24 hour management of medical needs as well as oversight of the therapy plan/treatment and provide guidance as appropriate regarding the interaction of the two. 7. The Preadmission Screening has been reviewed and patient status is unchanged unless otherwise stated above. 8. 24 hour rehab nursing will assist with safety, disease management, medication administration and patient education and help integrate therapy concepts, techniques,education, etc. 9. PT will assess and treat for/with: Lower extremity strength, range of motion, stamina, balance, functional mobility, safety, adaptive techniques and equipment, coping skills, pain control, stroke education.   Goals are: Mod I/Supervision. 10. OT will assess and treat for/with: ADL's, functional mobility, safety, upper extremity strength, adaptive techniques and equipment, ego support, and community reintegration.   Goals are: Mod I/Supervision. Therapy may proceed with  showering this patient. 11. SLP will assess and treat for/with: Swallowing, speech.  Goals are: Mod I. 12. Case Management and Social Worker will assess and treat for psychological issues and discharge planning. 63. Team conference will be held weekly to assess progress toward goals and to determine barriers to discharge. 14. Patient will receive at least 3 hours of therapy per day at least 5 days per week. 15. ELOS: 7-11 day.       16. Prognosis:  good  Delice Lesch, MD, ABPMR Lauraine Rinne J., PA-C 07/18/2017

## 2017-07-18 NOTE — Discharge Summary (Signed)
Physician Discharge Summary  Kristi David ZOX:[id number] DOB: 1940/11/18 DOA: 07/16/2017  PCP: Sigmund Hazel, MD  Admit date: 07/16/2017 Discharge date: 07/18/2017  Time spent: 45 minutes  Recommendations for Outpatient Follow-up:  -Will be discharged to CIR today for post- CVA rehab.   Discharge Diagnoses:  Principal Problem:   Stroke [facility]) Active Problems:   Hypertension   Hyperlipidemia   Facial droop   Right sided weakness   Diabetes mellitus (HCC)   Diastolic dysfunction   Dysphagia, post-stroke   Dysarthria, post-stroke   Hypokalemia   Acute blood loss anemia   Discharge Condition: Stable and improved  Filed Weights   07/17/17 0000  Weight: 88 kg (194 lb 0.1 oz)    History of present illness:  As per Dr. Selena Batten on 10/20:  Kristi David  is a 76 y.o. female, w hypertension, hyperlipidemia, dm2, apparently c/o slurred speech and right sided weakness, starting Thursday. Friend of son apparently called her and thinks her speech was slightly off but didn't realize at the time its significant  Her son called tonight and recognized her slurred speech as a sign of stroke and called EMS.   In ED Wbc 5.1, Hgb 12.3, Plt 202 Bun12, Creatinine 0.94 INR 0.94  CXR no acute process EKG:  nsr at 70, Lad, prolonged QTc, , poor R progression   CT brain=> IMPRESSION: Area of acute to subacute ischemia within the centrum semi ovale on the left. No other focal abnormality is noted.  Pt will be admitted for r/o CVA  Hospital Course:   Acute/Subacute CVA -MRI: acute/early subacute infarction of the left posterior putamen, caudate body and corona radiata. -ASA has been upgraded to plavix for secondary stroke prevention. -ECHO: EF 55-60%, grade 1 diastolic dysfunction, no WMA. Mild MR, mild TR. -Carotid Dopplers: ordered with results pending. -LDL 175; has been started on atorvastatin. -ST initially recommended NPO but after MBS D2 with nectar thick liquids. Diet  requested. -PT/OT are recommending CIR; will go to CIR once bed available. -Neurology has signed off. They are recommending a 30 day event monitor to r/o a fib.  Dysphagia -Post CVA. -D2 nectar thick per ST after MBS.  HTN -Fair control. -Will not overshoot treatment given acute CVA.  DM II -Well controlled. -Continue current regimen.  Hyperlipidemia -On statin.  Procedures:  As above   Consultations:  Neurology  Physical Medicine and Rehab  Discharge Instructions  Discharge Instructions    Ambulatory referral to Neurology    Complete by:  As directed    Follow up with stroke clinic Darrol Angel preferred, if not available, then consider Sylvie Farrier, [facility] or Lucia Gaskins whoever is available) at [facility] in about 6-8 weeks. Thanks.      Allergies  Allergen Reactions  . Oregano [Origanum Oil] Other (See Comments)    Congestion, stuffy nose  . Peanut-Containing Drug Products Other (See Comments)    Congestion, stuffy nose Can eat cooked peanuts if pre-med with benadryl  . Tomato Other (See Comments)    Congestion, stuffy nose   Follow-up Information    Nilda Riggs, NP. Schedule an appointment as soon as possible for a visit in 6 week(s).   Specialty:  Family Medicine Contact information: [address] [place] [address] [phone]            The results of significant diagnostics from this hospitalization (including imaging, microbiology, ancillary and laboratory) are listed below for reference.    Significant Diagnostic Studies: Dg Chest 2  View  Result Date: 07/16/2017 CLINICAL DATA:  Altered mental status. EXAM: CHEST  2 VIEW COMPARISON:  July 11, 2017 FINDINGS: The heart size and mediastinal contours are within normal limits. Both lungs are clear. The visualized skeletal structures are unremarkable. IMPRESSION: No active cardiopulmonary disease. Electronically Signed   By: Gerome Sam III M.D   On: 07/16/2017 19:38     Ct Head Wo Contrast  Result Date: 07/16/2017 CLINICAL DATA:  Slurred speech with right-sided weakness EXAM: CT HEAD WITHOUT CONTRAST TECHNIQUE: Contiguous axial images were obtained from the base of the skull through the vertex without intravenous contrast. COMPARISON:  None. FINDINGS: Brain: There is an area of decreased attenuation in the left centrum semi ovale consistent with acute to subacute ischemia. Mild basal ganglia calcifications are seen. No other areas of acute ischemia are noted. No findings to suggest acute hemorrhage are seen. Vascular: No hyperdense vessel or unexpected calcification. Skull: Normal. Negative for fracture or focal lesion. Sinuses/Orbits: No acute finding. Other: None. IMPRESSION: Area of acute to subacute ischemia within the centrum semi ovale on the left. No other focal abnormality is noted. Critical Value/emergent results were called by telephone at the time of interpretation on 07/16/2017 at 7:52 pm to Dr. Lynden Oxford , who verbally acknowledged these results. Electronically Signed   By: Alcide Clever M.D.   On: 07/16/2017 19:52   Ct Angio Neck W Or Wo Contrast  Result Date: 07/17/2017 CLINICAL DATA:  Acute LEFT MCA territory lenticulostriate infarct. Continued surveillance. EXAM: CT ANGIOGRAPHY NECK TECHNIQUE: Multidetector CT imaging of the neck was performed using the standard protocol during bolus administration of intravenous contrast. Multiplanar CT image reconstructions and MIPs were obtained to evaluate the vascular anatomy. Carotid stenosis measurements (when applicable) are obtained utilizing NASCET criteria, using the distal internal carotid diameter as the denominator. CONTRAST:  Isovue 370, 50 mL. COMPARISON:  MRI brain 07/17/2017.  CT head 07/16/2017. FINDINGS: Aortic arch: Dolichoectasia.  No proximal stenosis. Right carotid system: No evidence of dissection, stenosis (50% or greater) or occlusion. Minor atheromatous change. Left carotid system:  No evidence of dissection, stenosis (50% or greater) or occlusion. Minor atheromatous change. Vertebral arteries: Codominant. No evidence of dissection, stenosis (50% or greater) or occlusion. Skeleton: Spondylosis.  Edentulous. Other neck: No masses. Upper chest: No pneumothorax or nodules. IMPRESSION: Minor carotid bifurcation atherosclerosis. No extracranial flow reducing lesion lesion or dissection. Electronically Signed   By: Elsie Stain M.D.   On: 07/17/2017 13:10   Ct Angio Chest Pe W Or Wo Contrast  Result Date: 07/12/2017 CLINICAL DATA:  Shortness of breath EXAM: CT ANGIOGRAPHY CHEST WITH CONTRAST TECHNIQUE: Multidetector CT imaging of the chest was performed using the standard protocol during bolus administration of intravenous contrast. Multiplanar CT image reconstructions and MIPs were obtained to evaluate the vascular anatomy. CONTRAST:  75 mL Isovue 370 nonionic COMPARISON:  None. FINDINGS: Cardiovascular: There is no demonstrable pulmonary embolus. No thoracic aortic aneurysm or dissection. Visualized great vessels appear unremarkable. There are foci of atherosclerotic calcification in the aorta. There are occasional foci of coronary artery calcification. Pericardium is not appreciably thickened. Mediastinum/Nodes: Thyroid appears unremarkable. There are scattered subcentimeter lymph nodes. There is no evident adenopathy by size criteria. No esophageal lesions are appreciable. Lungs/Pleura: There is patchy bibasilar atelectatic change. There is no appreciable edema or consolidation. No pleural effusion or pleural thickening evident. Upper Abdomen: There is a calcified cystic area in the spleen measuring 4.1 x 3.7 cm. A nearby a second calcified cystic area in  the spleen measures 3.6 x 2.7 cm. There is a 1.3 x 1.0 cm noncalcified cystic area in the spleen as well. There is atherosclerotic calcification in the upper abdominal aorta and proximal mesenteric arteries. Visualized upper abdominal  structures otherwise appear unremarkable. Musculoskeletal: No blastic or lytic bone lesions are evident. There is degenerative change in each shoulder. Review of the MIP images confirms the above findings. IMPRESSION: 1.  No demonstrable pulmonary embolus. 2. Areas of lower lobe atelectatic change bilaterally. No lung edema or consolidation. 3. No adenopathy by size criteria. Subcentimeter mediastinal lymph nodes noted, considered nonspecific. 4. Foci of aortic atherosclerosis. Calcification noted in the proximal major abdominal mesenteric arterial vessels. Foci of coronary artery calcification noted. 5. Calcified cystic areas in the spleen, likely representing previous liquified hematomas. Aortic Atherosclerosis (ICD10-I70.0). Electronically Signed   By: Bretta Bang III M.D.   On: 07/12/2017 14:38   Mr Brain Wo Contrast  Result Date: 07/17/2017 CLINICAL DATA:  76 y/o F; slurred speech and right-sided weakness. Abnormal CT of head. EXAM: MRI HEAD WITHOUT CONTRAST MRA HEAD WITHOUT CONTRAST TECHNIQUE: Multiplanar, multiecho pulse sequences of the brain and surrounding structures were obtained without intravenous contrast. Angiographic images of the head were obtained using MRA technique without contrast. COMPARISON:  07/16/2017 CT head FINDINGS: MRI HEAD FINDINGS Brain: Focus of reduced diffusion compatible with acute/ early subacute infarction involving left posterior putamen, caudate body, corona radiata measuring 2.8 x 1.5 x 1.6 cm (volume = 3.5 cm^3)(AP x ML x CC series 3, image 28 and series 5, image 18). The hemorrhage has associated T2 FLAIR hyperintensity. No hemorrhage or significant mass effect. Few nonspecific foci of T2 FLAIR hyperintense signal abnormality in subcortical and periventricular white matter are compatible with mild chronic microvascular ischemic changes for age. Mild brain parenchymal volume loss. No hydrocephalus, extra-axial collection, or effacement of basilar cisterns.  Vascular: As below. Skull and upper cervical spine: Normal marrow signal. Sinuses/Orbits: Negative. Other: None. MRA HEAD FINDINGS Anterior circulation: No large vessel occlusion, aneurysm, or significant stenosis is identified. Posterior circulation: No large vessel occlusion, aneurysm, or significant stenosis is identified. Anatomic variation: Bilateral fetal PCA and patent anterior communicating artery. IMPRESSION: 1. Acute/early subacute infarction involving left posterior putamen, caudate body, and corona radiata measuring 3.5 cc. No acute hemorrhage or mass effect. 2. Background of mild chronic microvascular ischemic changes and mild parenchymal volume loss of the brain. 3. Patent circle of Willis. No large vessel occlusion, aneurysm, or significant stenosis is identified. These results will be called to the ordering clinician or representative by the Radiologist Assistant, and communication documented in the PACS or zVision Dashboard. Electronically Signed   By: Mitzi Hansen M.D.   On: 07/17/2017 02:33   Dg Swallowing Func-speech Pathology  Result Date: 07/17/2017 Please refer to "Notes" tab for Speech Pathology notes.  Mr Shirlee Latch GN Contrast  Result Date: 07/17/2017 CLINICAL DATA:  76 y/o F; slurred speech and right-sided weakness. Abnormal CT of head. EXAM: MRI HEAD WITHOUT CONTRAST MRA HEAD WITHOUT CONTRAST TECHNIQUE: Multiplanar, multiecho pulse sequences of the brain and surrounding structures were obtained without intravenous contrast. Angiographic images of the head were obtained using MRA technique without contrast. COMPARISON:  07/16/2017 CT head FINDINGS: MRI HEAD FINDINGS Brain: Focus of reduced diffusion compatible with acute/ early subacute infarction involving left posterior putamen, caudate body, corona radiata measuring 2.8 x 1.5 x 1.6 cm (volume = 3.5 cm^3)(AP x ML x CC series 3, image 28 and series 5, image 18). The hemorrhage has associated  T2 FLAIR hyperintensity. No  hemorrhage or significant mass effect. Few nonspecific foci of T2 FLAIR hyperintense signal abnormality in subcortical and periventricular white matter are compatible with mild chronic microvascular ischemic changes for age. Mild brain parenchymal volume loss. No hydrocephalus, extra-axial collection, or effacement of basilar cisterns. Vascular: As below. Skull and upper cervical spine: Normal marrow signal. Sinuses/Orbits: Negative. Other: None. MRA HEAD FINDINGS Anterior circulation: No large vessel occlusion, aneurysm, or significant stenosis is identified. Posterior circulation: No large vessel occlusion, aneurysm, or significant stenosis is identified. Anatomic variation: Bilateral fetal PCA and patent anterior communicating artery. IMPRESSION: 1. Acute/early subacute infarction involving left posterior putamen, caudate body, and corona radiata measuring 3.5 cc. No acute hemorrhage or mass effect. 2. Background of mild chronic microvascular ischemic changes and mild parenchymal volume loss of the brain. 3. Patent circle of Willis. No large vessel occlusion, aneurysm, or significant stenosis is identified. These results will be called to the ordering clinician or representative by the Radiologist Assistant, and communication documented in the PACS or zVision Dashboard. Electronically Signed   By: Mitzi HansenLance  Furusawa-Stratton M.D.   On: 07/17/2017 02:33    Microbiology: No results found for this or any previous visit (from the past 240 hour(s)).   Labs: Basic Metabolic Panel:  Recent Labs Lab 07/16/17 1845 07/16/17 1859 07/17/17 0658 07/18/17 0228  NA 136 142 141 139  K 3.9 4.0 3.3* 3.4*  CL 104 108 110 108  CO2 23  --  21* 20*  GLUCOSE 97 98 91 94  BUN 12 12 10 10   CREATININE 0.94 0.90 0.87 0.82  CALCIUM 9.0  --  8.7* 8.7*   Liver Function Tests:  Recent Labs Lab 07/16/17 1845 07/17/17 0658  AST 23 21  ALT 15 14  ALKPHOS 75 71  BILITOT 1.0 1.2  PROT 7.2 6.8  ALBUMIN 3.5 3.2*   No  results for input(s): LIPASE, AMYLASE in the last 168 hours. No results for input(s): AMMONIA in the last 168 hours. CBC:  Recent Labs Lab 07/16/17 1845 07/16/17 1859 07/17/17 0658 07/18/17 0228  WBC 5.1  --  5.3 4.4  NEUTROABS 2.2  --   --   --   HGB 12.3 12.2 11.5* 11.3*  HCT 36.7 36.0 34.8* 33.6*  MCV 85.2  --  84.5 86.4  PLT 202  --  200 164   Cardiac Enzymes:  Recent Labs Lab 07/16/17 2359 07/17/17 0658 07/17/17 1524  TROPONINI <0.03 <0.03 <0.03   BNP: BNP (last 3 results) No results for input(s): BNP in the last 8760 hours.  ProBNP (last 3 results) No results for input(s): PROBNP in the last 8760 hours.  CBG:  Recent Labs Lab 07/17/17 0710 07/17/17 1718 07/17/17 2008 07/18/17 0620 07/18/17 1114  GLUCAP 101* 116* 94 81 90       Signed:  HERNANDEZ ACOSTA,ESTELA  [facility] Pager: [phone] 07/18/2017, 1:58 PM

## 2017-07-18 NOTE — Progress Notes (Signed)
Pt removed off of tele and escorted to 4W13 via wheelchair. Pt belongings with family. Pt is doing well and stable.

## 2017-07-18 NOTE — PMR Pre-admission (Signed)
PMR Admission Coordinator Pre-Admission Assessment  Patient: Kristi David is an 76 y.o., female MRN: [id number] DOB: 1940/09/29 Height: 5\' 7"  (170.2 cm) Weight: 88 kg (194 lb 0.1 oz)              Insurance Information HMO: No   PPO:       PCP:       IPA:       80/20:       OTHER:   PRIMARY:  Medicare A/B      Policy#: [id number]      Subscriber: [name] CM Name:        Phone#:       Fax#:   Pre-Cert#:        Employer: Retired Benefits:  Phone #:       Name: Checked in Engineer, [employer] Eff. Date: 09/28/03     Deduct: $1340      Out of Pocket Max: None      Life Max: N/A CIR: 100%      SNF: 100 days Outpatient: 80%     Co-Pay: 20% Home Health: 100%      Co-Pay: none DME: 80%     Co-Pay: 20% Providers: patient's choice  SECONDARY: [employer]      Policy#: [id number]      Subscriber:  [name] CM Name:        Phone#:       Fax#:   Pre-Cert#:        Employer: Retired Benefits:  Phone #: [phone]     Name:   Eff. Date:       Deduct:        Out of Pocket Max:        Life Max:   CIR:        SNF:   Outpatient:       Co-Pay:   Home Health:        Co-Pay:   DME:       Co-Pay:    Emergency Contact Information Contact Information    Name Relation Home Work Mobile   [place] Son   [phone]     Current Medical History  Patient Admitting Diagnosis:  L BG infarct  History of Present Illness:  A 76 y.o. right handed female with history of hypertension, hyperlipidemia, diabetes mellitus. Per chart review, patient, and son, patient lives alone. Independent prior to admission but does not drive. One level apartment with elevator. Presented 07/16/2017 with right sided weakness and slurred speech. Cranial CT scan showed areas of acute to subacute ischemia within the centrum semiovale ovale on the left. No other focal abnormality noted. Patient did not receive TPA. MRI reviewed, showing left basal ganglia infarct. Per report, an acute early subacute infarction left posterior  putamen, caudate body and corona radiata. MRA with no large vessel occlusion aneurysm or stenosis. CT angiogram of neck no extracranial flow reducing lesion or dissection identified. Lower extremity Dopplers negative for DVT. Echocardiogram with ejection fraction of 60% grade 1 diastolic dysfunction. Maintained on Plavix for CVA prophylaxis. Dysphagia #2 nectar thick liquid diet. Patient seen working with physical therapy with balance deficits and difficulties with motor planning. Physical and occupational therapy evaluations completed with recommendations of physical medicine rehabilitation consult.   Total: 7=NIH  Past Medical History  Past Medical History:  Diagnosis Date  . Anemia   . Arthritis    "legs, knees" (01/07/2016)  . GERD (gastroesophageal reflux disease)    no meds  . High cholesterol   .  History of blood transfusion   . Hypertension   . Seasonal allergies   . Shortness of breath dyspnea    if too active  . Syncope    passed out and was hospitalized for a couple of days  . Type II diabetes mellitus (HCC)    does not take medicine diet controlled    Family History  family history includes CAD in her father; Stroke in her mother.  [facility]ospital[facility] after knee surgery in 2017.  Has the patient had major surgery during 100 days prior to admission? No  Current Medications   Current Facility-Administered Medications:  .   stroke: mapping our early stages of recovery book, , Does not apply, Once, Pearson GrippeKim, James, MD .  acetaminophen (TYLENOL) tablet 650 mg, 650 mg, Oral, Q4H PRN, 650 mg at 07/17/17 2039 **OR** acetaminophen (TYLENOL) solution 650 mg, 650 mg, Per Tube, Q4H PRN **OR** acetaminophen (TYLENOL) suppository 650 mg, 650 mg, Rectal, Q4H PRN, Pearson GrippeKim, James, MD .  atorvastatin (LIPITOR) tablet 80 mg, 80 mg, Oral, q1800, Pearson GrippeKim, James, MD, 80 mg at 07/17/17 1802 .  clopidogrel (PLAVIX) tablet 75 mg, 75 mg, Oral, Daily, Marvel PlanXu, Jindong, MD,  75 mg at 07/18/17 1022 .  diclofenac sodium (VOLTAREN) 1 % transdermal gel 2 g, 2 g, Topical, QID PRN, Pearson GrippeKim, James, MD .  insulin aspart (novoLOG) injection 0-9 Units, 0-9 Units, Subcutaneous, TID WC, Pearson GrippeKim, James, MD .  losartan (COZAAR) tablet 100 mg, 100 mg, Oral, Daily, Philip AspenHernandez Acosta, Limmie PatriciaEstela Y, MD, 100 mg at 07/18/17 1023 .  metoprolol succinate (TOPROL-XL) 24 hr tablet 25 mg, 25 mg, Oral, Daily, Philip AspenHernandez Acosta, Limmie PatriciaEstela Y, MD, 25 mg at 07/18/17 1022  Patients Current Diet: DIET DYS 2 Room service appropriate? Yes; Fluid consistency: Nectar Thick  Precautions / Restrictions Precautions Precautions: Fall Restrictions Weight Bearing Restrictions: No   Has the patient had 2 or more falls or a fall with injury in the past year?No  Prior Activity Level Limited Community (1-2x/wk): Went out about 3 X a week, does not drive.  Home Assistive Devices / Equipment Home Assistive Devices/Equipment: Jeannette Howyeglasses, Walker (specify type) Home Equipment: Shower seat, Bedside commode, Grab bars - tub/shower  Prior Device Use: Indicate devices/aids used by the patient prior to current illness, exacerbation or injury? None  Prior Functional Level Prior Function Level of Independence: Independent Comments: Pt does not drive, but does own grocery shopping, including carrying groceries to her apt   Self Care: Did the patient need help bathing, dressing, using the toilet or eating?  Independent  Indoor Mobility: Did the patient need assistance with walking from room to room (with or without device)? Independent  Stairs: Did the patient need assistance with internal or external stairs (with or without device)? Independent  Functional Cognition: Did the patient need help planning regular tasks such as shopping or remembering to take medications? Independent  Current Functional Level Cognition  Arousal/Alertness: Awake/alert Overall Cognitive Status: Impaired/Different from baseline Current  Attention Level: Selective Orientation Level: Oriented X4 Following Commands: Follows one step commands consistently, Follows multi-step commands inconsistently Safety/Judgement: Decreased awareness of safety, Decreased awareness of deficits General Comments: Pt initially states she doesn't know why she is in hospital, but later states, "they said I had a stroke"  Attention: Sustained Sustained Attention: Appears intact Memory: Appears intact Awareness: Impaired Awareness Impairment: Emergent impairment Problem Solving: Impaired Problem Solving Impairment: Functional complex Executive Function: Self Correcting, Self Monitoring, Reasoning Reasoning: Impaired Reasoning Impairment: Functional basic Self Monitoring:  Impaired Self Monitoring Impairment: Functional basic Self Correcting: Impaired Self Correcting Impairment: Functional complex Safety/Judgment: Impaired    Extremity Assessment (includes Sensation/Coordination)  Upper Extremity Assessment: RUE deficits/detail RUE Deficits / Details: Pt with Rt UE movment in [place] stage 3, beginning stage 4, and hand stage 5 (unable to fully flex digits).  She uses Rt UE as a gross assist during ADL activities   Lower Extremity Assessment: Defer to PT evaluation RLE Deficits / Details: strength 4-/5. sensation intact to LT  LLE Deficits / Details: sensation intact to LT; strength 4-/5    ADLs  Overall ADL's : Needs assistance/impaired Eating/Feeding: NPO Grooming: Wash/dry hands, Wash/dry face, Oral care, Minimal assistance, Standing Upper Body Bathing: Minimal assistance, Sitting Lower Body Bathing: Moderate assistance, Sit to/from stand Upper Body Dressing : Moderate assistance, Sitting Lower Body Dressing: Moderate assistance, Sit to/from stand Lower Body Dressing Details (indicate cue type and reason): pt requires assist to pull socks over feet, due to Rt UE weakness.  She does attempt to use Rt UE to assist  Toilet Transfer:  Minimal assistance, Ambulation, Comfort height toilet, Grab bars Toileting- Clothing Manipulation and Hygiene: Moderate assistance, Sit to/from stand Toileting - Clothing Manipulation Details (indicate cue type and reason): assist to pull pants over Rt hip  Functional mobility during ADLs: Minimal assistance    Mobility  Overal bed mobility: Needs Assistance Bed Mobility: Sit to Supine, Supine to Sit Supine to sit: Min assist Sit to supine: Min guard General bed mobility comments: Min A for trunk elevation and for RLE lift assist for return to supine.     Transfers  Overall transfer level: Needs assistance Equipment used: Rolling walker (2 wheeled) Transfers: Sit to/from Stand Sit to Stand: Min guard General transfer comment: Min guard for steadying assist. Verbal cues for safe hand placement. Increased time and effort required to stand.     Ambulation / Gait / Stairs / Wheelchair Mobility  Ambulation/Gait Ambulation/Gait assistance: Min assist, Hydrographic surveyor (Feet): 25 Feet Assistive device: Rolling walker (2 wheeled) Gait Pattern/deviations: Step-to pattern, Decreased stride length, Decreased dorsiflexion - right, Drifts right/left General Gait Details: Gait within the room this session. Worked on visual scanning to the R during ambulation to increase awareness to R side, however, pt continues to run into obstacles on the R. Required cues for R step height and foot clearance secondary to decreased DF. During gait, IR MD entered room and pt's food delivered, so ambulation distance shortened this session.  Gait velocity: Decreased Gait velocity interpretation: Below normal speed for age/gender    Posture / Balance Dynamic Sitting Balance Sitting balance - Comments: able to don/doff socks without LOB  Balance Overall balance assessment: Needs assistance Sitting-balance support: Feet supported, No upper extremity supported Sitting balance-Leahy Scale: Good Sitting  balance - Comments: able to don/doff socks without LOB  Standing balance support: During functional activity, No upper extremity supported Standing balance-Leahy Scale: Poor Standing balance comment: Reliant on UE support for balance     Special needs/care consideration BiPAP/CPAP No CPM No Continuous Drip IV No Dialysis No      Life Vest No Oxygen No Special Bed No Trach Size No Wound Vac (area) No   Skin No                           Bowel mgmt: Last BM 07/17/17 Bladder mgmt: Voiding in bathroom with assist Diabetic mgmt Yes, diet to control DM    Previous  Home Environment Living Arrangements: Alone  Lives With: Alone Available Help at Discharge: Family, Available PRN/intermittently Type of Home: Independent living facility Care Facility Name:  (independent living) Home Layout: One level Home Access: Engineer, maintenance (IT) Shower/Tub: Health visitor: Standard Home Care Services: No  Discharge Living Setting Plans for Discharge Living Setting: Apartment, Other (Comment) ([place], has Engineer, structural.) Type of Home at Discharge: Apartment Discharge Home Layout: One level (On the second level, has elevator.) Discharge Home Access: Level entry Does the patient have any problems obtaining your medications?: No  Social/Family/Support Systems Patient Roles: Parent (Has 6 children.) Contact Information: [place] Tremain - son - [phone] Anticipated Caregiver: Self and son Ability/Limitations of Caregiver: One son local, 2 sons in [place], Dtr in [place], Dtr in [place], one son Engineer, site Caregiver Availability: Intermittent (Son can stop in to check on patient.) Discharge Plan Discussed with Primary Caregiver: Yes Is Caregiver In Agreement with Plan?: Yes Does Caregiver/Family have Issues with Lodging/Transportation while Pt is in Rehab?: No  Goals/Additional Needs Patient/Family Goal for Rehab: PT/OT mod I and supervision, SLP mod I  goals Expected length of stay: 7-11 days Cultural Considerations: Attends [employer] Dietary Needs: Dys 2, nectar thick liquids Equipment Needs: TBD Pt/Family Agrees to Admission and willing to participate: Yes Program Orientation Provided & Reviewed with Pt/Caregiver Including Roles  & Responsibilities: Yes  Decrease burden of Care through IP rehab admission: N/A  Possible need for SNF placement upon discharge: Not planned  Patient Condition: This patient's condition remains as documented in the consult dated 07/18/17, in which the Rehabilitation Physician determined and documented that the patient's condition is appropriate for intensive rehabilitative care in an inpatient rehabilitation facility. Will admit to inpatient rehab today.  Preadmission Screen Completed By:  Trish Mage, 07/18/2017 3:17 PM ______________________________________________________________________   Discussed status with Dr. Allena Katz on 07/18/17 at 1517 and received telephone approval for admission today.  Admission Coordinator:  Trish Mage, time 1517/Date 07/18/17

## 2017-07-18 NOTE — Consult Note (Signed)
Physical Medicine and Rehabilitation Consult Reason for Consult: Right side weakness and slurred speech Referring Physician: [facility]   HPI: Kristi David is a 76 y.o. right handed female with history of hypertension, hyperlipidemia, diabetes mellitus. Per chart review, patient, and son, patient lives alone. Independent prior to admission but does not drive. One level apartment with elevator. Presented 07/16/2017 with right sided weakness and slurred speech. Cranial CT scan showed areas of acute to subacute ischemia within the centrum semiovale ovale on the left. No other focal abnormality noted. Patient did not receive TPA. MRI reviewed, showing left basal ganglia infarct. Per report, an acute early subacute infarction left posterior putamen, caudate body and corona radiata. MRA with no large vessel occlusion aneurysm or stenosis. CT angiogram of neck no extracranial flow reducing lesion or dissection identified. Lower extremity Dopplers negative for DVT. Echocardiogram with ejection fraction of 60% grade 1 diastolic dysfunction. Maintained on Plavix for CVA prophylaxis. Dysphagia #2 nectar thick liquid diet. Patient seen working with physical therapy with balance deficits and difficulties with motor planning. Physical and occupational therapy evaluations completed with recommendations of physical medicine rehabilitation consult.   Review of Systems  Constitutional: Negative for chills and fever.  HENT: Negative for hearing loss.   Eyes: Negative for blurred vision and double vision.  Respiratory: Negative for cough.        Some shortness of breath with exertion  Cardiovascular: Negative for chest pain and palpitations.  Gastrointestinal: Positive for constipation. Negative for nausea and vomiting.       GERD  Genitourinary: Negative for dysuria, flank pain and hematuria.  Musculoskeletal: Positive for joint pain and myalgias.  Skin: Negative for rash.  Neurological: Positive for  dizziness, speech change and focal weakness. Negative for seizures.  All other systems reviewed and are negative.  Past Medical History:  Diagnosis Date  . Anemia   . Arthritis    "legs, knees" (01/07/2016)  . GERD (gastroesophageal reflux disease)    no meds  . High cholesterol   . History of blood transfusion   . Hypertension   . Seasonal allergies   . Shortness of breath dyspnea    if too active  . Syncope    passed out and was hospitalized for a couple of days  . Type II diabetes mellitus (HCC)    does not take medicine diet controlled   Past Surgical History:  Procedure Laterality Date  . BUNIONECTOMY Bilateral   . CATARACT EXTRACTION W/ INTRAOCULAR LENS  IMPLANT, BILATERAL Bilateral   . INGUINAL HERNIA REPAIR Right   . KNEE ARTHROSCOPY WITH EXCISION BAKER'S CYST Right   . MR LOWER LEG LEFT ([facility] HX) Left    after a break  . PARTIAL KNEE ARTHROPLASTY Right 01/06/2016   Procedure: RIGHT UNICOMPARTMENTAL KNEE ARTHROPLASTY;  Surgeon: Kathryne Hitch, MD;  [facility]: [facility] OR;  Service: Orthopedics;  Laterality: Right;  . VEIN LIGATION AND STRIPPING Bilateral    Family History  Problem Relation Age of Onset  . Stroke Mother   . CAD Father    Social History:  reports that she has never smoked. She has never used smokeless tobacco. She reports that she does not drink alcohol or use drugs. Allergies:  Allergies  Allergen Reactions  . Oregano [Origanum Oil] Other (See Comments)    Congestion, stuffy nose  . Peanut-Containing Drug Products Other (See Comments)    Congestion, stuffy nose Can eat cooked peanuts if pre-med with benadryl  . Tomato Other (See Comments)  Congestion, stuffy nose   Medications Prior to Admission  Medication Sig Dispense Refill  . aspirin EC 325 MG EC tablet Take 1 tablet (325 mg total) by mouth 2 (two) times daily after a meal. (Patient taking differently: Take 325 mg by mouth 2 (two) times daily as needed for pain. ) 30 tablet 0  .  citalopram (CELEXA) 40 MG tablet Take 40 mg by mouth daily.  0  . diclofenac sodium (VOLTAREN) 1 % GEL Apply 2 g topically 4 (four) times daily. (Patient taking differently: Apply 1 application topically 4 (four) times daily as needed (pain). ) 3 Tube 3  . HORIZANT 600 MG TBCR take 1 tablet by mouth daily (Patient taking differently: take 1 tablet (600 mg) by mouth daily at bedtime) 60 tablet 3  . hyoscyamine (LEVSIN, ANASPAZ) 0.125 MG tablet Take 0.125 mg by mouth every 4 (four) hours as needed (abdominal pain).   0  . losartan (COZAAR) 100 MG tablet Take 100 mg by mouth daily.  0  . metoprolol succinate (TOPROL-XL) 25 MG 24 hr tablet Take 25 mg by mouth daily.    . solifenacin (VESICARE) 10 MG tablet Take 10 mg by mouth daily as needed (frequent urination).     Marland Kitchen. HORIZANT 600 MG TBCR take 1 tablet by mouth daily (Patient not taking: Reported on 07/16/2017) 60 tablet 3  . methocarbamol (ROBAXIN) 500 MG tablet TAKE 1 TABLET BY MOUTH EVERY 6 TO 8 HOURS IF NEEDED FOR SPASMS OR PAIN (Patient not taking: Reported on 07/16/2017) 60 tablet 0  . oxyCODONE (OXY IR/ROXICODONE) 5 MG immediate release tablet Take 1-2 tablets (5-10 mg total) by mouth every 4 (four) hours as needed for severe pain or breakthrough pain. (Patient not taking: Reported on 07/16/2017) 60 tablet 0    Home: Home Living Family/patient expects to be discharged to:: Private residence Living Arrangements: Alone Available Help at Discharge: Family, Available PRN/intermittently Type of Home: Independent living facility (apartment) Home Access: Elevator Home Layout: One level Bathroom Shower/Tub: Health visitorWalk-in shower Bathroom Toilet: Standard Home Equipment: Information systems managerhower seat, Bedside commode, Grab bars - tub/shower  Functional History: Prior Function Level of Independence: Independent Comments: Pt does not drive, but does own grocery shopping, including carrying groceries to her apt  Functional Status:  Mobility: Bed Mobility Overal bed  mobility: Needs Assistance Bed Mobility: Sit to Supine Sit to supine: Min assist General bed mobility comments: assist for Rt LE  Transfers Overall transfer level: Needs assistance Equipment used: Rolling walker (2 wheeled) Transfers: Sit to/from Stand Sit to Stand: Min guard General transfer comment: min guard to steady  Ambulation/Gait Ambulation/Gait assistance: Min guard Ambulation Distance (Feet): 80 Feet Assistive device: Rolling walker (2 wheeled) (briefly used initially) Gait Pattern/deviations: Step-to pattern, Decreased stride length, Decreased dorsiflexion - right, Drifts right/left General Gait Details: ambulation around the room with increased difficulty. Patient running into doors, counter, needing cues for attention to R side. Pt demonstrates instability with walking needing min guard assistance for stability. R LE weakness evident and demonstrates decreased R DF and decreased ability to advance R LE forward properly. unsteady cadence and gait speed changes noted with head turns and obstacles in the hallway Gait velocity: decreased Gait velocity interpretation: Below normal speed for age/gender    ADL: ADL Overall ADL's : Needs assistance/impaired Eating/Feeding: NPO Grooming: Wash/dry hands, Wash/dry face, Oral care, Minimal assistance, Standing Upper Body Bathing: Minimal assistance, Sitting Lower Body Bathing: Moderate assistance, Sit to/from stand Upper Body Dressing : Moderate assistance, Sitting Lower Body Dressing:  Moderate assistance, Sit to/from stand Lower Body Dressing Details (indicate cue type and reason): pt requires assist to pull socks over feet, due to Rt UE weakness.  She does attempt to use Rt UE to assist  Toilet Transfer: Minimal assistance, Ambulation, Comfort height toilet, Grab bars Toileting- Clothing Manipulation and Hygiene: Moderate assistance, Sit to/from stand Toileting - Clothing Manipulation Details (indicate cue type and reason): assist  to pull pants over Rt hip  Functional mobility during ADLs: Minimal assistance  Cognition: Cognition Overall Cognitive Status: Impaired/Different from baseline Orientation Level: Oriented X4 Cognition Arousal/Alertness: Awake/alert Behavior During Therapy: WFL for tasks assessed/performed Overall Cognitive Status: Impaired/Different from baseline Area of Impairment: Following commands, Safety/judgement, Awareness, Problem solving, Attention Current Attention Level: Selective (with min cues ) Following Commands: Follows one step commands consistently, Follows multi-step commands inconsistently Safety/Judgement: Decreased awareness of safety, Decreased awareness of deficits Awareness: Intellectual Problem Solving: Slow processing, Requires verbal cues, Requires tactile cues, Difficulty sequencing General Comments: Pt initially states she doesn't know why she is in hospital, but later states, "they said I had a stroke"   Blood pressure (!) 165/65, pulse 67, temperature 98.6 F (37 C), temperature source Oral, resp. rate 18, height 5\' 7"  (1.702 m), weight 88 kg (194 lb 0.1 oz), SpO2 100 %. Physical Exam  Vitals reviewed. Constitutional: She appears well-developed and well-nourished.  HENT:  Head: Normocephalic and atraumatic.  Eyes: EOM are normal. Right eye exhibits no discharge. Left eye exhibits no discharge.  Pupils reactive to light  Neck: Normal range of motion. Neck supple. No thyromegaly present.  Cardiovascular: Normal rate, regular rhythm and normal heart sounds.   Respiratory: Effort normal and breath sounds normal. No respiratory distress.  GI: Soft. Bowel sounds are normal. She exhibits no distension.  Musculoskeletal: She exhibits no edema or tenderness.  Neurological: She is alert.  Makes eye contact with examiner.  Follows simple commands.  She needed some cues for her appropriate age and date of birth as well as the present year. Motor: LUE/LLE: 5/5 proximal to  distal RUE: Shoulder abduction 2+/5 proximal to distal, elbow flex/ext 2/5, distally 2-/5 RLE: 4-/5 proximal to distal Sensation intact to light touch +Dysarthria  Skin: Skin is warm and dry.  Psychiatric: She has a normal mood and affect. Her behavior is normal.    Results for orders placed or performed during the hospital encounter of 07/16/17 (from the past 24 hour(s))  Troponin I (q 6hr x 3)     Status: None   Collection Time: 07/17/17  6:58 AM  Result Value Ref Range   Troponin I <0.03 <0.03 ng/mL  Hemoglobin A1c     Status: None   Collection Time: 07/17/17  6:58 AM  Result Value Ref Range   Hgb A1c MFr Bld 5.5 4.8 - 5.6 %   Mean Plasma Glucose 111.15 mg/dL  Lipid panel     Status: Abnormal   Collection Time: 07/17/17  6:58 AM  Result Value Ref Range   Cholesterol 250 (H) 0 - 200 mg/dL   Triglycerides 85 <161 mg/dL   HDL 58 >09 mg/dL   Total CHOL/HDL Ratio 4.3 RATIO   VLDL 17 0 - 40 mg/dL   LDL Cholesterol 604 (H) 0 - 99 mg/dL  CBC     Status: Abnormal   Collection Time: 07/17/17  6:58 AM  Result Value Ref Range   WBC 5.3 4.0 - 10.5 K/uL   RBC 4.12 3.87 - 5.11 MIL/uL   Hemoglobin 11.5 (L) 12.0 - 15.0  g/dL   HCT 16.1 (L) 09.6 - 04.5 %   MCV 84.5 78.0 - 100.0 fL   MCH 27.9 26.0 - 34.0 pg   MCHC 33.0 30.0 - 36.0 g/dL   RDW 40.9 81.1 - 91.4 %   Platelets 200 150 - 400 K/uL  Comprehensive metabolic panel     Status: Abnormal   Collection Time: 07/17/17  6:58 AM  Result Value Ref Range   Sodium 141 135 - 145 mmol/L   Potassium 3.3 (L) 3.5 - 5.1 mmol/L   Chloride 110 101 - 111 mmol/L   CO2 21 (L) 22 - 32 mmol/L   Glucose, Bld 91 65 - 99 mg/dL   BUN 10 6 - 20 mg/dL   Creatinine, Ser 7.82 0.44 - 1.00 mg/dL   Calcium 8.7 (L) 8.9 - 10.3 mg/dL   Total Protein 6.8 6.5 - 8.1 g/dL   Albumin 3.2 (L) 3.5 - 5.0 g/dL   AST 21 15 - 41 U/L   ALT 14 14 - 54 U/L   Alkaline Phosphatase 71 38 - 126 U/L   Total Bilirubin 1.2 0.3 - 1.2 mg/dL   GFR calc non Af Amer >60 >60 mL/min    GFR calc Af Amer >60 >60 mL/min   Anion gap 10 5 - 15  Vitamin B12     Status: Abnormal   Collection Time: 07/17/17  6:58 AM  Result Value Ref Range   Vitamin B-12 1,269 (H) 180 - 914 pg/mL  Sedimentation rate     Status: Abnormal   Collection Time: 07/17/17  6:58 AM  Result Value Ref Range   Sed Rate 82 (H) 0 - 22 mm/hr  TSH     Status: None   Collection Time: 07/17/17  6:58 AM  Result Value Ref Range   TSH 2.933 0.350 - 4.500 uIU/mL  Glucose, capillary     Status: Abnormal   Collection Time: 07/17/17  7:10 AM  Result Value Ref Range   Glucose-Capillary 101 (H) 65 - 99 mg/dL  Urine rapid drug screen (hosp performed)     Status: None   Collection Time: 07/17/17  7:37 AM  Result Value Ref Range   Opiates NONE DETECTED NONE DETECTED   Cocaine NONE DETECTED NONE DETECTED   Benzodiazepines NONE DETECTED NONE DETECTED   Amphetamines NONE DETECTED NONE DETECTED   Tetrahydrocannabinol NONE DETECTED NONE DETECTED   Barbiturates NONE DETECTED NONE DETECTED  Urinalysis, Routine w reflex microscopic     Status: Abnormal   Collection Time: 07/17/17  7:38 AM  Result Value Ref Range   Color, Urine YELLOW YELLOW   APPearance HAZY (A) CLEAR   Specific Gravity, Urine 1.010 1.005 - 1.030   pH 7.0 5.0 - 8.0   Glucose, UA NEGATIVE NEGATIVE mg/dL   Hgb urine dipstick NEGATIVE NEGATIVE   Bilirubin Urine NEGATIVE NEGATIVE   Ketones, ur 20 (A) NEGATIVE mg/dL   Protein, ur NEGATIVE NEGATIVE mg/dL   Nitrite NEGATIVE NEGATIVE   Leukocytes, UA MODERATE (A) NEGATIVE   RBC / HPF 0-5 0 - 5 RBC/hpf   WBC, UA 6-30 0 - 5 WBC/hpf   Bacteria, UA FEW (A) NONE SEEN   Squamous Epithelial / LPF NONE SEEN NONE SEEN   WBC Clumps PRESENT    Mucus PRESENT   Troponin I (q 6hr x 3)     Status: None   Collection Time: 07/17/17  3:24 PM  Result Value Ref Range   Troponin I <0.03 <0.03 ng/mL  Glucose, capillary  Status: Abnormal   Collection Time: 07/17/17  5:18 PM  Result Value Ref Range   Glucose-Capillary  116 (H) 65 - 99 mg/dL  Glucose, capillary     Status: None   Collection Time: 07/17/17  8:08 PM  Result Value Ref Range   Glucose-Capillary 94 65 - 99 mg/dL   Comment 1 Notify RN    Comment 2 Document in Chart   Basic metabolic panel     Status: Abnormal   Collection Time: 07/18/17  2:28 AM  Result Value Ref Range   Sodium 139 135 - 145 mmol/L   Potassium 3.4 (L) 3.5 - 5.1 mmol/L   Chloride 108 101 - 111 mmol/L   CO2 20 (L) 22 - 32 mmol/L   Glucose, Bld 94 65 - 99 mg/dL   BUN 10 6 - 20 mg/dL   Creatinine, Ser 4.74 0.44 - 1.00 mg/dL   Calcium 8.7 (L) 8.9 - 10.3 mg/dL   GFR calc non Af Amer >60 >60 mL/min   GFR calc Af Amer >60 >60 mL/min   Anion gap 11 5 - 15  CBC     Status: Abnormal   Collection Time: 07/18/17  2:28 AM  Result Value Ref Range   WBC 4.4 4.0 - 10.5 K/uL   RBC 3.89 3.87 - 5.11 MIL/uL   Hemoglobin 11.3 (L) 12.0 - 15.0 g/dL   HCT 25.9 (L) 56.3 - 87.5 %   MCV 86.4 78.0 - 100.0 fL   MCH 29.0 26.0 - 34.0 pg   MCHC 33.6 30.0 - 36.0 g/dL   RDW 64.3 32.9 - 51.8 %   Platelets 164 150 - 400 K/uL   Dg Chest 2 View  Result Date: 07/16/2017 CLINICAL DATA:  Altered mental status. EXAM: CHEST  2 VIEW COMPARISON:  July 11, 2017 FINDINGS: The heart size and mediastinal contours are within normal limits. Both lungs are clear. The visualized skeletal structures are unremarkable. IMPRESSION: No active cardiopulmonary disease. Electronically Signed   By: Gerome Sam III M.D   On: 07/16/2017 19:38   Ct Head Wo Contrast  Result Date: 07/16/2017 CLINICAL DATA:  Slurred speech with right-sided weakness EXAM: CT HEAD WITHOUT CONTRAST TECHNIQUE: Contiguous axial images were obtained from the base of the skull through the vertex without intravenous contrast. COMPARISON:  None. FINDINGS: Brain: There is an area of decreased attenuation in the left centrum semi ovale consistent with acute to subacute ischemia. Mild basal ganglia calcifications are seen. No other areas of acute  ischemia are noted. No findings to suggest acute hemorrhage are seen. Vascular: No hyperdense vessel or unexpected calcification. Skull: Normal. Negative for fracture or focal lesion. Sinuses/Orbits: No acute finding. Other: None. IMPRESSION: Area of acute to subacute ischemia within the centrum semi ovale on the left. No other focal abnormality is noted. Critical Value/emergent results were called by telephone at the time of interpretation on 07/16/2017 at 7:52 pm to Dr. Lynden Oxford , who verbally acknowledged these results. Electronically Signed   By: Alcide Clever M.D.   On: 07/16/2017 19:52   Ct Angio Neck W Or Wo Contrast  Result Date: 07/17/2017 CLINICAL DATA:  Acute LEFT MCA territory lenticulostriate infarct. Continued surveillance. EXAM: CT ANGIOGRAPHY NECK TECHNIQUE: Multidetector CT imaging of the neck was performed using the standard protocol during bolus administration of intravenous contrast. Multiplanar CT image reconstructions and MIPs were obtained to evaluate the vascular anatomy. Carotid stenosis measurements (when applicable) are obtained utilizing NASCET criteria, using the distal internal carotid diameter as the  denominator. CONTRAST:  Isovue 370, 50 mL. COMPARISON:  MRI brain 07/17/2017.  CT head 07/16/2017. FINDINGS: Aortic arch: Dolichoectasia.  No proximal stenosis. Right carotid system: No evidence of dissection, stenosis (50% or greater) or occlusion. Minor atheromatous change. Left carotid system: No evidence of dissection, stenosis (50% or greater) or occlusion. Minor atheromatous change. Vertebral arteries: Codominant. No evidence of dissection, stenosis (50% or greater) or occlusion. Skeleton: Spondylosis.  Edentulous. Other neck: No masses. Upper chest: No pneumothorax or nodules. IMPRESSION: Minor carotid bifurcation atherosclerosis. No extracranial flow reducing lesion lesion or dissection. Electronically Signed   By: Elsie Stain M.D.   On: 07/17/2017 13:10   Mr  Brain Wo Contrast  Result Date: 07/17/2017 CLINICAL DATA:  76 y/o F; slurred speech and right-sided weakness. Abnormal CT of head. EXAM: MRI HEAD WITHOUT CONTRAST MRA HEAD WITHOUT CONTRAST TECHNIQUE: Multiplanar, multiecho pulse sequences of the brain and surrounding structures were obtained without intravenous contrast. Angiographic images of the head were obtained using MRA technique without contrast. COMPARISON:  07/16/2017 CT head FINDINGS: MRI HEAD FINDINGS Brain: Focus of reduced diffusion compatible with acute/ early subacute infarction involving left posterior putamen, caudate body, corona radiata measuring 2.8 x 1.5 x 1.6 cm (volume = 3.5 cm^3)(AP x ML x CC series 3, image 28 and series 5, image 18). The hemorrhage has associated T2 FLAIR hyperintensity. No hemorrhage or significant mass effect. Few nonspecific foci of T2 FLAIR hyperintense signal abnormality in subcortical and periventricular white matter are compatible with mild chronic microvascular ischemic changes for age. Mild brain parenchymal volume loss. No hydrocephalus, extra-axial collection, or effacement of basilar cisterns. Vascular: As below. Skull and upper cervical spine: Normal marrow signal. Sinuses/Orbits: Negative. Other: None. MRA HEAD FINDINGS Anterior circulation: No large vessel occlusion, aneurysm, or significant stenosis is identified. Posterior circulation: No large vessel occlusion, aneurysm, or significant stenosis is identified. Anatomic variation: Bilateral fetal PCA and patent anterior communicating artery. IMPRESSION: 1. Acute/early subacute infarction involving left posterior putamen, caudate body, and corona radiata measuring 3.5 cc. No acute hemorrhage or mass effect. 2. Background of mild chronic microvascular ischemic changes and mild parenchymal volume loss of the brain. 3. Patent circle of Willis. No large vessel occlusion, aneurysm, or significant stenosis is identified. These results will be called to the  ordering clinician or representative by the Radiologist Assistant, and communication documented in the PACS or zVision Dashboard. Electronically Signed   By: Mitzi Hansen M.D.   On: 07/17/2017 02:33   Dg Swallowing Func-speech Pathology  Result Date: 07/17/2017 Please refer to "Notes" tab for Speech Pathology notes.  Mr Shirlee Latch VF Contrast  Result Date: 07/17/2017 CLINICAL DATA:  76 y/o F; slurred speech and right-sided weakness. Abnormal CT of head. EXAM: MRI HEAD WITHOUT CONTRAST MRA HEAD WITHOUT CONTRAST TECHNIQUE: Multiplanar, multiecho pulse sequences of the brain and surrounding structures were obtained without intravenous contrast. Angiographic images of the head were obtained using MRA technique without contrast. COMPARISON:  07/16/2017 CT head FINDINGS: MRI HEAD FINDINGS Brain: Focus of reduced diffusion compatible with acute/ early subacute infarction involving left posterior putamen, caudate body, corona radiata measuring 2.8 x 1.5 x 1.6 cm (volume = 3.5 cm^3)(AP x ML x CC series 3, image 28 and series 5, image 18). The hemorrhage has associated T2 FLAIR hyperintensity. No hemorrhage or significant mass effect. Few nonspecific foci of T2 FLAIR hyperintense signal abnormality in subcortical and periventricular white matter are compatible with mild chronic microvascular ischemic changes for age. Mild brain parenchymal volume loss. No hydrocephalus,  extra-axial collection, or effacement of basilar cisterns. Vascular: As below. Skull and upper cervical spine: Normal marrow signal. Sinuses/Orbits: Negative. Other: None. MRA HEAD FINDINGS Anterior circulation: No large vessel occlusion, aneurysm, or significant stenosis is identified. Posterior circulation: No large vessel occlusion, aneurysm, or significant stenosis is identified. Anatomic variation: Bilateral fetal PCA and patent anterior communicating artery. IMPRESSION: 1. Acute/early subacute infarction involving left posterior  putamen, caudate body, and corona radiata measuring 3.5 cc. No acute hemorrhage or mass effect. 2. Background of mild chronic microvascular ischemic changes and mild parenchymal volume loss of the brain. 3. Patent circle of Willis. No large vessel occlusion, aneurysm, or significant stenosis is identified. These results will be called to the ordering clinician or representative by the Radiologist Assistant, and communication documented in the PACS or zVision Dashboard. Electronically Signed   By: Mitzi Hansen M.D.   On: 07/17/2017 02:33    Assessment/Plan: Diagnosis: Left basal ganglia infarct Labs and images independently reviewed.  Records reviewed and summated above. Stroke: Continue secondary stroke prophylaxis and Risk Factor Modification listed below:   Antiplatelet therapy:   Blood Pressure Management:  Continue current medication with prn's with permisive HTN per primary team Statin Agent:   Right sided hemiparesis: fit for orthosis to prevent contractures (resting hand splint for day, wrist cock up splint at night, etc) Motor recovery: Fluoxetine  1. Does the need for close, 24 hr/day medical supervision in concert with the patient's rehab needs make it unreasonable for this patient to be served in a less intensive setting? Yes 2. Co-Morbidities requiring supervision/potential complications: HTN (monitor and provide prns in accordance with increased physical exertion and pain), hyperlipidemia (cont meds), diabetes mellitus (Monitor in accordance with exercise and adjust meds as necessary), diastolic dysfunction (monitor for signs/symptoms of fluid overload), post-stroke dysphagia (advance diet as tolerated), hypokalemia (continue to monitor and replete as necessary), ABLA (transfuse if necessary to ensure appropriate perfusion for increased activity tolerance) 3. Due to safety, disease management, medication administration and patient education, does the patient require 24 hr/day  rehab nursing? Yes 4. Does the patient require coordinated care of a physician, rehab nurse, PT (1-2 hrs/day, 5 days/week), OT (1-2 hrs/day, 5 days/week) and SLP (1-2 hrs/day, 5 days/week) to address physical and functional deficits in the context of the above medical diagnosis(es)? Yes Addressing deficits in the following areas: balance, endurance, locomotion, strength, transferring, bathing, dressing, grooming, toileting, speech, swallowing and psychosocial support 5. Can the patient actively participate in an intensive therapy program of at least 3 hrs of therapy per day at least 5 days per week? Yes 6. The potential for patient to make measurable gains while on inpatient rehab is excellent 7. Anticipated functional outcomes upon discharge from inpatient rehab are modified independent and supervision  with PT, modified independent and supervision with OT, modified independent with SLP. 8. Estimated rehab length of stay to reach the above functional goals is: 7-11 days. 9. Anticipated D/C setting: Home 10. Anticipated post D/C treatments: HH therapy and Home excercise program 11. Overall Rehab/Functional Prognosis: good  RECOMMENDATIONS: This patient's condition is appropriate for continued rehabilitative care in the following setting: CIR after completion of medical workup Patient has agreed to participate in recommended program. Yes Note that insurance prior authorization may be required for reimbursement for recommended care.  Comment: Rehab Admissions Coordinator to follow up.  Maryla Morrow, MD, ABPMR Mariam Dollar J., PA-C 07/18/2017

## 2017-07-18 NOTE — Progress Notes (Signed)
Physical Therapy Treatment Patient Details Name: Kristi David MRN: [id number] DOB: 04/22/1941 Today's Date: 07/18/2017    History of Present Illness Pt is 76 y.o. female presented to the ED with R sided weakness and slurred speech. PMH significant of HTN, hyperlipidemia, and Type II DM. MRI revealing: Acute/early subacute infarction involving left posterior putamen, caudate body, and corona radiata measuring 3.5 cc. PMH includes:  DM, HTN, h/o syncope, s/po Rt partial TKA, s/p cataract surgery    PT Comments    Pt progressing towards goals. Worked on visual scanning to the R during ambulation secondary to R sided inattention as pt continues to exhibit inattention to the R. Required cues to avoid obstacles on the R. Worked on weightshifting on RUE and R elbow props as well during sitting this session. Continue to recommend CIR at d/c to increase independence and safety with functional mobility, as pt is very motivated to return to baseline and pt has good family support from her son. Will continue to follow acutely to maximize functional mobility independence and safety.   Follow Up Recommendations  CIR     Equipment Recommendations  None recommended by PT    Recommendations for Other Services Rehab consult     Precautions / Restrictions Precautions Precautions: Fall Restrictions Weight Bearing Restrictions: No    Mobility  Bed Mobility Overal bed mobility: Needs Assistance Bed Mobility: Sit to Supine;Supine to Sit     Supine to sit: Min assist Sit to supine: Min guard   General bed mobility comments: Min A for trunk elevation and for RLE lift assist for return to supine.   Transfers Overall transfer level: Needs assistance Equipment used: Rolling walker (2 wheeled) Transfers: Sit to/from Stand Sit to Stand: Min guard         General transfer comment: Min guard for steadying assist. Verbal cues for safe hand placement. Increased time and effort required to stand.    Ambulation/Gait Ambulation/Gait assistance: Min assist;Min guard Ambulation Distance (Feet): 25 Feet Assistive device: Rolling walker (2 wheeled) Gait Pattern/deviations: Step-to pattern;Decreased stride length;Decreased dorsiflexion - right;Drifts right/left Gait velocity: Decreased Gait velocity interpretation: Below normal speed for age/gender General Gait Details: Gait within the room this session. Worked on visual scanning to the R during ambulation to increase awareness to R side, however, pt continues to run into obstacles on the R. Required cues for R step height and foot clearance secondary to decreased DF. During gait, IR MD entered room and pt's food delivered, so ambulation distance shortened this session.    Stairs            Wheelchair Mobility    Modified Rankin (Stroke Patients Only) Modified Rankin (Stroke Patients Only) Pre-Morbid Rankin Score: No symptoms Modified Rankin: Moderately severe disability     Balance Overall balance assessment: Needs assistance Sitting-balance support: Feet supported;No upper extremity supported Sitting balance-Leahy Scale: Good     Standing balance support: During functional activity;No upper extremity supported Standing balance-Leahy Scale: Poor Standing balance comment: Reliant on UE support for balance                             Cognition Arousal/Alertness: Awake/alert Behavior During Therapy: WFL for tasks assessed/performed Overall Cognitive Status: Impaired/Different from baseline Area of Impairment: Following commands;Safety/judgement;Awareness;Problem solving;Attention                   Current Attention Level: Selective   Following Commands: Follows one step commands consistently;Follows multi-step  commands inconsistently Safety/Judgement: Decreased awareness of safety;Decreased awareness of deficits Awareness: Intellectual Problem Solving: Slow processing;Requires verbal cues;Requires  tactile cues;Difficulty sequencing        Exercises Other Exercises Other Exercises: RUE elbow props X 10 with min A in sitting.  Other Exercises: RUE weightshifting to the R in sitting; required blocking at elbow to ensure elbow extension during weightshifting.     General Comments General comments (skin integrity, edema, etc.): Son present during session. Educated about need for supervision with eating secondary to SLP recommendations, so waited for NT to enter room before giving tray.       Pertinent Vitals/Pain Pain Assessment: No/denies pain Faces Pain Scale: No hurt    Home Living     Available Help at Discharge: Family;Available PRN/intermittently Type of Home: Independent living facility              Prior Function            PT Goals (current goals can now be found in the care plan section) Acute Rehab PT Goals Patient Stated Goal: To get back to normal  PT Goal Formulation: With patient/family Time For Goal Achievement: 07/31/17 Potential to Achieve Goals: Good Progress towards PT goals: Progressing toward goals    Frequency    Min 3X/week      PT Plan Current plan remains appropriate    Co-evaluation              AM-PAC PT "6 Clicks" Daily Activity  Outcome Measure  Difficulty turning over in bed (including adjusting bedclothes, sheets and blankets)?: A Lot Difficulty moving from lying on back to sitting on the side of the bed? : Unable Difficulty sitting down on and standing up from a chair with arms (e.g., wheelchair, bedside commode, etc,.)?: Unable Help needed moving to and from a bed to chair (including a wheelchair)?: A Little Help needed walking in hospital room?: A Lot Help needed climbing 3-5 steps with a railing? : A Lot 6 Click Score: 11    End of Session Equipment Utilized During Treatment: Gait belt Activity Tolerance: Patient tolerated treatment well Patient left: in bed;with call bell/phone within reach;with  nursing/sitter in room;with family/visitor present Nurse Communication: Mobility status PT Visit Diagnosis: Hemiplegia and hemiparesis;Unsteadiness on feet (R26.81);Other abnormalities of gait and mobility (R26.89) Hemiplegia - Right/Left: Right Hemiplegia - dominant/non-dominant: Dominant Hemiplegia - caused by: Cerebral infarction     Time: [id number] PT Time Calculation (min) (ACUTE ONLY): 17 min  Charges:  $Gait Training: 8-22 mins                    G Codes:       Gladys DammeBrittany Doylene Splinter, PT, DPT  Acute Rehabilitation Services  Pager: [phone]    Lehman PromBrittany S Uzair Godley 07/18/2017, 12:12 PM

## 2017-07-18 NOTE — Evaluation (Signed)
Speech Language Pathology Evaluation Patient Details Name: Kristi David MRN: [id number] DOB: 04/25/1941 Today's Date: 07/18/2017 Time: [id number] SLP Time Calculation (min) (ACUTE ONLY): 25 min  Problem List:  Patient Active Problem List   Diagnosis Date Noted  . Hyperlipidemia   . Stroke (HCC) 07/16/2017  . Hypertension 07/16/2017  . Arthritis of right hand 01/11/2017  . Osteoarthritis of right knee 01/06/2016  . Status post right partial knee replacement 01/06/2016   Past Medical History:  Past Medical History:  Diagnosis Date  . Anemia   . Arthritis    "legs, knees" (01/07/2016)  . GERD (gastroesophageal reflux disease)    no meds  . High cholesterol   . History of blood transfusion   . Hypertension   . Seasonal allergies   . Shortness of breath dyspnea    if too active  . Syncope    passed out and was hospitalized for a couple of days  . Type II diabetes mellitus (HCC)    does not take medicine diet controlled   Past Surgical History:  Past Surgical History:  Procedure Laterality Date  . BUNIONECTOMY Bilateral   . CATARACT EXTRACTION W/ INTRAOCULAR LENS  IMPLANT, BILATERAL Bilateral   . INGUINAL HERNIA REPAIR Right   . KNEE ARTHROSCOPY WITH EXCISION BAKER'S CYST Right   . MR LOWER LEG LEFT ([facility] HX) Left    after a break  . PARTIAL KNEE ARTHROPLASTY Right 01/06/2016   Procedure: RIGHT UNICOMPARTMENTAL KNEE ARTHROPLASTY;  Surgeon: Kathryne Hitchhristopher Y Blackman, MD;  [facility];  Service: Orthopedics;  Laterality: Right;  . VEIN LIGATION AND STRIPPING Bilateral    HPI:  Falon Dorseyis an 76 y.o.femalewho presents to the ED from her independent living facility for evaluation of right sided weakness with facial droop which started on Thursday. The patient'sson called hertoday and noted slurred speech. On presentation to the ED, the patient was visibly weak on the right, also with slurred speech and right facial droop. She has no prior history of stroke. Denies  confusion, trouble getting words out or difficulty comprehending speech. CT in the ED reveals an area of subacute ischemia within the centrum semiovale on the left. MRI showed Acute/early subacute infarction involving left posterior putamen, caudate body, and corona radiata measuring 3.5 cc.   Assessment / Plan / Recommendation Clinical Impression  Pt presents with moderate dysarthria and mild-moderate cognitive deficits secondary to left CVA. No expressive/receptive language deficits noted. 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 SLP educated pt on SLOP compensatory strategy to increase intelligibility (Slow, Loud, Over-articulate, Pause between words); pt demonstrated understanding and execution of strategy use with moderate verbal reinforcement needed. Pt demonstrates cognitive deficits in the areas of executive functioning with basic functional tasks (problem solving, reasoning, self-monitoring, self correcting) and emergent awareness. Pt would often attempt to use her right arm/hand to complete tasks; mild concern for safety. Per pt report, she lives independently and handles all responsibilities (paying bills, medications, house tasks) independently as well. Will continue to monitor acutely for cognitive tx focusing on executive function tasks and dysarthria. Post d/c, recommend CIR or home health SLP.     SLP Assessment  SLP Recommendation/Assessment: Patient needs continued Speech Lanaguage Pathology Services SLP Visit Diagnosis: Dysarthria and anarthria (R47.1);Cognitive communication deficit (R41.841)    Follow Up Recommendations  Inpatient Rehab    Frequency and Duration min 2x/week  2 weeks      SLP Evaluation Cognition  Overall Cognitive Status: Impaired/Different from  baseline Arousal/Alertness: Awake/alert Orientation Level: Oriented X4 Attention: Sustained Sustained Attention: Appears intact Memory: Appears intact Awareness: Impaired Awareness Impairment: Emergent impairment Problem Solving: Impaired Problem Solving Impairment: Functional complex Executive Function: Self Correcting;Self Monitoring;Reasoning Reasoning: Impaired Reasoning Impairment: Functional basic Self Monitoring: Impaired Self Monitoring Impairment: Functional basic Self Correcting: Impaired Self Correcting Impairment: Functional complex Safety/Judgment: Impaired       Comprehension  Auditory Comprehension Overall Auditory Comprehension: Appears within functional limits for tasks assessed Visual Recognition/Discrimination Discrimination: Not tested Reading Comprehension Reading Status: Not tested    Expression Expression Primary Mode of Expression: Verbal Verbal Expression Overall Verbal Expression: Appears within functional limits for tasks assessed Written Expression Dominant Hand: Right Written Expression: Not tested   Oral / Motor  Oral Motor/Sensory Function Overall Oral Motor/Sensory Function: Moderate impairment Facial ROM: Reduced right;Suspected CN VII (facial) dysfunction Facial Symmetry: Abnormal symmetry right;Suspected CN VII (facial) dysfunction Facial Strength: Reduced right;Suspected CN VII (facial) dysfunction Lingual ROM: Reduced left;Suspected CN XII (hypoglossal) dysfunction Lingual Symmetry: Suspected CN XII (hypoglossal) dysfunction;Other (Comment) (tongue deviates R) Lingual Strength: Reduced;Suspected CN XII (hypoglossal) dysfunction Velum: Within Functional Limits Mandible: Within Functional Limits Motor Speech Overall Motor Speech: Impaired Respiration: Within functional limits Phonation: Normal;Low vocal intensity Resonance: Within functional limits Articulation: Impaired Level of Impairment: Word Intelligibility: Intelligibility  reduced Word: 50-74% accurate Phrase: 50-74% accurate Sentence: 25-49% accurate Conversation: 25-49% accurate Motor Planning: Witnin functional limits Motor Speech Errors: Not applicable Effective Techniques: Slow rate;Increased vocal intensity;Over-articulate;Pause   GO                    Carmela Rima, Student SLP 07/18/2017, 10:02 AM

## 2017-07-18 NOTE — Care Management Note (Signed)
Case Management Note  Patient Details  Name: Kristi David MRN: [id number] Date of Birth: 02/19/1941  Subjective/Objective:                    Action/Plan: Pt discharging to CIR today. No further needs per CM.  Expected Discharge Date:                  Expected Discharge Plan:  IP Rehab Facility  In-House Referral:     Discharge planning Services  CM Consult  Post Acute Care Choice:    Choice offered to:     DME Arranged:    DME Agency:     HH Arranged:    HH Agency:     Status of Service:  Completed, signed off  If discussed at [employer] Length of Stay Meetings, dates discussed:    Additional Comments:  [name], RN 07/18/2017, 3:00 PM

## 2017-07-18 NOTE — Progress Notes (Signed)
Rehab admissions - I met with patient and her son at the bedside.  They would like inpatient rehab admission.  Bed available and patient is medically ready for admit today per attending MD.  Will admit to acute inpatient rehab today.  Call me for questions.  #[phone]

## 2017-07-18 NOTE — Progress Notes (Signed)
Patient arrived to unit with family. Oriented to unit procedures.l Bags unpacked

## 2017-07-19 ENCOUNTER — Inpatient Hospital Stay (HOSPITAL_COMMUNITY): Payer: Medicare Other

## 2017-07-19 ENCOUNTER — Inpatient Hospital Stay (HOSPITAL_COMMUNITY): Payer: Medicare Other | Admitting: Physical Therapy

## 2017-07-19 ENCOUNTER — Inpatient Hospital Stay (HOSPITAL_COMMUNITY): Payer: Medicare Other | Admitting: Occupational Therapy

## 2017-07-19 DIAGNOSIS — I639 Cerebral infarction, unspecified: Secondary | ICD-10-CM

## 2017-07-19 LAB — CBC WITH DIFFERENTIAL/PLATELET
BASOS ABS: 0 10*3/uL (ref 0.0–0.1)
BASOS PCT: 1 %
EOS ABS: 0.1 10*3/uL (ref 0.0–0.7)
EOS PCT: 2 %
HCT: 37.2 % (ref 36.0–46.0)
Hemoglobin: 12.2 g/dL (ref 12.0–15.0)
Lymphocytes Relative: 38 %
Lymphs Abs: 1.8 10*3/uL (ref 0.7–4.0)
MCH: 27.8 pg (ref 26.0–34.0)
MCHC: 32.8 g/dL (ref 30.0–36.0)
MCV: 84.7 fL (ref 78.0–100.0)
MONO ABS: 0.2 10*3/uL (ref 0.1–1.0)
MONOS PCT: 5 %
Neutro Abs: 2.5 10*3/uL (ref 1.7–7.7)
Neutrophils Relative %: 54 %
PLATELETS: 222 10*3/uL (ref 150–400)
RBC: 4.39 MIL/uL (ref 3.87–5.11)
RDW: 14.5 % (ref 11.5–15.5)
WBC: 4.7 10*3/uL (ref 4.0–10.5)

## 2017-07-19 LAB — COMPREHENSIVE METABOLIC PANEL
ALBUMIN: 3.4 g/dL — AB (ref 3.5–5.0)
ALK PHOS: 76 U/L (ref 38–126)
ALT: 17 U/L (ref 14–54)
ANION GAP: 10 (ref 5–15)
AST: 28 U/L (ref 15–41)
BILIRUBIN TOTAL: 0.6 mg/dL (ref 0.3–1.2)
BUN: 11 mg/dL (ref 6–20)
CALCIUM: 9.1 mg/dL (ref 8.9–10.3)
CO2: 21 mmol/L — ABNORMAL LOW (ref 22–32)
Chloride: 109 mmol/L (ref 101–111)
Creatinine, Ser: 0.87 mg/dL (ref 0.44–1.00)
GFR calc Af Amer: >60 mL/min (ref >60)
GFR calc non Af Amer: >60 mL/min (ref >60)
GLUCOSE: 126 mg/dL — AB (ref 65–99)
Potassium: 3.5 mmol/L (ref 3.5–5.1)
SODIUM: 140 mmol/L (ref 135–145)
TOTAL PROTEIN: 7.3 g/dL (ref 6.5–8.1)

## 2017-07-19 LAB — FOLATE RBC
FOLATE, HEMOLYSATE: 487.4 ng/mL
FOLATE, RBC: 1413 ng/mL (ref >498)
Hematocrit: 34.5 % (ref 34.0–46.6)

## 2017-07-19 LAB — GLUCOSE, CAPILLARY
GLUCOSE-CAPILLARY: 111 mg/dL — AB (ref 65–99)
Glucose-Capillary: 94 mg/dL (ref 65–99)
Glucose-Capillary: 89 mg/dL (ref 65–99)
Glucose-Capillary: 104 mg/dL — ABNORMAL HIGH (ref 65–99)

## 2017-07-19 LAB — ANTINUCLEAR ANTIBODIES, IFA: ANA Ab, IFA: NEGATIVE

## 2017-07-19 NOTE — Progress Notes (Signed)
Patient information reviewed and entered into eRehab system by Luberta Grabinski, RN, CRRN, PPS Coordinator.  Information including medical coding and functional independence measure will be reviewed and updated through discharge.     Per nursing patient was given "Data Collection Information Summary for Patients in Inpatient Rehabilitation Facilities with attached "Privacy Act Statement-Health Care Records" upon admission.  

## 2017-07-19 NOTE — Evaluation (Signed)
Physical Therapy Assessment and Plan  Patient Details  Name: Kristi David MRN: [id number] Date of Birth: 10-Nov-1940  PT Diagnosis: Abnormal posture, Abnormality of gait, Difficulty walking, Hemiparesis dominant, Impaired cognition, Impaired sensation and Muscle weakness Rehab Potential: Good ELOS: 12-14 days    Today's Date: 07/19/2017 PT Individual Time: 0700-0800 PT Individual Time Calculation (min): 60 min    Problem List:  Patient Active Problem List   Diagnosis Date Noted  . Left basal ganglia embolic stroke ([place]) 40/98/1191  . Facial droop   . Right sided weakness   . Diabetes mellitus ([place])   . Diastolic dysfunction   . Dysphagia, post-stroke   . Dysarthria, post-stroke   . Hypokalemia   . Acute blood loss anemia   . Hyperlipidemia   . Stroke ([place]) 07/16/2017  . Hypertension 07/16/2017  . Arthritis of right hand 01/11/2017  . Osteoarthritis of right knee 01/06/2016  . Status post right partial knee replacement 01/06/2016    Past Medical History:  Past Medical History:  Diagnosis Date  . Anemia   . Arthritis    "legs, knees" (01/07/2016)  . GERD (gastroesophageal reflux disease)    no meds  . High cholesterol   . History of blood transfusion   . Hypertension   . Seasonal allergies   . Shortness of breath dyspnea    if too active  . Syncope    passed out and was hospitalized for a couple of days  . Type II diabetes mellitus ([facility])    does not take medicine diet controlled   Past Surgical History:  Past Surgical History:  Procedure Laterality Date  . BUNIONECTOMY Bilateral   . CATARACT EXTRACTION W/ INTRAOCULAR LENS  IMPLANT, BILATERAL Bilateral   . INGUINAL HERNIA REPAIR Right   . KNEE ARTHROSCOPY WITH EXCISION BAKER'S CYST Right   . MR LOWER LEG LEFT ([place] [facility]) Left    after a break  . PARTIAL KNEE ARTHROPLASTY Right 01/06/2016   Procedure: RIGHT UNICOMPARTMENTAL KNEE ARTHROPLASTY;  Surgeon: Mcarthur Rossetti, MD;  Location: [facility];  Service:  Orthopedics;  Laterality: Right;  . VEIN LIGATION AND STRIPPING Bilateral     Assessment & Plan Clinical Impression: Kristi Dorseyis a 76 y.o.right handed femalewith history of hypertension, hyperlipidemia, diabetes mellitus. Per chart review, patient, and son, patient lives alone. Independent prior to admission but does not drive. One level apartment with elevator. Presented 07/16/2017 with right sided weakness and slurred speech. Cranial CT scan showed areas of acute to subacute ischemia within the centrum semiovale ovale on the left. No other focal abnormality noted. Patient did not receive TPA. MRI reviewed, showing left basal ganglia infarct. Per report, an acute early subacute infarction left posterior putamen, caudate body and corona radiata. MRA with no large vessel occlusion aneurysm or stenosis. CT angiogram of neck no extracranial flow reducing lesion or dissection identified. Lower extremity Dopplers negative for DVT. Echocardiogram with ejection fraction of 47% grade 1 diastolic dysfunction. Maintained on Plavix for CVA prophylaxis. Dysphagia #2 nectar thick liquid diet. Patient seen working with physical therapy with balance deficits and difficulties with motor planning. Physical and occupational therapy evaluations completed with recommendations of physical medicine rehabilitation consult. Patient was admitted for a comprehensive rehabilitation program  Patient transferred to CIR on 07/18/2017 .   Patient currently requires min with mobility secondary to muscle weakness and muscle paralysis, decreased cardiorespiratoy endurance, unbalanced muscle activation and decreased coordination, decreased attention to right, delayed processing and decreased standing balance, decreased postural control, hemiplegia and decreased  balance strategies.  Prior to hospitalization, patient was independent  with mobility and lived with Alone in a Independent living facility home.  Home access is   Elevator.  Patient will benefit from skilled PT intervention to maximize safe functional mobility, minimize fall risk and decrease caregiver burden for planned discharge home with 24 hour supervision.  Anticipate patient will benefit from follow up [place] at discharge.  PT - End of Session Activity Tolerance: Tolerates 30+ min activity with multiple rests Endurance Deficit: Yes Endurance Deficit Description: requires seated rest breaks following short duration mobility activities PT Assessment Rehab Potential (ACUTE/IP ONLY): Good PT Barriers to Discharge: Decreased caregiver support;Lack of/limited family support PT Barriers to Discharge Comments: pt reports no family member able to provide 24/7 PT Patient demonstrates impairments in the following area(s): Balance;Endurance;Motor;Perception;Safety;Sensory;Skin Integrity PT Transfers Functional Problem(s): Bed Mobility;Bed to Chair;Car;Furniture PT Locomotion Functional Problem(s): Ambulation;Stairs PT Plan PT Intensity: Minimum of 1-2 x/day ,45 to 90 minutes PT Frequency: 5 out of 7 days PT Duration Estimated Length of Stay: 12-14 days  PT Treatment/Interventions: Ambulation/gait training;Discharge planning;Functional mobility training;Psychosocial support;Therapeutic Activities;Visual/perceptual remediation/compensation;Therapeutic Exercise;Neuromuscular re-education;Disease management/prevention;Balance/vestibular training;Skin care/wound management;Cognitive remediation/compensation;DME/adaptive equipment instruction;Splinting/orthotics;UE/LE Strength taining/ROM;UE/LE Coordination activities;Stair training;Patient/family education;Functional electrical stimulation;Community reintegration PT Transfers Anticipated Outcome(s): modI PT Locomotion Anticipated Outcome(s): S in home and community PT Recommendation Recommendations for Other Services: Speech consult;Neuropsych consult;Therapeutic Recreation consult Therapeutic Recreation  Interventions: Pet therapy;Kitchen group;Outing/community reintergration Follow Up Recommendations: Home health PT Patient destination: Home Equipment Recommended: None recommended by PT  Skilled Therapeutic Intervention Pt received supine in bed, denies pain and agreeable to treatment. PT initial evaluation performed and completed as described below with minA overall d/t R hemiparesis, R inattention and balance impairments. Educated pt in rehab process, goals, estimated length of stay to be determined once all therapy evaluations completed. Discussed falls prevention and recommendation that pt use call bell and wait for staff to transfer or get up to use restroom; pt agreeable. Remained seated in w/c at end of session, all needs in reach.   PT Evaluation Precautions/Restrictions Precautions Precautions: Fall Restrictions Weight Bearing Restrictions: No General Chart Reviewed: Yes Response to Previous Treatment: Not applicable Family/Caregiver Present: No Vital SignsTherapy Vitals Temp: 97.8 F (36.6 C) Temp Source: Axillary Pulse Rate: 65 Resp: 18 BP: (!) 154/72 Patient Position (if appropriate): Lying Oxygen Therapy SpO2: 100 % O2 Device: Not Delivered Pain Pain Assessment Pain Assessment: No/denies pain Home Living/Prior Functioning Home Living Available Help at Discharge: Family;Available PRN/intermittently Type of Home: Independent living facility Home Access: Elevator Home Layout: One level  Lives With: Alone Prior Function Level of Independence: Independent with basic ADLs;Independent with gait;Independent with transfers;Independent with homemaking with ambulation  Able to Take Stairs?: Yes Driving: No Vocation: Retired Leisure: Hobbies-yes (Comment) Comments: Reports she likes "going out to the stores"; does her own grocery shopping per chart review Vision/Perception  Vision - Assessment Eye Alignment: Within Functional Limits Ocular Range of Motion: Within  Functional Limits Alignment/Gaze Preference: Within Defined Limits Tracking/Visual Pursuits: Able to track stimulus in all quads without difficulty Perception Perception: Impaired Comments: mild R inattention Praxis Praxis: Intact  Cognition Overall Cognitive Status: Impaired/Different from baseline Arousal/Alertness: Awake/alert Memory: Appears intact Problem Solving: Impaired Problem Solving Impairment: Functional complex Sensation Sensation Light Touch: Impaired by gross assessment (RUE/RLE) Proprioception: Impaired by gross assessment (RUE/RLE) Coordination Gross Motor Movements are Fluid and Coordinated: No Heel Shin Test: decreased speed/excursion RLE Motor  Motor Motor: Hemiplegia Motor - Skilled Clinical Observations: R hemiparesis UE>LE  Mobility Bed Mobility Bed Mobility:  Sit to Supine;Rolling Left;Rolling Right;Supine to Sit Rolling Right: 5: Supervision Rolling Right Details: Verbal cues for technique Rolling Left: 5: Supervision Rolling Left Details: Verbal cues for technique Supine to Sit: 5: Supervision Supine to Sit Details: Verbal cues for technique Supine to Sit Details (indicate cue type and reason): increased time d/t R incoordination Sit to Supine: 5: Supervision Sit to Supine - Details (indicate cue type and reason): increased time d/t RLE incoordination Transfers Transfers: Yes Sit to Stand: 4: Min assist;With armrests Sit to Stand Details: Verbal cues for technique;Verbal cues for precautions/safety;Verbal cues for sequencing Stand Pivot Transfers: 4: Min assist Stand Pivot Transfer Details: Verbal cues for technique;Verbal cues for precautions/safety Locomotion  Ambulation Ambulation: Yes Ambulation/Gait Assistance: 4: Min assist;4: Min guard Ambulation Distance (Feet): 175 Feet Assistive device: None Ambulation/Gait Assistance Details: Verbal cues for technique;Verbal cues for precautions/safety Ambulation/Gait Assistance Details: cues for  attention to R side to avoid obstacles Gait Gait: Yes Gait Pattern: Poor foot clearance - right;Right foot flat;Trendelenburg;Decreased weight shift to right;Decreased stance time - right;Step-through pattern;Decreased stride length Gait velocity: 1.36 ft/sec Stairs / Additional Locomotion Stairs: Yes Stairs Assistance: 4: Min assist;4: Min guard Stairs Assistance Details: Verbal cues for technique;Verbal cues for precautions/safety Stair Management Technique: One rail Left;Step to pattern;Forwards Number of Stairs: 12 Height of Stairs: 3 Ramp: 4: Min Chemical engineer: Yes Wheelchair Assistance: 3: Building surveyor Details: Verbal cues for technique;Verbal cues for precautions/safety;Tactile cues for placement;Verbal cues for sequencing Wheelchair Propulsion: Right upper extremity;Right lower extremity Wheelchair Parts Management: Needs assistance Distance: 92'  Trunk/Postural Assessment  Cervical Assessment Cervical Assessment: Within Functional Limits Thoracic Assessment Thoracic Assessment: Within Functional Limits Lumbar Assessment Lumbar Assessment: Exceptions to [facility] (decreased lumbopelvic dissociation) Postural Control Postural Control: Deficits on evaluation (delayed/inefficient stepping and righting reactions in standing)  Balance Balance Balance Assessed: Yes Standardized Balance Assessment Standardized Balance Assessment: Timed Up and Go Test Timed Up and Go Test TUG: Normal TUG Normal TUG (seconds): 23 Static Sitting Balance Static Sitting - Balance Support: Feet supported;No upper extremity supported Static Sitting - Level of Assistance: 6: Modified independent (Device/Increase time) Dynamic Sitting Balance Dynamic Sitting - Balance Support: Feet supported;No upper extremity supported Dynamic Sitting - Level of Assistance: 5: Stand by assistance Dynamic Sitting - Balance Activities: Lateral lean/weight  shifting;Forward lean/weight shifting;Reaching for objects Static Standing Balance Static Standing - Balance Support: During functional activity;No upper extremity supported Static Standing - Level of Assistance: 5: Stand by assistance Dynamic Standing Balance Dynamic Standing - Balance Support: During functional activity;No upper extremity supported Dynamic Standing - Level of Assistance: 4: Min assist Dynamic Standing - Balance Activities: Lateral lean/weight shifting;Forward lean/weight shifting;Reaching for objects Extremity Assessment  RUE Assessment RUE Assessment: Exceptions to [facility] (pt can elevate shoulder in abd to 30 degrees, no other AROM, tone WFL) LUE Assessment LUE Assessment: Within Functional Limits RLE Assessment RLE Assessment: Exceptions to [facility] RLE Strength RLE Overall Strength: Deficits Right Hip Flexion: 4/5 Right Hip ABduction: 4/5 Right Hip ADduction: 4+/5 Right Knee Flexion: 4-/5 Right Knee Extension: 4-/5 Right Ankle Dorsiflexion: 4-/5 Right Ankle Plantar Flexion: 4+/5 LLE Assessment LLE Assessment: Within Functional Limits   See Function Navigator for Current Functional Status.   Refer to Care Plan for Long Term Goals  Recommendations for other services: Neuropsych and Therapeutic Recreation  Pet therapy, Kitchen group and Outing/community reintegration  Discharge Criteria: Patient will be discharged from PT if patient refuses treatment 3 consecutive times without medical reason, if treatment goals not met,  if there is a change in medical status, if patient makes no progress towards goals or if patient is discharged from hospital.  The above assessment, treatment plan, treatment alternatives and goals were discussed and mutually agreed upon: by patient  Luberta Mutter 07/19/2017, 8:16 AM

## 2017-07-19 NOTE — Progress Notes (Signed)
[facility] PHYSICAL MEDICINE & REHABILITATION     PROGRESS NOTE    Subjective/Complaints: Had a reasonable night. Was able to sleep. Denies pain  ROS: pt denies nausea, vomiting, diarrhea, cough, shortness of breath or chest pain   Objective: Vital Signs: Blood pressure (!) 154/72, pulse 65, temperature 97.8 F (36.6 C), temperature source Axillary, resp. rate 18, height 5\' 7"  (1.702 m), SpO2 100 %. Ct Angio Neck W Or Wo Contrast  Result Date: 07/17/2017 CLINICAL DATA:  Acute LEFT MCA territory lenticulostriate infarct. Continued surveillance. EXAM: CT ANGIOGRAPHY NECK TECHNIQUE: Multidetector CT imaging of the neck was performed using the standard protocol during bolus administration of intravenous contrast. Multiplanar CT image reconstructions and MIPs were obtained to evaluate the vascular anatomy. Carotid stenosis measurements (when applicable) are obtained utilizing NASCET criteria, using the distal internal carotid diameter as the denominator. CONTRAST:  Isovue 370, 50 mL. COMPARISON:  MRI brain 07/17/2017.  CT head 07/16/2017. FINDINGS: Aortic arch: Dolichoectasia.  No proximal stenosis. Right carotid system: No evidence of dissection, stenosis (50% or greater) or occlusion. Minor atheromatous change. Left carotid system: No evidence of dissection, stenosis (50% or greater) or occlusion. Minor atheromatous change. Vertebral arteries: Codominant. No evidence of dissection, stenosis (50% or greater) or occlusion. Skeleton: Spondylosis.  Edentulous. Other neck: No masses. Upper chest: No pneumothorax or nodules. IMPRESSION: Minor carotid bifurcation atherosclerosis. No extracranial flow reducing lesion lesion or dissection. Electronically Signed   By: Elsie StainJohn T Curnes M.D.   On: 07/17/2017 13:10   Dg Swallowing Func-speech Pathology  Result Date: 07/17/2017 Please refer to "Notes" tab for Speech Pathology notes.   Recent Labs  07/17/17 0658 07/18/17 0228  WBC 5.3 4.4  HGB 11.5* 11.3*   HCT 34.8*  34.5 33.6*  PLT 200 164    Recent Labs  07/17/17 0658 07/18/17 0228  NA 141 139  K 3.3* 3.4*  CL 110 108  GLUCOSE 91 94  BUN 10 10  CREATININE 0.87 0.82  CALCIUM 8.7* 8.7*   CBG (last 3)   Recent Labs  07/18/17 1731 07/18/17 2133 07/19/17 0635  GLUCAP 93 103* 94    Wt Readings from Last 3 Encounters:  07/17/17 88 kg (194 lb 0.1 oz)  01/06/16 87.1 kg (192 lb)  12/31/15 87.2 kg (192 lb 3.9 oz)    Physical Exam:  Constitutional: She appears well-developed and well-nourished.  HENT:  Head: Normocephalic and atraumatic.  Eyes: EOM are normal. Right eye exhibits no discharge. Left eye exhibits no discharge.  Neck: Normal range of motion. Neck supple. No thyromegaly present.  Cardiovascular: RRR without murmur. No JVD .   Respiratory: CTA Bilaterally without wheezes or rales. Normal effort .  GI: Soft. Bowel sounds are normal. She exhibits no distension.  Musculoskeletal: She exhibits no edema or tenderness.  Neurological: She is alert.  Makes eye contact with examiner.  Follows simple commands.  Speech dysarthric, right central 7 Motor: LUE/LLE: 5/5 proximal to distal RUE: Shoulder abduction 2 to 2+/5 proximal to distal, elbow flex/ext 2/5, distally 1-2/5 RLE: 4-/5 HF, KE and 4/5 ADF/PF.  Sensation intact to light touch   Skin: Skin is warm and dry.  Psychiatric: She has a normal mood and affect. Her behavior is normal.   Assessment/Plan: 1. Right hemiparesis and functional deficits secondary to right hemiparesis which require 3+ hours per day of interdisciplinary therapy in a comprehensive inpatient rehab setting. Physiatrist is providing close team supervision and 24 hour management of active medical problems listed below. Physiatrist and rehab team  continue to assess barriers to discharge/monitor patient progress toward functional and medical goals.  Function:  Bathing Bathing position      Bathing parts      Bathing assist        Upper  Body Dressing/Undressing Upper body dressing                    Upper body assist        Lower Body Dressing/Undressing Lower body dressing   What is the patient wearing?: Underwear Underwear - Performed by patient: Thread/unthread right underwear leg, Thread/unthread left underwear leg, Pull underwear up/down                            Lower body assist Assist for lower body dressing: Touching or steadying assistance (Pt > 75%)      Toileting Toileting   Toileting steps completed by patient: Adjust clothing prior to toileting, Performs perineal hygiene, Adjust clothing after toileting Toileting steps completed by helper: Adjust clothing prior to toileting, Adjust clothing after toileting Toileting Assistive Devices: Grab bar or rail  Toileting assist Assist level: Touching or steadying assistance (Pt.75%)   Transfers Chair/bed transfer   Chair/bed transfer method: Stand pivot Chair/bed transfer assist level: Touching or steadying assistance (Pt > 75%) Chair/bed transfer assistive device: Walker, Designer, fashion/clothing     Max distance: 175 Assist level: Touching or steadying assistance (Pt > 75%)   Wheelchair   Type: Manual Max wheelchair distance: 75 Assist Level: Moderate assistance (Pt 50 - 74%)  Cognition Comprehension Comprehension assist level: Follows basic conversation/direction with no assist  Expression Expression assist level: Expresses basic 90% of the time/requires cueing < 10% of the time.  Social Interaction Social Interaction assist level: Interacts appropriately 90% of the time - Needs monitoring or encouragement for participation or interaction.  Problem Solving Problem solving assist level: Solves basic 90% of the time/requires cueing < 10% of the time  Memory Memory assist level: Recognizes or recalls 90% of the time/requires cueing < 10% of the time   Medical Problem List and Plan: 1.  Right sided weakness and  dysarthria secondary to left basal ganglia infarction secondary to small vessel disease  -beginning therapies today  -team conference 2.  DVT Prophylaxis/Anticoagulation: SCDs. Monitor for any signs of DVT 3. Pain Management: Voltaren gel as needed 4. Mood: Celexa 40 mg daily 5. Neuropsych: This patient is capable of making decisions on her own behalf. 6. Skin/Wound Care: Routine skin checks 7. Fluids/Electrolytes/Nutrition: encourage PO  -replace potassium 8. Dysphagia. Dysphagia #2 nectar liquids. Follow-up speech therapy 9. Hypertension. Cozaar 100 mg daily, Toprol 25 mg daily  -fair control at present 10. Hyperlipidemia. Lipitor 11. Diabetes mellitus with peripheral neuropathy. Hemoglobin A1c 5.5. Patient diet controlled prior to admission.  -reasonable control at present   LOS (Days) 1 A FACE TO FACE EVALUATION WAS PERFORMED  Ranelle Oyster, MD 07/19/2017 8:57 AM

## 2017-07-19 NOTE — Progress Notes (Signed)
Kristi Fennel, MD Physician Signed Physical Medicine and Rehabilitation  Consult Note Date of Service: 07/18/2017 5:43 AM  Related encounter: ED to Hosp-Admission (Discharged) from 07/16/2017 in [facility]     Expand All Collapse All   [] Hide copied text [] Hover for attribution information      Physical Medicine and Rehabilitation Consult Reason for Consult: Right side weakness and slurred speech Referring Physician: [facility]   HPI: Kristi David is a 76 y.o. right handed female with history of hypertension, hyperlipidemia, diabetes mellitus. Per chart review, patient, and son, patient lives alone. Independent prior to admission but does not drive. One level apartment with elevator. Presented 07/16/2017 with right sided weakness and slurred speech. Cranial CT scan showed areas of acute to subacute ischemia within the centrum semiovale ovale on the left. No other focal abnormality noted. Patient did not receive TPA. MRI reviewed, showing left basal ganglia infarct. Per report, an acute early subacute infarction left posterior putamen, caudate body and corona radiata. MRA with no large vessel occlusion aneurysm or stenosis. CT angiogram of neck no extracranial flow reducing lesion or dissection identified. Lower extremity Dopplers negative for DVT. Echocardiogram with ejection fraction of 60% grade 1 diastolic dysfunction. Maintained on Plavix for CVA prophylaxis. Dysphagia #2 nectar thick liquid diet. Patient seen working with physical therapy with balance deficits and difficulties with motor planning. Physical and occupational therapy evaluations completed with recommendations of physical medicine rehabilitation consult.   Review of Systems  Constitutional: Negative for chills and fever.  HENT: Negative for hearing loss.   Eyes: Negative for blurred vision and double vision.  Respiratory: Negative for cough.        Some shortness of breath with exertion    Cardiovascular: Negative for chest pain and palpitations.  Gastrointestinal: Positive for constipation. Negative for nausea and vomiting.       GERD  Genitourinary: Negative for dysuria, flank pain and hematuria.  Musculoskeletal: Positive for joint pain and myalgias.  Skin: Negative for rash.  Neurological: Positive for dizziness, speech change and focal weakness. Negative for seizures.  All other systems reviewed and are negative.      Past Medical History:  Diagnosis Date  . Anemia   . Arthritis    "legs, knees" (01/07/2016)  . GERD (gastroesophageal reflux disease)    no meds  . High cholesterol   . History of blood transfusion   . Hypertension   . Seasonal allergies   . Shortness of breath dyspnea    if too active  . Syncope    passed out and was hospitalized for a couple of days  . Type II diabetes mellitus (HCC)    does not take medicine diet controlled        Past Surgical History:  Procedure Laterality Date  . BUNIONECTOMY Bilateral   . CATARACT EXTRACTION W/ INTRAOCULAR LENS  IMPLANT, BILATERAL Bilateral   . INGUINAL HERNIA REPAIR Right   . KNEE ARTHROSCOPY WITH EXCISION BAKER'S CYST Right   . MR LOWER LEG LEFT ([facility] HX) Left    after a break  . PARTIAL KNEE ARTHROPLASTY Right 01/06/2016   Procedure: RIGHT UNICOMPARTMENTAL KNEE ARTHROPLASTY;  Surgeon: Kathryne Hitch, MD;  Location: [facility] OR;  Service: Orthopedics;  Laterality: Right;  . VEIN LIGATION AND STRIPPING Bilateral         Family History  Problem Relation Age of Onset  . Stroke Mother   . CAD Father    Social History:  reports that she has  never smoked. She has never used smokeless tobacco. She reports that she does not drink alcohol or use drugs. Allergies:       Allergies  Allergen Reactions  . Oregano [Origanum Oil] Other (See Comments)    Congestion, stuffy nose  . Peanut-Containing Drug Products Other (See Comments)    Congestion, stuffy nose Can eat  cooked peanuts if pre-med with benadryl  . Tomato Other (See Comments)    Congestion, stuffy nose         Medications Prior to Admission  Medication Sig Dispense Refill  . aspirin EC 325 MG EC tablet Take 1 tablet (325 mg total) by mouth 2 (two) times daily after a meal. (Patient taking differently: Take 325 mg by mouth 2 (two) times daily as needed for pain. ) 30 tablet 0  . citalopram (CELEXA) 40 MG tablet Take 40 mg by mouth daily.  0  . diclofenac sodium (VOLTAREN) 1 % GEL Apply 2 g topically 4 (four) times daily. (Patient taking differently: Apply 1 application topically 4 (four) times daily as needed (pain). ) 3 Tube 3  . HORIZANT 600 MG TBCR take 1 tablet by mouth daily (Patient taking differently: take 1 tablet (600 mg) by mouth daily at bedtime) 60 tablet 3  . hyoscyamine (LEVSIN, ANASPAZ) 0.125 MG tablet Take 0.125 mg by mouth every 4 (four) hours as needed (abdominal pain).   0  . losartan (COZAAR) 100 MG tablet Take 100 mg by mouth daily.  0  . metoprolol succinate (TOPROL-XL) 25 MG 24 hr tablet Take 25 mg by mouth daily.    . solifenacin (VESICARE) 10 MG tablet Take 10 mg by mouth daily as needed (frequent urination).     Marland Kitchen HORIZANT 600 MG TBCR take 1 tablet by mouth daily (Patient not taking: Reported on 07/16/2017) 60 tablet 3  . methocarbamol (ROBAXIN) 500 MG tablet TAKE 1 TABLET BY MOUTH EVERY 6 TO 8 HOURS IF NEEDED FOR SPASMS OR PAIN (Patient not taking: Reported on 07/16/2017) 60 tablet 0  . oxyCODONE (OXY IR/ROXICODONE) 5 MG immediate release tablet Take 1-2 tablets (5-10 mg total) by mouth every 4 (four) hours as needed for severe pain or breakthrough pain. (Patient not taking: Reported on 07/16/2017) 60 tablet 0    Home: Home Living Family/patient expects to be discharged to:: Private residence Living Arrangements: Alone Available Help at Discharge: Family, Available PRN/intermittently Type of Home: Independent living facility (apartment) Home Access:  Elevator Home Layout: One level Bathroom Shower/Tub: Health visitor: Standard Home Equipment: Information systems manager, Bedside commode, Grab bars - tub/shower  Functional History: Prior Function Level of Independence: Independent Comments: Pt does not drive, but does own grocery shopping, including carrying groceries to her apt  Functional Status:  Mobility: Bed Mobility Overal bed mobility: Needs Assistance Bed Mobility: Sit to Supine Sit to supine: Min assist General bed mobility comments: assist for Rt LE  Transfers Overall transfer level: Needs assistance Equipment used: Rolling walker (2 wheeled) Transfers: Sit to/from Stand Sit to Stand: Min guard General transfer comment: min guard to steady  Ambulation/Gait Ambulation/Gait assistance: Min guard Ambulation Distance (Feet): 80 Feet Assistive device: Rolling walker (2 wheeled) (briefly used initially) Gait Pattern/deviations: Step-to pattern, Decreased stride length, Decreased dorsiflexion - right, Drifts right/left General Gait Details: ambulation around the room with increased difficulty. Patient running into doors, counter, needing cues for attention to R side. Pt demonstrates instability with walking needing min guard assistance for stability. R LE weakness evident and demonstrates decreased R DF  and decreased ability to advance R LE forward properly. unsteady cadence and gait speed changes noted with head turns and obstacles in the hallway Gait velocity: decreased Gait velocity interpretation: Below normal speed for age/gender  ADL: ADL Overall ADL's : Needs assistance/impaired Eating/Feeding: NPO Grooming: Wash/dry hands, Wash/dry face, Oral care, Minimal assistance, Standing Upper Body Bathing: Minimal assistance, Sitting Lower Body Bathing: Moderate assistance, Sit to/from stand Upper Body Dressing : Moderate assistance, Sitting Lower Body Dressing: Moderate assistance, Sit to/from stand Lower Body Dressing  Details (indicate cue type and reason): pt requires assist to pull socks over feet, due to Rt UE weakness.  She does attempt to use Rt UE to assist  Toilet Transfer: Minimal assistance, Ambulation, Comfort height toilet, Grab bars Toileting- Clothing Manipulation and Hygiene: Moderate assistance, Sit to/from stand Toileting - Clothing Manipulation Details (indicate cue type and reason): assist to pull pants over Rt hip  Functional mobility during ADLs: Minimal assistance  Cognition: Cognition Overall Cognitive Status: Impaired/Different from baseline Orientation Level: Oriented X4 Cognition Arousal/Alertness: Awake/alert Behavior During Therapy: WFL for tasks assessed/performed Overall Cognitive Status: Impaired/Different from baseline Area of Impairment: Following commands, Safety/judgement, Awareness, Problem solving, Attention Current Attention Level: Selective (with min cues ) Following Commands: Follows one step commands consistently, Follows multi-step commands inconsistently Safety/Judgement: Decreased awareness of safety, Decreased awareness of deficits Awareness: Intellectual Problem Solving: Slow processing, Requires verbal cues, Requires tactile cues, Difficulty sequencing General Comments: Pt initially states she doesn't know why she is in hospital, but later states, "they said I had a stroke"   Blood pressure (!) 165/65, pulse 67, temperature 98.6 F (37 C), temperature source Oral, resp. rate 18, height 5\' 7"  (1.702 m), weight 88 kg (194 lb 0.1 oz), SpO2 100 %. Physical Exam  Vitals reviewed. Constitutional: She appears well-developed and well-nourished.  HENT:  Head: Normocephalic and atraumatic.  Eyes: EOM are normal. Right eye exhibits no discharge. Left eye exhibits no discharge.  Pupils reactive to light  Neck: Normal range of motion. Neck supple. No thyromegaly present.  Cardiovascular: Normal rate, regular rhythm and normal heart sounds.   Respiratory: Effort  normal and breath sounds normal. No respiratory distress.  GI: Soft. Bowel sounds are normal. She exhibits no distension.  Musculoskeletal: She exhibits no edema or tenderness.  Neurological: She is alert.  Makes eye contact with examiner.  Follows simple commands.  She needed some cues for her appropriate age and date of birth as well as the present year. Motor: LUE/LLE: 5/5 proximal to distal RUE: Shoulder abduction 2+/5 proximal to distal, elbow flex/ext 2/5, distally 2-/5 RLE: 4-/5 proximal to distal Sensation intact to light touch +Dysarthria  Skin: Skin is warm and dry.  Psychiatric: She has a normal mood and affect. Her behavior is normal.      Assessment/Plan: Diagnosis: Left basal ganglia infarct Labs and images independently reviewed.  Records reviewed and summated above. Stroke: Continue secondary stroke prophylaxis and Risk Factor Modification listed below:   Antiplatelet therapy:   Blood Pressure Management:  Continue current medication with prn's with permisive HTN per primary team Statin Agent:   Right sided hemiparesis: fit for orthosis to prevent contractures (resting hand splint for day, wrist cock up splint at night, etc) Motor recovery: Fluoxetine  1. Does the need for close, 24 hr/day medical supervision in concert with the patient's rehab needs make it unreasonable for this patient to be served in a less intensive setting? Yes 2. Co-Morbidities requiring supervision/potential complications: HTN (monitor and provide prns in accordance  with increased physical exertion and pain), hyperlipidemia (cont meds), diabetes mellitus (Monitor in accordance with exercise and adjust meds as necessary), diastolic dysfunction (monitor for signs/symptoms of fluid overload), post-stroke dysphagia (advance diet as tolerated), hypokalemia (continue to monitor and replete as necessary), ABLA (transfuse if necessary to ensure appropriate perfusion for increased activity  tolerance) 3. Due to safety, disease management, medication administration and patient education, does the patient require 24 hr/day rehab nursing? Yes 4. Does the patient require coordinated care of a physician, rehab nurse, PT (1-2 hrs/day, 5 days/week), OT (1-2 hrs/day, 5 days/week) and SLP (1-2 hrs/day, 5 days/week) to address physical and functional deficits in the context of the above medical diagnosis(es)? Yes Addressing deficits in the following areas: balance, endurance, locomotion, strength, transferring, bathing, dressing, grooming, toileting, speech, swallowing and psychosocial support 5. Can the patient actively participate in an intensive therapy program of at least 3 hrs of therapy per day at least 5 days per week? Yes 6. The potential for patient to make measurable gains while on inpatient rehab is excellent 7. Anticipated functional outcomes upon discharge from inpatient rehab are modified independent and supervision  with PT, modified independent and supervision with OT, modified independent with SLP. 8. Estimated rehab length of stay to reach the above functional goals is: 7-11 days. 9. Anticipated D/C setting: Home 10. Anticipated post D/C treatments: HH therapy and Home excercise program 11. Overall Rehab/Functional Prognosis: good  RECOMMENDATIONS: This patient's condition is appropriate for continued rehabilitative care in the following setting: CIR after completion of medical workup Patient has agreed to participate in recommended program. Yes Note that insurance prior authorization may be required for reimbursement for recommended care.  Comment: Rehab Admissions Coordinator to follow up.  Maryla MorrowAnkit Patel, MD, ABPMR Charlton AmorANGIULLI,DANIEL J., PA-C 07/18/2017    Revision History                        Routing History

## 2017-07-19 NOTE — Evaluation (Signed)
Speech Language Pathology Assessment and Plan  Patient Details  Name: Kristi David MRN: [id number] Date of Birth: 06-27-1941  SLP Diagnosis: Dysarthria;Cognitive Impairments;Dysphagia  Rehab Potential: Good ELOS: 12-14 days    Today's Date: 07/19/2017 SLP Individual Time: 0803-0900 SLP Individual Time Calculation (min): 57 min   Problem List:  Patient Active Problem List   Diagnosis Date Noted  . Left basal ganglia embolic stroke ([place]) 08/67/6195  . Facial droop   . Right sided weakness   . Diabetes mellitus ([place])   . Diastolic dysfunction   . Dysphagia, post-stroke   . Dysarthria, post-stroke   . Hypokalemia   . Acute blood loss anemia   . Hyperlipidemia   . Stroke ([place]) 07/16/2017  . Hypertension 07/16/2017  . Arthritis of right hand 01/11/2017  . Osteoarthritis of right knee 01/06/2016  . Status post right partial knee replacement 01/06/2016   Past Medical History:  Past Medical History:  Diagnosis Date  . Anemia   . Arthritis    "legs, knees" (01/07/2016)  . GERD (gastroesophageal reflux disease)    no meds  . High cholesterol   . History of blood transfusion   . Hypertension   . Seasonal allergies   . Shortness of breath dyspnea    if too active  . Syncope    passed out and was hospitalized for a couple of days  . Type II diabetes mellitus (HCC)    does not take medicine diet controlled   Past Surgical History:  Past Surgical History:  Procedure Laterality Date  . BUNIONECTOMY Bilateral   . CATARACT EXTRACTION W/ INTRAOCULAR LENS  IMPLANT, BILATERAL Bilateral   . INGUINAL HERNIA REPAIR Right   . KNEE ARTHROSCOPY WITH EXCISION BAKER'S CYST Right   . MR LOWER LEG LEFT ([facility] HX) Left    after a break  . PARTIAL KNEE ARTHROPLASTY Right 01/06/2016   Procedure: RIGHT UNICOMPARTMENTAL KNEE ARTHROPLASTY;  Surgeon: Mcarthur Rossetti, MD;  Location: [facility];  Service: Orthopedics;  Laterality: Right;  . VEIN LIGATION AND STRIPPING Bilateral      Assessment / Plan / Recommendation Clinical Impression Tahirih Dorseyis a 76 y.o.right handed femalewith history of hypertension, hyperlipidemia, diabetes mellitus. Per chart review, patient, and son, patient lives alone. Independent prior to admission but does not drive. One level apartment with elevator. Presented 07/16/2017 with right sided weakness and slurred speech. Cranial CT scan showed areas of acute to subacute ischemia within the centrum semiovale ovale on the left. No other focal abnormality noted. Patient did not receive TPA. MRI reviewed, showing left basal ganglia infarct. Per report, an acute early subacute infarction left posterior putamen, caudate body and corona radiata. MRA with no large vessel occlusion aneurysm or stenosis. CT angiogram of neck no extracranial flow reducing lesion or dissection identified. Lower extremity Dopplers negative for DVT. Echocardiogram with ejection fraction of 09% grade 1 diastolic dysfunction. Maintained on Plavix for CVA prophylaxis. Dysphagia #2 nectar thick liquid diet. Patient seen working with physical therapy with balance deficits and difficulties with motor planning. Physical and occupational therapy evaluations completed with recommendations of physical medicine rehabilitation consult. Patient was admitted for a comprehensive rehabilitation program  Patient transferred to CIR on 07/18/2017 .   Pt was evaluated on 07/19/2017 for cognitive - linguistic and swallow skills. Pt presents with moderate swallow impairment with overall impairment in oral motor movements with right side weakness/ROM, oral control and pharyngeal residue leading to risk of aspiration and recommended diet of nectar thick liquids and dys 2 textured  foods full supervision to utilize chin tuck with liquids, which is further supported by MBS results on 07/17/2017. Pt demonstrated within functional limits during PO consumption of ice chips, immediate cough following 70% of trials  with thin liquids via TSP, immediate throat clear with with thin via cup sips, Min delayed throat clear with nectar thick liquids utilized as liquid wash with dys 3 and mild oral residue with consumption of dsy 3. Pt presents with moderate dysarthria speech at phrase and sentence level impacting intelligibility. Pt presents with moderate impairment in cognitive skills in recall of new information, basic problem solving and word finding skills, which is further supported by score of 12/30 on MOCA version 7/1 (n=>26.) Pt would benefit from skilled ST services to maximize functional independence and reduce burden of care.   Skilled Therapeutic Interventions          Skilled ST services focused on swallow skills.  Pt stated that " it is ok to drink thin water sometimes" SLP facilitated recall of swallow compensatory strategies utilizing visual aid and reviewed MBS results with pt. SLP provided mod-min A visual and verbal cues to utilzie chin tuck with each PO consumption of NTL. Pt was left in chair with call bell within reach. Recommend to continue ST services.    SLP Assessment  Patient will need skilled Speech Lanaguage Pathology Services during CIR admission    Recommendations  SLP Diet Recommendations: Dysphagia 2 (Fine chop);Nectar Liquid Administration via: Cup Medication Administration: Crushed with puree Supervision: Patient able to self feed Compensations: Slow rate;Small sips/bites;Minimize environmental distractions;Lingual sweep for clearance of pocketing;Chin tuck Postural Changes and/or Swallow Maneuvers: Seated upright 90 degrees;Upright 30-60 min after meal;Chin tuck Oral Care Recommendations: Oral care BID Patient destination: Assisted Living Follow up Recommendations: 24 hour supervision/assistance Equipment Recommended: None recommended by SLP    SLP Frequency 3 to 5 out of 7 days   SLP Duration  SLP Intensity  SLP Treatment/Interventions 12-14 days  Minumum of 1-2 x/day, 30  to 90 minutes  Cognitive remediation/compensation;Cueing hierarchy;Functional tasks;Dysphagia/aspiration precaution training    Pain Pain Assessment Pain Assessment: No/denies pain  Prior Functioning Cognitive/Linguistic Baseline: Within functional limits Type of Home: Independent living facility  Lives With: Alone Available Help at Discharge: Family;Available PRN/intermittently Vocation: Retired  Function:  Eating Eating   Modified Consistency Diet: Yes Eating Assist Level: Supervision or verbal cues;Helper checks for pocketed food           Cognition Comprehension Comprehension assist level: Follows basic conversation/direction with no assist  Expression   Expression assist level: Expresses basic 90% of the time/requires cueing < 10% of the time.  Social Interaction Social Interaction assist level: Interacts appropriately 90% of the time - Needs monitoring or encouragement for participation or interaction.  Problem Solving Problem solving assist level: Solves basic 25 - 49% of the time - needs direction more than half the time to initiate, plan or complete simple activities  Memory Memory assist level: Recognizes or recalls 25 - 49% of the time/requires cueing 50 - 75% of the time   Short Term Goals: Week 1: SLP Short Term Goal 1 (Week 1): Pt will consume dys 2 and nectar thick liquids with Min overt s/s aspiration and Min A verbal cues for use of swallowing compensatory strategies. SLP Short Term Goal 2 (Week 1): Pt will tolerate trials of thin liquids with overt s/s aspiration in order to assess readiness for instrumental swallow study. SLP Short Term Goal 3 (Week 1): Pt will tolerate trials  of dys 3 with Min overt s/s aspritation and Min A verbal cues to utilize swallow compensatory strategies to clear oral residue. SLP Short Term Goal 4 (Week 1): Pt will demonstarte functional probelm solving for basic tasks with Mod A verbal cues. SLP Short Term Goal 5 (Week 1): Pt will  recall daily infromation with Mod A verbal cues for use of external aids. SLP Short Term Goal 6 (Week 1): Pt will utilize speech intellgibility strategies at the sentence level with Mod A verbal cues to achieve 70% intellgibility.   Refer to Care Plan for Long Term Goals  Recommendations for other services: None   Discharge Criteria: Patient will be discharged from SLP if patient refuses treatment 3 consecutive times without medical reason, if treatment goals not met, if there is a change in medical status, if patient makes no progress towards goals or if patient is discharged from hospital.  The above assessment, treatment plan, treatment alternatives and goals were discussed and mutually agreed upon: by patient  Areatha Kalata  [facility] 07/19/2017, 5:28 PM

## 2017-07-19 NOTE — Progress Notes (Signed)
Trish Mage, RN Rehab Admission Coordinator Signed Physical Medicine and Rehabilitation  PMR Pre-admission Date of Service: 07/18/2017 3:09 PM  Related encounter: ED to Hosp-Admission (Discharged) from 07/16/2017 in [facility]       [] Hide copied text PMR Admission Coordinator Pre-Admission Assessment  Patient: Kristi David is an 76 y.o., female MRN: [id number] DOB: 03-08-1941 Height: 5\' 7"  (170.2 cm) Weight: 88 kg (194 lb 0.1 oz)                                                                                                                                                  Insurance Information HMO: No   PPO:       PCP:       IPA:       80/20:       OTHER:   PRIMARY:  Medicare A/B      Policy#: [id number]      Subscriber: [name] CM Name:        Phone#:       Fax#:   Pre-Cert#:        Employer: Retired Benefits:  Phone #:       Name: Checked in Engineer, [employer] Eff. Date: 09/28/03     Deduct: $1340      Out of Pocket Max: None      Life Max: N/A CIR: 100%      SNF: 100 days Outpatient: 80%     Co-Pay: 20% Home Health: 100%      Co-Pay: none DME: 80%     Co-Pay: 20% Providers: patient's choice  SECONDARY: [employer]      Policy#: [id number]      Subscriber:  [name] CM Name:        Phone#:       Fax#:   Pre-Cert#:        Employer: Retired Benefits:  Phone #: [phone]     Name:   Eff. Date:       Deduct:        Out of Pocket Max:        Life Max:   CIR:        SNF:   Outpatient:       Co-Pay:   Home Health:        Co-Pay:   DME:       Co-Pay:    Emergency Contact Information        Contact Information    Name Relation Home Work Mobile   [place] Son   [phone]     Current Medical History  Patient Admitting Diagnosis:  L BG infarct  History of Present Illness: A 76 y.o.right handed femalewith history of hypertension, hyperlipidemia, diabetes mellitus. Per chart review, patient, and son, patient lives alone.  Independent prior to admission but does not drive. One level  apartment with elevator. Presented 07/16/2017 with right sided weakness and slurred speech. Cranial CT scan showed areas of acute to subacute ischemia within the centrum semiovale ovale on the left. No other focal abnormality noted. Patient did not receive TPA. MRI reviewed, showing left basal ganglia infarct. Per report, an acute early subacute infarction left posterior putamen, caudate body and corona radiata. MRA with no large vessel occlusion aneurysm or stenosis. CT angiogram of neck no extracranial flow reducing lesion or dissection identified. Lower extremity Dopplers negative for DVT. Echocardiogram with ejection fraction of 60% grade 1 diastolic dysfunction. Maintained on Plavix for CVA prophylaxis. Dysphagia #2 nectar thick liquid diet. Patient seen working with physical therapy with balance deficits and difficulties with motor planning. Physical and occupational therapy evaluations completed with recommendations of physical medicine rehabilitation consult.   Total: 7=NIH  Past Medical History      Past Medical History:  Diagnosis Date  . Anemia   . Arthritis    "legs, knees" (01/07/2016)  . GERD (gastroesophageal reflux disease)    no meds  . High cholesterol   . History of blood transfusion   . Hypertension   . Seasonal allergies   . Shortness of breath dyspnea    if too active  . Syncope    passed out and was hospitalized for a couple of days  . Type II diabetes mellitus (HCC)    does not take medicine diet controlled    Family History  family history includes CAD in her father; Stroke in her mother.  Prior Rehab/Hospitalizations: Had [facility] and outpatient therapy after knee surgery in 2017.  Has the patient had major surgery during 100 days prior to admission? No  Current Medications   Current Facility-Administered Medications:  .   stroke: mapping our early stages of recovery book, , Does  not apply, Once, Pearson Grippe, MD .  acetaminophen (TYLENOL) tablet 650 mg, 650 mg, Oral, Q4H PRN, 650 mg at 07/17/17 2039 **OR** acetaminophen (TYLENOL) solution 650 mg, 650 mg, Per Tube, Q4H PRN **OR** acetaminophen (TYLENOL) suppository 650 mg, 650 mg, Rectal, Q4H PRN, Pearson Grippe, MD .  atorvastatin (LIPITOR) tablet 80 mg, 80 mg, Oral, q1800, Pearson Grippe, MD, 80 mg at 07/17/17 1802 .  clopidogrel (PLAVIX) tablet 75 mg, 75 mg, Oral, Daily, Marvel Plan, MD, 75 mg at 07/18/17 1022 .  diclofenac sodium (VOLTAREN) 1 % transdermal gel 2 g, 2 g, Topical, QID PRN, Pearson Grippe, MD .  insulin aspart (novoLOG) injection 0-9 Units, 0-9 Units, Subcutaneous, TID WC, Pearson Grippe, MD .  losartan (COZAAR) tablet 100 mg, 100 mg, Oral, Daily, Philip Aspen, Limmie Patricia, MD, 100 mg at 07/18/17 1023 .  metoprolol succinate (TOPROL-XL) 24 hr tablet 25 mg, 25 mg, Oral, Daily, Philip Aspen, Limmie Patricia, MD, 25 mg at 07/18/17 1022  Patients Current Diet: DIET DYS 2 Room service appropriate? Yes; Fluid consistency: Nectar Thick  Precautions / Restrictions Precautions Precautions: Fall Restrictions Weight Bearing Restrictions: No   Has the patient had 2 or more falls or a fall with injury in the past year?No  Prior Activity Level Limited Community (1-2x/wk): Went out about 3 X a week, does not drive.  Home Assistive Devices / Equipment Home Assistive Devices/Equipment: Jeannette How (specify type) Home Equipment: Shower seat, Bedside commode, Grab bars - tub/shower  Prior Device Use: Indicate devices/aids used by the patient prior to current illness, exacerbation or injury? None  Prior Functional Level Prior Function Level of Independence: Independent Comments: Pt does not drive, but  does own grocery shopping, including carrying groceries to her apt   Self Care: Did the patient need help bathing, dressing, using the toilet or eating?  Independent  Indoor Mobility: Did the patient need  assistance with walking from room to room (with or without device)? Independent  Stairs: Did the patient need assistance with internal or external stairs (with or without device)? Independent  Functional Cognition: Did the patient need help planning regular tasks such as shopping or remembering to take medications? Independent  Current Functional Level Cognition  Arousal/Alertness: Awake/alert Overall Cognitive Status: Impaired/Different from baseline Current Attention Level: Selective Orientation Level: Oriented X4 Following Commands: Follows one step commands consistently, Follows multi-step commands inconsistently Safety/Judgement: Decreased awareness of safety, Decreased awareness of deficits General Comments: Pt initially states she doesn't know why she is in hospital, but later states, "they said I had a stroke"  Attention: Sustained Sustained Attention: Appears intact Memory: Appears intact Awareness: Impaired Awareness Impairment: Emergent impairment Problem Solving: Impaired Problem Solving Impairment: Functional complex Executive Function: Self Correcting, Self Monitoring, Reasoning Reasoning: Impaired Reasoning Impairment: Functional basic Self Monitoring: Impaired Self Monitoring Impairment: Functional basic Self Correcting: Impaired Self Correcting Impairment: Functional complex Safety/Judgment: Impaired    Extremity Assessment (includes Sensation/Coordination)  Upper Extremity Assessment: RUE deficits/detail RUE Deficits / Details: Pt with Rt UE movment in [place] stage 3, beginning stage 4, and hand stage 5 (unable to fully flex digits).  She uses Rt UE as a gross assist during ADL activities   Lower Extremity Assessment: Defer to PT evaluation RLE Deficits / Details: strength 4-/5. sensation intact to LT  LLE Deficits / Details: sensation intact to LT; strength 4-/5    ADLs  Overall ADL's : Needs assistance/impaired Eating/Feeding: NPO Grooming:  Wash/dry hands, Wash/dry face, Oral care, Minimal assistance, Standing Upper Body Bathing: Minimal assistance, Sitting Lower Body Bathing: Moderate assistance, Sit to/from stand Upper Body Dressing : Moderate assistance, Sitting Lower Body Dressing: Moderate assistance, Sit to/from stand Lower Body Dressing Details (indicate cue type and reason): pt requires assist to pull socks over feet, due to Rt UE weakness.  She does attempt to use Rt UE to assist  Toilet Transfer: Minimal assistance, Ambulation, Comfort height toilet, Grab bars Toileting- Clothing Manipulation and Hygiene: Moderate assistance, Sit to/from stand Toileting - Clothing Manipulation Details (indicate cue type and reason): assist to pull pants over Rt hip  Functional mobility during ADLs: Minimal assistance    Mobility  Overal bed mobility: Needs Assistance Bed Mobility: Sit to Supine, Supine to Sit Supine to sit: Min assist Sit to supine: Min guard General bed mobility comments: Min A for trunk elevation and for RLE lift assist for return to supine.     Transfers  Overall transfer level: Needs assistance Equipment used: Rolling walker (2 wheeled) Transfers: Sit to/from Stand Sit to Stand: Min guard General transfer comment: Min guard for steadying assist. Verbal cues for safe hand placement. Increased time and effort required to stand.     Ambulation / Gait / Stairs / Wheelchair Mobility  Ambulation/Gait Ambulation/Gait assistance: Min assist, Hydrographic surveyorMin guard Ambulation Distance (Feet): 25 Feet Assistive device: Rolling walker (2 wheeled) Gait Pattern/deviations: Step-to pattern, Decreased stride length, Decreased dorsiflexion - right, Drifts right/left General Gait Details: Gait within the room this session. Worked on visual scanning to the R during ambulation to increase awareness to R side, however, pt continues to run into obstacles on the R. Required cues for R step height and foot clearance secondary to  decreased DF. During gait,  IR MD entered room and pt's food delivered, so ambulation distance shortened this session.  Gait velocity: Decreased Gait velocity interpretation: Below normal speed for age/gender    Posture / Balance Dynamic Sitting Balance Sitting balance - Comments: able to don/doff socks without LOB  Balance Overall balance assessment: Needs assistance Sitting-balance support: Feet supported, No upper extremity supported Sitting balance-Leahy Scale: Good Sitting balance - Comments: able to don/doff socks without LOB  Standing balance support: During functional activity, No upper extremity supported Standing balance-Leahy Scale: Poor Standing balance comment: Reliant on UE support for balance     Special needs/care consideration BiPAP/CPAP No CPM No Continuous Drip IV No Dialysis No      Life Vest No Oxygen No Special Bed No Trach Size No Wound Vac (area) No   Skin No                           Bowel mgmt: Last BM 07/17/17 Bladder mgmt: Voiding in bathroom with assist Diabetic mgmt Yes, diet to control DM    Previous Home Environment Living Arrangements: Alone  Lives With: Alone Available Help at Discharge: Family, Available PRN/intermittently Type of Home: Independent living facility Care Facility Name:  (independent living) Home Layout: One level Home Access: Engineer, maintenance (IT) Shower/Tub: Health visitor: Standard Home Care Services: No  Discharge Living Setting Plans for Discharge Living Setting: Apartment, Other (Comment) ([place], has Engineer, structural.) Type of Home at Discharge: Apartment Discharge Home Layout: One level (On the second level, has elevator.) Discharge Home Access: Level entry Does the patient have any problems obtaining your medications?: No  Social/Family/Support Systems Patient Roles: Parent (Has 6 children.) Contact Information: Darnelle Derrick - son - [phone] Anticipated  Caregiver: Self and son Ability/Limitations of Caregiver: One son local, 2 sons in [place], Dtr in [place], Dtr in [place], one son Engineer, site Caregiver Availability: Intermittent (Son can stop in to check on patient.) Discharge Plan Discussed with Primary Caregiver: Yes Is Caregiver In Agreement with Plan?: Yes Does Caregiver/Family have Issues with Lodging/Transportation while Pt is in Rehab?: No  Goals/Additional Needs Patient/Family Goal for Rehab: PT/OT mod I and supervision, SLP mod I goals Expected length of stay: 7-11 days Cultural Considerations: Attends [employer] Dietary Needs: Dys 2, nectar thick liquids Equipment Needs: TBD Pt/Family Agrees to Admission and willing to participate: Yes Program Orientation Provided & Reviewed with Pt/Caregiver Including Roles  & Responsibilities: Yes  Decrease burden of Care through IP rehab admission: N/A  Possible need for SNF placement upon discharge: Not planned  Patient Condition: This patient's condition remains as documented in the consult dated 07/18/17, in which the Rehabilitation Physician determined and documented that the patient's condition is appropriate for intensive rehabilitative care in an inpatient rehabilitation facility. Will admit to inpatient rehab today.  Preadmission Screen Completed By:  Trish Mage, 07/18/2017 3:17 PM ______________________________________________________________________   Discussed status with Dr. Allena Katz on 07/18/17 at 1517 and received telephone approval for admission today.  Admission Coordinator:  Trish Mage, time 1517/Date 07/18/17       Cosigned by: Marcello Fennel, MD at 07/18/2017 3:47 PM  Revision History

## 2017-07-19 NOTE — Evaluation (Signed)
Occupational Therapy Assessment and Plan  Patient Details  Name: Kristi David MRN: [id number] Date of Birth: [birth year]  OT Diagnosis: cognitive deficits and hemiplegia affecting dominant side Rehab Potential: Rehab Potential (ACUTE ONLY): Excellent ELOS: 12-14 days   Today's Date: 07/19/2017 OT Individual Time: 0930-1030 OT Individual Time Calculation (min): 60 min     Problem List:  Patient Active Problem List   Diagnosis Date Noted  . Left basal ganglia embolic stroke ([place]) 02/77/4128  . Facial droop   . Right sided weakness   . Diabetes mellitus ([place])   . Diastolic dysfunction   . Dysphagia, post-stroke   . Dysarthria, post-stroke   . Hypokalemia   . Acute blood loss anemia   . Hyperlipidemia   . Stroke ([place]) 07/16/2017  . Hypertension 07/16/2017  . Arthritis of right hand 01/11/2017  . Osteoarthritis of right knee 01/06/2016  . Status post right partial knee replacement 01/06/2016    Past Medical History:  Past Medical History:  Diagnosis Date  . Anemia   . Arthritis    "legs, knees" (01/07/2016)  . GERD (gastroesophageal reflux disease)    no meds  . High cholesterol   . History of blood transfusion   . Hypertension   . Seasonal allergies   . Shortness of breath dyspnea    if too active  . Syncope    passed out and was hospitalized for a couple of days  . Type II diabetes mellitus ([facility])    does not take medicine diet controlled   Past Surgical History:  Past Surgical History:  Procedure Laterality Date  . BUNIONECTOMY Bilateral   . CATARACT EXTRACTION W/ INTRAOCULAR LENS  IMPLANT, BILATERAL Bilateral   . INGUINAL HERNIA REPAIR Right   . KNEE ARTHROSCOPY WITH EXCISION BAKER'S CYST Right   . MR LOWER LEG LEFT ([facility]) Left    after a break  . PARTIAL KNEE ARTHROPLASTY Right 01/06/2016   Procedure: RIGHT UNICOMPARTMENTAL KNEE ARTHROPLASTY;  Surgeon: Mcarthur Rossetti, MD;  Location: [facility];  Service: Orthopedics;  Laterality: Right;  . VEIN  LIGATION AND STRIPPING Bilateral     Assessment & Plan Clinical Impression: Kristi David is a 76 y.o. right handed female with history of hypertension, hyperlipidemia, diabetes mellitus. Per chart review, patient, and son, patient lives alone. Independent prior to admission but does not drive. One level apartment with elevator. Presented 07/16/2017 with right sided weakness and slurred speech. Cranial CT scan showed areas of acute to subacute ischemia within the centrum semiovale ovale on the left. No other focal abnormality noted. Patient did not receive TPA. MRI reviewed, showing left basal ganglia infarct. Per report, an acute early subacute infarction left posterior putamen, caudate body and corona radiata. MRA with no large vessel occlusion aneurysm or stenosis. CT angiogram of neck no extracranial flow reducing lesion or dissection identified. Lower extremity Dopplers negative for DVT. Echocardiogram with ejection fraction of 78% grade 1 diastolic dysfunction. Maintained on Plavix for CVA prophylaxis. Dysphagia #2 nectar thick liquid diet. Patient seen working with physical therapy with balance deficits and difficulties with motor planning. Physical and occupational therapy evaluations completed with recommendations of physical medicine rehabilitation consult. Patient was admitted for a comprehensive rehabilitation program.  Patient transferred to CIR on 07/18/2017 .    Patient currently requires min with basic self-care skills secondary to decreased cardiorespiratoy endurance, abnormal tone, unbalanced muscle activation and decreased coordination, decreased attention to right, decreased memory and decreased standing balance, decreased postural control and hemiplegia.  Prior to  hospitalization, patient was fully independent and living alone.     Patient will benefit from skilled intervention to increase independence with basic self-care skills prior to discharge home with care partner.  Anticipate  patient will require intermittent supervision and follow up home health.  OT - End of Session Activity Tolerance: Tolerates 10 - 20 min activity with multiple rests OT Assessment Rehab Potential (ACUTE ONLY): Excellent OT Patient demonstrates impairments in the following area(s): Balance;Cognition;Endurance;Motor;Sensory;Perception OT Basic ADL's Functional Problem(s): Eating;Grooming;Bathing;Dressing;Toileting OT Advanced ADL's Functional Problem(s): Simple Meal Preparation OT Transfers Functional Problem(s): Toilet;Tub/Shower OT Additional Impairment(s): Fuctional Use of Upper Extremity OT Plan OT Intensity: Minimum of 1-2 x/day, 45 to 90 minutes OT Frequency: 5 out of 7 days OT Duration/Estimated Length of Stay: 12-14 days OT Treatment/Interventions: Balance/vestibular training;Cognitive remediation/compensation;Discharge planning;DME/adaptive equipment instruction;Functional mobility training;Neuromuscular re-education;Patient/family education;Psychosocial support;Self Care/advanced ADL retraining;Therapeutic Activities;Therapeutic Exercise;UE/LE Strength taining/ROM;UE/LE Coordination activities;Visual/perceptual remediation/compensation OT Self Feeding Anticipated Outcome(s): Independent OT Basic Self-Care Anticipated Outcome(s): Supervision OT Toileting Anticipated Outcome(s): Supervision OT Bathroom Transfers Anticipated Outcome(s): Supervision OT Recommendation Patient destination: Home Follow Up Recommendations: Home health OT Equipment Recommended: Tub/shower seat   Skilled Therapeutic Intervention Pt seen for initial evaluation and ADL training. Explained role of OT and discussed goals.  Pt has some word finding difficulties but is able to follow conversation and answer questions well.  Pt participated well demonstrating fair activity tolerance.  Pt ambulated to bathroom with HHA and completed toileting, shower and dressing with steadying A for balance and min A to compensate  for non functional RUE.  Pt actively elevated R shoulder 20-30 degrees to wash under her R arm.  Pt completed all tasks and was resting in w/c in room with all needs met.   OT Evaluation Precautions/Restrictions  Precautions Precautions: Fall Pain Pain Assessment Pain Assessment: No/denies pain Home Living/Prior Functioning Home Living Family/patient expects to be discharged to:: Private residence Available Help at Discharge: Family, Available PRN/intermittently Type of Home: Independent living facility Home Access: Elevator Home Layout: One level Bathroom Shower/Tub: Walk-in shower  Lives With: Alone Prior Function Level of Independence: Independent with basic ADLs, Independent with homemaking with ambulation, Independent with gait, Independent with transfers  Able to Take Stairs?: Yes Driving: No Vocation: Retired Leisure: Hobbies-yes (Comment) Comments: Reports she likes "going out to the stores"; does her own grocery shopping per chart review ADL ADL ADL Comments: refer to functional navigator Vision Baseline Vision/History: Wears glasses Wears Glasses: Reading only Patient Visual Report: Blurring of vision (in L eye) Eye Alignment: Within Functional Limits (when pt is looking up, R eye moves medially) Ocular Range of Motion: Within Functional Limits Alignment/Gaze Preference: Within Defined Limits Tracking/Visual Pursuits: Decreased smoothness of horizontal tracking;Unable to hold eye position out of midline;Requires cues, head turns, or add eye shifts to track;Decreased smoothness of vertical tracking Visual Fields: No apparent deficits Perception  Comments: mild R inattention in ambulation, not seen with self care skills Praxis Praxis: Intact Cognition Overall Cognitive Status: Impaired/Different from baseline Arousal/Alertness: Awake/alert Orientation Level: Person;Place;Situation Person: Oriented Place: Oriented Situation: Oriented Year: 2018 Month:  October Day of Week: Correct Memory: Appears intact Immediate Memory Recall: Sock;Blue;Bed Memory Recall: Sock;Blue;Bed Memory Recall Sock: With Cue Memory Recall Blue: With Cue Memory Recall Bed: With Cue Problem Solving: Impaired Problem Solving Impairment: Functional complex Sensation Sensation Light Touch: Impaired by gross assessment Stereognosis: Appears Intact Hot/Cold: Appears Intact Proprioception: Impaired by gross assessment Coordination Gross Motor Movements are Fluid and Coordinated: No Fine Motor Movements are Fluid and Coordinated: No Finger  Nose Finger Test: no active movement on R hand, slight movement of R sh in abd Motor  Motor Motor: Hemiplegia Motor - Skilled Clinical Observations: R hemiparesis UE>LE Mobility    refer to functional navigator Trunk/Postural Assessment  Cervical Assessment Cervical Assessment: Within Functional Limits Thoracic Assessment Thoracic Assessment: Within Functional Limits Lumbar Assessment Lumbar Assessment: Exceptions to [facility] Postural Control Postural Control: Deficits on evaluation  Balance Balance Balance Assessed: Yes Standardized Balance Assessment Standardized Balance Assessment: Berg Balance Test Berg Balance Test Sit to Stand: Able to stand  independently using hands Standing Unsupported: Able to stand 2 minutes with supervision Sitting with Back Unsupported but Feet Supported on Floor or Stool: Able to sit safely and securely 2 minutes Stand to Sit: Sits safely with minimal use of hands Transfers: Able to transfer with verbal cueing and /or supervision Standing Unsupported with Eyes Closed: Able to stand 10 seconds with supervision Standing Ubsupported with Feet Together: Able to place feet together independently and stand for 1 minute with supervision From Standing, Reach Forward with Outstretched Arm: Can reach forward >12 cm safely (5") From Standing Position, Pick up Object from Floor: Able to pick up shoe,  needs supervision From Standing Position, Turn to Look Behind Over each Shoulder: Looks behind one side only/other side shows less weight shift Turn 360 Degrees: Needs assistance while turning Standing Unsupported, Alternately Place Feet on Step/Stool: Needs assistance to keep from falling or unable to try Standing Unsupported, One Foot in Front: Able to take small step independently and hold 30 seconds Standing on One Leg: Tries to lift leg/unable to hold 3 seconds but remains standing independently Total Score: 34 Static Sitting Balance Static Sitting - Level of Assistance: 6: Modified independent (Device/Increase time) Dynamic Sitting Balance Dynamic Sitting - Level of Assistance: 5: Stand by assistance Static Standing Balance Static Standing - Level of Assistance: 5: Stand by assistance Dynamic Standing Balance Dynamic Standing - Level of Assistance: 4: Min assist Extremity/Trunk Assessment RUE Assessment RUE Assessment: Exceptions to [facility] (pt can elevate shoulder in abd to 30 degrees, no other AROM, tone WFL) LUE Assessment LUE Assessment: Within Functional Limits   See Function Navigator for Current Functional Status.   Refer to Care Plan for Long Term Goals  Recommendations for other services: None    Discharge Criteria: Patient will be discharged from OT if patient refuses treatment 3 consecutive times without medical reason, if treatment goals not met, if there is a change in medical status, if patient makes no progress towards goals or if patient is discharged from hospital.  The above assessment, treatment plan, treatment alternatives and goals were discussed and mutually agreed upon: by patient  [place] 07/19/2017, 1:00 PM

## 2017-07-19 NOTE — Progress Notes (Signed)
Physical Therapy Session Note  Patient Details  Name: Kristi David MRN: [id number] Date of Birth: 08/27/1941  Today's Date: 07/19/2017 PT Individual Time: 1031-1100 PT Individual Time Calculation (min): 29 min   Short Term Goals: Week 1:  PT Short Term Goal 1 (Week 1): Pt will perform bed mobility modI PT Short Term Goal 2 (Week 1): Pt will perform transfers with consistent S PT Short Term Goal 3 (Week 1): Pt will ambulate 150' with no AD and S PT Short Term Goal 4 (Week 1): Pt will ascend/descend 12 stairs with min guard  Skilled Therapeutic Interventions/Progress Updates:  Pt sitting in w/c in room upon PT arrival, agreeable to therapy tx and denies pain this session. Berg balance test completed this session, results as detailed below. Therapist discussed meaning of the results with the patient. Pt performed 1 x 10 sit to stands without UE support and min assist, focus on symmetric weightbearing for LE strengthening and L LE NMR. Pt left seated in w/c at end of session with needs in reach.    Therapy Documentation Precautions:  Precautions Precautions: Fall Restrictions Weight Bearing Restrictions: No   Balance: Balance Balance Assessed: Yes Standardized Balance Assessment Standardized Balance Assessment: Berg Balance Test Berg Balance Test Sit to Stand: Able to stand  independently using hands Standing Unsupported: Able to stand 2 minutes with supervision Sitting with Back Unsupported but Feet Supported on Floor or Stool: Able to sit safely and securely 2 minutes Stand to Sit: Sits safely with minimal use of hands Transfers: Able to transfer with verbal cueing and /or supervision Standing Unsupported with Eyes Closed: Able to stand 10 seconds with supervision Standing Ubsupported with Feet Together: Able to place feet together independently and stand for 1 minute with supervision From Standing, Reach Forward with Outstretched Arm: Can reach forward >12 cm safely (5") From  Standing Position, Pick up Object from Floor: Able to pick up shoe, needs supervision From Standing Position, Turn to Look Behind Over each Shoulder: Looks behind one side only/other side shows less weight shift Turn 360 Degrees: Needs assistance while turning Standing Unsupported, Alternately Place Feet on Step/Stool: Needs assistance to keep from falling or unable to try Standing Unsupported, One Foot in Front: Able to take small step independently and hold 30 seconds Standing on One Leg: Tries to lift leg/unable to hold 3 seconds but remains standing independently Total Score: 34 Timed Up and Go Test TUG: Normal TUG Normal TUG (seconds): 23 Static Sitting Balance Static Sitting - Balance Support: Feet supported;No upper extremity supported Static Sitting - Level of Assistance: 6: Modified independent (Device/Increase time) Dynamic Sitting Balance Dynamic Sitting - Balance Support: Feet supported;No upper extremity supported Dynamic Sitting - Level of Assistance: 5: Stand by assistance Dynamic Sitting - Balance Activities: Lateral lean/weight shifting;Forward lean/weight shifting;Reaching for objects Static Standing Balance Static Standing - Balance Support: During functional activity;No upper extremity supported Static Standing - Level of Assistance: 5: Stand by assistance Dynamic Standing Balance Dynamic Standing - Balance Support: During functional activity;No upper extremity supported Dynamic Standing - Level of Assistance: 4: Min assist Dynamic Standing - Balance Activities: Lateral lean/weight shifting;Forward lean/weight shifting;Reaching for objects   See Function Navigator for Current Functional Status.   Therapy/Group: Individual Therapy  Cresenciano GenreEmily van Schagen, PT, DPT 07/19/2017, 10:51 AM

## 2017-07-20 ENCOUNTER — Inpatient Hospital Stay (HOSPITAL_COMMUNITY): Payer: Medicare Other

## 2017-07-20 ENCOUNTER — Inpatient Hospital Stay (HOSPITAL_COMMUNITY): Payer: Medicare Other | Admitting: Speech Pathology

## 2017-07-20 ENCOUNTER — Inpatient Hospital Stay (HOSPITAL_COMMUNITY): Payer: Medicare Other | Admitting: Physical Therapy

## 2017-07-20 DIAGNOSIS — I10 Essential (primary) hypertension: Secondary | ICD-10-CM

## 2017-07-20 LAB — GLUCOSE, CAPILLARY
GLUCOSE-CAPILLARY: 113 mg/dL — AB (ref 65–99)
GLUCOSE-CAPILLARY: 89 mg/dL (ref 65–99)
GLUCOSE-CAPILLARY: 106 mg/dL — AB (ref 65–99)
Glucose-Capillary: 92 mg/dL (ref 65–99)

## 2017-07-20 NOTE — Progress Notes (Signed)
Occupational Therapy Session Note  Patient Details  Name: Kristi David MRN: [id number] Date of Birth: 02/11/1941  Today's Date: 07/20/2017 OT Individual Time: 0900-1000 OT Individual Time Calculation (min): 60 min    Short Term Goals: Week 1:  OT Short Term Goal 1 (Week 1): pt will bathe with S. OT Short Term Goal 2 (Week 1): Pt will actively use RUE as a stabilizing A during dressing tasks.  OT Short Term Goal 3 (Week 1): Pt will don shirt with S. OT Short Term Goal 4 (Week 1): Pt will don pants with S.  Skilled Therapeutic Interventions/Progress Updates:    Pt resting in bed upon arrival.  Focus on BADL retraining, functional amb without AD, functional transfers, and increased functional use of RUE during functional tasks.  Pt amb without AD (steady A) to closet to gather clothing prior to entering bathroom to use toilet and bathe at shower level.  Pt initiated self HOH assist to use RUE to bathe RLE.  Pt with trance R shoulder movement but unable to use RUE functionally during BADLs.  Pt recalled hemi dressing tasks and threaded RUE into shirt sleeve before requiring assistance donning over head.  Pt threaded BLE into underpants and pants and required assistance only with pulling up pants over R hip.  Pt returned to bed and remained in bed with all needs within reach.   Therapy Documentation Precautions:  Precautions Precautions: Fall Restrictions Weight Bearing Restrictions: No  Pain:  Pt denies pain  See Function Navigator for Current Functional Status.   Therapy/Group: Individual Therapy  Rich BraveLanier, Gracyn Santillanes [place] 07/20/2017, 10:05 AM

## 2017-07-20 NOTE — Progress Notes (Signed)
Patient had requested Advanced Directives.  Brought form to patient and explained.  She is not ready to complete at this time. Told her to have nurse call chaplain when she is ready to complete the form. (Weekdays  Up to 3 in afternoons)  Phebe CollaDonna S Basir Niven, Chaplain   07/20/17 1500  Clinical Encounter Type  Visited With Patient  Visit Type Initial  Referral From Nurse  Spiritual Encounters  Spiritual Needs Prayer  Stress Factors  Patient Stress Factors None identified  Family Stress Factors None identified

## 2017-07-20 NOTE — Progress Notes (Signed)
Speech Language Pathology Daily Session Note  Patient Details  Name: Kristi David MRN: [id number] Date of Birth: 07/05/1941  Today's Date: 07/20/2017 SLP Individual Time: 0730-0800 SLP Individual Time Calculation (min): 30 min  Short Term Goals: Week 1: SLP Short Term Goal 1 (Week 1): Pt will consume dys 2 and nectar thick liquids with Min overt s/s aspiration and Min A verbal cues for use of swallowing compensatory strategies. SLP Short Term Goal 2 (Week 1): Pt will tolerate trials of thin liquids with overt s/s aspiration in order to assess readiness for instrumental swallow study. SLP Short Term Goal 3 (Week 1): Pt will tolerate trials of dys 3 with Min overt s/s aspritation and Min A verbal cues to utilize swallow compensatory strategies to clear oral residue. SLP Short Term Goal 4 (Week 1): Pt will demonstarte functional probelm solving for basic tasks with Mod A verbal cues. SLP Short Term Goal 5 (Week 1): Pt will recall daily infromation with Mod A verbal cues for use of external aids. SLP Short Term Goal 6 (Week 1): Pt will utilize speech intellgibility strategies at the sentence level with Mod A verbal cues to achieve 70% intellgibility.   Skilled Therapeutic Interventions:  Upon arrival, patient declined breakfast due to not having an appetite yet, therefore, skilled treatment session focused on cognitive goals. SLP facilitated session by providing Min A verbal cues for problem solving during a basic money management task. SLP also provided Mod A verbal cues for patient to utilize speech intelligibility strategies at the sentence level to achieve ~50-75% intelligibility. Patient left upright in bed with alarm on and all needs within reach. Continue with current plan of care.      Function:   Cognition Comprehension Comprehension assist level: Follows basic conversation/direction with no assist  Expression   Expression assist level: Expresses basic 50 - 74% of the time/requires  cueing 25 - 49% of the time. Needs to repeat parts of sentences.  Social Interaction Social Interaction assist level: Interacts appropriately 90% of the time - Needs monitoring or encouragement for participation or interaction.  Problem Solving Problem solving assist level: Solves basic 50 - 74% of the time/requires cueing 25 - 49% of the time  Memory Memory assist level: Recognizes or recalls 50 - 74% of the time/requires cueing 25 - 49% of the time    Pain No/Denies Pain   Therapy/Group: Individual Therapy  [name] 07/20/2017, 9:40 AM

## 2017-07-20 NOTE — Patient Care Conference (Signed)
Inpatient RehabilitationTeam Conference and Plan of Care Update Date: 07/19/2017   Time: 2:05 PM    Patient Name: Kristi David      Medical Record Number: [id number]  Date of Birth: 09-22-1941 Sex: Female         Room/Bed: 4W13C/4W13C-01 Payor Info: Payor: MEDICARE / Plan: MEDICARE PART A AND B / Product Type: *No Product type* /    Admitting Diagnosis: L CVA  Admit Date/Time:  07/18/2017  4:46 PM Admission Comments: No comment available   Primary Diagnosis:  <principal problem not specified> Principal Problem: <principal problem not specified>  Patient Active Problem List   Diagnosis Date Noted  . Left basal ganglia embolic stroke (HCC) 07/18/2017  . Facial droop   . Right sided weakness   . Diabetes mellitus (HCC)   . Diastolic dysfunction   . Dysphagia, post-stroke   . Dysarthria, post-stroke   . Hypokalemia   . Acute blood loss anemia   . Hyperlipidemia   . Stroke (HCC) 07/16/2017  . Hypertension 07/16/2017  . Arthritis of right hand 01/11/2017  . Osteoarthritis of right knee 01/06/2016  . Status post right partial knee replacement 01/06/2016    Expected Discharge Date: Expected Discharge Date: 07/30/17  Team Members Present: Physician leading conference: Dr. Faith David Social Worker Present: Kristi Jupiter, LCSW Nurse Present: Kristi Portela, RN PT Present: Kristi David, PT OT Present: Kristi David, OT SLP Present: Kristi David, SLP PPS Coordinator present : Kristi Duck, RN, CRRN     Current Status/Progress Goal Weekly Team Focus  Medical   left basal ganglia infarct with right hemiparesis and dysphagia/dysarthria, HTN  improve functional use of right side  swallowing, nutrition, HTN,    Bowel/Bladder   Continent of bowel/bladder. 07/17/17  Remain continent of bowel/bladder while in [facility]  Monitor bowel/bladder q shift and as needed   Swallow/Nutrition/ Hydration   Eval Pending         ADL's   min A overall  Supervision overall  RUE NMR, balance,  ADL training, pt/family education   Mobility   Eval Pending         Communication   Eval Pending          Safety/Cognition/ Behavioral Observations  Eval Pending          Pain   No complain of pain   <2  Assess and treat q shift and as needed   Skin   No skin issue noted.  Skin to be free from breakdown/infection while in [facility]  Assess skin q shift and as needed    Rehab Goals Patient on target to meet rehab goals: Yes Rehab Goals Revised: none - pt's first conference *See Care Plan and progress notes for long and short-term goals.     Barriers to Discharge  Current Status/Progress Possible Resolutions Date Resolved   Physician    Medical stability        BP control, mgt of stroke sequelae      Nursing                  PT  Decreased caregiver support;Lack of/limited family support  pt reports no family member able to provide 24/7              OT                  SLP                SW  Discharge Planning/Teaching Needs:  Pt lives in independent living apartment and team is recommending supervision overall.  CSW will have to work with family and pt to determine what their d/c plan will be.  Family education to be offered closer to pt's d/c.   Team Discussion:  Pt is just being evaluated today.  She has left sided weakness, is dysarthric, D2 nectar thick liquids (family needs education).  Pt is continent of bowel and bladder.  Pt is min A with PT without assistive device.  Pt has some right inattention.  Pt is min A with OT.  Pt with supervision goals overall.  Revisions to Treatment Plan:  None - day of evaluations    Continued Need for Acute Rehabilitation Level of Care: The patient requires daily medical management by a physician with specialized training in physical medicine and rehabilitation for the following conditions: Daily direction of a multidisciplinary physical rehabilitation program to ensure safe treatment while eliciting the highest outcome that  is of practical value to the patient.: Yes Daily medical management of patient stability for increased activity during participation in an intensive rehabilitation regime.: Yes Daily analysis of laboratory values and/or radiology reports with any subsequent need for medication adjustment of medical intervention for : Neurological problems;Blood pressure problems  Kristi David, Kristi David 07/20/2017, 9:44 AM

## 2017-07-20 NOTE — Progress Notes (Signed)
Speech Language Pathology Daily Session Note  Patient Details  Name: Kristi David MRN: [id number] Date of Birth: 03/09/1941  Today's Date: 07/20/2017 SLP Individual Time: 1445-1530 SLP Individual Time Calculation (min): 45 min  Short Term Goals: Week 1: SLP Short Term Goal 1 (Week 1): Pt will consume dys 2 and nectar thick liquids with Min overt s/s aspiration and Min A verbal cues for use of swallowing compensatory strategies. SLP Short Term Goal 2 (Week 1): Pt will tolerate trials of thin liquids with overt s/s aspiration in order to assess readiness for instrumental swallow study. SLP Short Term Goal 3 (Week 1): Pt will tolerate trials of dys 3 with Min overt s/s aspritation and Min A verbal cues to utilize swallow compensatory strategies to clear oral residue. SLP Short Term Goal 4 (Week 1): Pt will demonstarte functional probelm solving for basic tasks with Mod A verbal cues. SLP Short Term Goal 5 (Week 1): Pt will recall daily infromation with Mod A verbal cues for use of external aids. SLP Short Term Goal 6 (Week 1): Pt will utilize speech intellgibility strategies at the sentence level with Mod A verbal cues to achieve 70% intellgibility.   Skilled Therapeutic Interventions: Skilled treatment session focused on dysphagia and cognition goals. SLP facilitated session by providing skilled observation of pt consuming thin water per water protocol. Pt with mild throat clear x 1 throughout 6 oz consumption. Recommend allowing water protocol. SLP further facilitated session by providing Max A to recall information from today with use of external aids. Pt was left in bed, bed alarm on and all needs within reach. Continue per current plan of care.      Function:  Eating Eating   Modified Consistency Diet: No (Water trials with SLP) Eating Assist Level: Supervision or verbal cues           Cognition Comprehension Comprehension assist level: Follows basic conversation/direction with no  assist  Expression   Expression assist level: Expresses basic 50 - 74% of the time/requires cueing 25 - 49% of the time. Needs to repeat parts of sentences.  Social Interaction Social Interaction assist level: Interacts appropriately 90% of the time - Needs monitoring or encouragement for participation or interaction.  Problem Solving Problem solving assist level: Solves basic 50 - 74% of the time/requires cueing 25 - 49% of the time  Memory Memory assist level: Recognizes or recalls 50 - 74% of the time/requires cueing 25 - 49% of the time    Pain    Therapy/Group: Individual Therapy  Nidia Grogan 07/20/2017, 3:35 PM

## 2017-07-20 NOTE — Progress Notes (Signed)
Physical Therapy Session Note  Patient Details  Name: Kristi David [place] MRN: [id number] Date of Birth: 11/20/1940  Today's Date: 07/20/2017 PT Individual Time: 1100-1200 PT Individual Time Calculation (min): 60 min   Short Term Goals: Week 1:  PT Short Term Goal 1 (Week 1): Pt will perform bed mobility modI PT Short Term Goal 2 (Week 1): Pt will perform transfers with consistent S PT Short Term Goal 3 (Week 1): Pt will ambulate 150' with no AD and S PT Short Term Goal 4 (Week 1): Pt will ascend/descend 12 stairs with min guard  Skilled Therapeutic Interventions/Progress Updates: Pt received seated in bed, denies pain and agreeable to treatment. Bed mobility with S and increased time. Pt dons shoes with setupA to retrieve and increased time. Gait in/out of bathroom with minA HHA; pt performs clothing management and hygiene with S. Gait to gym x175' with L HHA and min guard. Lateral step ups BLE on 6" step for strengthening and NMR 2x20 reps each; min cues for technique and manual facilitation for R glute activation in stance. Side stepping R/L 2x10' with min guard, cues for increased R step length, tactile cueing for R glute activation. Standing toe taps to 4" step with min>maxA d/t LOB to R side; poor weight shift to L in order to clear R foot however improved with repetition. Progressive balance exercises including normal BOS eyes open/closed, narrow BOS eyes open/closed, staggered stance eyes open all performed with min guard x30 sec. Attempted tandem stance; unable to perform d/t major LOB to R side with each attempt. Returned to room with min guard gait; remained seated in w/c at end of session, all needs in reach.      Therapy Documentation Precautions:  Precautions Precautions: Fall Restrictions Weight Bearing Restrictions: No   See Function Navigator for Current Functional Status.   Therapy/Group: Individual Therapy  Vista Lawmanlizabeth J Tygielski 07/20/2017, 12:03 PM

## 2017-07-20 NOTE — Progress Notes (Signed)
[place] PHYSICAL MEDICINE & REHABILITATION     PROGRESS NOTE    Subjective/Complaints: Up with SLP. No new complaints. Feels well  ROS: pt denies nausea, vomiting, diarrhea, cough, shortness of breath or chest pain   Objective: Vital Signs: Blood pressure (!) 178/69, pulse 72, temperature 98.7 F (37.1 C), temperature source Oral, resp. rate 18, height 5\' 7"  (1.702 m), SpO2 98 %. No results found.  Recent Labs  07/18/17 0228 07/19/17 0856  WBC 4.4 4.7  HGB 11.3* 12.2  HCT 33.6* 37.2  PLT 164 222    Recent Labs  07/18/17 0228 07/19/17 0856  NA 139 140  K 3.4* 3.5  CL 108 109  GLUCOSE 94 126*  BUN 10 11  CREATININE 0.82 0.87  CALCIUM 8.7* 9.1   CBG (last 3)   Recent Labs  07/19/17 1638 07/19/17 2124 07/20/17 0627  GLUCAP 89 104* 92    Wt Readings from Last 3 Encounters:  07/17/17 88 kg (194 lb 0.1 oz)  01/06/16 87.1 kg (192 lb)  12/31/15 87.2 kg (192 lb 3.9 oz)    Physical Exam:  Constitutional: She appears well-developed and well-nourished.  HENT:  Head: Normocephalic and atraumatic.  Eyes: EOM are normal. Right eye exhibits no discharge. Left eye exhibits no discharge.  Neck: Normal range of motion. Neck supple. No thyromegaly present.  Cardiovascular: RRR without murmur. No JVD .   Respiratory: CTA Bilaterally without wheezes or rales. Normal effort   GI: Soft. Bowel sounds are normal. She exhibits no distension.  Musculoskeletal: She exhibits no edema or tenderness.  Neurological: She is alert.   Speech dysarthric, right central 7 and tongue deviation Motor: LUE/LLE: 5/5 proximal to distal RUE: Shoulder abduction 2 to 2+/5 proximal to distal, elbow flex/ext 2/5, distally 1-2/5---no change today RLE: 4-/5 HF, KE and 4/5 ADF/PF.  Sensation intact to light touch on right.    Skin: Skin is warm and dry.  Psychiatric: She has a normal mood and affect. Her behavior is normal.   Assessment/Plan: 1. Right hemiparesis and functional deficits  secondary to right hemiparesis which require 3+ hours per day of interdisciplinary therapy in a comprehensive inpatient rehab setting. Physiatrist is providing close team supervision and 24 hour management of active medical problems listed below. Physiatrist and rehab team continue to assess barriers to discharge/monitor patient progress toward functional and medical goals.  Function:  Bathing Bathing position   Position: Shower  Bathing parts Body parts bathed by patient: Chest, Abdomen, Front perineal area, Buttocks, Right upper leg, Left upper leg, Right arm Body parts bathed by helper: Left arm, Right lower leg, Left lower leg, Back  Bathing assist Assist Level: Touching or steadying assistance(Pt > 75%)      Upper Body Dressing/Undressing Upper body dressing   What is the patient wearing?: Pull over shirt/dress     Pull over shirt/dress - Perfomed by patient: Thread/unthread left sleeve, Pull shirt over trunk Pull over shirt/dress - Perfomed by helper: Put head through opening, Thread/unthread right sleeve        Upper body assist        Lower Body Dressing/Undressing Lower body dressing   What is the patient wearing?: Underwear, Pants, Shoes Underwear - Performed by patient: Thread/unthread right underwear leg, Thread/unthread left underwear leg, Pull underwear up/down   Pants- Performed by patient: Thread/unthread right pants leg, Thread/unthread left pants leg Pants- Performed by helper: Pull pants up/down           Shoes - Performed by helper:  Don/doff right shoe, Don/doff left shoe          Lower body assist Assist for lower body dressing: Touching or steadying assistance (Pt > 75%)      Toileting Toileting   Toileting steps completed by patient: Adjust clothing prior to toileting, Performs perineal hygiene, Adjust clothing after toileting Toileting steps completed by helper: Adjust clothing prior to toileting, Adjust clothing after toileting Toileting  Assistive Devices: Grab bar or rail  Toileting assist Assist level: Touching or steadying assistance (Pt.75%)   Transfers Chair/bed transfer   Chair/bed transfer method: Stand pivot Chair/bed transfer assist level: Touching or steadying assistance (Pt > 75%) Chair/bed transfer assistive device: Walker, Designer, fashion/clothingArmrests     Locomotion Ambulation     Max distance: 175 Assist level: Touching or steadying assistance (Pt > 75%)   Wheelchair   Type: Manual Max wheelchair distance: 75 Assist Level: Moderate assistance (Pt 50 - 74%)  Cognition Comprehension Comprehension assist level: Follows basic conversation/direction with no assist  Expression Expression assist level: Expresses basic 90% of the time/requires cueing < 10% of the time.  Social Interaction Social Interaction assist level: Interacts appropriately 90% of the time - Needs monitoring or encouragement for participation or interaction.  Problem Solving Problem solving assist level: Solves basic 25 - 49% of the time - needs direction more than half the time to initiate, plan or complete simple activities  Memory Memory assist level: Recognizes or recalls 25 - 49% of the time/requires cueing 50 - 75% of the time   Medical Problem List and Plan: 1.  Right sided weakness and dysarthria secondary to left basal ganglia infarction secondary to small vessel disease  -continue therapies. Appears motivated 2.  DVT Prophylaxis/Anticoagulation: SCDs. Monitor for any signs of DVT 3. Pain Management: Voltaren gel as needed 4. Mood: Celexa 40 mg daily 5. Neuropsych: This patient is capable of making decisions on her own behalf. 6. Skin/Wound Care: Routine skin checks 7. Fluids/Electrolytes/Nutrition: encourage PO  -replacing potassium 8. Dysphagia. Dysphagia #2 nectar liquids. Advance per speech therapy 9. Hypertension. Cozaar 100 mg daily, Toprol 25 mg daily  -fair control at present 10. Hyperlipidemia. Lipitor 11. Diabetes mellitus with  peripheral neuropathy. Hemoglobin A1c 5.5. Patient diet controlled prior to admission.  -reasonable control at present   LOS (Days) 2 A FACE TO FACE EVALUATION WAS PERFORMED  Ranelle OysterSWARTZ,Diedra Sinor T, MD 07/20/2017 9:10 AM

## 2017-07-21 ENCOUNTER — Inpatient Hospital Stay (HOSPITAL_COMMUNITY): Payer: Medicare Other | Admitting: Occupational Therapy

## 2017-07-21 ENCOUNTER — Inpatient Hospital Stay (HOSPITAL_COMMUNITY): Payer: Medicare Other

## 2017-07-21 ENCOUNTER — Inpatient Hospital Stay (HOSPITAL_COMMUNITY): Payer: Medicare Other | Admitting: Physical Therapy

## 2017-07-21 ENCOUNTER — Inpatient Hospital Stay (HOSPITAL_COMMUNITY): Payer: Medicare Other | Admitting: Speech Pathology

## 2017-07-21 LAB — GLUCOSE, CAPILLARY
GLUCOSE-CAPILLARY: 110 mg/dL — AB (ref 65–99)
GLUCOSE-CAPILLARY: 105 mg/dL — AB (ref 65–99)
Glucose-Capillary: 93 mg/dL (ref 65–99)
Glucose-Capillary: 101 mg/dL — ABNORMAL HIGH (ref 65–99)

## 2017-07-21 NOTE — Progress Notes (Signed)
Physical Therapy Session Note  Patient Details  Name: Kristi David MRN: [id number] Date of Birth: 07/20/1941  Today's Date: 07/21/2017 PT Individual Time: 0700-0800 PT Individual Time Calculation (min): 60 min   Short Term Goals: Week 1:  PT Short Term Goal 1 (Week 1): Pt will perform bed mobility modI PT Short Term Goal 2 (Week 1): Pt will perform transfers with consistent S PT Short Term Goal 3 (Week 1): Pt will ambulate 150' with no AD and S PT Short Term Goal 4 (Week 1): Pt will ascend/descend 12 stairs with min guard  Skilled Therapeutic Interventions/Progress Updates: Pt received sleeping in bed, easily awoken and agreeable to treatment. Denies pain. Supine>sit with S. Non-skid socks donned totalA after several unsuccessful attempts. Gait min guard 3x125-150' throughout session, cues for longer R step length and improved foot clearance. Performed BUE/BLE nustep x8 min on level 4 with average 50 steps/min; utilized R hand splint to maintain grip on handle. Standing gastroc stretch on wedge x2 min. Heel raises 2x15. Mini-squats x10 on wedge for soleus stretch and LE strengthening. Standing hip abduction 1x15, 1x10 reps with BUE support. Retraining stepping strategies with lateral steps over trekking pole, min/modA d/t LOB to R side and poor RLE foot clearance and step length over pole, improved with repetition. Stepping to numbered targets arranged in half circle in front of pt; minA/min guard and improved RLE step length and coordination with repetition. Returned to room with gait as above; cued for increased gait speed. Remained seated in w/c at end of session, all needs in reach.      Therapy Documentation Precautions:  Precautions Precautions: Fall Restrictions Weight Bearing Restrictions: No   See Function Navigator for Current Functional Status.   Therapy/Group: Individual Therapy  Kristi David 07/21/2017, 7:55 AM

## 2017-07-21 NOTE — IPOC Note (Signed)
Overall Plan of Care [facility](IPOC) Patient Details Name: Jossie NgHettie Loretto MRN: [id number] DOB: 06/08/1941  Admitting Diagnosis: <principal problem not specified>  Hospital Problems: Active Problems:   Left basal ganglia embolic stroke [facility](HCC)     Functional Problem List: Nursing Behavior, Endurance, Medication Management, Motor, Safety  PT Balance, Endurance, Motor, Perception, Safety, Sensory, Skin Integrity  OT Balance, Cognition, Endurance, Motor, Sensory, Perception  SLP    TR         Basic ADL's: OT Eating, Grooming, Bathing, Dressing, Toileting     Advanced  ADL's: OT Simple Meal Preparation     Transfers: PT Bed Mobility, Bed to Chair, Car, Occupational psychologisturniture  OT Toilet, Research scientist (life sciences)Tub/Shower     Locomotion: PT Ambulation, Stairs     Additional Impairments: OT Fuctional Use of Upper Extremity  SLP Swallowing      TR      Anticipated Outcomes Item Anticipated Outcome  Self Feeding Independent  Swallowing  Supervision   Basic self-care  Supervision  Toileting  Supervision   Bathroom Transfers Supervision  Bowel/Bladder  Remain continent of bowel and bladder. Mod I  Transfers  modI  Locomotion  S in home and Financial risk analystcommunity  Communication  Supervision  Cognition  Supervision  Pain  Less than 2  Safety/Judgment  Will remain free of falls, skin breakdown and infection while on rehab   Therapy Plan: PT Intensity: Minimum of 1-2 x/day ,45 to 90 minutes PT Frequency: 5 out of 7 days PT Duration Estimated Length of Stay: 12-14 days  OT Intensity: Minimum of 1-2 x/day, 45 to 90 minutes OT Frequency: 5 out of 7 days OT Duration/Estimated Length of Stay: 12-14 days SLP Intensity: Minumum of 1-2 x/day, 30 to 90 minutes SLP Frequency: 3 to 5 out of 7 days SLP Duration/Estimated Length of Stay: 12-14 days    Team Interventions: Nursing Interventions Patient/Family Education, Disease Management/Prevention, Medication Management, Dysphagia/Aspiration Precaution Training, Discharge  Planning  PT interventions Ambulation/gait training, Discharge planning, Functional mobility training, Psychosocial support, Therapeutic Activities, Visual/perceptual remediation/compensation, Therapeutic Exercise, Neuromuscular re-education, Disease management/prevention, Balance/vestibular training, Skin care/wound management, Cognitive remediation/compensation, DME/adaptive equipment instruction, Splinting/orthotics, UE/LE Strength taining/ROM, UE/LE Coordination activities, Stair training, Patient/family education, Functional electrical stimulation, Community reintegration  OT Interventions Warden/rangerBalance/vestibular training, Cognitive remediation/compensation, Discharge planning, DME/adaptive equipment instruction, Functional mobility training, Neuromuscular re-education, Patient/family education, Psychosocial support, Self Care/advanced ADL retraining, Therapeutic Activities, Therapeutic Exercise, UE/LE Strength taining/ROM, UE/LE Coordination activities, Visual/perceptual remediation/compensation  SLP Interventions Cognitive remediation/compensation, Cueing hierarchy, Functional tasks, Dysphagia/aspiration precaution training  TR Interventions    SW/CM Interventions Discharge Planning, Psychosocial Support, Patient/Family Education   Barriers to Discharge MD  Medical stability  Nursing      PT Decreased caregiver support, Lack of/limited family support pt reports no family member able to provide 24/7  OT      SLP      SW       Team Discharge Planning: Destination: PT-Home ,OT- Home , SLP-Assisted Living Projected Follow-up: PT-Home health PT, OT-  Home health OT, SLP-24 hour supervision/assistance Projected Equipment Needs: PT-None recommended by PT, OT- Tub/shower seat, SLP-None recommended by SLP Equipment Details: PT- , OT-  Patient/family involved in discharge planning: PT- Patient,  OT-Patient, SLP-Patient  MD ELOS: 12-14 days Medical Rehab Prognosis:  Excellent Assessment: The  patient has been admitted for CIR therapies with the diagnosis of left basal ganglia infarct. The team will be addressing functional mobility, strength, stamina, balance, safety, adaptive techniques and equipment, self-care, bowel and bladder mgt, patient and caregiver education, NMR, orthotics,  communication and cognition. Goals have been set at supervision for mobility, self-care, communication and cognition.    Ranelle Oyster, MD, FAAPMR      See Team Conference Notes for weekly updates to the plan of care

## 2017-07-21 NOTE — Progress Notes (Signed)
Occupational Therapy Note  Patient Details  Name: Kristi David MRN: [id number] Date of Birth: 04/12/1941  Today's Date: 07/21/2017 OT Individual Time: 1300-1330 OT Individual Time Calculation (min): 30 min   No c/o pain.  Pt seen this session to facilitate RUE AROM with A/arom using UE Ranger, towel slides on tray table, and tapping to facilitate bicep.  Pt does have slight active sh abd against gravity, trace sh flexion and elb flexion in gravity eliminated position with tapping facilitation.   Practiced with return demonstration of self ROM exercises using clasped hands.  Pt's speech therapist arrived for her next session.   SAGUIER,JULIA 07/21/2017, 2:58 PM

## 2017-07-21 NOTE — Progress Notes (Signed)
Occupational Therapy Session Note  Patient Details  Name: Kristi David MRN: [id number] Date of Birth: 09/02/1941  Today's Date: 07/21/2017 OT Individual Time: [id number] OT Individual Time Calculation (min): 57 min    Short Term Goals: Week 1:  OT Short Term Goal 1 (Week 1): pt will bathe with S. OT Short Term Goal 2 (Week 1): Pt will actively use RUE as a stabilizing A during dressing tasks.  OT Short Term Goal 3 (Week 1): Pt will don shirt with S. OT Short Term Goal 4 (Week 1): Pt will don pants with S.  Skilled Therapeutic Interventions/Progress Updates:    Pt engaged in BADL retraining including bathing at shower level and dressing with sit<>stand from bench in bathroom.  Pt amb in room (steady A) without AD to gather clothing prior to entering bathroom. Pt received education on hemi bathing/dressing techniques and pt return demonstrated.  Pt attempts to raise RUE to facilitate bathing axillary but requires assistance to complete task.  Pt requires steady A when standing in shower for bathing and also when dressing.  Pt requires HOH A when using RUE in functional tasks. Pt requested to return to bed at end of session.  Pt remained in bed with all needs within reach.   Therapy Documentation Precautions:  Precautions Precautions: Fall Restrictions Weight Bearing Restrictions: No   Pain: Pain Assessment Pain Assessment: 0-10 Pain Score: 0-No pain  See Function Navigator for Current Functional Status.   Therapy/Group: Individual Therapy  Rich BraveLanier, Danon Lograsso Chappell 07/21/2017, 9:59 AM

## 2017-07-21 NOTE — Progress Notes (Signed)
Social Work Patient ID: Kristi David, female   DOB: 06/26/1941, 76 y.o.   MRN: [id number]   Kristi David, Darden DatesJennifer C, LCSW Social Worker Signed   Patient Care Conference Date of Service: 07/20/2017  9:40 AM      Hide copied text Hover for attribution information Inpatient RehabilitationTeam Conference and Plan of Care Update Date: 07/19/2017   Time: 2:05 PM      Patient Name: Kristi David      Medical Record Number: [id number]  Date of Birth: 05/04/1941 Sex: Female         Room/Bed: 4W13C/4W13C-01 Payor Info: Payor: MEDICARE / Plan: MEDICARE PART A AND B / Product Type: *No Product type* /     Admitting Diagnosis: L CVA  Admit Date/Time:  07/18/2017  4:46 PM Admission Comments: No comment available    Primary Diagnosis:  <principal problem not specified> Principal Problem: <principal problem not specified>       Patient Active Problem List    Diagnosis Date Noted  . Left basal ganglia embolic stroke (HCC) 07/18/2017  . Facial droop    . Right sided weakness    . Diabetes mellitus (HCC)    . Diastolic dysfunction    . Dysphagia, post-stroke    . Dysarthria, post-stroke    . Hypokalemia    . Acute blood loss anemia    . Hyperlipidemia    . Stroke (HCC) 07/16/2017  . Hypertension 07/16/2017  . Arthritis of right hand 01/11/2017  . Osteoarthritis of right knee 01/06/2016  . Status post right partial knee replacement 01/06/2016      Expected Discharge Date: Expected Discharge Date: 07/30/17   Team Members Present: Physician leading conference: Dr. Faith RogueZachary Swartz Social Worker Present: Kristi JupiterLucy Hoyle, LCSW Nurse Present: Kennon PortelaJeanna Hicks, RN PT Present: Alyson ReedyElizabeth Tygielski, PT OT Present: Roney MansJennifer Smith, OT SLP Present: Feliberto Gottronourtney Payne, SLP PPS Coordinator present : Tora DuckMarie Noel, RN, CRRN       Current Status/Progress Goal Weekly Team Focus  Medical     left basal ganglia infarct with right hemiparesis and dysphagia/dysarthria, HTN  improve functional use of right side  swallowing,  nutrition, HTN,    Bowel/Bladder     Continent of bowel/bladder. 07/17/17  [facility]ain [facility]  Monitor bowel/bladder q shift and as needed   Swallow/Nutrition/ Hydration     Eval Pending         ADL's     min A overall  Supervision overall  RUE NMR, balance, ADL training, pt/family education   Mobility     Eval Pending         Communication     Eval Pending          Safety/Cognition/ Behavioral Observations   Eval Pending          Pain     No complain of pain   <2  Assess and treat q shift and as needed   Skin     No skin issue noted.  Skin to be free [facility] breakdown[facility]  Assess skin q shift and as needed     Rehab Goals Patient on target to meet rehab goals: Yes Rehab Goals Revised: none - pt's first conference *See Care Plan and progress notes for long and short-term goals.      Barriers to Discharge   Current Status/Progress Possible Resolutions Date Resolved   Physician     Medical stability        BP control, mgt of stroke sequelae  Nursing                 PT  Decreased caregiver support;Lack of/limited family support  pt reports no family member able to provide 24/7              OT                 SLP            SW              Discharge Planning/Teaching Needs:  Pt lives in independent living apartment and team is recommending supervision overall.  CSW will have to work with family and pt to determine what their d/c plan will be.  Family education to be offered closer to pt's d/c.   Team Discussion:  Pt is just being evaluated today.  She has left sided weakness, is dysarthric, D2 nectar thick liquids (family needs education).  Pt is continent of bowel and bladder.  Pt is min A with PT without assistive device.  Pt has some right inattention.  Pt is min A with OT.  Pt with supervision goals overall.  Revisions to Treatment Plan:  None - day of evaluations    Continued Need for Acute Rehabilitation Level of  Care: The patient requires daily medical management by a physician with specialized training in physical medicine and rehabilitation for the following conditions: Daily direction of a multidisciplinary physical rehabilitation program to ensure safe treatment while eliciting the highest outcome that is of practical value to the patient.: Yes Daily medical management of patient stability for increased activity during participation in an intensive rehabilitation regime.: Yes Daily analysis of laboratory values and/or radiology reports with any subsequent need for medication adjustment of medical intervention for : Neurological problems;Blood pressure problems   Daniyah Fohl, [name] 07/20/2017, 9:44 AM

## 2017-07-21 NOTE — Progress Notes (Signed)
Speech Language Pathology Daily Session Note  Patient Details  Name: Kristi David MRN: [id number] Date of Birth: 12/17/1940  Today's Date: 07/21/2017 SLP Individual Time: 1330-1400 SLP Individual Time Calculation (min): 30 min  Short Term Goals: Week 1: SLP Short Term Goal 1 (Week 1): Pt will consume dys 2 and nectar thick liquids with Min overt s/s aspiration and Min A verbal cues for use of swallowing compensatory strategies. SLP Short Term Goal 2 (Week 1): Pt will tolerate trials of thin liquids with overt s/s aspiration in order to assess readiness for instrumental swallow study. SLP Short Term Goal 3 (Week 1): Pt will tolerate trials of dys 3 with Min overt s/s aspritation and Min A verbal cues to utilize swallow compensatory strategies to clear oral residue. SLP Short Term Goal 4 (Week 1): Pt will demonstarte functional probelm solving for basic tasks with Mod A verbal cues. SLP Short Term Goal 5 (Week 1): Pt will recall daily infromation with Mod A verbal cues for use of external aids. SLP Short Term Goal 6 (Week 1): Pt will utilize speech intellgibility strategies at the sentence level with Mod A verbal cues to achieve 70% intellgibility.   Skilled Therapeutic Interventions: Skilled treatment session focused on dysphagia and cognition goals. SLP facilitated session by providing skilled observation of pt consuming thin water according to water protocol. Pt with no overt s/s of aspiration for 8 oz. SLP further facilitated session by providing Min A to recall daily information and pt acknowledged that she could refer to schedule for therapists' names. Pt able to solve basic tasks related to navigating back to room with Min A. Pt left upright in wheelchair with all needs within reach. Continue per current plan of care.      Function:  Eating Eating   Modified Consistency Diet: No (Water protocol) Eating Assist Level: Supervision or verbal cues           Cognition Comprehension  Comprehension assist level: Follows basic conversation/direction with no assist  Expression   Expression assist level: Expresses basic needs/ideas: With no assist  Social Interaction Social Interaction assist level: Interacts appropriately 90% of the time - Needs monitoring or encouragement for participation or interaction.  Problem Solving Problem solving assist level: Solves basic problems with no assist  Memory Memory assist level: Recognizes or recalls 90% of the time/requires cueing < 10% of the time    Pain    Therapy/Group: Individual Therapy  Calel Pisarski 07/21/2017, 2:03 PM

## 2017-07-21 NOTE — Progress Notes (Signed)
Physical Therapy Session Note  Patient Details  Name: Kristi David MRN: [id number] Date of Birth: 04/12/1941  Today's Date: 07/21/2017 PT Individual Time: 1130-1158 PT Individual Time Calculation (min): 28 min   Short Term Goals: Week 1:  PT Short Term Goal 1 (Week 1): Pt will perform bed mobility modI PT Short Term Goal 2 (Week 1): Pt will perform transfers with consistent S PT Short Term Goal 3 (Week 1): Pt will ambulate 150' with no AD and S PT Short Term Goal 4 (Week 1): Pt will ascend/descend 12 stairs with min guard  Skilled Therapeutic Interventions/Progress Updates:    Functional transfers with overall min assist for balance during session for stand pivot. Intermittent cues for RUE management during mobility. NMR to address RUE with active assisted motion using UE ranger with support provided at elbow and cues for upright posture. Educated and pt return demonstrated self stretching and ROM techniques using LUE for active assisted movements.   Therapy Documentation Precautions:  Precautions Precautions: Fall Restrictions Weight Bearing Restrictions: No   Pain: Denies pain.  See Function Navigator for Current Functional Status.   Therapy/Group: Individual Therapy  Karolee StampsGray, Cammie Faulstich Darrol PokeBrescia  Uno Esau B. Uniqua Kihn, PT, DPT  07/21/2017, 12:04 PM

## 2017-07-21 NOTE — Progress Notes (Signed)
[facility] PHYSICAL MEDICINE & REHABILITATION     PROGRESS NOTE    Subjective/Complaints: Up in parallel bars with PT. No new complaints  ROS: pt denies nausea, vomiting, diarrhea, cough, shortness of breath or chest pain   Objective: Vital Signs: Blood pressure 110/70, pulse 81, temperature 98.1 F (36.7 C), temperature source Oral, resp. rate 18, height 5\' 7"  (1.702 m), SpO2 98 %. No results found.  Recent Labs  07/19/17 0856  WBC 4.7  HGB 12.2  HCT 37.2  PLT 222    Recent Labs  07/19/17 0856  NA 140  K 3.5  CL 109  GLUCOSE 126*  BUN 11  CREATININE 0.87  CALCIUM 9.1   CBG (last 3)   Recent Labs  07/20/17 1627 07/20/17 2047 07/21/17 0631  GLUCAP 89 106* 93    Wt Readings from Last 3 Encounters:  07/17/17 88 kg (194 lb 0.1 oz)  01/06/16 87.1 kg (192 lb)  12/31/15 87.2 kg (192 lb 3.9 oz)    Physical Exam:  Constitutional: She appears well-developed and well-nourished.  HENT:  Head: Normocephalic and atraumatic.  Eyes: EOM are normal. Right eye exhibits no discharge. Left eye exhibits no discharge.  Neck: Normal range of motion. Neck supple. No thyromegaly present.  Cardiovascular: RRR without murmur. No JVD  .   Respiratory:CTA Bilaterally without wheezes or rales. Normal effort  GI: BS +, non-tender, non-distended .  Musculoskeletal: She exhibits no edema or tenderness.  Neurological: She is alert.   Speech dysarthric, right central 7 and tongue deviation Motor: LUE/LLE: 5/5 proximal to distal RUE: Shoulder abduction   2+/5 proximal to distal, elbow flex/ext 2+/5, grip 2 to2+/5  RLE: 4-/5 HF, KE and 4/5 ADF/PF.  Sensation intact to light touch on right.    Skin: Skin is warm and dry.  Psychiatric: She has a normal mood and affect. Her behavior is normal.   Assessment/Plan: 1. Right hemiparesis and functional deficits secondary to right hemiparesis which require 3+ hours per day of interdisciplinary therapy in a comprehensive inpatient rehab  setting. Physiatrist is providing close team supervision and 24 hour management of active medical problems listed below. Physiatrist and rehab team continue to assess barriers to discharge/monitor patient progress toward functional and medical goals.  Function:  Bathing Bathing position   Position: Shower  Bathing parts Body parts bathed by patient: Right arm, Chest, Abdomen, Front perineal area, Buttocks, Right upper leg, Left upper leg Body parts bathed by helper: Left arm, Right lower leg, Left lower leg, Back  Bathing assist Assist Level: Touching or steadying assistance(Pt > 75%)      Upper Body Dressing/Undressing Upper body dressing   What is the patient wearing?: Pull over shirt/dress     Pull over shirt/dress - Perfomed by patient: Thread/unthread right sleeve, Thread/unthread left sleeve Pull over shirt/dress - Perfomed by helper: Put head through opening, Pull shirt over trunk        Upper body assist        Lower Body Dressing/Undressing Lower body dressing   What is the patient wearing?: Underwear, Pants, Shoes Underwear - Performed by patient: Thread/unthread right underwear leg, Thread/unthread left underwear leg, Pull underwear up/down   Pants- Performed by patient: Thread/unthread right pants leg, Thread/unthread left pants leg Pants- Performed by helper: Pull pants up/down         Shoes - Performed by patient: Don/doff right shoe, Don/doff left shoe Shoes - Performed by helper: Don/doff right shoe, Don/doff left shoe  Lower body assist Assist for lower body dressing: Touching or steadying assistance (Pt > 75%)      Toileting Toileting   Toileting steps completed by patient: Performs perineal hygiene Toileting steps completed by helper: Adjust clothing prior to toileting, Adjust clothing after toileting Toileting Assistive Devices: Grab bar or rail  Toileting assist Assist level: Touching or steadying assistance (Pt.75%)    Transfers Chair/bed transfer   Chair/bed transfer method: Stand pivot Chair/bed transfer assist level: Touching or steadying assistance (Pt > 75%) Chair/bed transfer assistive device: Walker, Designer, fashion/clothing     Max distance: 150 Assist level: Touching or steadying assistance (Pt > 75%)   Wheelchair   Type: Manual Max wheelchair distance: 75 Assist Level: Moderate assistance (Pt 50 - 74%)  Cognition Comprehension Comprehension assist level: Follows basic conversation/direction with no assist  Expression Expression assist level: Expresses basic needs/ideas: With no assist  Social Interaction Social Interaction assist level: Interacts appropriately 90% of the time - Needs monitoring or encouragement for participation or interaction.  Problem Solving Problem solving assist level: Solves basic problems with no assist  Memory Memory assist level: Recognizes or recalls 90% of the time/requires cueing < 10% of the time   Medical Problem List and Plan: 1.  Right sided weakness and dysarthria secondary to left basal ganglia infarction secondary to small vessel disease  -continue therapies. motivated 2.  DVT Prophylaxis/Anticoagulation: SCDs. Monitor for any signs of DVT 3. Pain Management: Voltaren gel as needed 4. Mood: Celexa 40 mg daily 5. Neuropsych: This patient is capable of making decisions on her own behalf. 6. Skin/Wound Care: Routine skin checks 7. Fluids/Electrolytes/Nutrition: encourage PO  -replacing potassium 8. Dysphagia. Dysphagia #2 nectar liquids. Advance per speech therapy 9. Hypertension. Cozaar 100 mg daily, Toprol 25 mg daily  -remains under control 10. Hyperlipidemia. Lipitor 11. Diabetes mellitus with peripheral neuropathy. Hemoglobin A1c 5.5. Patient diet controlled prior to admission.  -reasonable control remains   LOS (Days) 3 A FACE TO FACE EVALUATION WAS PERFORMED  Ranelle Oyster, MD 07/21/2017 8:57 AM

## 2017-07-22 ENCOUNTER — Inpatient Hospital Stay (HOSPITAL_COMMUNITY): Payer: Medicare Other

## 2017-07-22 ENCOUNTER — Inpatient Hospital Stay (HOSPITAL_COMMUNITY): Payer: Medicare Other | Admitting: Speech Pathology

## 2017-07-22 ENCOUNTER — Inpatient Hospital Stay (HOSPITAL_COMMUNITY): Payer: Medicare Other | Admitting: Physical Therapy

## 2017-07-22 LAB — GLUCOSE, CAPILLARY
Glucose-Capillary: 72 mg/dL (ref 65–99)
Glucose-Capillary: 122 mg/dL — ABNORMAL HIGH (ref 65–99)
Glucose-Capillary: 91 mg/dL (ref 65–99)
Glucose-Capillary: 120 mg/dL — ABNORMAL HIGH (ref 65–99)

## 2017-07-22 MED ORDER — LOPERAMIDE HCL 2 MG PO CAPS
2.0000 mg | ORAL_CAPSULE | ORAL | Status: DC | PRN
  Administered 2017-07-22 (×2): 2 mg via ORAL
  Filled 2017-07-22 (×2): qty 1

## 2017-07-22 NOTE — Progress Notes (Signed)
Occupational Therapy Note  Patient Details  Name: Kristi David MRN: [id number] Date of Birth: 03/14/1941  Today's Date: 07/22/2017 OT Individual Time: 1300-1315 OT Individual Time Calculation (min): 15 min  and Today's Date: 07/22/2017 OT Missed Time: 15 Minutes Missed Time Reason: Patient ill (comment) (ongoing n/v)  Pt c/o ongoing n/v Individual Therapy  Pt resting in w/c upon arrival with visitors present.  Pt c/o ongoing n/v and "not feeling well."  Discussed OT LTGs and purpose of rehab.  Continued discharge planning and education.  Pt verbalized understanding and declined any further therapy this day.  Pt remained in w/c with all needs within reach.    Lavone NeriLanier, Jamesha Ellsworth [facility] 07/22/2017, 2:39 PM

## 2017-07-22 NOTE — Progress Notes (Signed)
SLP Cancellation Note  Patient Details Name: Kristi David MRN: [id number] DOB: 11/19/1940   Cancelled treatment:        Pt missed 45 minutes of skilled ST d/t c/o nausea.                                                                                                 Dywane Peruski 07/22/2017, 10:10 AM

## 2017-07-22 NOTE — Progress Notes (Signed)
Occupational Therapy Session Note  Patient Details  Name: Kristi David MRN: [id number] Date of Birth: 07/22/1941  Today's Date: 07/22/2017 OT Individual Time: 4132-44010800-0815 OT Individual Time Calculation (min): 15 min  and Today's Date: 07/22/2017 OT Missed Time: 45 Minutes Missed Time Reason: Patient ill (comment) (pt c/o n/v)   Short Term Goals: Week 1:  OT Short Term Goal 1 (Week 1): pt will bathe with S. OT Short Term Goal 2 (Week 1): Pt will actively use RUE as a stabilizing A during dressing tasks.  OT Short Term Goal 3 (Week 1): Pt will don shirt with S. OT Short Term Goal 4 (Week 1): Pt will don pants with S.  Skilled Therapeutic Interventions/Progress Updates:    Pt resting in bed upon arrival with breakfast untouched on bedside table.  RN stated that pt had just had bowel movement and was c/o nausea.  Pt informed therapist that she was unable to participate 2/2 nausea.  Reviewed goals and purpose of OT and rehab.  Pt verbalized understanding.  Pt stated she would try later in the day.   Therapy Documentation Precautions:  Precautions Precautions: Fall Restrictions Weight Bearing Restrictions: No General: General OT Amount of Missed Time: 45 Minutes Pain: Pain Assessment Pain Assessment: No/denies pain Pain Score: 0-No pain See Function Navigator for Current Functional Status.   Therapy/Group: Individual Therapy  Rich BraveLanier, Scott Vanderveer Chappell 07/22/2017, 8:29 AM

## 2017-07-22 NOTE — Progress Notes (Signed)
Physical Therapy Session Note  Patient Details  Name: Kristi David MRN: [id number] Date of Birth: 06-05-1941  Today's Date: 07/22/2017 PT Individual Time: 1100-1155 PT Individual Time Calculation (min): 55 min   Short Term Goals: Week 1:  PT Short Term Goal 1 (Week 1): Pt will perform bed mobility modI PT Short Term Goal 2 (Week 1): Pt will perform transfers with consistent S PT Short Term Goal 3 (Week 1): Pt will ambulate 150' with no AD and S PT Short Term Goal 4 (Week 1): Pt will ascend/descend 12 stairs with min guard  Skilled Therapeutic Interventions/Progress Updates: Pt received supine in bed, denies pain and reports upset stomach earlier in the morning is starting to feel better and she is ready to get up. Supine>sit with S and increased time. Gait to/from bathroom with min guard, occasional HHA d/t pt preference. Performs clothing management and hygiene with S. Gait to/from gym with min guard. Performed side stepping R/L, backwards walking with min guard. Assessed FGA with results as below indicating high risk for falls. Tandem stance BLE with rail for support for setup, able to demo 5-6 sec unsupported at most before LOB requiring UE assist to recover. Performed anterior/posterior steps over trekking pole with minA; improving weight shifting and RLE clearance. Lateral steps over trekking pole with increased difficulty d/t small step to R leaving little room for B feet on one side of pole. Pt reporting increased L side low back pain. Prone lying on elbows x5 min to promote normalized lumbar curve and reduce impingement on peripheral nerves. Returned to room with gait as above; remained seated in w/c with all needs in reach. RN alerted to pt request for pain and nausea medication.      Therapy Documentation Precautions:  Precautions Precautions: Fall Restrictions Weight Bearing Restrictions: No Pain: Pain Assessment Pain Assessment: No/denies pain Pain Score: 0-No pain  Functional  Gait Assessment (FGA) Requirements: A marked 6-m (20-ft) walkway that is marked with a 30.48-cm (12-in) width.  __2 1. GAIT LEVEL SURFACE Instructions: Walk at your normal speed from here to the next mark (6 m[20 ft]). Grading: Loraine Leriche the highest category that applies. (3) Normal-Walks 6 m (20 ft) in less than 5.5 seconds, no assistive devices, good speed, no evidence for imbalance, normal gait pattern, deviates no more than 15.24 cm (6 in) outside of the 30.48-cm (12-in) walkway width. (2) Mild impairment-Walks 6 m (20 ft) in less than 7 seconds but greater than 5.5 seconds, uses assistive device, slower speed, mild gait deviations, or deviates 15.24-25.4 cm (6-10 in) outside of the 30.48-cm (12-in) walkway width. (1) Moderate impairment-Walks 6 m (20 ft), slow speed, abnormal gait pattern, evidence for imbalance, or deviates 25.4-38.1 cm (10-15 in) outside of the 30.48-cm (12-in) walkway width. Requires more than 7 seconds to ambulate 6 m (20 ft). (0) Severe impairment-Cannot walk 6 m (20 ft) without assistance,severe gait deviations or imbalance, deviates greater than 38.1 cm (15 in) outside of the 30.48-cm (12-in) walkway width or reaches and touches the wall.  _1_ 2. CHANGE IN GAIT SPEED Instructions: Begin walking at your normal pace (for 1.5 m [5 ft]). When I tell you "go," walk as fast as you can (for 1.5 m [5 ft]). When I tell you "slow," walk as slowly as you can (for 1.5 m [5 ft]). Grading: Loraine Leriche the highest category that applies. (3) Normal-Able to smoothly change walking speed without loss of balance or gait deviation. Shows a significant difference in walking speeds between normal, fast,  and slow speeds. Deviates no more than 15.24 cm (6 in) outside of the 30.48-cm (12-in) walkway width. (2) Mild impairment-Is able to change speed but demonstrates mild gait deviations, deviates 15.24-25.4 cm (6-10 in) outside of the 30.48-cm (12-in) walkway width, or no gait deviations but unable to achieve  a significant change in velocity, or uses an assistive device. (1) Moderate impairment-Makes only minor adjustments to walking speed, or accomplishes a change in speed with significant gait deviations, deviates 25.4-38.1 cm (10-15 in) outside the 30.48-cm (12-in) walkway width, or changes speed but loses balance but is able to recover and continue walking. (0) Severe impairment-Cannot change speeds, deviates greater than 38.1 cm (15 in) outside 30.48-cm (12-in) walkway width, or loses balance and has to reach for wall or be caught.  _2_ 3. GAIT WITH HORIZONTAL HEAD TURNS Instructions: Walk from here to the next mark 6 m (20 ft) away. Begin walking at your normal pace. Keep walking straight; after 3 steps, turn your head to the right and keep walking straight while looking to the right. After 3 more steps, turn your head to the left and keep walking straight while looking left. Continue alternating looking right and left every 3 steps until you have completed 2 repetitions in each direction. Grading: Loraine Leriche the highest category that applies. (3) Normal-Performs head turns smoothly with no change in gait. Deviates no more than 15.24 cm (6 in) outside 30.48-cm (12-in) walkway width. (2) Mild impairment-Performs head turns smoothly with slight change in gait velocity (eg, minor disruption to smooth gait path), deviates 15.24-25.4 cm (6-10 in) outside 30.48-cm (12-in) walkway width, or uses an assistive device.  (1) Moderate impairment-Performs head turns with moderate change in gait velocity, slows down, deviates 25.4-38.1 cm (10-15 in) outside 30.48-cm (12-in) walkway width but recovers, can continue to walk. (0) Severe impairment-Performs task with severe disruption of gait (eg, staggers 38.1 cm [15 in] outside 30.48-cm (12-in) walkway width, loses balance, stops, or reaches for wall).  _1_ 4. GAIT WITH VERTICAL HEAD TURNS Instructions: Walk from here to the next mark (6 m [20 ft]). Begin walking at your  normal pace. Keep walking straight; after 3 steps, tip your head up and keep walking straight while looking up. After 3 more steps, tip your head down, keep walking straight while looking down. Continue alternating looking up and down every 3 steps until you have completed 2 repetitions in each direction. Grading: Loraine Leriche the highest category that applies. (3) Normal-Performs head turns with no change in gait. Deviates no more than 15.24 cm (6 in) outside 30.48-cm (12-in) walkway width. (2) Mild impairment-Performs task with slight change in gait velocity (eg, minor disruption to smooth gait path), deviates 15.24-25.4 cm (6-10 in) outside 30.48-cm (12-in) walkway width or uses assistive device. (1) Moderate impairment-Performs task with moderate change in gait velocity, slows down, deviates 25.4-38.1 cm (10-15 in) outside 30.48-cm (12-in) walkway width but recovers, can continue to walk. (0) Severe impairment-Performs task with severe disruption of gait (eg, staggers 38.1 cm [15 in] outside 30.48-cm (12-in) walkway width, loses balance, stops, reaches for wall).  _1_ 5. GAIT AND PIVOT TURN Instructions: Begin with walking at your normal pace. When I tell you, "turn and stop," turn as quickly as you can to face the opposite direction and stop. Grading: Loraine Leriche the highest category that applies. (3) Normal-Pivot turns safely within 3 seconds and stops quickly with no loss of balance. (2) Mild impairment-Pivot turns safely in _3 seconds and stops with no loss of balance,  or pivot turns safely within 3 seconds and stops with mild imbalance, requires small steps to catch balance. (1) Moderate impairment-Turns slowly, requires verbal cueing, or requires several small steps to catch balance following turn and stop. (0) Severe impairment-Cannot turn safely, requires assistance to turn and stop.  _1_ 6. STEP OVER OBSTACLE Instructions: Begin walking at your normal speed. When you come to the shoe box, step over  it, not around it, and keep walking. Grading: Loraine Leriche the highest category that applies. (3) Normal-Is able to step over 2 stacked shoe boxes taped together (22.86 cm [9 in] total height) without changing gait speed; no evidence of imbalance. (2) Mild impairment-Is able to step over one shoe box (11.43 cm [4.5 in] total height) without changing gait speed; no evidence of imbalance. (1) Moderate impairment-Is able to step over one shoe box (11.43 cm [4.5 in] total height) but must slow down and adjust steps to clear box safely. May require verbal cueing. (0) Severe impairment-Cannot perform without assistance.  _0_ 7. GAIT WITH NARROW BASE OF SUPPORT Instructions: Walk on the floor with arms folded across the chest, feet aligned heel to toe in tandem for a distance of 3.6 m [12 ft]. The number of steps taken in a straight line are counted for a maximum of 10 steps. Grading: Loraine Leriche the highest category that applies. (3) Normal-Is able to ambulate for 10 steps heel to toe with no staggering. (2) Mild impairment-Ambulates 7-9 steps. (1) Moderate impairment-Ambulates 4-7 steps. (0) Severe impairment-Ambulates less than 4 steps heel to toe or cannot perform without assistance.  _0_ 8. GAIT WITH EYES CLOSED Instructions: Walk at your normal speed from here to the next mark (6 m [20 ft]) with your eyes closed. Grading: Loraine Leriche the highest category that applies. (3) Normal-Walks 6 m (20 ft), no assistive devices, good speed, no evidence of imbalance, normal gait pattern, deviates no more than 15.24 cm (6 in) outside 30.48-cm (12-in) walkway width. Ambulates 6 m (20 ft) in less than 7 seconds. (2) Mild impairment-Walks 6 m (20 ft), uses assistive device, slower speed, mild gait deviations, deviates 15.24-25.4 cm (6-10 in) outside 30.48-cm (12-in) walkway width. Ambulates 6 m (20 ft) in less than 9 seconds but greater than 7 seconds. (1) Moderate impairment-Walks 6 m (20 ft), slow speed, abnormal gait pattern,  evidence for imbalance, deviates 25.4-38.1 cm (10-15 in) outside 30.48-cm (12-in) walkway width. Requires more than 9 seconds to ambulate 6 m (20 ft). (0) Severe impairment-Cannot walk 6 m (20 ft) without assistance, severe gait deviations or imbalance, deviates greater than 38.1 cm (15 in) outside 30.48-cm (12-in) walkway width or will not attempt task.  _1_ 9. AMBULATING BACKWARDS Instructions: Walk backwards until I tell you to stop. Grading: Loraine Leriche the highest category that applies. (3) Normal-Walks 6 m (20 ft), no assistive devices, good speed, no evidence for imbalance, normal gait pattern, deviates no more than 15.24 cm (6 in) outside 30.48-cm (12-in) walkway width. (2) Mild impairment-Walks 6 m (20 ft), uses assistive device, slower speed, mild gait deviations, deviates 15.24-25.4 cm (6-10 in) outside 30.48-cm (12-in) walkway width. (1) Moderate impairment-Walks 6 m (20 ft), slow speed, abnormal gait pattern, evidence for imbalance, deviates 25.4-38.1 cm (10-15 in) outside 30.48-cm (12-in) walkway width. (0) Severe impairment-Cannot walk 6 m (20 ft) without assistance, severe gait deviations or imbalance, deviates greater than 38.1 cm (15 in) outside 30.48-cm (12-in) walkway width or will not attempt task.  _1_ 10. STEPS Instructions: Walk up these stairs as you would  at home (ie, using the rail if necessary). At the top turn around and walk down. Grading: Loraine LericheMark the highest category that applies. (3) Normal-Alternating feet, no rail. (2) Mild impairment-Alternating feet, must use rail. (1) Moderate impairment-Two feet to a stair; must use rail. (0) Severe impairment-Cannot do safely.  TOTAL SCORE: ___10___ /30 (MAXIMUM SCORE=30)  Scores of ? 22/30 on the FGA were found to be effective in predicting falls, Sensitivity 85%, Specificity 86% Scores of ? 20/30 on the FGA were optimal to predict older adults residing in community dwellings who would sustain unexplained falls in the next 6  months, Sensitivity 100%, Specificity 76% Alvino Chapel(Wrisley & Lucianne MussKumar, 2010; aged 78 to 2490, Older Adults) MDC: 4.2 points for CVA Juel Burrow(Lin et al, 2010) MCID: 8 points for Balance and Vestibular Disorders Levander Campion(Marchetti and Juel BurrowLin, 2014)    See Function Navigator for Current Functional Status.   Therapy/Group: Individual Therapy  Vista Lawmanlizabeth J Tygielski 07/22/2017, 11:57 AM

## 2017-07-22 NOTE — Progress Notes (Signed)
[facility]ITATION     PROGRESS NOTE    Subjective/Complaints: No new complaints.   ROS: pt denies nausea, vomiting, diarrhea, cough, shortness of breath or chest pain   Objective: Vital Signs: Blood pressure (!) 148/62, pulse 70, temperature 98.4 F (36.9 C), temperature source Oral, resp. rate 18, height 5\' 7"  (1.702 m), SpO2 99 %. No results found. No results for input(s): WBC, HGB, HCT, PLT in the last 72 hours. No results for input(s): NA, K, CL, GLUCOSE, BUN, CREATININE, CALCIUM in the last 72 hours.  Invalid input(s): CO CBG (last 3)   Recent Labs  07/21/17 1629 07/21/17 2059 07/22/17 0652  GLUCAP 105* 101* 72    Wt Readings from Last 3 Encounters:  07/17/17 88 kg (194 lb 0.1 oz)  01/06/16 87.1 kg (192 lb)  12/31/15 87.2 kg (192 lb 3.9 oz)    Physical Exam:  Constitutional: She appears well-developed and well-nourished.  HENT:  Head: Normocephalic and atraumatic.  Eyes: EOM are normal. Right eye exhibits no discharge. Left eye exhibits no discharge.  Neck: Normal range of motion. Neck supple. No thyromegaly present.  Cardiovascular: RRR without murmur. No JVD  .   Respiratory:CTA Bilaterally without wheezes or rales. Normal effort  GI: BS +, non-tender, non-distended .  Musculoskeletal: She exhibits no edema or tenderness.  Neurological: She is alert.   Speech dysarthric, right central 7 and tongue deviation Motor: LUE/LLE: 5/5 proximal to distal RUE: Shoulder abduction   2+/5 proximal to distal, elbow flex/ext 2/5, grip 2 /5  RLE: 4-/5 HF, KE and 4/5 ADF/PF.  Sensation intact to light touch on right.    Skin: Skin is warm and dry.  Psychiatric: She has a normal mood and affect. Her behavior is normal.   Assessment/Plan: 1. Right hemiparesis and functional deficits secondary to right hemiparesis which require 3+ hours per day of interdisciplinary therapy in a comprehensive inpatient rehab setting. Physiatrist is providing  close team supervision and 24 hour management of active medical problems listed below. Physiatrist and rehab team continue to assess barriers to discharge/monitor patient progress toward functional and medical goals.  Function:  Bathing Bathing position   Position: Shower  Bathing parts Body parts bathed by patient: Right arm, Chest, Abdomen, Front perineal area, Buttocks, Right upper leg, Left upper leg, Right lower leg, Left lower leg Body parts bathed by helper: Left arm, Right lower leg, Left lower leg, Back  Bathing assist Assist Level: Touching or steadying assistance(Pt > 75%)      Upper Body Dressing/Undressing Upper body dressing   What is the patient wearing?: Pull over shirt/dress     Pull over shirt/dress - Perfomed by patient: Thread/unthread right sleeve, Thread/unthread left sleeve, Pull shirt over trunk Pull over shirt/dress - Perfomed by helper: Put head through opening        Upper body assist Assist Level: Touching or steadying assistance(Pt > 75%)      Lower Body Dressing/Undressing Lower body dressing   What is the patient wearing?: Underwear, Pants Underwear - Performed by patient: Thread/unthread right underwear leg, Thread/unthread left underwear leg, Pull underwear up/down   Pants- Performed by patient: Thread/unthread right pants leg, Thread/unthread left pants leg, Pull pants up/down Pants- Performed by helper: Pull pants up/down         Shoes - Performed by patient: Don/doff right shoe, Don/doff left shoe Shoes - Performed by helper: Don/doff right shoe, Don/doff left shoe          Lower body  assist Assist for lower body dressing: Supervision or verbal cues      Toileting Toileting   Toileting steps completed by patient: Performs perineal hygiene Toileting steps completed by helper: Adjust clothing prior to toileting, Adjust clothing after toileting, Performs perineal hygiene Toileting Assistive Devices: Grab bar or rail  Toileting assist  Assist level: Touching or steadying assistance (Pt.75%)   Transfers Chair/bed transfer   Chair/bed transfer method: Stand pivot Chair/bed transfer assist level: Touching or steadying assistance (Pt > 75%) Chair/bed transfer assistive device: Walker, Designer, fashion/clothingArmrests     Locomotion Ambulation     Max distance: 150 Assist level: Touching or steadying assistance (Pt > 75%)   Wheelchair   Type: Manual Max wheelchair distance: 75 Assist Level: Moderate assistance (Pt 50 - 74%)  Cognition Comprehension Comprehension assist level: Follows basic conversation/direction with extra time/assistive device  Expression Expression assist level: Expresses basic needs/ideas: With extra time/assistive device  Social Interaction Social Interaction assist level: Interacts appropriately 90% of the time - Needs monitoring or encouragement for participation or interaction.  Problem Solving Problem solving assist level: Solves basic problems with no assist  Memory Memory assist level: Recognizes or recalls 90% of the time/requires cueing < 10% of the time   Medical Problem List and Plan: 1.  Right sided weakness and dysarthria secondary to left basal ganglia infarction secondary to small vessel disease  -continue therapies. remains motivated 2.  DVT Prophylaxis/Anticoagulation: SCDs. Monitor for any signs of DVT 3. Pain Management: Voltaren gel as needed 4. Mood: Celexa 40 mg daily 5. Neuropsych: This patient is capable of making decisions on her own behalf. 6. Skin/Wound Care: Routine skin checks 7. Fluids/Electrolytes/Nutrition: encourage PO  -replacing potassium 8. Dysphagia. Dysphagia #2 nectar liquids. Advance per speech therapy 9. Hypertension. Cozaar 100 mg daily, Toprol 25 mg daily  -remains under control 10. Hyperlipidemia. Lipitor 11. Diabetes mellitus with peripheral neuropathy. Hemoglobin A1c 5.5. Patient diet controlled prior to admission.  -reasonable control remains   LOS (Days) 4 A FACE  TO FACE EVALUATION WAS PERFORMED  Ranelle OysterSWARTZ,Clanton Emanuelson T, MD 07/22/2017 9:35 AM

## 2017-07-23 ENCOUNTER — Inpatient Hospital Stay (HOSPITAL_COMMUNITY): Payer: Medicare Other | Admitting: Occupational Therapy

## 2017-07-23 ENCOUNTER — Inpatient Hospital Stay (HOSPITAL_COMMUNITY): Payer: Medicare Other | Admitting: Physical Therapy

## 2017-07-23 LAB — GLUCOSE, CAPILLARY
GLUCOSE-CAPILLARY: 103 mg/dL — AB (ref 65–99)
GLUCOSE-CAPILLARY: 97 mg/dL (ref 65–99)
Glucose-Capillary: 104 mg/dL — ABNORMAL HIGH (ref 65–99)
Glucose-Capillary: 88 mg/dL (ref 65–99)

## 2017-07-23 NOTE — Progress Notes (Signed)
Physical Therapy Session Note  Patient Details  Name: Kristi David MRN: [id number] Date of Birth: 11/28/1940  Today's Date: 07/23/2017 PT Individual Time: 0901-0957 PT Individual Time Calculation (min): 56 min    Skilled Therapeutic Interventions/Progress Updates:    Session initiated with pt lying in bed denying pain.  Vitals pre:  129/79;  HR: 72 bpm;  Session focused on decreasing BOS during gait (without AD, pt ambulates with wide BOS), improving R LE power/strength, and R scapular stability.  Pt performed sit to stand for 2 x 5 with t-band around thighs to increase R hip abd activity, heel raises, step ups with R LE with no UE and facilitation of trunk flexion with ant pelvic tilt for R LE strength, ambulation with SPC (demos decreased BOS and improved step symmetry b/l with using SPC), standing hip flexion to lap tray, sit to prop on elbow with manual facilitation from PT for scap stability in b/l directions, and s/l PNF with R scap utilizing rhythmic initiation for anterior elevation and posterior depression. Following session, pt returned to room, left up in recliner chair with call bell and phone in reach.  Pt verbalized need to call nursing to get up.   Therapy Documentation Precautions:  Precautions Precautions: Fall Restrictions Weight Bearing Restrictions: No    See Function Navigator for Current Functional Status.   Therapy/Group: Individual Therapy  Kristi David Kristi David 07/23/2017, 10:02 AM

## 2017-07-23 NOTE — Progress Notes (Signed)
Occupational Therapy Session Note  Patient Details  Name: Kristi David MRN: [id number] Date of Birth: 10/11/1940  Today's Date: 07/23/2017 OT Individual Time: 1011-1105 and 1335-1450 OT Individual Time Calculation (min): 54 min and 75 min   Short Term Goals: Week 1:  OT Short Term Goal 1 (Week 1): pt will bathe with S. OT Short Term Goal 2 (Week 1): Pt will actively use RUE as a stabilizing A during dressing tasks.  OT Short Term Goal 3 (Week 1): Pt will don shirt with S. OT Short Term Goal 4 (Week 1): Pt will don pants with S.  Skilled Therapeutic Interventions/Progress Updates:    Tx focus on Rt NMR, Rt attention, balance, and functional ambulation with device during self care completion.   Pt greeted in recliner, agreeable to complete bathing/dressing. She ambulated with SPC into bathroom with Min A, voided on toilet and doffed clothing with adaptive hemi techniques "this is how Tom taught me to do it." She then transferred to TTB with HHA on Rt side. Pt bathing at sit<stand level, required cues for attending to Rt side of body and scanning to Rt for wash cloth placed on thigh. Pt able to actively abduct R UE to wash her under-arm! Mod cues for keeping R UE on lap instead of allowing it to dangle while seated. Pt flexing trunk to wash bilateral feet without LOBs. Afterwards she proceeded to dress while seated on low toilet. Min A for sit<stands with min cues for hemi techniques. Pt donning shoes with setup. She then ambulated to sink, requested to sit to complete oral care and grooming tasks due to fatigue. Facilitated R UE as gross stabilizer during tasks and weightbearing through forearm on sink. Pt with increased initiation to incorporate affected UE during this time, tried to put lip balm in Rt hand herself. After completing handwashing, pt ambulated back to recliner and was repositioned for comfort. Left her with all needs within reach.    2nd Session 1:1 tx (75 min) Tx focus on  balance, Rt NMR, and functional ambulation with and without device during IADL engagement.   Pt greeted in recliner, asleep, easily woken. Agreeable to tx. Pt ambulated with Min A HHA and SPC to dayroom. Had her engage in table washing without device, side stepping, and using L UE for assisting Rt to wash/dry tables. A little bit of R UE active ROM in IR/ER! Pt bending outside of base of support without LOBs. Min A sit<stand from Engineer, agriculturalmultiple armless chairs. Pt ambulating to different tables with Min A HHA to increase sensory input/weightbearing on Rt side. She then ambulated back to room and transferred to toilet. Pt voided bladder and completed hygiene and clothing mgt with steady assist to protect Rt UE. After handwashing at sink with cues for bilateral integration, she transferred to bed. Lateral scooting up in bed with min guard. Pt was repositioned for comfort in supine and left with all needs within reach.   Therapy Documentation Precautions:  Precautions Precautions: Fall Restrictions Weight Bearing Restrictions: No Pain: No c/o pain during tx  Pain Assessment Pain Score:  (reports "better") ADL: ADL ADL Comments: refer to functional navigator     See Function Navigator for Current Functional Status.   Therapy/Group: Individual Therapy  Kinnick Maus A Gearline Spilman 07/23/2017, 12:47 PM

## 2017-07-23 NOTE — Progress Notes (Signed)
[place] PHYSICAL MEDICINE & REHABILITATION     PROGRESS NOTE    Subjective/Complaints: Had some nausea yesterday but feeling better today  ROS: pt denies nausea, vomiting, diarrhea, cough, shortness of breath or chest pain    Objective: Vital Signs: Blood pressure 126/66, pulse 71, temperature 98.2 F (36.8 C), temperature source Oral, resp. rate 18, height 5\' 7"  (1.702 m), SpO2 95 %. No results found. No results for input(s): WBC, HGB, HCT, PLT in the last 72 hours. No results for input(s): NA, K, CL, GLUCOSE, BUN, CREATININE, CALCIUM in the last 72 hours.  Invalid input(s): CO CBG (last 3)   Recent Labs  07/22/17 1616 07/22/17 2058 07/23/17 0801  GLUCAP 91 120* 103*    Wt Readings from Last 3 Encounters:  07/17/17 88 kg (194 lb 0.1 oz)  01/06/16 87.1 kg (192 lb)  12/31/15 87.2 kg (192 lb 3.9 oz)    Physical Exam:  Constitutional: She appears well-developed and well-nourished.  HENT:  Head: Normocephalic and atraumatic.  Eyes: EOM are normal. Right eye exhibits no discharge. Left eye exhibits no discharge.  Neck: Normal range of motion. Neck supple. No thyromegaly present.  Cardiovascular: RRR without murmur. No JVD   Respiratory:CTA Bilaterally without wheezes or rales. Normal effort   GI: BS +, non-tender, non-distended .  Musculoskeletal: She exhibits no edema or tenderness.  Neurological: She is alert.   Speech dysarthric, right central 7 and tongue deviation Motor: LUE/LLE: 5/5 proximal to distal RUE: Shoulder abduction   2+/5 proximal to distal, elbow flex/ext 2/5, grip 2 /5  RLE: 4-/5 HF, KE and 4/5 ADF/PF.  Sensation intact to light touch on right.    Skin: Skin is warm and dry.  Psychiatric: She has a normal mood and affect. Her behavior is normal.   Assessment/Plan: 1. Right hemiparesis and functional deficits secondary to right hemiparesis which require 3+ hours per day of interdisciplinary therapy in a comprehensive inpatient rehab  setting. Physiatrist is providing close team supervision and 24 hour management of active medical problems listed below. Physiatrist and rehab team continue to assess barriers to discharge/monitor patient progress toward functional and medical goals.  Function:  Bathing Bathing position   Position: Shower  Bathing parts Body parts bathed by patient: Right arm, Chest, Abdomen, Front perineal area, Buttocks, Right upper leg, Left upper leg, Right lower leg, Left lower leg Body parts bathed by helper: Left arm, Right lower leg, Left lower leg, Back  Bathing assist Assist Level: Touching or steadying assistance(Pt > 75%)      Upper Body Dressing/Undressing Upper body dressing   What is the patient wearing?: Pull over shirt/dress     Pull over shirt/dress - Perfomed by patient: Thread/unthread right sleeve, Thread/unthread left sleeve, Pull shirt over trunk Pull over shirt/dress - Perfomed by helper: Put head through opening        Upper body assist Assist Level: Touching or steadying assistance(Pt > 75%)      Lower Body Dressing/Undressing Lower body dressing   What is the patient wearing?: Underwear, Pants Underwear - Performed by patient: Thread/unthread right underwear leg, Thread/unthread left underwear leg, Pull underwear up/down   Pants- Performed by patient: Thread/unthread right pants leg, Thread/unthread left pants leg, Pull pants up/down Pants- Performed by helper: Pull pants up/down         Shoes - Performed by patient: Don/doff right shoe, Don/doff left shoe Shoes - Performed by helper: Don/doff right shoe, Don/doff left shoe  Lower body assist Assist for lower body dressing: Supervision or verbal cues      Toileting Toileting   Toileting steps completed by patient: Adjust clothing prior to toileting, Performs perineal hygiene, Adjust clothing after toileting Toileting steps completed by helper: Adjust clothing prior to toileting, Adjust clothing  after toileting, Performs perineal hygiene Toileting Assistive Devices: Grab bar or rail  Toileting assist Assist level: Touching or steadying assistance (Pt.75%)   Transfers Chair/bed transfer   Chair/bed transfer method: Stand pivot Chair/bed transfer assist level: Touching or steadying assistance (Pt > 75%) Chair/bed transfer assistive device: Walker, Designer, fashion/clothing     Max distance: 150 Assist level: Touching or steadying assistance (Pt > 75%)   Wheelchair   Type: Manual Max wheelchair distance: 75 Assist Level: Moderate assistance (Pt 50 - 74%)  Cognition Comprehension Comprehension assist level: Follows basic conversation/direction with extra time/assistive device  Expression Expression assist level: Expresses basic needs/ideas: With extra time/assistive device  Social Interaction Social Interaction assist level: Interacts appropriately with others with medication or extra time (anti-anxiety, antidepressant).  Problem Solving Problem solving assist level: Solves basic 90% of the time/requires cueing < 10% of the time  Memory Memory assist level: Requires cues to use assistive device   Medical Problem List and Plan: 1.  Right sided weakness and dysarthria secondary to left basal ganglia infarction secondary to small vessel disease  -continue therapies. remains motivated 2.  DVT Prophylaxis/Anticoagulation: SCDs. Monitor for any signs of DVT 3. Pain Management: Voltaren gel as needed 4. Mood: Celexa 40 mg daily 5. Neuropsych: This patient is capable of making decisions on her own behalf. 6. Skin/Wound Care: Routine skin checks 7. Fluids/Electrolytes/Nutrition: encourage PO  -replacing potassium 8. Dysphagia. Dysphagia #2 nectar liquids. Advance per speech therapy 9. Hypertension. Cozaar 100 mg daily, Toprol 25 mg daily  -remains under control 10. Hyperlipidemia. Lipitor 11. Diabetes mellitus with peripheral neuropathy. Hemoglobin A1c 5.5. Patient diet  controlled prior to admission.  -reasonable control at present   LOS (Days) 5 A FACE TO FACE EVALUATION WAS PERFORMED  Ranelle Oyster, MD 07/23/2017 9:22 AM

## 2017-07-24 ENCOUNTER — Inpatient Hospital Stay (HOSPITAL_COMMUNITY): Payer: Medicare Other

## 2017-07-24 LAB — GLUCOSE, CAPILLARY
GLUCOSE-CAPILLARY: 89 mg/dL (ref 65–99)
GLUCOSE-CAPILLARY: 102 mg/dL — AB (ref 65–99)
GLUCOSE-CAPILLARY: 97 mg/dL (ref 65–99)
Glucose-Capillary: 112 mg/dL — ABNORMAL HIGH (ref 65–99)

## 2017-07-24 NOTE — Progress Notes (Signed)
Patient refuses to wear SCD's. Educated patient and family member. Notified MD.

## 2017-07-24 NOTE — Progress Notes (Signed)
[place] PHYSICAL MEDICINE & REHABILITATION     PROGRESS NOTE    Subjective/Complaints: No new complaints. Slept well.   ROS: pt denies nausea, vomiting, diarrhea, cough, shortness of breath or chest pain    Objective: Vital Signs: Blood pressure 118/61, pulse 66, temperature 98 F (36.7 C), temperature source Oral, resp. rate 18, height 5\' 7"  (1.702 m), SpO2 100 %. No results found. No results for input(s): WBC, HGB, HCT, PLT in the last 72 hours. No results for input(s): NA, K, CL, GLUCOSE, BUN, CREATININE, CALCIUM in the last 72 hours.  Invalid input(s): CO CBG (last 3)   Recent Labs  07/23/17 2129 07/24/17 0640 07/24/17 1153  GLUCAP 88 112* 89    Wt Readings from Last 3 Encounters:  07/17/17 88 kg (194 lb 0.1 oz)  01/06/16 87.1 kg (192 lb)  12/31/15 87.2 kg (192 lb 3.9 oz)    Physical Exam:  Constitutional: She appears well-developed and well-nourished.  HENT:  Head: Normocephalic and atraumatic.  Eyes: EOM are normal. Right eye exhibits no discharge. Left eye exhibits no discharge.  Neck: Normal range of motion. Neck supple. No thyromegaly present.  Cardiovascular: RRR without murmur. No JVD   Respiratory:CTA Bilaterally without wheezes or rales. Normal effort   GI: BS +, non-tender, non-distended .  Musculoskeletal: She exhibits no edema or tenderness.  Neurological: She is alert.   Speech dysarthric, right central 7 and tongue deviation Motor: LUE/LLE: 5/5 proximal to distal RUE: Shoulder abduction   2+/5 proximal to distal, elbow flex/ext 2/5, grip 2 /5  RLE: 4-/5 HF, KE and 4/5 ADF/PF.  Sensation intact to light touch on right.    Skin: Skin is warm and dry.  Psychiatric: She has a normal mood and affect. Her behavior is normal.   Assessment/Plan: 1. Right hemiparesis and functional deficits secondary to right hemiparesis which require 3+ hours per day of interdisciplinary therapy in a comprehensive inpatient rehab setting. Physiatrist is providing  close team supervision and 24 hour management of active medical problems listed below. Physiatrist and rehab team continue to assess barriers to discharge/monitor patient progress toward functional and medical goals.  Function:  Bathing Bathing position   Position: Shower  Bathing parts Body parts bathed by patient: Right arm, Chest, Abdomen, Front perineal area, Buttocks, Right upper leg, Left upper leg, Right lower leg, Left lower leg Body parts bathed by helper: Left arm, Back  Bathing assist Assist Level: Touching or steadying assistance(Pt > 75%)      Upper Body Dressing/Undressing Upper body dressing   What is the patient wearing?: Pull over shirt/dress     Pull over shirt/dress - Perfomed by patient: Thread/unthread right sleeve, Thread/unthread left sleeve, Pull shirt over trunk, Put head through opening Pull over shirt/dress - Perfomed by helper: Put head through opening        Upper body assist Assist Level: Supervision or verbal cues      Lower Body Dressing/Undressing Lower body dressing   What is the patient wearing?: Underwear, Pants, Shoes Underwear - Performed by patient: Thread/unthread right underwear leg, Thread/unthread left underwear leg, Pull underwear up/down   Pants- Performed by patient: Thread/unthread right pants leg, Thread/unthread left pants leg, Pull pants up/down Pants- Performed by helper: Pull pants up/down         Shoes - Performed by patient: Don/doff right shoe, Don/doff left shoe Shoes - Performed by helper: Don/doff right shoe, Don/doff left shoe          Lower body assist Assist  for lower body dressing: Touching or steadying assistance (Pt > 75%)      Toileting Toileting   Toileting steps completed by patient: Adjust clothing prior to toileting, Performs perineal hygiene, Adjust clothing after toileting Toileting steps completed by helper: Adjust clothing after toileting Toileting Assistive Devices: Grab bar or rail  Toileting  assist Assist level: Touching or steadying assistance (Pt.75%)   Transfers Chair/bed transfer   Chair/bed transfer method: Stand pivot Chair/bed transfer assist level: Touching or steadying assistance (Pt > 75%) Chair/bed transfer assistive device: Walker, Designer, fashion/clothing     Max distance:  (150 ft) Assist level: Touching or steadying assistance (Pt > 75%)   Wheelchair   Type: Manual Max wheelchair distance: 75 Assist Level: Moderate assistance (Pt 50 - 74%)  Cognition Comprehension Comprehension assist level: Follows basic conversation/direction with extra time/assistive device  Expression Expression assist level: Expresses basic needs/ideas: With extra time/assistive device  Social Interaction Social Interaction assist level: Interacts appropriately 90% of the time - Needs monitoring or encouragement for participation or interaction.  Problem Solving Problem solving assist level: Solves basic 90% of the time/requires cueing < 10% of the time  Memory Memory assist level: Requires cues to use assistive device   Medical Problem List and Plan: 1.  Right sided weakness and dysarthria secondary to left basal ganglia infarction secondary to small vessel disease  -continue therapies. remains motivated  -reviewed progress with son who was present today 2.  DVT Prophylaxis/Anticoagulation: SCDs. Monitor for any signs of DVT 3. Pain Management: Voltaren gel as needed 4. Mood: Celexa 40 mg daily 5. Neuropsych: This patient is capable of making decisions on her own behalf. 6. Skin/Wound Care: Routine skin checks 7. Fluids/Electrolytes/Nutrition: encourage PO  -replacing potassium 8. Dysphagia. Dysphagia #2 nectar liquids. Advance per speech therapy 9. Hypertension. Cozaar 100 mg daily, Toprol 25 mg daily  -remains under control 10. Hyperlipidemia. Lipitor 11. Diabetes mellitus with peripheral neuropathy. Hemoglobin A1c 5.5. Patient diet controlled prior to  admission.  -good control   LOS (Days) 6 A FACE TO FACE EVALUATION WAS PERFORMED  Ranelle Oyster, MD 07/24/2017 12:09 PM

## 2017-07-24 NOTE — Progress Notes (Signed)
[facility] Individual Statement of Services  Patient Name:  Jossie NgHettie Vondrasek  Date:  07/24/2017  Welcome to the [facility].  Our goal is to provide you with an individualized program based on your diagnosis and situation, designed to meet your specific needs.  With this comprehensive rehabilitation program, you will be expected to participate in at least 3 hours of rehabilitation therapies Monday-Friday, with modified therapy programming on the weekends.  Your rehabilitation program will include the following services:  Physical Therapy (PT), Occupational Therapy (OT), 24 hour per day rehabilitation nursing, Case Management (Social Worker), Rehabilitation Medicine, Nutrition Services and Pharmacy Services  Weekly team conferences will be held on Tuesdays to discuss your progress.  Your Social Worker will talk with you frequently to get your input and to update you on team discussions.  Team conferences with you and your family in attendance may also be held.  Expected length of stay:  2 weeks  Overall anticipated outcome:  Supervision  Depending on your progress and recovery, your program may change. Your Social Worker will coordinate services and will keep you informed of any changes. Your Social Worker's name and contact numbers are listed  below.  The following services may also be recommended but are not provided by the [facility]:   Driving Evaluations  Home Health Rehabiltiation Services  Outpatient Rehabilitation Services   Arrangements will be made to provide these services after discharge if needed.  Arrangements include referral to agencies that provide these services.  Your insurance has been verified to be:  Medicare and AARP Your primary doctor is:  Dr. Sigmund HazelLisa Miller  Pertinent information will be shared with your doctor and your insurance company.  Social Worker:  Staci AcostaJenny Keisuke Hollabaugh, LCSW  [phone] or ([phone])  [phone]  Information discussed with and copy given to patient by: Elvera LennoxPrevatt, Uzziah Rigg Capps, 07/21/2017, 9:44 AM

## 2017-07-24 NOTE — Progress Notes (Signed)
Social Work Patient ID: Kristi NgHettie Mallo, female   DOB: 04/05/1941, 76 y.o.   MRN: [id number]   CSW talked with pt to update her on team conference discussion and targeted d/c date of 07-30-17.  She gave CSW permission to call to update her son, as he is really her only local family and the one who checks in on her regularly.  CSW spoke with son to do the above.  He was very kind and devoted to his mother and is appreciative that she could come to CIR.  CSW explained that pt's goals currently are for supervision.  He stated that he cannot provide this, but would check with some people who could possibly help.  CSW stated that if pt continues to progress, therapists may move her goals to mod I with some supervision level goals.  This would be more manageable for pt/family.  Pt does live in senior independent living apt and has an emergency call button, so she feels comfortable going home.  She has friends/neighbors she feels will check in on her.  CSW will continue to follow and assist as needed.

## 2017-07-25 ENCOUNTER — Inpatient Hospital Stay (HOSPITAL_COMMUNITY): Payer: Medicare Other

## 2017-07-25 ENCOUNTER — Inpatient Hospital Stay (HOSPITAL_COMMUNITY): Payer: Medicare Other | Admitting: Physical Therapy

## 2017-07-25 LAB — GLUCOSE, CAPILLARY
GLUCOSE-CAPILLARY: 94 mg/dL (ref 65–99)
GLUCOSE-CAPILLARY: 102 mg/dL — AB (ref 65–99)
Glucose-Capillary: 102 mg/dL — ABNORMAL HIGH (ref 65–99)
Glucose-Capillary: 108 mg/dL — ABNORMAL HIGH (ref 65–99)

## 2017-07-25 NOTE — Progress Notes (Signed)
Physical Therapy Session Note  Patient Details  Name: Kristi NgHettie Semrad MRN: [id number] Date of Birth: 03/17/1941  Today's Date: 07/25/2017 PT Individual Time: 0800-0900 PT Individual Time Calculation (min): 60 min   Short Term Goals: Week 1:  PT Short Term Goal 1 (Week 1): Pt will perform bed mobility modI PT Short Term Goal 2 (Week 1): Pt will perform transfers with consistent S PT Short Term Goal 3 (Week 1): Pt will ambulate 150' with no AD and S PT Short Term Goal 4 (Week 1): Pt will ascend/descend 12 stairs with min guard  Skilled Therapeutic Interventions/Progress Updates: Pt received supine in bed, c/o pain in R shoulder as below. Pt performs self AAROM/PROM with LUE holding RUE throughout session for pain management. Bed mobility with S and increased time. Dons pants and shoes with setupA and S for balance. Gait to/from gym with [employer] [facility] [employer] and close S; min cues for R foot clearance and step length. Standing gastroc stretch on wedge x2 min, 2x15 reps heel raises. Progressive balance exercises in parallel bars normal BOS eyes open/closed, narrow BOS eyes open, staggered stance R/L with min guard and occasional use of LUE to recover LOBs. Normal, narrow and staggered stance on airex foam pad with min/modA, increased LOBs on unstable surface, however improved with repetition. Hip abduction 2x10 BLE. Gait backwards and sidestepping R/L with min guard and cues for step length for focus on weight shifting, foot clearance, ROM to hips/ankles. Returned to room with gait as above. Assisted pt with setup of breakfast tray and provided S for eating until handoff to OT for next session.      Therapy Documentation Precautions:  Precautions Precautions: Fall Restrictions Weight Bearing Restrictions: No Pain: Pain Assessment Pain Assessment: 0-10 Pain Score: 8  Pain Type: Acute pain Pain Location: Arm Pain Orientation: Left Pain Descriptors / Indicators: Aching Pain Intervention(s): Medication (See  eMAR)   See Function Navigator for Current Functional Status.   Therapy/Group: Individual Therapy  Vista Lawmanlizabeth J Tygielski 07/25/2017, 8:50 AM

## 2017-07-25 NOTE — Progress Notes (Signed)
Occupational Therapy Session Note  Patient Details  Name: Kristi David MRN: [id number] Date of Birth: 12/12/1940  Today's Date: 07/25/2017 OT Individual Time: [id number] OT Individual Time Calculation (min): 56 min    Short Term Goals: Week 1:  OT Short Term Goal 1 (Week 1): pt will bathe with S. OT Short Term Goal 2 (Week 1): Pt will actively use RUE as a stabilizing A during dressing tasks.  OT Short Term Goal 3 (Week 1): Pt will don shirt with S. OT Short Term Goal 4 (Week 1): Pt will don pants with S.  Skilled Therapeutic Interventions/Progress Updates:    Pt engaged in BADL retraining including bathing at shower level and dressing with sit<>stand from seat.  Pt amb with SPC in room to gather clothing prior to entering bathroom for shower.  Pt recalled hemi techniques with min verbal cues.  Pt initiates use of RUE during bathing tasks employing AAROM to engage RUE in bathing tasks and when applying lotion.  Pt completed dressing tasks with sit<>stand requiring steady A when standing.  Pt returned to room and completed oral hygiene while standing at sink.  Pt returned to recliner with all needs within reach.  Focus on activity tolerance, sit<>stand, functional amb with SPC, standing balance, and safety awareness to increase independence with BADLs.   Therapy Documentation Precautions:  Precautions Precautions: Fall Restrictions Weight Bearing Restrictions: No  Pain: Pt denies pain  See Function Navigator for Current Functional Status.   Therapy/Group: Individual Therapy  Rich BraveLanier, Jamilla Galli Chappell 07/25/2017, 10:01 AM

## 2017-07-25 NOTE — Progress Notes (Signed)
Speech Language Pathology Daily Session Note  Patient Details  Name: Kristi David MRN: [id number] Date of Birth: 05/22/1941  Today's Date: 07/25/2017 SLP Individual Time: 1305-1400 SLP Individual Time Calculation (min): 55 min  Short Term Goals: Week 1: SLP Short Term Goal 1 (Week 1): Pt will consume dys 2 and nectar thick liquids with Min overt s/s aspiration and Min A verbal cues for use of swallowing compensatory strategies. SLP Short Term Goal 2 (Week 1): Pt will tolerate trials of thin liquids with overt s/s aspiration in order to assess readiness for instrumental swallow study. SLP Short Term Goal 3 (Week 1): Pt will tolerate trials of dys 3 with Min overt s/s aspritation and Min A verbal cues to utilize swallow compensatory strategies to clear oral residue. SLP Short Term Goal 4 (Week 1): Pt will demonstarte functional probelm solving for basic tasks with Mod A verbal cues. SLP Short Term Goal 5 (Week 1): Pt will recall daily infromation with Mod A verbal cues for use of external aids. SLP Short Term Goal 6 (Week 1): Pt will utilize speech intellgibility strategies at the sentence level with Mod A verbal cues to achieve 70% intellgibility.   Skilled Therapeutic Interventions: Skilled ST services focused on swallow and cognitive skills. Pt required supervision A questions cues to utilize swallow strategies when consuming NTL. SLP facilitated working memory and mildly complex problem solving utilizing daily math problem from standardized ALFA, pt required Mod A verbal and visual cues to complete problems. Pt demonstrated recall of today's therapy events with Min A verbal cues. SLP facilitated oral care and transfer to toilet, demonstrating Min A verbal cues to use safe precautions. SLP facilitated trials of thin liquid via TSP and cup sips, demonstrating 1 immediate cough of self-feed cup sip. SLP gave verbal cues to remind pt to take small sips and no other overt s/s aspiration were noted. Pt  was left in room with visitors present and call bell within reach. Recommend to continue ST services.      Function:  Eating Eating   Modified Consistency Diet: No Eating Assist Level: Supervision or verbal cues           Cognition Comprehension Comprehension assist level: Follows basic conversation/direction with extra time/assistive device  Expression   Expression assist level: Expresses complex ideas: With extra time/assistive device  Social Interaction Social Interaction assist level: Interacts appropriately with others with medication or extra time (anti-anxiety, antidepressant).  Problem Solving Problem solving assist level: Solves basic 90% of the time/requires cueing < 10% of the time  Memory Memory assist level: Recognizes or recalls 90% of the time/requires cueing < 10% of the time    Pain Pain Assessment Pain Assessment: No/denies pain  Therapy/Group: Individual Therapy  Kym Scannell  [facility] 07/25/2017, 3:35 PM

## 2017-07-25 NOTE — Progress Notes (Signed)
Social Work Assessment and Plan  Patient Details  Name: Kristi David MRN: [id number] Date of Birth: 17-Nov-1940  Today's Date: 07/19/2017  Problem List:  Patient Active Problem List   Diagnosis Date Noted  . Left basal ganglia embolic stroke ([place]) 13/14/3888  . Facial droop   . Right sided weakness   . Diabetes mellitus ([place])   . Diastolic dysfunction   . Dysphagia, post-stroke   . Dysarthria, post-stroke   . Hypokalemia   . Acute blood loss anemia   . Hyperlipidemia   . Stroke ([place]) 07/16/2017  . Hypertension 07/16/2017  . Arthritis of right hand 01/11/2017  . Osteoarthritis of right knee 01/06/2016  . Status post right partial knee replacement 01/06/2016   Past Medical History:  Past Medical History:  Diagnosis Date  . Acute blood loss anemia   . Anemia   . Arthritis    "legs, knees" (01/07/2016)  . Arthritis of right hand 01/11/2017  . Diabetes mellitus ([place])   . Diastolic dysfunction   . Dysarthria, post-stroke   . Dysphagia, post-stroke   . Facial droop   . GERD (gastroesophageal reflux disease)    no meds  . High cholesterol   . History of blood transfusion   . Hyperlipidemia   . Hypertension   . Hypokalemia   . Left basal ganglia embolic stroke ([place]) 75/79/7282  . Osteoarthritis of right knee 01/06/2016  . Seasonal allergies   . Shortness of breath dyspnea    if too active  . Stroke ([place]) 07/16/2017  . Syncope    passed out and was hospitalized for a couple of days  . Type II diabetes mellitus ([facility])    does not take medicine diet controlled   Past Surgical History:  Past Surgical History:  Procedure Laterality Date  . BUNIONECTOMY Bilateral   . CATARACT EXTRACTION W/ INTRAOCULAR LENS  IMPLANT, BILATERAL Bilateral   . INGUINAL HERNIA REPAIR Right   . KNEE ARTHROSCOPY WITH EXCISION BAKER'S CYST Right   . MR LOWER LEG LEFT ([facility]) Left    after a break  . PARTIAL KNEE ARTHROPLASTY Right 01/06/2016   Procedure: RIGHT UNICOMPARTMENTAL KNEE  ARTHROPLASTY;  Surgeon: Mcarthur Rossetti, MD;  Location: [facility];  Service: Orthopedics;  Laterality: Right;  . VEIN LIGATION AND STRIPPING Bilateral    Social History:  reports that she has never smoked. She has never used smokeless tobacco. She reports that she does not drink alcohol or use drugs.  Family / Support Systems Marital Status: Divorced Patient Roles: Parent, Other (Comment) (friend/neighbor; sister) Children: Correna Meacham - son - [phone] Anticipated Caregiver: Self and son Ability/Limitations of Caregiver: two sons local, 2 sons in [place], [place] in [place], [place] in [place] Caregiver Availability: Intermittent Family Dynamics: Son is very devoted to pt and is the only son locally who can help pt.  Other son has some disabilities and cannot assist.  Social History Preferred language: English Religion: Episcopalian Read: Yes Write: Yes Employment Status: Retired Public relations account executive Issues: none reported Guardian/Conservator: N/A - MD has determined that pt is capable of making her own decisions.   Abuse/Neglect Physical Abuse: Denies Verbal Abuse: Denies Sexual Abuse: Denies Exploitation of patient/patient's resources: Denies Self-Neglect: Denies  Emotional Status Pt's affect, behavior and adjustment status: Pt is motivated and positive about her recovery.  She does not intend to let this slow her down. Recent Psychosocial Issues: none reported Psychiatric History: none reported Substance Abuse History: none reported  Patient / Family Perceptions, Expectations &  Goals Pt/Family understanding of illness & functional limitations: Pt/son report a good understanding of pt's conditions and limitations. Premorbid pt/family roles/activities: Pt likes to go out and about with her friends/neighbors.  She doesn't drive, so she just goes along wherever her friends are going. Anticipated changes in roles/activities/participation: Pt would like to resume as she is  able. Pt/family expectations/goals: Pt wants to get back to running around with her friends in the community.  Community Resources Express Scripts: None Premorbid Home Care/DME Agencies: None Transportation available at discharge: son Resource referrals recommended: Neuropsychology, Support group (specify) (stroke support group)  Discharge Planning Living Arrangements: Alone Support Systems: Children, Friends/neighbors Type of Residence: Other (Comment) (senior independent living) Administrator, sports: [employer], Multimedia programmer (specify) Web designer) Financial Resources: Radio broadcast assistant Screen Referred: No Living Expenses: Education officer, community Management: Patient Does the patient have any problems obtaining your medications?: No Home Management: Pt was doing this in her apartment. Patient/Family Preliminary Plans: Pt plans to go back to her independent living apartment.  Her son is working on someone to be with her from time to time and he will come after work regularly. Social Work Anticipated Follow Up Needs: HH/OP, Support Group Expected length of stay: 2 weeks  Clinical Impression CSW met with pt and then spoke with her son via telephone to introduce self and role of CSW, as well as to complete assessment.  Pt was very talkative and friendly with pt.  Pt plans to return to her apt with intermittent supervision and feels comfortable with that.  CSW explained to pt that our team recommends 24/7 supervision.  She does not feel she will have that, but is very motivated to get stronger and does not feel she will need that.  CSW to talk with pt's son about this, as well.  Pt has some DME at home from a knee injury, but CSW will assist if additional DME is recommended and to set up f/u therapy.  Son can take pt to outpt therapy with notice so that he can let employer know.  CSW co-workers, Merchandiser, retail, will continue to follow pt in this CSW's absence.    Javelle Donigan, Silvestre Mesi 07/21/2017,  1:45 PM

## 2017-07-25 NOTE — Progress Notes (Signed)
Occupational Therapy Note  Patient Details  Name: Kristi David MRN: [id number] Date of Birth: 01/20/1941  Today's Date: 07/25/2017 OT Individual Time: 1400-1430 OT Individual Time Calculation (min): 30 min   Pt denies pain Individual Therapy  Pt resting in bed with family present.  Pt transitioned to therapy gym for RUE NMR including RUE weight bearing, stool work, and AAROM in supine on mat.  Trace shoulder flexion/extension, elbow extension, and shoulder horizontal adduction/abduction noted.  Pt returned to bed upon return to room.  Family present and all needs within reach.    Kristi David, Kristi David [facility] 07/25/2017, 2:36 PM

## 2017-07-25 NOTE — Progress Notes (Signed)
[facility] PHYSICAL MEDICINE & REHABILITATION     PROGRESS NOTE    Subjective/Complaints: Doing well. No new complaints. Pain controlled  ROS: pt denies nausea, vomiting, diarrhea, cough, shortness of breath or chest pain    Objective: Vital Signs: Blood pressure (!) 141/81, pulse 75, temperature 98.1 F (36.7 C), temperature source Oral, resp. rate 17, height 5\' 7"  (1.702 m), SpO2 100 %. No results found. No results for input(s): WBC, HGB, HCT, PLT in the last 72 hours. No results for input(s): NA, K, CL, GLUCOSE, BUN, CREATININE, CALCIUM in the last 72 hours.  Invalid input(s): CO CBG (last 3)   Recent Labs  07/24/17 2207 07/25/17 0644 07/25/17 1132  GLUCAP 97 102* 94    Wt Readings from Last 3 Encounters:  07/17/17 88 kg (194 lb 0.1 oz)  01/06/16 87.1 kg (192 lb)  12/31/15 87.2 kg (192 lb 3.9 oz)    Physical Exam:  Constitutional: She appears well-developed and well-nourished.  HENT:  Head: Normocephalic and atraumatic.  Eyes: EOM are normal. Right eye exhibits no discharge. Left eye exhibits no discharge.  Neck: Normal range of motion. Neck supple. No thyromegaly present.  Cardiovascular: RRR without murmur. No JVD   Respiratory: CTA Bilaterally without wheezes or rales. Normal effort  GI: BS +, non-tender, non-distended .  Musculoskeletal: She exhibits no edema or tenderness.  Neurological: She is alert.   Speech dysarthric, right central 7 and tongue deviation Motor: LUE/LLE: 5/5 proximal to distal RUE: Shoulder abduction   2+/5 proximal to distal, elbow flex/ext 2/5, grip 2 /5  RLE: 4-/5 HF, KE and 4/5 ADF/PF.  Sensation intact to light touch on right.    Skin: Skin is warm and dry.  Psychiatric: She has a normal mood and affect. Her behavior is normal.   Assessment/Plan: 1. Right hemiparesis and functional deficits secondary to right hemiparesis which require 3+ hours per day of interdisciplinary therapy in a comprehensive inpatient rehab  setting. Physiatrist is providing close team supervision and 24 hour management of active medical problems listed below. Physiatrist and rehab team continue to assess barriers to discharge/monitor patient progress toward functional and medical goals.  Function:  Bathing Bathing position   Position: Shower  Bathing parts Body parts bathed by patient: Right arm, Left arm, Chest, Abdomen, Front perineal area, Buttocks, Right upper leg, Left upper leg, Right lower leg, Left lower leg Body parts bathed by helper: Right lower leg, Left lower leg, Back, Left arm  Bathing assist Assist Level: Touching or steadying assistance(Pt > 75%)      Upper Body Dressing/Undressing Upper body dressing   What is the patient wearing?: Pull over shirt/dress     Pull over shirt/dress - Perfomed by patient: Thread/unthread right sleeve, Thread/unthread left sleeve, Put head through opening, Pull shirt over trunk Pull over shirt/dress - Perfomed by helper: Pull shirt over trunk        Upper body assist Assist Level: Supervision or verbal cues      Lower Body Dressing/Undressing Lower body dressing   What is the patient wearing?: Underwear, Pants Underwear - Performed by patient: Thread/unthread right underwear leg, Thread/unthread left underwear leg Underwear - Performed by helper: Pull underwear up/down Pants- Performed by patient: Thread/unthread right pants leg, Thread/unthread left pants leg, Pull pants up/down, Fasten/unfasten pants Pants- Performed by helper: Thread/unthread right pants leg, Thread/unthread left pants leg         Shoes - Performed by patient: Don/doff right shoe, Don/doff left shoe Shoes - Performed by helper:  Don/doff right shoe, Don/doff left shoe          Lower body assist Assist for lower body dressing: Touching or steadying assistance (Pt > 75%)      Toileting Toileting   Toileting steps completed by patient: Adjust clothing prior to toileting, Adjust clothing after  toileting, Performs perineal hygiene Toileting steps completed by helper: Adjust clothing after toileting Toileting Assistive Devices: Grab bar or rail  Toileting assist Assist level: Touching or steadying assistance (Pt.75%)   Transfers Chair/bed transfer   Chair/bed transfer method: Ambulatory Chair/bed transfer assist level: Supervision or verbal cues Chair/bed transfer assistive device: Armrests, Hospital doctorCane     Locomotion Ambulation     Max distance: 150 Assist level: Supervision or verbal cues   Wheelchair   Type: Manual Max wheelchair distance: 75 Assist Level: Moderate assistance (Pt 50 - 74%)  Cognition Comprehension Comprehension assist level: Follows basic conversation/direction with extra time/assistive device  Expression Expression assist level: Expresses complex ideas: With extra time/assistive device  Social Interaction Social Interaction assist level: Interacts appropriately with others with medication or extra time (anti-anxiety, antidepressant).  Problem Solving Problem solving assist level: Solves basic 90% of the time/requires cueing < 10% of the time  Memory Memory assist level: Recognizes or recalls 90% of the time/requires cueing < 10% of the time   Medical Problem List and Plan: 1.  Right sided weakness and dysarthria secondary to left basal ganglia infarction secondary to small vessel disease  -continue therapies. remains motivated 2.  DVT Prophylaxis/Anticoagulation: SCDs. Monitor for any signs of DVT 3. Pain Management: Voltaren gel as needed 4. Mood: Celexa 40 mg daily 5. Neuropsych: This patient is capable of making decisions on her own behalf. 6. Skin/Wound Care: Routine skin checks 7. Fluids/Electrolytes/Nutrition: encourage PO  -replacing potassium 8. Dysphagia. Dysphagia #2 nectar liquids. Advance per speech therapy 9. Hypertension. Cozaar 100 mg daily, Toprol 25 mg daily  -remains under control 10. Hyperlipidemia. Lipitor 11. Diabetes mellitus  with peripheral neuropathy. Hemoglobin A1c 5.5. Patient diet controlled prior to admission.  -good control at present   LOS (Days) 7 A FACE TO FACE EVALUATION WAS PERFORMED  Ranelle OysterSWARTZ,Angee Gupton T, MD 07/25/2017 12:56 PM

## 2017-07-26 ENCOUNTER — Inpatient Hospital Stay (HOSPITAL_COMMUNITY): Payer: Medicare Other | Admitting: Speech Pathology

## 2017-07-26 ENCOUNTER — Inpatient Hospital Stay (HOSPITAL_COMMUNITY): Payer: Medicare Other

## 2017-07-26 ENCOUNTER — Inpatient Hospital Stay (HOSPITAL_COMMUNITY): Payer: Medicare Other | Admitting: Occupational Therapy

## 2017-07-26 ENCOUNTER — Inpatient Hospital Stay (HOSPITAL_COMMUNITY): Payer: Medicare Other | Admitting: Physical Therapy

## 2017-07-26 LAB — GLUCOSE, CAPILLARY
Glucose-Capillary: 120 mg/dL — ABNORMAL HIGH (ref 65–99)
Glucose-Capillary: 101 mg/dL — ABNORMAL HIGH (ref 65–99)
Glucose-Capillary: 102 mg/dL — ABNORMAL HIGH (ref 65–99)
Glucose-Capillary: 111 mg/dL — ABNORMAL HIGH (ref 65–99)

## 2017-07-26 NOTE — Progress Notes (Signed)
[facility] PHYSICAL MEDICINE & REHABILITATION     PROGRESS NOTE    Subjective/Complaints: Up moving with therapy. No new issues  ROS: pt denies nausea, vomiting, diarrhea, cough, shortness of breath or chest pain    Objective: Vital Signs: Blood pressure (!) 135/57, pulse 65, temperature 98.4 F (36.9 C), temperature source Oral, resp. rate 15, height 5\' 9"  (1.753 m), SpO2 99 %. No results found. No results for input(s): WBC, HGB, HCT, PLT in the last 72 hours. No results for input(s): NA, K, CL, GLUCOSE, BUN, CREATININE, CALCIUM in the last 72 hours.  Invalid input(s): CO CBG (last 3)   Recent Labs  07/25/17 1652 07/25/17 2107 07/26/17 0623  GLUCAP 102* 108* 120*    Wt Readings from Last 3 Encounters:  07/17/17 88 kg (194 lb 0.1 oz)  01/06/16 87.1 kg (192 lb)  12/31/15 87.2 kg (192 lb 3.9 oz)    Physical Exam:  Constitutional: She appears well-developed and well-nourished.  HENT:  Head: Normocephalic and atraumatic.  Eyes: EOMI.  Neck: Normal range of motion. Neck supple. No thyromegaly present.  Cardiovascular: RRR without murmur. No JVD  Respiratory:CTA Bilaterally without wheezes or rales. Normal effort  GI: BS +, non-tender, non-distended .  Musculoskeletal: She exhibits no edema or tenderness.  Neurological: She is alert.   Speech dysarthric, right central 7 and tongue deviation still present Motor: LUE/LLE: 5/5 proximal to distal RUE: Shoulder abduction   2+/5 proximal to distal, elbow flex/ext 2/5, grip 2 /5 ---no changes RLE: 4-/5 HF, KE and 4/5 ADF/PF. Balance with gait improving Sensation intact to light touch on right.    Skin: Skin is warm and dry.  Psychiatric: She has a normal mood and affect. Her behavior is normal.   Assessment/Plan: 1. Right hemiparesis and functional deficits secondary to right hemiparesis which require 3+ hours per day of interdisciplinary therapy in a comprehensive inpatient rehab setting. Physiatrist is providing close  team supervision and 24 hour management of active medical problems listed below. Physiatrist and rehab team continue to assess barriers to discharge/monitor patient progress toward functional and medical goals.  Function:  Bathing Bathing position   Position: Shower  Bathing parts Body parts bathed by patient: Right arm, Left arm, Chest, Abdomen, Front perineal area, Buttocks, Right upper leg, Left upper leg, Right lower leg, Left lower leg Body parts bathed by helper: Right lower leg, Left lower leg, Back, Left arm  Bathing assist Assist Level: Touching or steadying assistance(Pt > 75%)      Upper Body Dressing/Undressing Upper body dressing   What is the patient wearing?: Pull over shirt/dress     Pull over shirt/dress - Perfomed by patient: Thread/unthread right sleeve, Thread/unthread left sleeve, Put head through opening, Pull shirt over trunk Pull over shirt/dress - Perfomed by helper: Pull shirt over trunk        Upper body assist Assist Level: Supervision or verbal cues      Lower Body Dressing/Undressing Lower body dressing   What is the patient wearing?: Underwear, Pants Underwear - Performed by patient: Thread/unthread right underwear leg, Thread/unthread left underwear leg Underwear - Performed by helper: Pull underwear up/down Pants- Performed by patient: Thread/unthread right pants leg, Thread/unthread left pants leg, Pull pants up/down, Fasten/unfasten pants Pants- Performed by helper: Thread/unthread right pants leg, Thread/unthread left pants leg         Shoes - Performed by patient: Don/doff right shoe, Don/doff left shoe Shoes - Performed by helper: Don/doff right shoe, Don/doff left shoe  Lower body assist Assist for lower body dressing: Touching or steadying assistance (Pt > 75%)      Toileting Toileting   Toileting steps completed by patient: Adjust clothing prior to toileting, Adjust clothing after toileting, Performs perineal  hygiene Toileting steps completed by helper: Adjust clothing after toileting Toileting Assistive Devices: Grab bar or rail  Toileting assist Assist level: More than reasonable time   Transfers Chair/bed transfer   Chair/bed transfer method: Ambulatory Chair/bed transfer assist level: Supervision or verbal cues Chair/bed transfer assistive device: Armrests, Hospital doctorCane     Locomotion Ambulation     Max distance: 150 Assist level: Supervision or verbal cues   Wheelchair   Type: Manual Max wheelchair distance: 75 Assist Level: Moderate assistance (Pt 50 - 74%)  Cognition Comprehension Comprehension assist level: Follows basic conversation/direction with extra time/assistive device  Expression Expression assist level: Expresses complex ideas: With extra time/assistive device  Social Interaction Social Interaction assist level: Interacts appropriately with others with medication or extra time (anti-anxiety, antidepressant).  Problem Solving Problem solving assist level: Solves basic 90% of the time/requires cueing < 10% of the time  Memory Memory assist level: Recognizes or recalls 90% of the time/requires cueing < 10% of the time   Medical Problem List and Plan: 1.  Right sided weakness and dysarthria secondary to left basal ganglia infarction secondary to small vessel disease  -continue therapies. remains motivated  -team conference today 2.  DVT Prophylaxis/Anticoagulation: SCDs. Monitor for any signs of DVT 3. Pain Management: Voltaren gel as needed 4. Mood: Celexa 40 mg daily 5. Neuropsych: This patient is capable of making decisions on her own behalf. 6. Skin/Wound Care: Routine skin checks 7. Fluids/Electrolytes/Nutrition: encourage PO  -replacing potassium 8. Dysphagia. Dysphagia #2 nectar liquids. Advance per speech therapy 9. Hypertension. Cozaar 100 mg daily, Toprol 25 mg daily  -bp controlled 10. Hyperlipidemia. Lipitor 11. Diabetes mellitus with peripheral neuropathy.  Hemoglobin A1c 5.5. Patient diet controlled prior to admission.  -good control continues   LOS (Days) 8 A FACE TO FACE EVALUATION WAS PERFORMED  Ranelle OysterSWARTZ,Eesha Schmaltz T, MD 07/26/2017 9:11 AM

## 2017-07-26 NOTE — Progress Notes (Signed)
Occupational Therapy Weekly Progress Note  Patient Details  Name: Kristi David MRN: [id number] Date of Birth: Feb 16, 1941  Beginning of progress report period: July 19, 2017 End of progress report period: July 26, 2017  Patient has met 4 of 4 short term goals.  Pt has made steady progress with BADLs since admission.  Pt amb with SPC to access bathroom and for all functional transfers.  Pt incorporates hemi bathing/dressing techniques and currently completes bathing/dressing tasks at supervision level with occasional steady A for standing balance.  Pt initiates use of RUE but unable to engage functionally.  Family has not been present for therapy sessions.   Patient continues to demonstrate the following deficits: muscle weakness and muscle paralysis, unbalanced muscle activation and decreased standing balance, hemiplegia and decreased balance strategies and therefore will continue to benefit from skilled OT intervention to enhance overall performance with BADL.  Patient progressing toward long term goals..  Continue plan of care.  OT Short Term Goals Week 1:  OT Short Term Goal 1 (Week 1): pt will bathe with S. OT Short Term Goal 1 - Progress (Week 1): Met OT Short Term Goal 2 (Week 1): Pt will actively use RUE as a stabilizing A during dressing tasks.  OT Short Term Goal 2 - Progress (Week 1): Met OT Short Term Goal 3 (Week 1): Pt will don shirt with S. OT Short Term Goal 3 - Progress (Week 1): Met OT Short Term Goal 4 (Week 1): Pt will don pants with S. OT Short Term Goal 4 - Progress (Week 1): Met Week 2:  OT Short Term Goal 1 (Week 2): STG=LTG secondary to ELOS       Therapy Documentation Precautions:  Precautions Precautions: Fall Restrictions Weight Bearing Restrictions: No  See Function Navigator for Current Functional Status.   Therapy/Group: Individual Therapy  Leroy Libman 07/26/2017, 6:48 AM

## 2017-07-26 NOTE — Progress Notes (Signed)
Speech Language Pathology Weekly Progress and Session Note  Patient Details  Name: Kristi David MRN: [id number] Date of Birth: 12-Jun-1941  Beginning of progress report period:  July 19, 2017  End of progress report period: July 26, 2017   Today's Date: 07/26/2017 SLP Individual Time: 1020-1045 SLP Individual Time Calculation (min): 25 min  Short Term Goals: Week 1: SLP Short Term Goal 1 (Week 1): Pt will consume dys 2 and nectar thick liquids with Min overt s/s aspiration and Min A verbal cues for use of swallowing compensatory strategies. SLP Short Term Goal 1 - Progress (Week 1): Met SLP Short Term Goal 2 (Week 1): Pt will tolerate trials of thin liquids with overt s/s aspiration in order to assess readiness for instrumental swallow study. SLP Short Term Goal 2 - Progress (Week 1): Progressing toward goal SLP Short Term Goal 3 (Week 1): Pt will tolerate trials of dys 3 with Min overt s/s aspritation and Min A verbal cues to utilize swallow compensatory strategies to clear oral residue. SLP Short Term Goal 3 - Progress (Week 1): Other (comment) (not addressed this reporting period due to focus on advancing thin liquids) SLP Short Term Goal 4 (Week 1): Pt will demonstarte functional probelm solving for basic tasks with Mod A verbal cues. SLP Short Term Goal 4 - Progress (Week 1): Met SLP Short Term Goal 5 (Week 1): Pt will recall daily infromation with Mod A verbal cues for use of external aids. SLP Short Term Goal 5 - Progress (Week 1): Progressing toward goal SLP Short Term Goal 6 (Week 1): Pt will utilize speech intellgibility strategies at the sentence level with Mod A verbal cues to achieve 70% intellgibility.  SLP Short Term Goal 6 - Progress (Week 1): Met    New Short Term Goals: Week 2: SLP Short Term Goal 1 (Week 2): Pt will consume dys 2 and nectar thick liquids with Min overt s/s aspiration and supervision verbal cues for use of swallowing compensatory strategies. SLP  Short Term Goal 2 (Week 2): Pt will tolerate trials of thin liquids with overt s/s aspiration in order to assess readiness for instrumental swallow study. SLP Short Term Goal 3 (Week 2): Pt will tolerate trials of dys 3 with Min overt s/s aspritation and Min A verbal cues to utilize swallow compensatory strategies to clear oral residue. SLP Short Term Goal 4 (Week 2): Pt will demonstarte functional probelm solving for basic tasks with Min A verbal cues. SLP Short Term Goal 5 (Week 2): Pt will recall daily infromation with Mod A verbal cues for use of external aids. SLP Short Term Goal 6 (Week 2): Pt will utilize speech intellgibility strategies at the sentence level with supervision verbal cues to achieve 70% intellgibility.   Weekly Progress Updates:  Pt has made functional gains this reporting period and has met 3 out of 4 targeted goals.  Pt is demonstrating improved use of swallowing precautions during presentations of her currently prescribed diet and trials of thin liquids to continue working towards diet progression.  Pt has also demonstrated improved speech intelligibility and functional problem solving.  Overall pt is a min assist level for basic to mildly complex cognitive tasks.  As a result, pt would continue to benefit from skilled ST while inpatient in order to maximize functional independence and reduce burden of care prior to discharge.  Anticipate that pt will need 24/7 supervision at discharge in addition to [place] follow up at next level of care.  Intensity: Minumum of 1-2 x/day, 30 to 90 minutes Frequency: 3 to 5 out of 7 days Duration/Length of Stay: 12-14 days Treatment/Interventions: Cognitive remediation/compensation;Cueing hierarchy;Functional tasks;Dysphagia/aspiration precaution training   Daily Session  Skilled Therapeutic Interventions: Pt was seen for skilled ST targeting goals for speech and swallowing.  Pt consumed therapeutic trials of thin liquids per the water  protocol with min verbal cues for effective use of chin tuck.  When chin tuck was utilized appropriately, pt demonstrated no overt s/s of aspiration; however, when chin tuck was not complete pt demonstrated immediate throat clearing.  SLP also facilitated the session with a categorical naming task to address speech intelligibility.  Pt needed supervision verbal cues to slow rate and increase vocal intensity to achieve intelligibility at the sentence level.  Pt was left in recliner at the end of today's therapy session with call bell within reach.  Goals updated on this date to reflect current progress and plan of care.       Function:   Eating Eating   Modified Consistency Diet: Yes Eating Assist Level: Supervision or verbal cues           Cognition Comprehension Comprehension assist level: Follows basic conversation/direction with extra time/assistive device  Expression   Expression assist level: Expresses basic 90% of the time/requires cueing < 10% of the time.  Social Interaction Social Interaction assist level: Interacts appropriately with others with medication or extra time (anti-anxiety, antidepressant).  Problem Solving Problem solving assist level: Solves basic 90% of the time/requires cueing < 10% of the time  Memory Memory assist level: Recognizes or recalls 75 - 89% of the time/requires cueing 10 - 24% of the time   General    Pain Pain Assessment Pain Assessment: No/denies pain   Therapy/Group: Individual Therapy  Wil Slape, Selinda Orion 07/26/2017, 12:51 PM

## 2017-07-26 NOTE — Progress Notes (Signed)
Occupational Therapy Session Note  Patient Details  Name: Kristi David MRN: [id number] Date of Birth: 08/22/1941  Today's Date: 07/26/2017 OT Individual Time: 0900-1000 OT Individual Time Calculation (min): 60 min    Short Term Goals: Week 2:  OT Short Term Goal 1 (Week 2): STG=LTG secondary to ELOS  Skilled Therapeutic Interventions/Progress Updates:    Pt engaged in BADL retraining including bathing at shower level and dressing with sit<>stand from seat.  Pt completed bathing/dressing tasks without physical assistance and min verbal cues for safety awareness.  Pt initiates use of RUE as gross stabilizer but requires max A.  Pt amb in room without AD to gather clothing and stand at sink for grooming tasks.  Pt returned to recliner with all needs within reach.  Focus on BADL retraining, standing balance, functional amb, and safety awareness to increase independence with BADLs.   Therapy Documentation Precautions:  Precautions Precautions: Fall Restrictions Weight Bearing Restrictions: No  Pain: Pain Assessment Pain Assessment: No/denies pain Pain Score: 0-No pain  See Function Navigator for Current Functional Status.   Therapy/Group: Individual Therapy  Kristi David, Kristi David 07/26/2017, 12:14 PM

## 2017-07-26 NOTE — Plan of Care (Signed)
Problem: RH Toileting Goal: LTG Patient will perform toileting w/assist, cues/equip (OT) LTG: Patient will perform toiletiing (clothes management/hygiene) with assist, with/without cues using equipment (OT)  Upgraded due to progress  Problem: RH Toilet Transfers Goal: LTG Patient will perform toilet transfers w/assist (OT) LTG: Patient will perform toilet transfers with assist, with/without cues using equipment (OT)  Upgraded due to progress     

## 2017-07-26 NOTE — Progress Notes (Signed)
Physical Therapy Session Note  Patient Details  Name: Jossie NgHettie Goonan MRN: [id number] Date of Birth: 12/14/1940  Today's Date: 07/26/2017 PT Individual Time: 1530-1559 PT Individual Time Calculation (min): 29 min   Short Term Goals: Week 2:  PT Short Term Goal 1 (Week 2): =LTG due to estimated LOS  Skilled Therapeutic Interventions/Progress Updates:    Pt supine in bed upon PT arrival, agreeable to therapy tx and denies pain. Pt transferred from supine to sitting EOB and donned shoes with supervision. Pt ambulated x 80 ft each way from room<>dayroom using cane and min assist. Pt standing at high table to complete peg board puzzle working on standing balance. Pt used cybex kinetron to perform x 10 sit<>stands without UE support and focus on symmetric weightbearing and balance. Pt standing on cybex kinetron performed marching without UE support working on knee control and dynamic balance. Pt left supine in bed at end of session with needs in reach.   Therapy Documentation Precautions:  Precautions Precautions: Fall Restrictions Weight Bearing Restrictions: No   See Function Navigator for Current Functional Status.   Therapy/Group: Individual Therapy  Cresenciano GenreEmily van Schagen, PT, DPT 07/26/2017, 3:55 PM

## 2017-07-26 NOTE — Progress Notes (Signed)
Physical Therapy Weekly Progress Note  Patient Details  Name: Kristi David MRN: [id number] Date of Birth: 1940-10-15  Beginning of progress report period: July 19, 2017 End of progress report period: July 26, 2017  Today's Date: 07/26/2017 PT Individual Time: 0815-0900 PT Individual Time Calculation (min): 45 min   Patient has met 4 of 4 short term goals.  Pt performing bed mobility modI, transfers with S and gait with no AD and with SPC with min guard/close S. Therapy sessions with focus on dynamic balance, RLE NMR and strengthening, and activity tolerance. Patient is highly motivated to improve balance and independence, and works hard during therapy sessions. Pt continues to be limited by functional use of RUE for daily activities and continues to demonstrate occasional LOBs with dynamic balance activities, resulting on continued recommendation that pt d/c with 24/7 S.   Patient continues to demonstrate the following deficits muscle weakness, decreased cardiorespiratoy endurance, impaired timing and sequencing, unbalanced muscle activation and decreased coordination and decreased standing balance, decreased postural control, hemiplegia and decreased balance strategies and therefore will continue to benefit from skilled PT intervention to increase functional independence with mobility.  Patient progressing toward long term goals..  Continue plan of care.  PT Short Term Goals Week 1:  PT Short Term Goal 1 (Week 1): Pt will perform bed mobility modI PT Short Term Goal 1 - Progress (Week 1): Met PT Short Term Goal 2 (Week 1): Pt will perform transfers with consistent S PT Short Term Goal 2 - Progress (Week 1): Met PT Short Term Goal 3 (Week 1): Pt will ambulate 150' with no AD and S PT Short Term Goal 3 - Progress (Week 1): Met PT Short Term Goal 4 (Week 1): Pt will ascend/descend 12 stairs with min guard PT Short Term Goal 4 - Progress (Week 1): Met Week 2:  PT Short Term Goal 1 (Week  2): =LTG due to estimated LOS  Skilled Therapeutic Interventions/Progress Updates: Pt received seated in bed, finishing breakfast and requests to finish eating before participating; missed 15 min PT. Upon therapists return pt agreeable to treatment, denies pain. Pt donned pants with setupA and increased time. Gait in/out of bathroom no AD with close S. Pt performs clothing management and hygiene modI. Gait to gym with SPC and S x150'. Performed ascent/descent 12 6-inch steps with L handrail and close S; min cues for ascent with LLE, descent with RLE. Educated pt on difference between leading with RLE for strengthening/NMR while in therapy, and leading with LLE for safety when with family at home or in community. Lateral step ups 1x15 BLE on 6" step. Sideways braiding in parallel bars with mod/max multimodal cueing for sequencing and technique; improved performance on second trial. Obstacle course with SPC and min guard weaving around cones, on/off 2" step, over weighted bars, onto airex foam pad. Returned to room with gait as above; remained seated in recliner with all needs in reach at completion of session.      Therapy Documentation Precautions:  Precautions Precautions: Fall Restrictions Weight Bearing Restrictions: No General: PT Amount of Missed Time (min): 15 Minutes PT Missed Treatment Reason: Other (Comment) (breakfast) Pain: Pain Assessment Pain Score: 0-No pain Faces Pain Scale: Hurts a little bit Pain Type: Acute pain Pain Location: Arm Pain Orientation: Left Pain Descriptors / Indicators: Discomfort Pain Intervention(s): Medication (See eMAR)   See Function Navigator for Current Functional Status.  Therapy/Group: Individual Therapy  Luberta Mutter 07/26/2017, 8:58 AM

## 2017-07-26 NOTE — Progress Notes (Signed)
Occupational Therapy Session Note  Patient Details  Name: Kristi NgHettie David MRN: [id number] Date of Birth: 03/06/1941  Today's Date: 07/26/2017 OT Individual Time: 1500-1530 OT Individual Time Calculation (min): 30 min    Short Term Goals: Week 2:  OT Short Term Goal 1 (Week 2): STG=LTG secondary to ELOS  Skilled Therapeutic Interventions/Progress Updates:    Upon entering the room, pt supine in bed with family present in the room. Pt agreeable to OT intervention and with no c/o pain. Pt performed bed mobility with supervision. Pt ambulating with use of SPC and steady assistance 75' into day room. Pt engaged in R UE NMR with AAROM and against gravity. Pt with new flex/extension of R thumb and wrist. OT educated pt on self ROM for R UE and pt returning demonstrations with min verbal cues. Pt returning to room in same manner as above. Call bell and all needed items within reach upon exiting the room.   Therapy Documentation Precautions:  Precautions Precautions: Fall Restrictions Weight Bearing Restrictions: No General:   Vital Signs: Therapy Vitals Temp: 98.1 F (36.7 C) Temp Source: Oral Pulse Rate: 63 Resp: 17 BP: (!) 131/51 Patient Position (if appropriate): Lying Oxygen Therapy SpO2: 98 % O2 Device: Not Delivered Pain:   ADL: ADL ADL Comments: refer to functional navigator Vision   Perception    Praxis   Exercises:   Other Treatments:    See Function Navigator for Current Functional Status.   Therapy/Group: Individual Therapy  Alen BleacherBradsher, Cartier Washko P 07/26/2017, 5:31 PM

## 2017-07-27 ENCOUNTER — Inpatient Hospital Stay (HOSPITAL_COMMUNITY): Payer: Medicare Other | Admitting: Physical Therapy

## 2017-07-27 ENCOUNTER — Inpatient Hospital Stay (HOSPITAL_COMMUNITY): Payer: Medicare Other | Admitting: Occupational Therapy

## 2017-07-27 ENCOUNTER — Inpatient Hospital Stay (HOSPITAL_COMMUNITY): Payer: Medicare Other

## 2017-07-27 ENCOUNTER — Inpatient Hospital Stay (HOSPITAL_COMMUNITY): Payer: Medicare Other | Admitting: Speech Pathology

## 2017-07-27 DIAGNOSIS — E119 Type 2 diabetes mellitus without complications: Secondary | ICD-10-CM

## 2017-07-27 DIAGNOSIS — R531 Weakness: Secondary | ICD-10-CM

## 2017-07-27 LAB — GLUCOSE, CAPILLARY
GLUCOSE-CAPILLARY: 109 mg/dL — AB (ref 65–99)
GLUCOSE-CAPILLARY: 95 mg/dL (ref 65–99)
Glucose-Capillary: 97 mg/dL (ref 65–99)
Glucose-Capillary: 92 mg/dL (ref 65–99)

## 2017-07-27 NOTE — Patient Care Conference (Signed)
Inpatient RehabilitationTeam Conference and Plan of Care Update Date: 07/26/2017   Time: 2:15 PM    Patient Name: Kristi NgHettie Verge      Medical Record Number: [id number]  Date of Birth: 02/06/1941 Sex: Female         Room/Bed: 4W13C/4W13C-01 Payor Info: Payor: MEDICARE / Plan: MEDICARE PART A AND B / Product Type: *No Product type* /    Admitting Diagnosis: L CVA  Admit Date/Time:  07/18/2017  4:46 PM Admission Comments: No comment available   Primary Diagnosis:  Left basal ganglia embolic stroke [facility](HCC) Principal Problem: Left basal ganglia embolic stroke [facility]HCC)  Patient Active Problem List   Diagnosis Date Noted  . Left basal ganglia embolic stroke (HCC) 07/18/2017  . Facial droop   . Right sided weakness   . Diabetes mellitus (HCC)   . Diastolic dysfunction   . Dysphagia, post-stroke   . Dysarthria, post-stroke   . Hypokalemia   . Acute blood loss anemia   . Hyperlipidemia   . Stroke (HCC) 07/16/2017  . Hypertension 07/16/2017  . Arthritis of right hand 01/11/2017  . Osteoarthritis of right knee 01/06/2016  . Status post right partial knee replacement 01/06/2016    Expected Discharge Date: Expected Discharge Date: 07/30/17  Team Members Present: Physician leading conference: Dr. Faith RogueZachary Swartz Social Worker Present: Amada JupiterLucy Mescal Flinchbaugh, LCSW Nurse Present: Kennon PortelaJeanna Hicks, RN PT Present: Alyson ReedyElizabeth Tygielski, PT OT Present: Ardis Rowanom Lanier, COTA;Jennifer Katrinka BlazingSmith, OT SLP Present: Colin BentonMadison Cratch, SLP PPS Coordinator present : Tora DuckMarie Noel, RN, CRRN     Current Status/Progress Goal Weekly Team Focus  Medical   continued Right hemiparesis due to CVA. bp controlled  maximize functional mobility  nutrition, bp control   Bowel/Bladder   Continent of B&B and regular of bowel.  Remain continent and regular.  Monitor every shift and PRN and assist as needed.   Swallow/Nutrition/ Hydration   Dys 2 and nectar thick liquids, trials of thin, carryover swallow strategies Min A  Supervision  water  protocol, order repeat MBS, trials of Dys 3   ADL's   bathing-steady A; LB dressing-steady A; UB dressing-supervsion; grooming-supervision; functional transfers-close supervision; initiating use of RUE but currently max A as stabilizer  supervision overall  RUE NMR, standing balance, BADL retraining, functional amb with SPC   Mobility   modI bed mobility, S transfers, min guard gait and stairs  modI bed mobility and transfers, S gait and stairs  dynamic standing balance, LE strength/ROM/coordination, activity tolerance   Communication   Speech intelligibility sentence level, Min A  Supervision  carryover strategies    Safety/Cognition/ Behavioral Observations  Min A  Supervision  mildly complex problem solving, recall    Pain   No c/o pain.  <2.  Assess and treat every shift and as needed.   Skin   No complications.  Free of infection or breakdown.  Assess q shift and as needed.    Rehab Goals Patient on target to meet rehab goals: Yes *See Care Plan and progress notes for long and short-term goals.     Barriers to Discharge  Current Status/Progress Possible Resolutions Date Resolved   Physician    Medical stability        treat bp, stroke related symptoms      Nursing                  PT                    OT  SLP                SW                Discharge Planning/Teaching Needs:  Pt to d/c home to her independent apartment with intermittent support of family and friends.      Team Discussion:  Doing very well overall. Showing some movement in tight shoulder.  Currently supervision standing balance.  Plan to upgrade amb goals to mod independent.  Dys 2, nectar but plan to repeat MBS on Thursday and hope to upgrade.  On target for Sat d/c.  Revisions to Treatment Plan:  None    Continued Need for Acute Rehabilitation Level of Care: The patient requires daily medical management by a physician with specialized training in physical medicine and  rehabilitation for the following conditions: Daily direction of a multidisciplinary physical rehabilitation program to ensure safe treatment while eliciting the highest outcome that is of practical value to the patient.: Yes Daily medical management of patient stability for increased activity during participation in an intensive rehabilitation regime.: Yes Daily analysis of laboratory values and/or radiology reports with any subsequent need for medication adjustment of medical intervention for : Neurological problems  Jarick Harkins 07/27/2017, 12:34 PM

## 2017-07-27 NOTE — Progress Notes (Signed)
Social Work Patient ID: Kristi David, female   DOB: Apr 11, 1941, 76 y.o.   MRN: [id number]   Met with pt and son yesterday afternoon to review team conference.  Explained to both that team feels pt is on track to meet mod ind goals by Sat d/c.  Also, informed of plan to repeat MBS on Thursday.  Son states that he has concerns of pt's readiness for d/c by Sat and asks about process to appeal d/c date under Medicare.  When questioned him on specific concerns as team feels pt on track for mod ind goals.  He states, "well I guess it's up to her anyway" and seems to drop his concern.   I did follow up with pt today to ask her if she had any concerns about d/c and she denies any.  She reports that her son "is just worried", however, she feels she has enough support at her apartment and will manage well.  Will continue to monitor progress.    Lenoir Facchini, LCSW

## 2017-07-27 NOTE — Progress Notes (Signed)
Physical Therapy Session Note  Patient Details  Name: Kristi NgHettie David MRN: [id number] Date of Birth: 10/02/1940  Today's Date: 07/27/2017 PT Individual Time: 0900-1030 PT Individual Time Calculation (min): 90 min   Short Term Goals: Week 2:  PT Short Term Goal 1 (Week 2): =LTG due to estimated LOS  Skilled Therapeutic Interventions/Progress Updates: Pt received supine in bed, c/o pain as below and agreeable to treatment. Bed mobility and donning shoes modi. Gait [facility]. [facility]. Nustep x7 min with BLE, LUE for aerobic endurance. 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 Pt performed gait in community environment with SPC and S, multiple sit <>stand, and focus on speech intelligibility and compensatory techniques with LUE. Returned to room totalA; remained seated in recliner at end of session, all needs in reach.      Therapy Documentation Precautions:  Precautions Precautions: Fall Restrictions Weight Bearing Restrictions: No Pain: Pain Assessment Pain Assessment: 0-10 Pain Score: 3  Pain Type: Acute pain Pain Location: Arm Pain Orientation: Left Pain Descriptors / Indicators: Aching Pain Frequency: Intermittent Pain Onset: On-going Patients Stated Pain Goal: 2 Pain Intervention(s): Medication (See eMAR) Multiple Pain Sites: No   See Function Navigator for Current Functional Status.   Therapy/Group: Individual Therapy  Vista Lawmanlizabeth J Tygielski 07/27/2017, 12:52 PM

## 2017-07-27 NOTE — Progress Notes (Signed)
Refusing SCD's again tonight.

## 2017-07-27 NOTE — Progress Notes (Signed)
Occupational Therapy Session Note  Patient Details  Name: Kristi David MRN: [id number] Date of Birth: 08/16/1941  Today's Date: 07/27/2017 OT Individual Time: [id number] OT Individual Time Calculation (min): 41 min    Short Term Goals: Week 2:  OT Short Term Goal 1 (Week 2): STG=LTG secondary to ELOS  Skilled Therapeutic Interventions/Progress Updates:    Upon entering the room, pt supine in bed but agreeable to OT intervention. Pt with no c/o pain this session. Pt performed bed mobility with supervision and supervision for transfer into wheelchair from bed without use of AD. OT assisted pt to ADL apartment via wheelchair for time management. Session with focus on kitchen mobility task, balance, safety, and education regarding kitchen set up. Pt ambulating with SPC into kitchen she reports as being similar to her own. Pt unable to manage cane this session as she drops it multiple times. OT suggests pt rest it against counter and walk sideways up and down counter top to move items from one location to another. Pt moving in small space without SPC and close supervision. OT discussed and provided education to pt regarding opening items with use of L hand and asking for assistance from other when needed. Pt ambulating back [facility] and supervision - steady assistance secondary to fatigue. Pt seated in recliner chair and given thickened liquids per pt request. Pt given min cues for swallow strategies this session. Pt remained in recliner chair with call bell and all needed items within reach upon exiting the room.   Therapy Documentation Precautions:  Precautions Precautions: Fall Restrictions Weight Bearing Restrictions: No General:   Vital Signs: Therapy Vitals Temp: (!) 97.2 F (36.2 C) Temp Source: Oral Pulse Rate: 70 Resp: 18 BP: 136/61 Patient Position (if appropriate): Sitting Oxygen Therapy SpO2: 98 % O2 Device: Not Delivered Pain:   ADL: ADL ADL Comments: refer to  functional navigator  See Function Navigator for Current Functional Status.   Therapy/Group: Individual Therapy  Alen BleacherBradsher, Nadirah Socorro P 07/27/2017, 4:54 PM

## 2017-07-27 NOTE — Progress Notes (Signed)
Speech Language Pathology Daily Session Note  Patient Details  Name: Kristi David MRN: [id number] Date of Birth: 03/01/1941  Today's Date: 07/27/2017 SLP Individual Time: 1100-1130 SLP Individual Time Calculation (min): 30 min  Short Term Goals: Week 2: SLP Short Term Goal 1 (Week 2): Pt will consume dys 2 and nectar thick liquids with Min overt s/s aspiration and supervision verbal cues for use of swallowing compensatory strategies. SLP Short Term Goal 2 (Week 2): Pt will tolerate trials of thin liquids with overt s/s aspiration in order to assess readiness for instrumental swallow study. SLP Short Term Goal 3 (Week 2): Pt will tolerate trials of dys 3 with Min overt s/s aspritation and Min A verbal cues to utilize swallow compensatory strategies to clear oral residue. SLP Short Term Goal 4 (Week 2): Pt will demonstarte functional probelm solving for basic tasks with Min A verbal cues. SLP Short Term Goal 5 (Week 2): Pt will recall daily infromation with Mod A verbal cues for use of external aids. SLP Short Term Goal 6 (Week 2): Pt will utilize speech intellgibility strategies at the sentence level with supervision verbal cues to achieve 70% intellgibility.   Skilled Therapeutic Interventions: Skilled treatment session focused on dysphagia and speech communication. SLP facilitated session by providing skilled observation of pt consuming thin liquids via cup. Pt with no overt s/s of aspiration with consumption with scheduled MBS on 07/28/17. Pt required Min A to increase intelligibility at the sentence level to achieve ~ 80% accuracy. Pt was left in recliner with all needs within reach. Continue per current plan.     Function:  Eating Eating   Modified Consistency Diet: No Eating Assist Level: Set up assist for;Supervision or verbal cues   Eating Set Up Assist For: Opening containers       Cognition Comprehension Comprehension assist level: Follows basic conversation/direction with no  assist  Expression   Expression assist level: Expresses basic needs/ideas: With extra time/assistive device  Social Interaction Social Interaction assist level: Interacts appropriately 90% of the time - Needs monitoring or encouragement for participation or interaction.  Problem Solving Problem solving assist level: Solves basic 90% of the time/requires cueing < 10% of the time  Memory Memory assist level: Recognizes or recalls 75 - 89% of the time/requires cueing 10 - 24% of the time    Pain Pain Assessment Pain Score: 3   Therapy/Group: Individual Therapy  Vernia Teem 07/27/2017, 3:20 PM

## 2017-07-27 NOTE — Progress Notes (Signed)
Kristi David PHYSICAL MEDICINE & REHABILITATION     PROGRESS NOTE    Subjective/Complaints: Up moving with therapy. No new issues  ROS: pt denies nausea, vomiting, diarrhea, cough, shortness of breath or chest pain    Objective: Vital Signs: Blood pressure 140/67, pulse 66, temperature 98.2 F (36.8 C), temperature source Oral, resp. rate 18, height 5\' 9"  (1.753 m), SpO2 99 %. No results found. No results for input(s): WBC, HGB, HCT, PLT in the last 72 hours. No results for input(s): NA, K, CL, GLUCOSE, BUN, CREATININE, CALCIUM in the last 72 hours.  Invalid input(s): CO CBG (last 3)   Recent Labs  07/26/17 1639 07/26/17 2100 07/27/17 0639  GLUCAP 102* 111* 97    Wt Readings from Last 3 Encounters:  07/17/17 88 kg (194 lb 0.1 oz)  01/06/16 87.1 kg (192 lb)  12/31/15 87.2 kg (192 lb 3.9 oz)    Physical Exam:  Constitutional: She appears well-developed and well-nourished.  HENT:  Head: Normocephalic and atraumatic.  Eyes: EOMI.  Neck: Normal range of motion. Neck supple. No thyromegaly present.  Cardiovascular: RRR without murmur. No JVD   Respiratory: CTA Bilaterally without wheezes or rales. Normal effort   GI: BS +, non-tender, non-distended .  Musculoskeletal: She exhibits no edema or tenderness.  Neurological: She is alert.   Speech dysarthric, voice soft. right central 7 and tongue deviation still present Motor: LUE/LLE: 5/5 proximal to distal RUE: Shoulder abduction   2+/5 proximal to distal, elbow flex/ext 2/5, grip 2 /5 ---perhaps a little more shoulder movement RLE: 4-/5 HF, KE and 4/5 ADF/PF.   Sensation intact to light touch on right.    Skin: Skin is warm and dry.  Psychiatric: She has a normal mood and affect. Her behavior is normal.   Assessment/Plan: 1. Right hemiparesis and functional deficits secondary to right hemiparesis which require 3+ hours per day of interdisciplinary therapy in a comprehensive inpatient rehab setting. Physiatrist is  providing close team supervision and 24 hour management of active medical problems listed below. Physiatrist and rehab team continue to assess barriers to discharge/monitor patient progress toward functional and medical goals.  Function:  Bathing Bathing position   Position: Shower  Bathing parts Body parts bathed by patient: Right arm, Left arm, Chest, Abdomen, Front perineal area, Buttocks, Right upper leg, Left upper leg, Right lower leg, Left lower leg Body parts bathed by helper: Right lower leg, Left lower leg, Back, Left arm  Bathing assist Assist Level: Supervision or verbal cues      Upper Body Dressing/Undressing Upper body dressing   What is the patient wearing?: Pull over shirt/dress     Pull over shirt/dress - Perfomed by patient: Thread/unthread right sleeve, Thread/unthread left sleeve, Put head through opening, Pull shirt over trunk Pull over shirt/dress - Perfomed by helper: Pull shirt over trunk        Upper body assist Assist Level: Supervision or verbal cues      Lower Body Dressing/Undressing Lower body dressing   What is the patient wearing?: Underwear, Pants, Shoes Underwear - Performed by patient: Thread/unthread right underwear leg, Thread/unthread left underwear leg, Pull underwear up/down Underwear - Performed by helper: Pull underwear up/down Pants- Performed by patient: Thread/unthread right pants leg, Thread/unthread left pants leg, Pull pants up/down, Fasten/unfasten pants Pants- Performed by helper: Thread/unthread right pants leg, Thread/unthread left pants leg         Shoes - Performed by patient: Don/doff right shoe, Don/doff left shoe Shoes - Performed by helper:  Don/doff right shoe, Don/doff left shoe          Lower body assist Assist for lower body dressing: Supervision or verbal cues      Toileting Toileting   Toileting steps completed by patient: Adjust clothing prior to toileting, Performs perineal hygiene, Adjust clothing after  toileting Toileting steps completed by helper: Adjust clothing after toileting Toileting Assistive Devices: Grab bar or rail  Toileting assist Assist level: Supervision or verbal cues   Transfers Chair/bed transfer   Chair/bed transfer method: Ambulatory Chair/bed transfer assist level: Supervision or verbal cues Chair/bed transfer assistive device: Armrests, Cane     Locomotion Ambulation     Max distance: 75 Assist level: Touching or steadying assistance (Pt > 75%)   Wheelchair   Type: Manual Max wheelchair distance: 75 Assist Level: Moderate assistance (Pt 50 - 74%)  Cognition Comprehension Comprehension assist level: Follows basic conversation/direction with extra time/assistive device  Expression Expression assist level: Expresses basic 90% of the time/requires cueing < 10% of the time.  Social Interaction Social Interaction assist level: Interacts appropriately with others with medication or extra time (anti-anxiety, antidepressant).  Problem Solving Problem solving assist level: Solves basic 90% of the time/requires cueing < 10% of the time  Memory Memory assist level: Recognizes or recalls 75 - 89% of the time/requires cueing 10 - 24% of the time   Medical Problem List and Plan: 1.  Right sided weakness and dysarthria secondary to left basal ganglia infarction secondary to small vessel disease  -continue therapies. remains motivated  -need to touch base with family re: ability to provide some assistance at her apt 2.  DVT Prophylaxis/Anticoagulation: SCDs. Monitor for any signs of DVT 3. Pain Management: Voltaren gel as needed 4. Mood: Celexa 40 mg daily 5. Neuropsych: This patient is capable of making decisions on her own behalf. 6. Skin/Wound Care: Routine skin checks 7. Fluids/Electrolytes/Nutrition: encourage PO  -replacing potassium  -recheck tomorrow 8. Dysphagia. Dysphagia #2 nectar liquids. Advance per speech therapy 9. Hypertension. Cozaar 100 mg daily,  Toprol 25 mg daily  -bp controlled at present 10. Hyperlipidemia. Lipitor 11. Diabetes mellitus with peripheral neuropathy. Hemoglobin A1c 5.5. Patient diet controlled prior to admission.  -controlled  LOS (Days) 9 A FACE TO FACE EVALUATION WAS PERFORMED  Ranelle Oyster, MD 07/27/2017 8:59 AM

## 2017-07-27 NOTE — Discharge Instructions (Signed)
Inpatient Rehab Discharge Instructions  Kristi David Discharge date and time: No discharge date for patient encounter.   Activities/Precautions/ Functional Status: Activity: activity as tolerated Diet: Dysphagia #2 nectar thick liquids Wound Care: none needed Functional status:  ___ No restrictions     ___ Walk up steps independently ___ 24/7 supervision/assistance   ___ Walk up steps with assistance ___ Intermittent supervision/assistance  _x STROKE/TIA DISCHARGE INSTRUCTIONS SMOKING Cigarette smoking nearly doubles your risk of having a stroke & is the single most alterable risk factor  If you smoke or have smoked in the last 12 months, you are advised to quit smoking for your health.  Most of the excess cardiovascular risk related to smoking disappears within a year of stopping.  Ask you doctor about anti-smoking medications  [place] Quit Line: 1-800-QUIT NOW  Free Smoking Cessation Classes [phone]  CHOLESTEROL Know your levels; limit fat & cholesterol in your diet  Lipid Panel     Component Value Date/Time   CHOL 250 (H) 07/17/2017 0658   TRIG 85 07/17/2017 0658   HDL 58 07/17/2017 0658   CHOLHDL 4.3 07/17/2017 0658   VLDL 17 07/17/2017 0658   LDLCALC 175 (H) 07/17/2017 0658      Many patients benefit from treatment even if their cholesterol is at goal.  Goal: Total Cholesterol (CHOL) less than 160  Goal:  Triglycerides (TRIG) less than 150  Goal:  HDL greater than 40  Goal:  LDL (LDLCALC) less than 100   BLOOD PRESSURE American Stroke Association blood pressure target is less that 120/80 mm/Hg  Your discharge blood pressure is:  BP: (!) 135/57  Monitor your blood pressure  Limit your salt and alcohol intake  Many individuals will require more than one medication for high blood pressure  DIABETES (A1c is a blood sugar average for last 3 months) Goal HGBA1c is under 7% (HBGA1c is blood sugar average for last 3 months)  Diabetes:   Lab Results  Component  Value Date   HGBA1C 5.5 07/17/2017     Your HGBA1c can be lowered with medications, healthy diet, and exercise.  Check your blood sugar as directed by your physician  Call your physician if you experience unexplained or low blood sugars.  PHYSICAL ACTIVITY/REHABILITATION Goal is 30 minutes at least 4 days per week  Activity: Increase activity slowly, Therapies: Physical Therapy: Home Health Return to work:   Activity decreases your risk of heart attack and stroke and makes your heart stronger.  It helps control your weight and blood pressure; helps you relax and can improve your mood.  Participate in a regular exercise program.  Talk with your doctor about the best form of exercise for you (dancing, walking, swimming, cycling).  DIET/WEIGHT Goal is to maintain a healthy weight  Your discharge diet is: DIET DYS 2 Room service appropriate? Yes; Fluid consistency: Nectar Thick  liquids Your height is:  Height: 5\' 9"  (175.3 cm) Your current weight is:   Your Body Mass Index (BMI) is:     Following the type of diet specifically designed for you will help prevent another stroke.  Your goal weight range is:    Your goal Body Mass Index (BMI) is 19-24.  Healthy food habits can help reduce 3 risk factors for stroke:  High cholesterol, hypertension, and excess weight.  RESOURCES Stroke/Support Group:  Call [phone]   STROKE EDUCATION PROVIDED/REVIEWED AND GIVEN TO PATIENT Stroke warning signs and symptoms How to activate emergency medical system (call 911). Medications prescribed at  discharge. Need for follow-up after discharge. Personal risk factors for stroke. Pneumonia vaccine given:  Flu vaccine given:  My questions have been answered, the writing is legible, and I understand these instructions.  I will adhere to these goals & educational materials that have been provided to me after my discharge from the hospital.   __ Bathe/dress independently ___ Walk with walker     ___  Bathe/dress with assistance ___ Walk Independently    ___ Shower independently ___ Walk with assistance    ___ Shower with assistance ___ No alcohol     ___ Return to work/school ________   COMMUNITY REFERRALS UPON DISCHARGE:    Home Health:   PT     OT     ST                      Agency:  [facility] Phone: [phone]   Medical Equipment/Items Ordered: cane, tub seat                                                     Agency/Supplier:  [facility] @ [phone]   GENERAL COMMUNITY RESOURCES FOR PATIENT/FAMILY:  Support Groups:  Stroke Support Group                              2nd Thursday of each month @ 3:00 pm                              [facility]                              Contact:  Estill DoomsCaitlin Warren @ [phone]          Special Instructions:    My questions have been answered and I understand these instructions. I will adhere to these goals and the provided educational materials after my discharge from the hospital.  Patient/Caregiver Signature _______________________________ Date __________  Clinician Signature _______________________________________ Date __________  Please bring this form and your medication list with you to all your follow-up doctor's appointments.

## 2017-07-27 NOTE — Progress Notes (Signed)
Social Work Patient ID: Kristi NgHettie Baig, female   DOB: 06/10/1941, 76 y.o.   MRN: [id number]   Deatra InaHoyle, Lilly Gasser R, LCSW Social Worker Signed   Patient Care Conference Date of Service: 07/27/2017 12:33 PM      [] Hide copied text [] Hover for attribution information Inpatient RehabilitationTeam Conference and Plan of Care Update Date: 07/26/2017   Time: 2:15 PM    Patient Name: Kristi David      Medical Record Number: [id number]  Date of Birth: 09/27/1941 Sex: Female         Room/Bed: 4W13C/4W13C-01 Payor Info: Payor: MEDICARE / Plan: MEDICARE PART A AND B / Product Type: *No Product type* /    Admitting Diagnosis: L CVA  Admit Date/Time:  07/18/2017  4:46 PM Admission Comments: No comment available   Primary Diagnosis:  Left basal ganglia embolic stroke [facility](HCC) Principal Problem: Left basal ganglia embolic stroke [facility](HCC)      Patient Active Problem List   Diagnosis Date Noted  . Left basal ganglia embolic stroke (HCC) 07/18/2017  . Facial droop   . Right sided weakness   . Diabetes mellitus (HCC)   . Diastolic dysfunction   . Dysphagia, post-stroke   . Dysarthria, post-stroke   . Hypokalemia   . Acute blood loss anemia   . Hyperlipidemia   . Stroke (HCC) 07/16/2017  . Hypertension 07/16/2017  . Arthritis of right hand 01/11/2017  . Osteoarthritis of right knee 01/06/2016  . Status post right partial knee replacement 01/06/2016    Expected Discharge Date: Expected Discharge Date: 07/30/17  Team Members Present: Physician leading conference: Dr. Faith RogueZachary Swartz Social Worker Present: Amada JupiterLucy Ariyonna Twichell, LCSW Nurse Present: Kennon PortelaJeanna Hicks, RN PT Present: Alyson ReedyElizabeth Tygielski, PT OT Present: Ardis Rowanom Lanier, COTA;Jennifer Katrinka BlazingSmith, OT SLP Present: Colin BentonMadison Cratch, SLP PPS Coordinator present : Tora DuckMarie Noel, RN, CRRN     Current Status/Progress Goal Weekly Team Focus  Medical   continued Right hemiparesis due to CVA. bp controlled  maximize functional mobility  nutrition, bp  control   Bowel/Bladder   Continent of B&B and regular of bowel.  Remain continent and regular.  Monitor every shift and PRN and assist as needed.   Swallow/Nutrition/ Hydration   Dys 2 and nectar thick liquids, trials of thin, carryover swallow strategies Min A  Supervision  water protocol, order repeat MBS, trials of Dys 3   ADL's   bathing-steady A; LB dressing-steady A; UB dressing-supervsion; grooming-supervision; functional transfers-close supervision; initiating use of RUE but currently max A as stabilizer  supervision overall  RUE NMR, standing balance, BADL retraining, functional amb with SPC   Mobility   modI bed mobility, S transfers, min guard gait and stairs  modI bed mobility and transfers, S gait and stairs  dynamic standing balance, LE strength/ROM/coordination, activity tolerance   Communication   Speech intelligibility sentence level, Min A  Supervision  carryover strategies    Safety/Cognition/ Behavioral Observations  Min A  Supervision  mildly complex problem solving, recall    Pain   No c/o pain.  <2.  Assess and treat every shift and as needed.   Skin   No complications.  Free of infection or breakdown.  Assess q shift and as needed.    Rehab Goals Patient on target to meet rehab goals: Yes *See Care Plan and progress notes for long and short-term goals.     Barriers to Discharge  Current Status/Progress Possible Resolutions Date Resolved   Physician    Medical stability  treat bp, stroke related symptoms      Nursing                PT                    OT                 SLP            SW            Discharge Planning/Teaching Needs:  Pt to d/c home to her independent apartment with intermittent support of family and friends.      Team Discussion:  Doing very well overall. Showing some movement in tight shoulder.  Currently supervision standing balance.  Plan to upgrade amb goals to mod independent.  Dys 2, nectar  but plan to repeat MBS on Thursday and hope to upgrade.  On target for Sat d/c.  Revisions to Treatment Plan:  None    Continued Need for Acute Rehabilitation Level of Care: The patient requires daily medical management by a physician with specialized training in physical medicine and rehabilitation for the following conditions: Daily direction of a multidisciplinary physical rehabilitation program to ensure safe treatment while eliciting the highest outcome that is of practical value to the patient.: Yes Daily medical management of patient stability for increased activity during participation in an intensive rehabilitation regime.: Yes Daily analysis of laboratory values and/or radiology reports with any subsequent need for medication adjustment of medical intervention for : Neurological problems  Dereke Neumann 07/27/2017, 12:34 PM

## 2017-07-27 NOTE — Progress Notes (Signed)
Occupational Therapy Session Note  Patient Details  Name: Kristi David MRN: [id number] Date of Birth: 09/28/1940  Today's Date: 07/27/2017 OT Individual Time: 0800-0900 OT Individual Time Calculation (min): 60 min    Short Term Goals: Week 2:  OT Short Term Goal 1 (Week 2): STG=LTG secondary to ELOS  Skilled Therapeutic Interventions/Progress Updates:    Pt engaged in BADL retraining including bathing at shower level and dressing with sit<>stand from seat.  Pt completed all bathing/dressing tasks at supervision level.  Pt employs hemi bathing/dressing strategies independently.  Pt continues to require min verbal cues for safety awareness.  Pt completed grooming tasks while standing at sink.  Focus on BADL retraining, functional amb without AD, standing balance, and safety awareness to increase independence with BADLs.   Therapy Documentation Precautions:  Precautions Precautions: Fall Restrictions Weight Bearing Restrictions: No   Pain:  Pt denied pain   See Function Navigator for Current Functional Status.   Therapy/Group: Individual Therapy  Rich BraveLanier, Reeda Soohoo Chappell 07/27/2017, 10:02 AM

## 2017-07-28 ENCOUNTER — Inpatient Hospital Stay (HOSPITAL_COMMUNITY): Payer: Medicare Other

## 2017-07-28 ENCOUNTER — Inpatient Hospital Stay (HOSPITAL_COMMUNITY): Payer: Medicare Other | Admitting: Occupational Therapy

## 2017-07-28 ENCOUNTER — Inpatient Hospital Stay (HOSPITAL_COMMUNITY): Payer: Medicare Other | Admitting: Speech Pathology

## 2017-07-28 ENCOUNTER — Inpatient Hospital Stay (HOSPITAL_COMMUNITY): Payer: Medicare Other | Admitting: Physical Therapy

## 2017-07-28 DIAGNOSIS — E876 Hypokalemia: Secondary | ICD-10-CM

## 2017-07-28 LAB — GLUCOSE, CAPILLARY
GLUCOSE-CAPILLARY: 94 mg/dL (ref 65–99)
GLUCOSE-CAPILLARY: 93 mg/dL (ref 65–99)
Glucose-Capillary: 106 mg/dL — ABNORMAL HIGH (ref 65–99)
Glucose-Capillary: 120 mg/dL — ABNORMAL HIGH (ref 65–99)

## 2017-07-28 MED ORDER — BLOOD GLUCOSE METER KIT: PACK | 0 refills | Status: AC

## 2017-07-28 MED ORDER — CITALOPRAM HYDROBROMIDE 40 MG PO TABS: 40.0000 mg | ORAL_TABLET | Freq: Every day | ORAL | 0 refills | Status: AC

## 2017-07-28 MED ORDER — METOPROLOL SUCCINATE ER 25 MG PO TB24: 25.0000 mg | ORAL_TABLET | Freq: Every day | ORAL | 0 refills | Status: DC

## 2017-07-28 MED ORDER — LOSARTAN POTASSIUM 100 MG PO TABS: 100.0000 mg | ORAL_TABLET | Freq: Every day | ORAL | 0 refills | Status: AC

## 2017-07-28 MED ORDER — DICLOFENAC SODIUM 1 % TD GEL: 2.0000 g | Freq: Four times a day (QID) | TRANSDERMAL | 3 refills | Status: DC

## 2017-07-28 MED ORDER — CLOPIDOGREL BISULFATE 75 MG PO TABS: 75.0000 mg | ORAL_TABLET | Freq: Every day | ORAL | 0 refills | Status: AC

## 2017-07-28 MED ORDER — ATORVASTATIN CALCIUM 80 MG PO TABS: 80.0000 mg | ORAL_TABLET | Freq: Every day | ORAL | 0 refills | Status: DC

## 2017-07-28 NOTE — Progress Notes (Signed)
Physical Therapy Session Note  Patient Details  Name: Kristi David MRN: [id number] Date of Birth: 07/02/1941  Today's Date: 07/28/2017 PT Individual Time: 1000-1100 PT Individual Time Calculation (min): 60 min   Short Term Goals: Week 2:  PT Short Term Goal 1 (Week 2): =LTG due to estimated LOS  Skilled Therapeutic Interventions/Progress Updates: Pt received supine in bed, denies pain and agreeable to treatment. Pt's pastor present for initial part of session to observe and provide encouragement. Pt performs bed mobility modI. Dons pants and shoes modI with increased time. G[facility] to/from [facility] and S. Lateral stepping and forward/backward stepping over trekking pole with min guard; mod cues for technique and foot placement. Combined activity into four square step test, performed x2 trials untimed d/t slow speed and max cues for sequencing, and pt touching poles occasionally which would result in 0 for timed trial. Performed biodex catch game on level and unlocked platform for focus on weight shifting, ankle/hip strategy and righting reactions. Returned to room with S ambulation with SPC. Remained seated on EOB with handoff to NT for S for lunch, all needs in reach.      Therapy Documentation Precautions:  Precautions Precautions: Fall Restrictions Weight Bearing Restrictions: No   See Function Navigator for Current Functional Status.   Therapy/Group: Individual Therapy  Vista Lawmanlizabeth J Tygielski 07/28/2017, 10:58 AM

## 2017-07-28 NOTE — Progress Notes (Signed)
Modified Barium Swallow Progress Note  Patient Details  Name: Kristi David MRN: [id number] Date of Birth: 12/21/1940  Today's Date: 07/28/2017  Modified Barium Swallow completed.  Full report located under Chart Review in the Imaging Section.  Brief recommendations include the following:  Clinical Impression  Pt presents with improved swallow function c/b mild oral phase dysphagia characterized by decreased lingual manipulation of bolus whichleads to mildly increased oral phase but pt able to demonstrate complete oral clearing. Pt with functional pharyngeal phase c/b timely swallow initiaiton, no penetration or aspiration with thin liquids via straw. Pt with trace vallecular residue but didn't pose any aspiration risks. recommend upgrading to thin liquids (without use of chin tuck) and may try dysphagia 3 at bedside. Medicine whole in puree with continued supervision for use of compensatory swallow strategies.   Swallow Evaluation Recommendations       SLP Diet Recommendations: Dysphagia 3 (Mech soft) solids;Thin liquid   Liquid Administration via: Straw   Medication Administration: Whole meds with puree   Supervision: Patient able to self feed;Full supervision/cueing for compensatory strategies   Compensations: Minimize environmental distractions;Slow rate;Small sips/bites   Postural Changes: Seated upright at 90 degrees   Oral Care Recommendations: Oral care BID        Izacc Demeyer 07/28/2017,9:40 AM

## 2017-07-28 NOTE — Progress Notes (Signed)
Occupational Therapy Note  Patient Details  Name: Kristi David MRN: [id number] Date of Birth: December 20, 1940  Today's Date: 07/28/2017 OT Individual Time: 1130-1200 OT Individual Time Calculation (min): 30 min   No c/o pain.  Pt seen this session for RUE NMR. Pt received in room, ambulated to gym with [facility] with close S to steadying A.  In gym, stood at elevated table to work on a/arom of RUE with pushing a plastic disc on table to simulate using a sponge to clean a table. Worked on sh flex,ext, horizontal flex/ext, and circular arm movements.  Pt sat to rest for a minute. Stood again for guided grasp and release of cones to continue to facilitate active shoulder, bicep, and finger movement.  She is improving with active movement of pointer finger.  Pt ambulated back to room and was set up in w/c with all needs met.    [place] 07/28/2017, 12:34 PM

## 2017-07-28 NOTE — Progress Notes (Signed)
Speech Language Pathology Daily Session Note  Patient Details  Name: Kristi NgHettie Palma MRN: [id number] Date of Birth: 09/27/1940  Today's Date: 07/28/2017   Skilled treatment session #1 SLP Individual Time: 0945-1000 SLP Individual Time Calculation (min): 15 min   Skilled treatment session #2 SLP Individual Time: 1300-1345 SLP Individual Time Calculation (min): 45 min  Short Term Goals: Week 2: SLP Short Term Goal 1 (Week 2): Pt will consume dys 2 and nectar thick liquids with Min overt s/s aspiration and supervision verbal cues for use of swallowing compensatory strategies. SLP Short Term Goal 2 (Week 2): Pt will tolerate trials of thin liquids with overt s/s aspiration in order to assess readiness for instrumental swallow study. SLP Short Term Goal 3 (Week 2): Pt will tolerate trials of dys 3 with Min overt s/s aspritation and Min A verbal cues to utilize swallow compensatory strategies to clear oral residue. SLP Short Term Goal 4 (Week 2): Pt will demonstarte functional probelm solving for basic tasks with Min A verbal cues. SLP Short Term Goal 5 (Week 2): Pt will recall daily infromation with Mod A verbal cues for use of external aids. SLP Short Term Goal 6 (Week 2): Pt will utilize speech intellgibility strategies at the sentence level with supervision verbal cues to achieve 70% intellgibility.   Skilled Therapeutic Interventions:  Skilled treatment session #1 focused on completing education related to improvement in swallow function and ability to consume thin liquids without chin and throughout day. Pt voiced understanding and information shared with nursing.   Skilled treatment session #2 focused on dysphagia and cognition goals. SLP facilitated session by providing skilled observation of pt consuming dysphagia 3 lunch tray with thin liquids via cup. Pt with no oral residue and cognitively realized that the broccoli was too difficult to chew so she removed it and didn't attempt again. TV  was left on in order to simulate more realistic diversion of attention. Pt with good attention to task and continued to tolerate dysphagia 3/thin liquids with no support by SLP. Therefore recommend that pt be made intermittent supervision when eating. Pt left upright in recliner with all needs within reach. Continue per current plan of care.      Function:  Eating Eating   Modified Consistency Diet: No Eating Assist Level: Set up assist for;Supervision or verbal cues   Eating Set Up Assist For: Opening containers       Cognition Comprehension Comprehension assist level: Follows basic conversation/direction with no assist  Expression   Expression assist level: Expresses basic needs/ideas: With extra time/assistive device  Social Interaction Social Interaction assist level: Interacts appropriately 90% of the time - Needs monitoring or encouragement for participation or interaction.  Problem Solving Problem solving assist level: Solves basic 90% of the time/requires cueing < 10% of the time  Memory Memory assist level: Recognizes or recalls 75 - 89% of the time/requires cueing 10 - 24% of the time    Pain    Therapy/Group: Individual Therapy  Khadejah Son 07/28/2017, 10:10 AM

## 2017-07-28 NOTE — Progress Notes (Signed)
Occupational Therapy Note  Patient Details  Name: Kristi David MRN: [id number] Date of Birth: 10/02/1940  Today's Date: 07/28/2017 OT Individual Time: 1400-1430 OT Individual Time Calculation (min): 30 min   Pt denies pain Individual Therapy  Initial focus on practicing walk in shower transfers stepping over 4" ledge with SPC.  Pt practiced X 4 at supervision level.  Pt transitioned to focus on RUE table activities with emphasis on shoulder flexion/extension and horizontal abduction/adduction.  Pt with trace finger flexion/extension.  RUE supination approx 90 degrees. Pt with trace shoulder elevation with facilitation at scapula from therapist. Pt remained in w/c with all needs within reach and visitor present.    Lavone NeriLanier, Ahyana Skillin [facility] 07/28/2017, 2:38 PM

## 2017-07-28 NOTE — Progress Notes (Signed)
[place] PHYSICAL MEDICINE & REHABILITATION     PROGRESS NOTE    Subjective/Complaints: Up in bed. No complaints. Happy with progress  ROS: pt denies nausea, vomiting, diarrhea, cough, shortness of breath or chest pain   Objective: Vital Signs: Blood pressure (!) 146/64, pulse 70, temperature 98.6 F (37 C), temperature source Oral, resp. rate 18, height 5\' 9"  (1.753 m), SpO2 98 %. No results found. No results for input(s): WBC, HGB, HCT, PLT in the last 72 hours. No results for input(s): NA, K, CL, GLUCOSE, BUN, CREATININE, CALCIUM in the last 72 hours.  Invalid input(s): CO CBG (last 3)   Recent Labs  07/27/17 1635 07/27/17 2101 07/28/17 0653  GLUCAP 109* 95 106*    Wt Readings from Last 3 Encounters:  07/17/17 88 kg (194 lb 0.1 oz)  01/06/16 87.1 kg (192 lb)  12/31/15 87.2 kg (192 lb 3.9 oz)    Physical Exam:  Constitutional: She appears well-developed and well-nourished.  HENT:  Head: Normocephalic and atraumatic.  Eyes: EOMI.  Neck: Normal range of motion. Neck supple. No thyromegaly present.  Cardiovascular: RRR without murmur. No JVD    Respiratory: CTA Bilaterally without wheezes or rales. Normal effort  GI: BS +, non-tender, non-distended .  Musculoskeletal: She exhibits no edema or tenderness.  Neurological: She is alert.   Speech dysarthric, voice soft. right central 7 and tongue deviation still present Motor: LUE/LLE: 5/5 proximal to distal RUE: Shoulder abduction   2+/5 proximal to distal, elbow flex/ext 2/5, grip 2 /5 ---exam stable RLE: 4-/5 HF, KE and 4/5 ADF/PF.   Sensation intact to light touch on right.    Skin: Skin is warm and dry.  Psychiatric: flat but pleasant.   Assessment/Plan: 1. Right hemiparesis and functional deficits secondary to right hemiparesis which require 3+ hours per day of interdisciplinary therapy in a comprehensive inpatient rehab setting. Physiatrist is providing close team supervision and 24 hour management of  active medical problems listed below. Physiatrist and rehab team continue to assess barriers to discharge/monitor patient progress toward functional and medical goals.  Function:  Bathing Bathing position   Position: Shower  Bathing parts Body parts bathed by patient: Right arm, Left arm, Chest, Abdomen, Front perineal area, Buttocks, Right upper leg, Left upper leg, Right lower leg, Left lower leg Body parts bathed by helper: Right lower leg, Left lower leg, Back, Left arm  Bathing assist Assist Level: Supervision or verbal cues      Upper Body Dressing/Undressing Upper body dressing   What is the patient wearing?: Pull over shirt/dress     Pull over shirt/dress - Perfomed by patient: Thread/unthread right sleeve, Thread/unthread left sleeve, Put head through opening, Pull shirt over trunk Pull over shirt/dress - Perfomed by helper: Pull shirt over trunk        Upper body assist Assist Level: Supervision or verbal cues      Lower Body Dressing/Undressing Lower body dressing   What is the patient wearing?: Underwear, Pants, Shoes Underwear - Performed by patient: Thread/unthread right underwear leg, Thread/unthread left underwear leg, Pull underwear up/down Underwear - Performed by helper: Pull underwear up/down Pants- Performed by patient: Thread/unthread right pants leg, Thread/unthread left pants leg, Pull pants up/down, Fasten/unfasten pants Pants- Performed by helper: Thread/unthread right pants leg, Thread/unthread left pants leg         Shoes - Performed by patient: Don/doff right shoe, Don/doff left shoe Shoes - Performed by helper: Don/doff right shoe, Don/doff left shoe  Lower body assist Assist for lower body dressing: Supervision or verbal cues      Toileting Toileting   Toileting steps completed by patient: Adjust clothing prior to toileting, Performs perineal hygiene, Adjust clothing after toileting Toileting steps completed by helper: Adjust  clothing after toileting Toileting Assistive Devices: Grab bar or rail  Toileting assist Assist level: Supervision or verbal cues   Transfers Chair/bed transfer   Chair/bed transfer method: Ambulatory Chair/bed transfer assist level: Supervision or verbal cues Chair/bed transfer assistive device: Armrests, Cane     Locomotion Ambulation     Max distance: 150' Assist level: Supervision or verbal cues   Wheelchair   Type: Manual Max wheelchair distance: 75 Assist Level: Moderate assistance (Pt 50 - 74%)  Cognition Comprehension Comprehension assist level: Follows basic conversation/direction with no assist  Expression Expression assist level: Expresses basic needs/ideas: With extra time/assistive device  Social Interaction Social Interaction assist level: Interacts appropriately 90% of the time - Needs monitoring or encouragement for participation or interaction.  Problem Solving Problem solving assist level: Solves basic 90% of the time/requires cueing < 10% of the time  Memory Memory assist level: Recognizes or recalls 75 - 89% of the time/requires cueing 10 - 24% of the time   Medical Problem List and Plan: 1.  Right sided weakness and dysarthria secondary to left basal ganglia infarction secondary to small vessel disease  -continue therapies. remains motivated  -family ed prior to dc 2.  DVT Prophylaxis/Anticoagulation: SCDs. Monitor for any signs of DVT 3. Pain Management: Voltaren gel as needed 4. Mood: Celexa 40 mg daily 5. Neuropsych: This patient is capable of making decisions on her own behalf. 6. Skin/Wound Care: Routine skin checks 7. Fluids/Electrolytes/Nutrition: encourage PO  -replacing potassium  -follow up labs pending today 8. Dysphagia. Dysphagia #2 nectar liquids. Advance per speech therapy 9. Hypertension. Cozaar 100 mg daily, Toprol 25 mg daily  -bp remains controlled   10. Hyperlipidemia. Lipitor 11. Diabetes mellitus with peripheral neuropathy.  Hemoglobin A1c 5.5. Patient diet controlled prior to admission.  -controlled  LOS (Days) 10 A FACE TO FACE EVALUATION WAS PERFORMED  Ranelle Oyster, MD 07/28/2017 9:15 AM

## 2017-07-29 ENCOUNTER — Inpatient Hospital Stay (HOSPITAL_COMMUNITY): Payer: Medicare Other | Admitting: Speech Pathology

## 2017-07-29 ENCOUNTER — Inpatient Hospital Stay (HOSPITAL_COMMUNITY): Payer: Medicare Other

## 2017-07-29 ENCOUNTER — Inpatient Hospital Stay (HOSPITAL_COMMUNITY): Payer: Medicare Other | Admitting: Physical Therapy

## 2017-07-29 LAB — GLUCOSE, CAPILLARY
GLUCOSE-CAPILLARY: 107 mg/dL — AB (ref 65–99)
Glucose-Capillary: 103 mg/dL — ABNORMAL HIGH (ref 65–99)
Glucose-Capillary: 98 mg/dL (ref 65–99)

## 2017-07-29 NOTE — Progress Notes (Signed)
Occupational Therapy Note  Patient Details  Name: Jossie NgHettie Juhasz MRN: [id number] Date of Birth: 08/02/1941  Today's Date: 07/29/2017 OT Individual Time: 1300-1315 OT Individual Time Calculation (min): 15 min  and Today's Date: 07/29/2017 OT Missed Time: 30 Minutes Missed Time Reason: Other (comment) (visitors and eating lunch)  Pt denies pain Individual Therapy  Pt resting in chair upon arrival eating lunch with visitors present.  Pt resistant to leaving room for therapy and preferred to remain visiting.  Reviewed safety recommendations. Pt verbalized understanding. Pt remained seated in chair with visitors present.  Pt mod I in room with SPC.    Lavone NeriLanier, Oumou Smead [facility] 07/29/2017, 1:35 PM

## 2017-07-29 NOTE — Progress Notes (Signed)
[place] PHYSICAL MEDICINE & REHABILITATION     PROGRESS NOTE    Subjective/Complaints: No new complaints. Excited to be going home. Wondered who would be reviewing her medications before discharge with her/family  ROS: pt denies nausea, vomiting, diarrhea, cough, shortness of breath or chest pain   Objective: Vital Signs: Blood pressure (!) 137/57, pulse 62, temperature 98.6 F (37 C), temperature source Oral, resp. rate 18, height 5\' 9"  (1.753 m), SpO2 100 %. Dg Swallowing Func-speech Pathology  Result Date: 07/28/2017 Objective Swallowing Evaluation: Type of Study: MBS-Modified Barium Swallow Study Patient Details Name: Kristi David MRN: [id number] Date of Birth: 10/30/1940 Today's Date: 07/28/2017 Time: SLP Start Time (ACUTE ONLY): 0838-SLP Stop Time (ACUTE ONLY): 0903 SLP Time Calculation (min) (ACUTE ONLY): 25 min Past Medical History: Past Medical History: Diagnosis Date . Acute blood loss anemia  . Anemia  . Arthritis   "legs, knees" (01/07/2016) . Arthritis of right hand 01/11/2017 . Diabetes mellitus (HCC)  . Diastolic dysfunction  . Dysarthria, post-stroke  . Dysphagia, post-stroke  . Facial droop  . GERD (gastroesophageal reflux disease)   no meds . High cholesterol  . History of blood transfusion  . Hyperlipidemia  . Hypertension  . Hypokalemia  . Left basal ganglia embolic stroke (HCC) 07/18/2017 . Osteoarthritis of right knee 01/06/2016 . Seasonal allergies  . Shortness of breath dyspnea   if too active . Stroke (HCC) 07/16/2017 . Syncope   passed out and was hospitalized for a couple of days . Type II diabetes mellitus (HCC)   does not take medicine diet controlled Past Surgical History: Past Surgical History: Procedure Laterality Date . BUNIONECTOMY Bilateral  . CATARACT EXTRACTION W/ INTRAOCULAR LENS  IMPLANT, BILATERAL Bilateral  . INGUINAL HERNIA REPAIR Right  . KNEE ARTHROSCOPY WITH EXCISION BAKER'S CYST Right  . MR LOWER LEG LEFT ([facility] HX) Left   after a break . PARTIAL KNEE  ARTHROPLASTY Right 01/06/2016  Procedure: RIGHT UNICOMPARTMENTAL KNEE ARTHROPLASTY;  Surgeon: Kathryne Hitchhristopher Y Blackman, MD;  [facility];  Service: Orthopedics;  Laterality: Right; . VEIN LIGATION AND STRIPPING Bilateral  HPI: Kristi Dorseyis an 76 y.o.femalewho presents to the ED from her independent living facility for evaluation of right sided weakness with facial droop which started on Thursday. The patient'sson called hertoday and noted slurred speech. On presentation to the ED, the patient was visibly weak on the right, also with slurred speech and right facial droop. She has no prior history of stroke. Denies confusion, trouble getting words out or difficulty comprehending speech. CT in the ED reveals an area of subacute ischemia within the centrum semiovale on the left. MRI showed Acute/early subacute infarction involving left posterior putamen, caudate body, and corona radiata measuring 3.5 cc. Subjective: Alert, pleasant, with right facial droop Assessment / Plan / Recommendation [facility] IP CLINICAL IMPRESSIONS 07/28/2017 Clinical Impression Pt presents with improved swallow function c/b mild oral phase dysphagia characterized by decreased lingual manipulation of bolus whichleads to mildly increased oral phase but pt able to demonstrate complete oral clearing. Pt with functional pharyngeal phase c/b timely swallow initiaiton, no penetration or aspiration with thin liquids via straw. Pt with trace vallecular residue but didn't pose any aspiration risks. recommend upgrading to thin liquids (without use of chin tuck) and may try dysphagia 3 at bedside. Medicine whole in puree with continued supervision for use of compensatory swallow strategies. SLP Visit Diagnosis Dysarthria and anarthria (R47.1);Cognitive communication deficit (R41.841);Dysphagia, oropharyngeal phase (R13.12) Attention and concentration deficit following -- Frontal lobe and executive function  deficit following -- Impact on safety and  function Mild aspiration risk   [facility] IP TREATMENT RECOMMENDATION 07/17/2017 Treatment Recommendations F/U MBS in --- days (Comment);Therapy as outlined in treatment plan below   Prognosis 07/17/2017 Prognosis for Safe Diet Advancement Good Barriers to Reach Goals -- Barriers/Prognosis Comment -- [facility] IP DIET RECOMMENDATION 07/28/2017 SLP Diet Recommendations Dysphagia 3 (Mech soft) solids;Thin liquid Liquid Administration via Straw Medication Administration Whole meds with puree Compensations Minimize environmental distractions;Slow rate;Small sips/bites Postural Changes Seated upright at 90 degrees   CH[facility] IP OTHER RECOMMENDATIONS 07/28/2017 Recommended Consults -- Oral Care Recommendations Oral care BID Other Recommendations --   [facility] IP FOLLOW UP RECOMMENDATIONS 07/18/2017 Follow up Recommendations Inpatient Rehab   [facility] IP FREQUENCY AND DURATION 07/18/2017 Speech Therapy Frequency (ACUTE ONLY) min 2x/week Treatment Duration --      [facility] IP ORAL PHASE 07/28/2017 Oral Phase Impaired Oral - Pudding Teaspoon -- Oral - Pudding Cup -- Oral - Honey Teaspoon -- Oral - Honey Cup NT Oral - Nectar Teaspoon NT Oral - Nectar Cup NT Oral - Nectar Straw NT Oral - Thin Teaspoon Weak lingual manipulation Oral - Thin Cup Weak lingual manipulation Oral - Thin Straw Weak lingual manipulation Oral - Puree Weak lingual manipulation Oral - Mech Soft -- Oral - Regular NT Oral - Multi-Consistency -- Oral - Pill Weak lingual manipulation Oral Phase - Comment --  CH[facility] IP PHARYNGEAL PHASE 07/28/2017 Pharyngeal Phase WFL Pharyngeal- Pudding Teaspoon -- Pharyngeal -- Pharyngeal- Pudding Cup -- Pharyngeal -- Pharyngeal- Honey Teaspoon -- Pharyngeal -- Pharyngeal- Honey Cup NT Pharyngeal -- Pharyngeal- Nectar Teaspoon NT Pharyngeal -- Pharyngeal- Nectar Cup NT Pharyngeal -- Pharyngeal- Nectar Straw NT Pharyngeal -- Pharyngeal- Thin Teaspoon WFL Pharyngeal -- Pharyngeal- Thin Cup WFL Pharyngeal -- Pharyngeal- Thin Straw -- Pharyngeal -- Pharyngeal- Puree  WFL Pharyngeal -- Pharyngeal- Mechanical Soft -- Pharyngeal -- Pharyngeal- Regular WFL Pharyngeal -- Pharyngeal- Multi-consistency -- Pharyngeal -- Pharyngeal- Pill -- Pharyngeal -- Pharyngeal Comment --  CHL IP CERVICAL ESOPHAGEAL PHASE 07/28/2017 Cervical Esophageal Phase WFL Pudding Teaspoon -- Pudding Cup -- Honey Teaspoon -- Honey Cup -- Nectar Teaspoon -- Nectar Cup -- Nectar Straw -- Thin Teaspoon -- Thin Cup -- Thin Straw -- Puree -- Mechanical Soft -- Regular -- Multi-consistency -- Pill -- Cervical Esophageal Comment -- No flowsheet data found. Happi Overton 07/28/2017, 9:41 AM              No results for input(s): WBC, HGB, HCT, PLT in the last 72 hours. No results for input(s): NA, K, CL, GLUCOSE, BUN, CREATININE, CALCIUM in the last 72 hours.  Invalid input(s): CO CBG (last 3)   Recent Labs  07/28/17 1621 07/28/17 2121 07/29/17 0621  GLUCAP 94 93 103*    Wt Readings from Last 3 Encounters:  07/17/17 88 kg (194 lb 0.1 oz)  01/06/16 87.1 kg (192 lb)  12/31/15 87.2 kg (192 lb 3.9 oz)    Physical Exam:  Constitutional: She appears well-developed and well-nourished.  HENT:  Head: Normocephalic and atraumatic.  Eyes: EOMI.  Neck: Normal range of motion. Neck supple. No thyromegaly present.  Cardiovascular: RRR without murmur. No JVD  Respiratory: CTA Bilaterally without wheezes or rales. Normal effort  GI: BS +, non-tender, non-distended .  Musculoskeletal: She exhibits no edema or tenderness.  Neurological: She is alert.   Speech dysarthric, voice soft. right central 7 and tongue deviation still present Motor: LUE/LLE: 5/5 proximal to distal RUE: Shoulder abduction   2+/5 proximal to distal, elbow flex/ext 2/5, grip  2 /5 ---stable RLE: 4-/5 HF, KE and 4/5 ADF/PF.   Sensation intact to light touch on right.    Skin: Skin is warm and dry.  Psychiatric: flat but pleasant.   Assessment/Plan: 1. Right hemiparesis and functional deficits secondary to right hemiparesis which  require 3+ hours per day of interdisciplinary therapy in a comprehensive inpatient rehab setting. Physiatrist is providing close team supervision and 24 hour management of active medical problems listed below. Physiatrist and rehab team continue to assess barriers to discharge/monitor patient progress toward functional and medical goals.  Function:  Bathing Bathing position   Position: Shower  Bathing parts Body parts bathed by patient: Right arm, Left arm, Chest, Abdomen, Front perineal area, Buttocks, Right upper leg, Left upper leg, Right lower leg, Left lower leg Body parts bathed by helper: Right lower leg, Left lower leg, Back, Left arm  Bathing assist Assist Level: Supervision or verbal cues      Upper Body Dressing/Undressing Upper body dressing   What is the patient wearing?: Pull over shirt/dress     Pull over shirt/dress - Perfomed by patient: Thread/unthread right sleeve, Thread/unthread left sleeve, Put head through opening, Pull shirt over trunk Pull over shirt/dress - Perfomed by helper: Pull shirt over trunk        Upper body assist Assist Level: Supervision or verbal cues      Lower Body Dressing/Undressing Lower body dressing   What is the patient wearing?: Underwear, Pants, Shoes Underwear - Performed by patient: Thread/unthread right underwear leg, Thread/unthread left underwear leg, Pull underwear up/down Underwear - Performed by helper: Pull underwear up/down Pants- Performed by patient: Thread/unthread right pants leg, Thread/unthread left pants leg, Pull pants up/down, Fasten/unfasten pants Pants- Performed by helper: Thread/unthread right pants leg, Thread/unthread left pants leg         Shoes - Performed by patient: Don/doff right shoe, Don/doff left shoe Shoes - Performed by helper: Don/doff right shoe, Don/doff left shoe          Lower body assist Assist for lower body dressing: Supervision or verbal cues      Toileting Toileting    Toileting steps completed by patient: Adjust clothing prior to toileting, Performs perineal hygiene, Adjust clothing after toileting Toileting steps completed by helper: Adjust clothing after toileting Toileting Assistive Devices: Grab bar or rail  Toileting assist Assist level: Supervision or verbal cues   Transfers Chair/bed transfer   Chair/bed transfer method: Stand pivot Chair/bed transfer assist level: No Help, no cues, assistive device, takes more than a reasonable amount of time Chair/bed transfer assistive device: Armrests     Locomotion Ambulation     Max distance: 150' Assist level: Supervision or verbal cues   Wheelchair   Type: Manual Max wheelchair distance: 75 Assist Level: Moderate assistance (Pt 50 - 74%)  Cognition Comprehension Comprehension assist level: Follows basic conversation/direction with no assist  Expression Expression assist level: Expresses basic needs/ideas: With extra time/assistive device  Social Interaction Social Interaction assist level: Interacts appropriately 90% of the time - Needs monitoring or encouragement for participation or interaction.  Problem Solving Problem solving assist level: Solves basic 90% of the time/requires cueing < 10% of the time  Memory Memory assist level: Recognizes or recalls 75 - 89% of the time/requires cueing 10 - 24% of the time   Medical Problem List and Plan: 1.  Right sided weakness and dysarthria secondary to left basal ganglia infarction secondary to small vessel disease  -continue therapies. remains motivated  -family ed  prior to dc 11/3  -Patient to see Rehab MD in the office for transitional care encounter in 1-2 weeks.. 2.  DVT Prophylaxis/Anticoagulation: SCDs. Monitor for any signs of DVT 3. Pain Management: Voltaren gel as needed 4. Mood: Celexa 40 mg daily 5. Neuropsych: This patient is capable of making decisions on her own behalf. 6. Skin/Wound Care: Routine skin checks 7.  Fluids/Electrolytes/Nutrition: encourage PO  -replacing potassium  -follow up labs pending today 8. Dysphagia. Dysphagia #2 nectar liquids. Advance per speech therapy 9. Hypertension. Cozaar 100 mg daily, Toprol 25 mg daily  -bp  controlled   10. Hyperlipidemia. Lipitor 11. Diabetes mellitus with peripheral neuropathy. Hemoglobin A1c 5.5. Patient diet controlled prior to admission.  -controlled  LOS (Days) 11 A FACE TO FACE EVALUATION WAS PERFORMED  Ranelle Oyster, MD 07/29/2017 8:59 AM

## 2017-07-29 NOTE — Progress Notes (Signed)
Social Work  Discharge Note  The overall goal for the admission was met for:   Discharge location: Yes - home alone with intermittent support of family and neighbors  Length of Stay: Yes - 12 days  Discharge activity level: Yes - modified independent  Home/community participation: Yes  Services provided included: MD, RD, PT, OT, SLP, RN, TR, Pharmacy and [place]: Medicare and Private Insurance: [place]  Follow-up services arranged: Home Health: PT, OT, ST via [place], DME: cane via [place] and Patient/Family has no preference for HH/DME agencies  Comments (or additional information):  Patient/Family verbalized understanding of follow-up arrangements: Yes  Individual responsible for coordination of the follow-up plan: pt  Confirmed correct DME delivered: Karan Ramnauth 07/29/2017    Kanen Mottola

## 2017-07-29 NOTE — Progress Notes (Signed)
Occupational Therapy Discharge Summary  Patient Details  Name: Kristi David MRN: [id number] Date of Birth: 06-Jun-1941  Patient has met 64 of 10 long term goals due to improved activity tolerance, improved balance, postural control, ability to compensate for deficits, functional use of  RIGHT upper and RIGHT lower extremity, improved attention, improved awareness and improved coordination.  Pt made steady progress with BADLs during this admission.  Pt performs bathing/dressing tasks at supervision level.  Pt is mod I for toilet transfers and toileting.  Pt requires min A for simple meal prep tasks.  Pt will have intermittent supervision after discharge.  Pt initiates use of RUE as gross stabilizer but requires max A to utilize during functional tasks. Patient to discharge at overall Supervision level.  Patient's care partner is independent to provide the necessary physical and cognitive assistance at discharge.      Recommendation:  Patient will benefit from ongoing skilled OT services in home health setting to continue to advance functional skills in the area of BADL and Reduce care partner burden.  Equipment: shower seat  Reasons for discharge: treatment goals met and discharge from hospital  Patient/family agrees with progress made and goals achieved: Yes  OT Discharge Baseline Vision/History: Wears glasses Wears Glasses: Reading only Patient Visual Report: No change from baseline Eye Alignment: Within Functional Limits Ocular Range of Motion: Within Functional Limits Alignment/Gaze Preference: Within Defined Limits Perception  Perception: Within Functional Limits Praxis Praxis: Intact Cognition Overall Cognitive Status: Within Functional Limits for tasks assessed Arousal/Alertness: Awake/alert Orientation Level: Oriented X4 Attention: Sustained Sustained Attention: Appears intact Memory: Appears intact Awareness: Appears intact Awareness Impairment: Emergent  impairment Problem Solving: Appears intact Safety/Judgment: Appears intact Sensation Sensation Light Touch: Appears Intact Stereognosis: Appears Intact Hot/Cold: Appears Intact Proprioception: Appears Intact Coordination Gross Motor Movements are Fluid and Coordinated: No Fine Motor Movements are Fluid and Coordinated: No Coordination and Movement Description: LUE intact, RUE impaired 2/2 hemiplegia Motor  Motor Motor: Hemiplegia Motor - Skilled Clinical Observations: R hemiparesis UE>LE    Trunk/Postural Assessment  Cervical Assessment Cervical Assessment: Within Functional Limits Thoracic Assessment Thoracic Assessment: Within Functional Limits Lumbar Assessment Lumbar Assessment: Within Functional Limits Postural Control Postural Control: Within Functional Limits  Balance Balance Balance Assessed: Yes Standardized Balance Assessment Standardized Balance Assessment: Berg Balance Test;Timed Up and Go Test Berg Balance Test Sit to Stand: Able to stand without using hands and stabilize independently Standing Unsupported: Able to stand safely 2 minutes Sitting with Back Unsupported but Feet Supported on Floor or Stool: Able to sit safely and securely 2 minutes Stand to Sit: Sits safely with minimal use of hands Transfers: Able to transfer safely, minor use of hands Standing Unsupported with Eyes Closed: Able to stand 10 seconds safely Standing Ubsupported with Feet Together: Able to place feet together independently and stand for 1 minute with supervision From Standing, Reach Forward with Outstretched Arm: Can reach forward >12 cm safely (5") From Standing Position, Pick up Object from Floor: Able to pick up shoe safely and easily From Standing Position, Turn to Look Behind Over each Shoulder: Looks behind from both sides and weight shifts well Turn 360 Degrees: Able to turn 360 degrees safely but slowly Standing Unsupported, Alternately Place Feet on Step/Stool: Able to  complete >2 steps/needs minimal assist Standing Unsupported, One Foot in Front: Able to plae foot ahead of the other independently and hold 30 seconds Standing on One Leg: Able to lift leg independently and hold equal to or more than 3 seconds Total  Score: 46 Timed Up and Go Test TUG: Normal TUG Normal TUG (seconds): 21 Static Sitting Balance Static Sitting - Balance Support: Feet supported Static Sitting - Level of Assistance: 6: Modified independent (Device/Increase time) Dynamic Sitting Balance Dynamic Sitting - Balance Support: Feet supported;No upper extremity supported Dynamic Sitting - Level of Assistance: 6: Modified independent (Device/Increase time) Dynamic Sitting - Balance Activities: Lateral lean/weight shifting;Forward lean/weight shifting;Reaching for objects Static Standing Balance Static Standing - Balance Support: During functional activity;No upper extremity supported Static Standing - Level of Assistance: 6: Modified independent (Device/Increase time) Static Stance: On foam Static Stance: on Foam: x30 sec with S Static Stance: on Foam, Eyes Closed: x30 sec minA Dynamic Standing Balance Dynamic Standing - Balance Support: During functional activity;No upper extremity supported Dynamic Standing - Level of Assistance: 6: Modified independent (Device/Increase time) Extremity/Trunk Assessment RUE Assessment RUE Assessment: Exceptions to [facility] (trace shoulder elevation and flexion/extension) LUE Assessment LUE Assessment: Within Functional Limits   See Function Navigator for Current Functional Status.  Leotis Shames [facility] 07/29/2017, 1:46 PM

## 2017-07-29 NOTE — Progress Notes (Signed)
Occupational Therapy Session Note  Patient Details  Name: Kristi David MRN: [id number] Date of Birth: 06/21/1941  Today's Date: 07/29/2017 OT Individual Time: [id number] OT Individual Time Calculation (min): 57 min    Short Term Goals: Week 2:  OT Short Term Goal 1 (Week 2): STG=LTG secondary to ELOS  Skilled Therapeutic Interventions/Progress Updates:    Pt engaged in ongoing BADL retraining including bathing at shower level and dressing with sit<>stand from seat.  Pt amb with SPC in room to gather clothing prior to entering bathroom to use toilet and completed bathing/dressing tasks.  Pt performs toilet transfer/toileting at mod I level and all bathing/dressing tasks at supervision level.  Pt employs compensatory techniques/strategies during bathing/dressing tasks.  Pt completed grooming tasks while standing at sink.  Pt initiates use of RUE as gross stabilizer and employs HOH/AAROM strategies to incorporate use of RUE during bathing tasks.  Reviewed safety recommendations when discharged.  Pt verbalized understanding.  Pt amb with SPC to recliner and remained seated with all needs within reach.   Therapy Documentation Precautions:  Precautions Precautions: Fall Restrictions Weight Bearing Restrictions: No  Pain:  Pt denies pain  See Function Navigator for Current Functional Status.   Therapy/Group: Individual Therapy  Rich BraveLanier, Tripp Goins Chappell 07/29/2017, 10:00 AM

## 2017-07-29 NOTE — Progress Notes (Addendum)
Physical Therapy Discharge Summary  Patient Details  Name: Kristi David MRN: [id number] Date of Birth: January 09, 1941  Today's Date: 07/29/2017 PT Individual Time: 1100-1200 PT Individual Time Calculation (min): 60 min    Patient has met 10 of 10 long term goals due to improved activity tolerance, improved balance, improved postural control, increased strength, ability to compensate for deficits, functional use of  right lower extremity, improved attention, improved awareness and improved coordination.  Patient to discharge at an ambulatory level Modified Independent.   Patient's care partner is independent to provide the necessary physical and cognitive intermittent assistance at discharge.  Reasons goals not met: All goals met; ambulation goals surpassed and pt ambulating modI in home environment.  Recommendation:  Patient will benefit from ongoing skilled PT services in home health setting to continue to advance safe functional mobility, address ongoing impairments in strength, balance, coordination, activity tolerance, and minimize fall risk.  Equipment: SPC  Reasons for discharge: treatment goals met and discharge from hospital  Patient/family agrees with progress made and goals achieved: Yes  PT Discharge Precautions/Restrictions  R hemiparesis Vital Signs Therapy Vitals Temp: 98.6 F (37 C) Temp Source: Oral Pulse Rate: 62 Resp: 18 BP: (!) 137/57 Patient Position (if appropriate): Lying Oxygen Therapy SpO2: 100 % O2 Device: Not Delivered Pain Pain Assessment Pain Assessment: No/denies pain Vision/Perception  Vision - Assessment Eye Alignment: Within Functional Limits Ocular Range of Motion: Within Functional Limits Alignment/Gaze Preference: Within Defined Limits Perception Perception: Within Functional Limits Praxis Praxis: Intact  Cognition Overall Cognitive Status: Within Functional Limits for tasks assessed Arousal/Alertness: Awake/alert Orientation Level:  Oriented X4 Attention: Sustained Sustained Attention: Appears intact Memory: Appears intact Memory Impairment: Decreased short term memory;Decreased recall of new information Decreased Short Term Memory: Verbal basic;Functional basic Awareness: Appears intact Awareness Impairment: Emergent impairment Problem Solving: Appears intact Problem Solving Impairment: Functional complex;Verbal complex Executive Function: Self Correcting;Self Monitoring;Reasoning Reasoning: Appears intact Reasoning Impairment: Verbal complex;Functional complex Self Monitoring: Appears intact Self Correcting: Appears intact Safety/Judgment: Appears intact Sensation Sensation Light Touch: Appears Intact Stereognosis: Appears Intact Hot/Cold: Appears Intact Proprioception: Appears Intact Coordination Gross Motor Movements are Fluid and Coordinated: No Fine Motor Movements are Fluid and Coordinated: No Coordination and Movement Description: LUE intact, RUE impaired 2/2 hemiplegia Motor  Motor Motor: Hemiplegia Motor - Skilled Clinical Observations: R hemiparesis UE>LE  Mobility Bed Mobility Bed Mobility: Sit to Supine;Supine to Sit Supine to Sit: 6: Modified independent (Device/Increase time) Sit to Supine: 6: Modified independent (Device/Increase time) Transfers Transfers: Yes Sit to Stand: 6: Modified independent (Device/Increase time);With upper extremity assist Stand Pivot Transfers: 6: Modified independent (Device/Increase time) Locomotion  Ambulation Ambulation: Yes Ambulation/Gait Assistance: 6: Modified independent (Device/Increase time) Ambulation Distance (Feet): 200 Feet Assistive device: Straight cane Gait Gait: Yes Gait Pattern: Impaired Gait Pattern: Lateral hip instability;Poor foot clearance - right Gait velocity: 1.7 ft/sec Stairs / Additional Locomotion Stairs: Yes Stairs Assistance: 5: Supervision Stairs Assistance Details: Verbal cues for technique Stair Management  Technique: One rail Left;Forwards;Alternating pattern Number of Stairs: 12 Height of Stairs: 6 Ramp: 5: Supervision Curb: 5: Supervision Wheelchair Mobility Wheelchair Mobility: No (pt ambulatory)  Trunk/Postural Assessment  Cervical Assessment Cervical Assessment: Within Functional Limits Thoracic Assessment Thoracic Assessment: Within Functional Limits Lumbar Assessment Lumbar Assessment: Within Functional Limits Postural Control Postural Control: Within Functional Limits  Balance Balance Balance Assessed: Yes Standardized Balance Assessment Standardized Balance Assessment: Berg Balance Test;Timed Up and Go Test Berg Balance Test Sit to Stand: Able to stand without using hands and stabilize independently Standing  Unsupported: Able to stand safely 2 minutes Sitting with Back Unsupported but Feet Supported on Floor or Stool: Able to sit safely and securely 2 minutes Stand to Sit: Sits safely with minimal use of hands Transfers: Able to transfer safely, minor use of hands Standing Unsupported with Eyes Closed: Able to stand 10 seconds safely Standing Ubsupported with Feet Together: Able to place feet together independently and stand for 1 minute with supervision From Standing, Reach Forward with Outstretched Arm: Can reach forward >12 cm safely (5") From Standing Position, Pick up Object from Floor: Able to pick up shoe safely and easily From Standing Position, Turn to Look Behind Over each Shoulder: Looks behind from both sides and weight shifts well Turn 360 Degrees: Able to turn 360 degrees safely but slowly Standing Unsupported, Alternately Place Feet on Step/Stool: Able to complete >2 steps/needs minimal assist Standing Unsupported, One Foot in Front: Able to plae foot ahead of the other independently and hold 30 seconds Standing on One Leg: Able to lift leg independently and hold equal to or more than 3 seconds Total Score: 46 Timed Up and Go Test TUG: Normal TUG Normal  TUG (seconds): 21 Static Sitting Balance Static Sitting - Balance Support: Feet supported;No upper extremity supported Static Sitting - Level of Assistance: 6: Modified independent (Device/Increase time) Dynamic Sitting Balance Dynamic Sitting - Balance Support: Feet supported;No upper extremity supported Dynamic Sitting - Level of Assistance: 6: Modified independent (Device/Increase time) Dynamic Sitting - Balance Activities: Lateral lean/weight shifting;Forward lean/weight shifting;Reaching for objects Static Standing Balance Static Standing - Balance Support: During functional activity;No upper extremity supported Static Standing - Level of Assistance: 6: Modified independent (Device/Increase time) Static Stance: On foam Static Stance: on Foam: x30 sec with S Static Stance: on Foam, Eyes Closed: x30 sec minA Dynamic Standing Balance Dynamic Standing - Balance Support: During functional activity;No upper extremity supported Dynamic Standing - Level of Assistance: 6: Modified independent (Device/Increase time) Extremity Assessment  RUE Assessment RUE Assessment: Exceptions to [facility] (trace shoulder elevation and flexion/extension) LUE Assessment LUE Assessment: Within Functional Limits RLE Assessment RLE Assessment: Exceptions to [facility] RLE Strength RLE Overall Strength: Deficits Right Hip Flexion: 4/5 Right Hip ABduction: 4+/5 Right Hip ADduction: 4+/5 Right Knee Flexion: 4+/5 Right Knee Extension: 4/5 Right Ankle Dorsiflexion: 4/5 Right Ankle Plantar Flexion: 4+/5 LLE Assessment LLE Assessment: Within Functional Limits  Skilled Therapeutic Intervention: Pt received seated in recliner, denies pain and agreeable to treatment. Assessed mobility as described above with modI overall with SPC, S for stairs, uneven surface ambulation, and car transfer. Assessed balance with scores as above indicating significant improvement from evaluation however continued high risk for falls. Educated pt  on risk for falls, recommendation to continue use of AD, reduce cognitive/physical dual task while ambulating. Provided pt with LE strengthening, lumbar ROM HEP and pt performed modI. Returned to room at end of session, all needs in reach. Pt modI in room at this time. No further questions or concerns regarding d/c home at this time.   See Function Navigator for Current Functional Status.  Benjiman Core Tygielski 07/29/2017, 7:46 AM

## 2017-07-29 NOTE — Discharge Summary (Signed)
Discharge summary job [id number]

## 2017-07-29 NOTE — Progress Notes (Signed)
Speech Language Pathology Discharge Summary  Patient Details  Name: Kristi David MRN: [id number] Date of Birth: 11/08/1940  Today's Date: 07/29/2017 SLP Individual Time: 1345-1430 SLP Individual Time Calculation (min): 45 min   Skilled Therapeutic Interventions:  Skilled treatment session focused on cognition goals and completion of education for discharge. Pt has supervision in place with son and daughter. Education provided on diet textures that were least restrictive. Pt's speech intelligibility is ~ 95% at simple conversation during most sessions and is able to state precautions for ambulation within home environment.     Patient has met 4 of 4 long term goals.  Patient to discharge at overall Supervision level.    Clinical Impression/Discharge Summary:   Pt will great progress during skilled ST sessions and as a result she is tolerating dysphagia 3 diet with thin liquids without overt s/s of aspiration. Pt could be upgraded within home environment with some additional observations. Pt would continue to benefit from additional skilled ST to increase speech intelligibility (slow rate, over articulation and increased volume) and to target cognition within home environment.   Care Partner:  Caregiver Able to Provide Assistance: Yes  Type of Caregiver Assistance: Cognitive  Recommendation:  24 hour supervision/assistance  Rationale for SLP Follow Up: Maximize functional communication;Maximize cognitive function and independence;Reduce caregiver burden   Equipment:     Reasons for discharge: Treatment goals met   Patient/Family Agrees with Progress Made and Goals Achieved: Yes   Function:    Cognition Comprehension Comprehension assist level: Follows basic conversation/direction with no assist  Expression   Expression assist level: Expresses basic needs/ideas: With extra time/assistive device  Social Interaction Social Interaction assist level: Interacts appropriately 90% of  the time - Needs monitoring or encouragement for participation or interaction.  Problem Solving Problem solving assist level: Solves basic 90% of the time/requires cueing < 10% of the time  Memory Memory assist level: Recognizes or recalls 75 - 89% of the time/requires cueing 10 - 24% of the time   Annaliese Saez 07/29/2017, 2:02 PM

## 2017-07-29 NOTE — Discharge Summary (Signed)
NAMHarmon David:  Crockett, Zahava               ACCOUNT NO.:  [id number]  MEDICAL RECORD NO.:  [id number]  LOCATION:  [id number]                        FACILITY:  [facility]  PHYSICIAN:  Ranelle OysterZachary T. Swartz, M.D.DATE OF BIRTH:  10-Jan-1941  DATE OF ADMISSION:  07/18/2017 DATE OF DISCHARGE:  07/30/2017                              DISCHARGE SUMMARY   DISCHARGE DIAGNOSES: 1. Left basal ganglia infarction secondary to small vessel disease. 2. SCDs for deep venous thrombosis prophylaxis. 3. Pain management. 4. Depression. 5. Dysphagia. 6. Hypertension. 7. Hyperlipidemia. 8. Diabetes mellitus. 9. Peripheral neuropathy.  HISTORY OF PRESENT ILLNESS:  This is a 76 year old right-handed female with history of hypertension, diabetes mellitus, hyperlipidemia.  The patient lives alone, independent prior to admission.  Does not drive. Presented July 16, 2017, with right-sided weakness and slurred speech.  Cranial CT scan showed areas of acute to subacute ischemia within the centrum semiovale on the left.  No other acute abnormality. She did not receive tPA.  MRI showed left basal ganglia infarction.  MRA with no large vessel occlusion, aneurysm, or stenosis.  CT angiogram of the neck, no extracranial flow reducing lesion or dissection identified. Lower extremity negative for DVT.  Echocardiogram with ejection fraction of 60%, grade 1 diastolic dysfunction.  Maintained on Plavix for CVA prophylaxis.  Dysphagia #2 nectar thick liquid diet.  Physical and occupational therapy ongoing.  The patient was admitted for a comprehensive rehab program.  PAST MEDICAL HISTORY:  See discharge diagnoses.  SOCIAL HISTORY:  Lives alone, independent prior to admission. Functional status upon admission to rehab services, minimal guard, 25 feet rolling walker; minimal assist, sit to stand; min-to-mod assist, activities of daily living.  PHYSICAL EXAMINATION:  VITAL SIGNS:  Blood pressure 152/74, pulse 75, temperature 98,  respirations 18. GENERAL:  Alert female in no acute distress.  Followed simple commands. Mild dysarthria. HEENT:  EOMs intact. NECK:  Supple.  Nontender.  No JVD. CARDIAC:  Rate controlled. ABDOMEN:  Soft, nontender.  Good bowel sounds. LUNGS:  Clear to auscultation without wheeze.  REHABILITATION HOSPITAL COURSE:  The patient was admitted to inpatient rehab services with therapies initiated on a 3-hour daily basis consisting of physical therapy, occupational therapy, speech therapy, and rehabilitation nursing.  The following issues were addressed during the patient's rehabilitation stay.  Pertaining to Ms. Janowicz, left basal ganglia infarction remained stable.  She continued on Plavix therapy. She would follow up with Neurology Services.  SCDs for DVT prophylaxis. No signs of DVT.  Pain management, use of Voltaren gel as needed.  She did have a history of depression.  She remained on Celexa, attending full therapies.  Emotional support provided.  Blood pressures controlled on Cozaar as well as Toprol.  Her diet had been advanced to a mechanical soft.  Noted history of diabetes mellitus, peripheral neuropathy. Hemoglobin A1c of 5.5, full diabetic teaching completed.  The patient received weekly collaborative interdisciplinary team conferences to discuss estimated length of stay, family teaching, any barriers to discharge.  Performed bed mobility, modified independent.  She could put on her pants and shoes, modified independence.  Ambulates to the gym with a straight-point cane and supervision.  Working with energy conservation techniques.  Needing occasional cues  for sequencing, activities of daily living, and homemaking.  She could gather her belongings for activities of daily living and hygiene.  Ambulates to the bathroom using her straight point cane, focusing on safety.  She was able to fully communicate her needs.  Tolerating her current mechanical soft diet.  Full family  teaching was completed and plan discharge to home.  DISCHARGE MEDICATIONS:  Included: 1. Lipitor 80 mg p.o. daily. 2. Celexa 40 mg p.o. daily. 3. Plavix 75 mg p.o. daily. 4. Cozaar 100 mg p.o. daily. 5. Toprol-XL 25 mg p.o. daily. 6. Voltaren gel 4 times daily to affected area as needed.  DIET:  Mechanical soft.  FOLLOWUP:  She would follow up with Dr. Faith Rogue at the outpatient rehab service office as directed; Dr. Roda Shutters, Neurology Services, call for appointment; Dr. Sigmund Hazel, Medical Management.     Mariam Dollar, P.A.   ______________________________ Ranelle Oyster, M.D.    DA/MEDQ  D:  07/29/2017  T:  07/29/2017  Job:  [id number]  cc:   Dr. Ventura Sellers, M.D.

## 2017-07-30 LAB — GLUCOSE, CAPILLARY: GLUCOSE-CAPILLARY: 93 mg/dL (ref 65–99)

## 2017-07-30 NOTE — Progress Notes (Signed)
Discharged to home per wheelchair with grandson and belongings. No co distress at this time

## 2017-07-30 NOTE — Progress Notes (Signed)
Patient ID: Kristi David, female   DOB: 1940-10-27, 76 y.o.   MRN: [id number]   07/30/17.  Kristi David is a 76 y.o. female who  was admitted for CIR with Right sided weakness and dysarthria secondary to left basal ganglia infarction secondary to small vessel disease.   Past Medical History:  Diagnosis Date  . Acute blood loss anemia   . Anemia   . Arthritis    "legs, knees" (01/07/2016)  . Arthritis of right hand 01/11/2017  . Diabetes mellitus (HCC)   . Diastolic dysfunction   . Dysarthria, post-stroke   . Dysphagia, post-stroke   . Facial droop   . GERD (gastroesophageal reflux disease)    no meds  . High cholesterol   . History of blood transfusion   . Hyperlipidemia   . Hypertension   . Hypokalemia   . Left basal ganglia embolic stroke (HCC) 07/18/2017  . Osteoarthritis of right knee 01/06/2016  . Seasonal allergies   . Shortness of breath dyspnea    if too active  . Stroke (HCC) 07/16/2017  . Syncope    passed out and was hospitalized for a couple of days  . Type II diabetes mellitus (HCC)    does not take medicine diet controlled     Subjective: No new complaints.  Excited about today's discharge.  No concerns  Objective: Vital signs in last 24 hours: Temp:  [98 F (36.7 C)-98.2 F (36.8 C)] 98 F (36.7 C) (11/03 0500) Pulse Rate:  [66] 66 (11/03 0500) Resp:  [16] 16 (11/03 0500) BP: (129-134)/(55-62) 129/62 (11/03 0500) SpO2:  [97 %-99 %] 97 % (11/03 0500) Weight change:  Last BM Date: 07/29/17  Intake/Output from previous day: 11/02 0701 - 11/03 0700 In: 360 [P.O.:360] Out: -  Last cbgs: CBG (last 3)   Recent Labs  07/29/17 1159 07/29/17 2110 07/30/17 0624  GLUCAP 107* 98 93     Physical Exam General: No apparent distress   HEENT: not dry Lungs: Normal effort. Lungs clear to auscultation, no crackles or wheezes. Cardiovascular: Regular rate and rhythm, no edema Abdomen: S/NT/ND; BS(+) Musculoskeletal:  unchanged Neurological: No new  neurological deficits with a dense right arm  paresis Wounds: N/A    Skin: clear   Mental state: Alert, oriented, cooperative  BP Readings from Last 3 Encounters:  07/30/17 129/62  07/18/17 (!) 143/59  01/08/16 (!) 146/58    Lab Results: BMET    Component Value Date/Time   NA 140 07/19/2017 0856   K 3.5 07/19/2017 0856   CL 109 07/19/2017 0856   CO2 21 (L) 07/19/2017 0856   GLUCOSE 126 (H) 07/19/2017 0856   BUN 11 07/19/2017 0856   CREATININE 0.87 07/19/2017 0856   CALCIUM 9.1 07/19/2017 0856   GFRNONAA >60 07/19/2017 0856   GFRAA >60 07/19/2017 0856   CBC    Component Value Date/Time   WBC 4.7 07/19/2017 0856   RBC 4.39 07/19/2017 0856   HGB 12.2 07/19/2017 0856   HCT 37.2 07/19/2017 0856   HCT 34.5 07/17/2017 0658   PLT 222 07/19/2017 0856   MCV 84.7 07/19/2017 0856   MCH 27.8 07/19/2017 0856   MCHC 32.8 07/19/2017 0856   RDW 14.5 07/19/2017 0856   LYMPHSABS 1.8 07/19/2017 0856   MONOABS 0.2 07/19/2017 0856   EOSABS 0.1 07/19/2017 0856   BASOSABS 0.0 07/19/2017 0856    Medications: I have reviewed the patient's current medications.  Assessment/Plan:  Right sided weakness and dysarthria secondary to left basal ganglia  infarction secondary to small vessel disease.  Stable for discharge  Hypertension.  Well-controlled Diabetes mellitus.  Stable Dyslipidemia.  Continue statin therapy    Length of stay, days: 12  Kristi David,Kristi David , MD 07/30/2017, 10:33 AM

## 2017-08-01 ENCOUNTER — Telehealth: Payer: Self-pay | Admitting: *Deleted

## 2017-08-01 DIAGNOSIS — I69391 Dysphagia following cerebral infarction: Secondary | ICD-10-CM | POA: Diagnosis not present

## 2017-08-01 DIAGNOSIS — M1991 Primary osteoarthritis, unspecified site: Secondary | ICD-10-CM | POA: Diagnosis not present

## 2017-08-01 DIAGNOSIS — E785 Hyperlipidemia, unspecified: Secondary | ICD-10-CM | POA: Diagnosis not present

## 2017-08-01 DIAGNOSIS — I10 Essential (primary) hypertension: Secondary | ICD-10-CM | POA: Diagnosis not present

## 2017-08-01 DIAGNOSIS — I69318 Other symptoms and signs involving cognitive functions following cerebral infarction: Secondary | ICD-10-CM | POA: Diagnosis not present

## 2017-08-01 DIAGNOSIS — R1312 Dysphagia, oropharyngeal phase: Secondary | ICD-10-CM | POA: Diagnosis not present

## 2017-08-01 DIAGNOSIS — E1142 Type 2 diabetes mellitus with diabetic polyneuropathy: Secondary | ICD-10-CM | POA: Diagnosis not present

## 2017-08-01 DIAGNOSIS — I69351 Hemiplegia and hemiparesis following cerebral infarction affecting right dominant side: Secondary | ICD-10-CM | POA: Diagnosis not present

## 2017-08-01 DIAGNOSIS — I69322 Dysarthria following cerebral infarction: Secondary | ICD-10-CM | POA: Diagnosis not present

## 2017-08-01 DIAGNOSIS — I69392 Facial weakness following cerebral infarction: Secondary | ICD-10-CM | POA: Diagnosis not present

## 2017-08-01 DIAGNOSIS — D649 Anemia, unspecified: Secondary | ICD-10-CM | POA: Diagnosis not present

## 2017-08-01 NOTE — Telephone Encounter (Signed)
Eval done and requesting 2wk4 and also needs OT for ADL like cooking and things in the kitchen.  Approval given.

## 2017-08-02 ENCOUNTER — Telehealth: Payer: Self-pay

## 2017-08-02 DIAGNOSIS — R1312 Dysphagia, oropharyngeal phase: Secondary | ICD-10-CM | POA: Diagnosis not present

## 2017-08-02 DIAGNOSIS — I69318 Other symptoms and signs involving cognitive functions following cerebral infarction: Secondary | ICD-10-CM | POA: Diagnosis not present

## 2017-08-02 DIAGNOSIS — I69392 Facial weakness following cerebral infarction: Secondary | ICD-10-CM | POA: Diagnosis not present

## 2017-08-02 DIAGNOSIS — I69391 Dysphagia following cerebral infarction: Secondary | ICD-10-CM | POA: Diagnosis not present

## 2017-08-02 DIAGNOSIS — I69351 Hemiplegia and hemiparesis following cerebral infarction affecting right dominant side: Secondary | ICD-10-CM | POA: Diagnosis not present

## 2017-08-02 DIAGNOSIS — I69322 Dysarthria following cerebral infarction: Secondary | ICD-10-CM | POA: Diagnosis not present

## 2017-08-02 NOTE — Telephone Encounter (Signed)
Allen DerryAllison Mallory [facility] path[facility] verbal orders for 2 times a wk for 2 wks, and 1 time a wk for 2 wks.

## 2017-08-02 NOTE — Telephone Encounter (Signed)
Called Kristi David back and gave doctors approval

## 2017-08-02 NOTE — Telephone Encounter (Signed)
That would be fine 

## 2017-08-03 DIAGNOSIS — I69392 Facial weakness following cerebral infarction: Secondary | ICD-10-CM | POA: Diagnosis not present

## 2017-08-03 DIAGNOSIS — I69322 Dysarthria following cerebral infarction: Secondary | ICD-10-CM | POA: Diagnosis not present

## 2017-08-03 DIAGNOSIS — I69351 Hemiplegia and hemiparesis following cerebral infarction affecting right dominant side: Secondary | ICD-10-CM | POA: Diagnosis not present

## 2017-08-03 DIAGNOSIS — I69391 Dysphagia following cerebral infarction: Secondary | ICD-10-CM | POA: Diagnosis not present

## 2017-08-03 DIAGNOSIS — R1312 Dysphagia, oropharyngeal phase: Secondary | ICD-10-CM | POA: Diagnosis not present

## 2017-08-03 DIAGNOSIS — I69318 Other symptoms and signs involving cognitive functions following cerebral infarction: Secondary | ICD-10-CM | POA: Diagnosis not present

## 2017-08-05 ENCOUNTER — Encounter: Payer: Self-pay | Admitting: Interventional Cardiology

## 2017-08-05 ENCOUNTER — Encounter (INDEPENDENT_AMBULATORY_CARE_PROVIDER_SITE_OTHER): Payer: Self-pay

## 2017-08-05 ENCOUNTER — Telehealth: Payer: Self-pay | Admitting: *Deleted

## 2017-08-05 ENCOUNTER — Ambulatory Visit (INDEPENDENT_AMBULATORY_CARE_PROVIDER_SITE_OTHER): Payer: Medicare Other | Admitting: Interventional Cardiology

## 2017-08-05 VITALS — BP 110/72 | HR 67 | Ht 67.0 in | Wt 192.6 lb

## 2017-08-05 DIAGNOSIS — I69322 Dysarthria following cerebral infarction: Secondary | ICD-10-CM | POA: Diagnosis not present

## 2017-08-05 DIAGNOSIS — E1159 Type 2 diabetes mellitus with other circulatory complications: Secondary | ICD-10-CM

## 2017-08-05 DIAGNOSIS — E782 Mixed hyperlipidemia: Secondary | ICD-10-CM | POA: Diagnosis not present

## 2017-08-05 DIAGNOSIS — I69391 Dysphagia following cerebral infarction: Secondary | ICD-10-CM | POA: Diagnosis not present

## 2017-08-05 DIAGNOSIS — I69351 Hemiplegia and hemiparesis following cerebral infarction affecting right dominant side: Secondary | ICD-10-CM | POA: Diagnosis not present

## 2017-08-05 DIAGNOSIS — I69392 Facial weakness following cerebral infarction: Secondary | ICD-10-CM | POA: Diagnosis not present

## 2017-08-05 DIAGNOSIS — I10 Essential (primary) hypertension: Secondary | ICD-10-CM | POA: Diagnosis not present

## 2017-08-05 DIAGNOSIS — R1312 Dysphagia, oropharyngeal phase: Secondary | ICD-10-CM | POA: Diagnosis not present

## 2017-08-05 DIAGNOSIS — R0609 Other forms of dyspnea: Secondary | ICD-10-CM

## 2017-08-05 DIAGNOSIS — I63312 Cerebral infarction due to thrombosis of left middle cerebral artery: Secondary | ICD-10-CM

## 2017-08-05 DIAGNOSIS — I69318 Other symptoms and signs involving cognitive functions following cerebral infarction: Secondary | ICD-10-CM | POA: Diagnosis not present

## 2017-08-05 NOTE — Telephone Encounter (Signed)
Allen DerryAllison Mallory, ST, [facility] and asked for verbal orders for an OT eval.  Verbal orders were given per office protocol

## 2017-08-05 NOTE — Patient Instructions (Signed)

## 2017-08-05 NOTE — Progress Notes (Signed)
Cardiology Office Note   Date:  08/05/2017   ID:  Kristi David, DOB Jul 08, 1941, MRN [id number]  PCP:  Kathyrn Lass, MD    No chief complaint on file.  Dyspnea on exertion  Wt Readings from Last 3 Encounters:  08/05/17 192 lb 9.6 oz (87.4 kg)  07/17/17 194 lb 0.1 oz (88 kg)  01/06/16 192 lb (87.1 kg)       History of Present Illness: Kristi David is a 76 y.o. female.  Kristi David is a 76 y.o. female who is being seen today for the evaluation of dyspnea on exertion at the request of Kathyrn Lass, MD.  She has a history of obesity, hypertension, hyperlipidemia,, diabetes and stroke.  She has had dyspnea on exertion over the past several months.  She had a CT scan showing no evidence of pulmonary embolism.  There was aortic and coronary atherosclerosis noted by CT.  She is here for further evaluation.  Echocardiogram was done a few weeks ago showing:  - Upper normal LV wall thickness with LVEF 55-60% and grade 1   diastolic dysfunction. Mildly calcified mitral annulus with mild   mitral regurgitation. Trivial aortic regurgitation. Mild   tricuspid regurgitation with PASP 28 mmHg. No obvious PFO or ASD.  There is a report of a normal stress test in 2016.  Those results are unavailable to me.  Tis was done in [place] , [place] most exercise she does is stretching.  SHe does not get her HR elevated much.  With walking, she feels some DOE.  No swelling or trouble lying flat.     Past Medical History:  Diagnosis Date  . Acute blood loss anemia   . Anemia   . Arthritis    "legs, knees" (01/07/2016)  . Arthritis of right hand 01/11/2017  . Diabetes mellitus ([place])   . Diastolic dysfunction   . Dysarthria, post-stroke   . Dysphagia, post-stroke   . Facial droop   . GERD (gastroesophageal reflux disease)    no meds  . High cholesterol   . History of blood transfusion   . Hyperlipidemia   . Hypertension   . Hypokalemia   . Left basal ganglia embolic stroke ([place])  24/23/5361  . Osteoarthritis of right knee 01/06/2016  . Seasonal allergies   . Shortness of breath dyspnea    if too active  . Stroke ([place]) 07/16/2017  . Syncope    passed out and was hospitalized for a couple of days  . Type II diabetes mellitus ([place])    does not take medicine diet controlled    Past Surgical History:  Procedure Laterality Date  . BUNIONECTOMY Bilateral   . CATARACT EXTRACTION W/ INTRAOCULAR LENS  IMPLANT, BILATERAL Bilateral   . INGUINAL HERNIA REPAIR Right   . KNEE ARTHROSCOPY WITH EXCISION BAKER'S CYST Right   . MR LOWER LEG LEFT ([facility] HX) Left    after a break  . VEIN LIGATION AND STRIPPING Bilateral      Current Outpatient Medications  Medication Sig Dispense Refill  . atorvastatin (LIPITOR) 80 MG tablet Take 1 tablet (80 mg total) by mouth daily at 6 PM. 30 tablet 0  . blood glucose meter kit and supplies Dispense based on patient and insurance preference. Use up to four times daily as directed. (FOR ICD-9 250.00, 250.01). 1 each 0  . citalopram (CELEXA) 40 MG tablet Take 1 tablet (40 mg total) by mouth daily. 30 tablet 0  . clopidogrel (PLAVIX) 75 MG  tablet Take 1 tablet (75 mg total) by mouth daily. 30 tablet 0  . diclofenac sodium (VOLTAREN) 1 % GEL Apply 2 g topically 4 (four) times daily. 3 Tube 3  . HORIZANT 600 MG TBCR take 1 tablet by mouth daily 60 tablet 3  . losartan (COZAAR) 100 MG tablet Take 1 tablet (100 mg total) by mouth daily. 30 tablet 0  . metoprolol succinate (TOPROL-XL) 25 MG 24 hr tablet Take 1 tablet (25 mg total) by mouth daily. 30 tablet 0  . solifenacin (VESICARE) 10 MG tablet Take 10 mg by mouth daily as needed (frequent urination).      No current facility-administered medications for this visit.     Allergies:   Oregano [origanum oil]; Peanut-containing drug products; and Tomato    Social History:  The patient  reports that  has never smoked. she has never used smokeless tobacco. She reports that she does not drink  alcohol or use drugs.   Family History:  The patient's family history includes CAD in her father; Stroke in her mother.    ROS:  Please see the history of present illness.   Otherwise, review of systems are positive for DOE, residual right sided weakness from prior stroke.   All other systems are reviewed and negative.    PHYSICAL EXAM: VS:  BP 110/72   Pulse 67   Ht '5\' 7"'  (1.702 m)   Wt 192 lb 9.6 oz (87.4 kg)   SpO2 99%   BMI 30.17 kg/m  , BMI Body mass index is 30.17 kg/m. GEN: Well nourished, well developed, in no acute distress  HEENT: normal  Neck: no JVD, carotid bruits, or masses Cardiac: RRR; no murmurs, rubs, or gallops,no edema  Respiratory:  clear to auscultation bilaterally, normal work of breathing GI: soft, nontender, nondistended, + BS MS: no deformity or atrophy  Skin: warm and dry, no rash Neuro:  Strength and sensation are intact Psych: euthymic mood, full affect   EKG:   The ekg ordered today demonstrates NSR, nonspecific ST changes   Recent Labs: 07/17/2017: TSH 2.933 07/19/2017: ALT 17; BUN 11; Creatinine, Ser 0.87; Hemoglobin 12.2; Platelets 222; Potassium 3.5; Sodium 140   Lipid Panel    Component Value Date/Time   CHOL 250 (H) 07/17/2017 0658   TRIG 85 07/17/2017 0658   HDL 58 07/17/2017 0658   CHOLHDL 4.3 07/17/2017 0658   VLDL 17 07/17/2017 0658   LDLCALC 175 (H) 07/17/2017 3893     Other studies Reviewed: Additional studies/ records that were reviewed today with results demonstrating: 2018 echo reviewed.   ASSESSMENT AND PLAN:  1. Dyspnea on exertion: Likely multifactorial.  Patient with prior stroke and limited activity.  I am sure there is a component of deconditioning.  Echocardiogram showed normal LV function.  Would like to see the results of her stress test from a few years ago.  It certainly is possible that her coronary calcifications are not hemodynamically significant.  In addition, she is not having chest pain per se.  Would  hold off on any ischemic workup at this time.  She may benefit more from a stationary bike, and trying to increase cardio exercise gradually.  She does not show evidence of volume overload.  Chest x-ray reviewed and showed no evidence of pulmonary edema. Discussed with daughter. 2. Hyperlipidemia: Continue atorvastatin.  LDL target less than 100 given her CVA.  It is unclear to me whether her atorvastatin is a new medicine.  If she has been  on the atorvastatin for a while, would have to consider adding ezetimibe. 3. Hypertension: Blood pressure well controlled. COntinue current meds. 4. Diabetes: Continue healthy diet and attempts at keeping blood sugar down.  Last A1c was 5.5.   Current medicines are reviewed at length with the patient today.  The patient concerns regarding her medicines were addressed.  The following changes have been made:  No change  Labs/ tests ordered today include:  No orders of the defined types were placed in this encounter.   Recommend 150 minutes/week of aerobic exercise Low fat, low carb, high fiber diet recommended  Disposition:   FU in 6 months   Signed, Larae Grooms, MD  08/05/2017 9:45 AM    [facility] [address], [place], [place]  [address] Phone: [phone]; Fax: [phone]

## 2017-08-08 ENCOUNTER — Telehealth: Payer: Self-pay | Admitting: Interventional Cardiology

## 2017-08-08 DIAGNOSIS — R29898 Other symptoms and signs involving the musculoskeletal system: Secondary | ICD-10-CM | POA: Diagnosis not present

## 2017-08-08 DIAGNOSIS — I69322 Dysarthria following cerebral infarction: Secondary | ICD-10-CM | POA: Diagnosis not present

## 2017-08-08 DIAGNOSIS — Z8673 Personal history of transient ischemic attack (TIA), and cerebral infarction without residual deficits: Secondary | ICD-10-CM | POA: Diagnosis not present

## 2017-08-08 NOTE — Telephone Encounter (Signed)
[facility] placed in Chart Prep.

## 2017-08-09 DIAGNOSIS — I69391 Dysphagia following cerebral infarction: Secondary | ICD-10-CM | POA: Diagnosis not present

## 2017-08-09 DIAGNOSIS — I69322 Dysarthria following cerebral infarction: Secondary | ICD-10-CM | POA: Diagnosis not present

## 2017-08-09 DIAGNOSIS — I69318 Other symptoms and signs involving cognitive functions following cerebral infarction: Secondary | ICD-10-CM | POA: Diagnosis not present

## 2017-08-09 DIAGNOSIS — I69351 Hemiplegia and hemiparesis following cerebral infarction affecting right dominant side: Secondary | ICD-10-CM | POA: Diagnosis not present

## 2017-08-09 DIAGNOSIS — R1312 Dysphagia, oropharyngeal phase: Secondary | ICD-10-CM | POA: Diagnosis not present

## 2017-08-09 DIAGNOSIS — I69392 Facial weakness following cerebral infarction: Secondary | ICD-10-CM | POA: Diagnosis not present

## 2017-08-10 DIAGNOSIS — I69322 Dysarthria following cerebral infarction: Secondary | ICD-10-CM | POA: Diagnosis not present

## 2017-08-10 DIAGNOSIS — R1312 Dysphagia, oropharyngeal phase: Secondary | ICD-10-CM | POA: Diagnosis not present

## 2017-08-10 DIAGNOSIS — I69392 Facial weakness following cerebral infarction: Secondary | ICD-10-CM | POA: Diagnosis not present

## 2017-08-10 DIAGNOSIS — I69391 Dysphagia following cerebral infarction: Secondary | ICD-10-CM | POA: Diagnosis not present

## 2017-08-10 DIAGNOSIS — I69318 Other symptoms and signs involving cognitive functions following cerebral infarction: Secondary | ICD-10-CM | POA: Diagnosis not present

## 2017-08-10 DIAGNOSIS — I69351 Hemiplegia and hemiparesis following cerebral infarction affecting right dominant side: Secondary | ICD-10-CM | POA: Diagnosis not present

## 2017-08-12 DIAGNOSIS — I69391 Dysphagia following cerebral infarction: Secondary | ICD-10-CM | POA: Diagnosis not present

## 2017-08-12 DIAGNOSIS — I69392 Facial weakness following cerebral infarction: Secondary | ICD-10-CM | POA: Diagnosis not present

## 2017-08-12 DIAGNOSIS — R1312 Dysphagia, oropharyngeal phase: Secondary | ICD-10-CM | POA: Diagnosis not present

## 2017-08-12 DIAGNOSIS — I69318 Other symptoms and signs involving cognitive functions following cerebral infarction: Secondary | ICD-10-CM | POA: Diagnosis not present

## 2017-08-12 DIAGNOSIS — I69322 Dysarthria following cerebral infarction: Secondary | ICD-10-CM | POA: Diagnosis not present

## 2017-08-12 DIAGNOSIS — I69351 Hemiplegia and hemiparesis following cerebral infarction affecting right dominant side: Secondary | ICD-10-CM | POA: Diagnosis not present

## 2017-08-15 DIAGNOSIS — I69392 Facial weakness following cerebral infarction: Secondary | ICD-10-CM | POA: Diagnosis not present

## 2017-08-15 DIAGNOSIS — I10 Essential (primary) hypertension: Secondary | ICD-10-CM | POA: Diagnosis not present

## 2017-08-15 DIAGNOSIS — E785 Hyperlipidemia, unspecified: Secondary | ICD-10-CM | POA: Diagnosis not present

## 2017-08-15 DIAGNOSIS — I69322 Dysarthria following cerebral infarction: Secondary | ICD-10-CM | POA: Diagnosis not present

## 2017-08-15 DIAGNOSIS — R1312 Dysphagia, oropharyngeal phase: Secondary | ICD-10-CM | POA: Diagnosis not present

## 2017-08-15 DIAGNOSIS — I69351 Hemiplegia and hemiparesis following cerebral infarction affecting right dominant side: Secondary | ICD-10-CM | POA: Diagnosis not present

## 2017-08-15 DIAGNOSIS — E1142 Type 2 diabetes mellitus with diabetic polyneuropathy: Secondary | ICD-10-CM | POA: Diagnosis not present

## 2017-08-15 DIAGNOSIS — I69391 Dysphagia following cerebral infarction: Secondary | ICD-10-CM | POA: Diagnosis not present

## 2017-08-15 DIAGNOSIS — M1991 Primary osteoarthritis, unspecified site: Secondary | ICD-10-CM | POA: Diagnosis not present

## 2017-08-15 DIAGNOSIS — I69318 Other symptoms and signs involving cognitive functions following cerebral infarction: Secondary | ICD-10-CM | POA: Diagnosis not present

## 2017-08-15 DIAGNOSIS — D649 Anemia, unspecified: Secondary | ICD-10-CM | POA: Diagnosis not present

## 2017-08-16 DIAGNOSIS — I69351 Hemiplegia and hemiparesis following cerebral infarction affecting right dominant side: Secondary | ICD-10-CM | POA: Diagnosis not present

## 2017-08-16 DIAGNOSIS — R1312 Dysphagia, oropharyngeal phase: Secondary | ICD-10-CM | POA: Diagnosis not present

## 2017-08-16 DIAGNOSIS — I69322 Dysarthria following cerebral infarction: Secondary | ICD-10-CM | POA: Diagnosis not present

## 2017-08-16 DIAGNOSIS — I69318 Other symptoms and signs involving cognitive functions following cerebral infarction: Secondary | ICD-10-CM | POA: Diagnosis not present

## 2017-08-16 DIAGNOSIS — I69392 Facial weakness following cerebral infarction: Secondary | ICD-10-CM | POA: Diagnosis not present

## 2017-08-16 DIAGNOSIS — I69391 Dysphagia following cerebral infarction: Secondary | ICD-10-CM | POA: Diagnosis not present

## 2017-08-17 ENCOUNTER — Telehealth (INDEPENDENT_AMBULATORY_CARE_PROVIDER_SITE_OTHER): Payer: Self-pay | Admitting: Orthopaedic Surgery

## 2017-08-17 ENCOUNTER — Other Ambulatory Visit (INDEPENDENT_AMBULATORY_CARE_PROVIDER_SITE_OTHER): Payer: Self-pay

## 2017-08-17 DIAGNOSIS — I69351 Hemiplegia and hemiparesis following cerebral infarction affecting right dominant side: Secondary | ICD-10-CM | POA: Diagnosis not present

## 2017-08-17 DIAGNOSIS — I69322 Dysarthria following cerebral infarction: Secondary | ICD-10-CM | POA: Diagnosis not present

## 2017-08-17 DIAGNOSIS — R1312 Dysphagia, oropharyngeal phase: Secondary | ICD-10-CM | POA: Diagnosis not present

## 2017-08-17 DIAGNOSIS — I69318 Other symptoms and signs involving cognitive functions following cerebral infarction: Secondary | ICD-10-CM | POA: Diagnosis not present

## 2017-08-17 DIAGNOSIS — I69391 Dysphagia following cerebral infarction: Secondary | ICD-10-CM | POA: Diagnosis not present

## 2017-08-17 DIAGNOSIS — I69392 Facial weakness following cerebral infarction: Secondary | ICD-10-CM | POA: Diagnosis not present

## 2017-08-17 MED ORDER — GABAPENTIN 600 MG PO TABS: 600.0000 mg | ORAL_TABLET | Freq: Once | ORAL | 0 refills | Status: DC

## 2017-08-17 NOTE — Telephone Encounter (Signed)
Called into pharmacy; patient aware  

## 2017-08-17 NOTE — Telephone Encounter (Signed)
Patient daughter Belinda Blockricilla called advised patient need Rx refilled (Gabapentin) 600mg    The number to contact patient is [phone]. The number to contact Pricilla is [phone]

## 2017-08-17 NOTE — Telephone Encounter (Signed)
Please advise 

## 2017-08-17 NOTE — Telephone Encounter (Signed)
Ok to call this in 

## 2017-08-19 DIAGNOSIS — I69351 Hemiplegia and hemiparesis following cerebral infarction affecting right dominant side: Secondary | ICD-10-CM | POA: Diagnosis not present

## 2017-08-19 DIAGNOSIS — R1312 Dysphagia, oropharyngeal phase: Secondary | ICD-10-CM | POA: Diagnosis not present

## 2017-08-19 DIAGNOSIS — I69391 Dysphagia following cerebral infarction: Secondary | ICD-10-CM | POA: Diagnosis not present

## 2017-08-19 DIAGNOSIS — I69322 Dysarthria following cerebral infarction: Secondary | ICD-10-CM | POA: Diagnosis not present

## 2017-08-19 DIAGNOSIS — I69392 Facial weakness following cerebral infarction: Secondary | ICD-10-CM | POA: Diagnosis not present

## 2017-08-19 DIAGNOSIS — I69318 Other symptoms and signs involving cognitive functions following cerebral infarction: Secondary | ICD-10-CM | POA: Diagnosis not present

## 2017-08-22 DIAGNOSIS — I69391 Dysphagia following cerebral infarction: Secondary | ICD-10-CM | POA: Diagnosis not present

## 2017-08-22 DIAGNOSIS — I69351 Hemiplegia and hemiparesis following cerebral infarction affecting right dominant side: Secondary | ICD-10-CM | POA: Diagnosis not present

## 2017-08-22 DIAGNOSIS — I69322 Dysarthria following cerebral infarction: Secondary | ICD-10-CM | POA: Diagnosis not present

## 2017-08-22 DIAGNOSIS — R1312 Dysphagia, oropharyngeal phase: Secondary | ICD-10-CM | POA: Diagnosis not present

## 2017-08-22 DIAGNOSIS — I69392 Facial weakness following cerebral infarction: Secondary | ICD-10-CM | POA: Diagnosis not present

## 2017-08-22 DIAGNOSIS — I69318 Other symptoms and signs involving cognitive functions following cerebral infarction: Secondary | ICD-10-CM | POA: Diagnosis not present

## 2017-08-23 ENCOUNTER — Telehealth: Payer: Self-pay

## 2017-08-23 DIAGNOSIS — I69351 Hemiplegia and hemiparesis following cerebral infarction affecting right dominant side: Secondary | ICD-10-CM | POA: Diagnosis not present

## 2017-08-23 DIAGNOSIS — I69322 Dysarthria following cerebral infarction: Secondary | ICD-10-CM | POA: Diagnosis not present

## 2017-08-23 DIAGNOSIS — I69318 Other symptoms and signs involving cognitive functions following cerebral infarction: Secondary | ICD-10-CM | POA: Diagnosis not present

## 2017-08-23 DIAGNOSIS — R1312 Dysphagia, oropharyngeal phase: Secondary | ICD-10-CM | POA: Diagnosis not present

## 2017-08-23 DIAGNOSIS — I69391 Dysphagia following cerebral infarction: Secondary | ICD-10-CM | POA: Diagnosis not present

## 2017-08-23 DIAGNOSIS — I69392 Facial weakness following cerebral infarction: Secondary | ICD-10-CM | POA: Diagnosis not present

## 2017-08-23 NOTE — Telephone Encounter (Signed)
Allen DerryAllison Mallory [facility] requesting 1 additional visit for this patient, called her back and approved verbal order.

## 2017-08-25 ENCOUNTER — Other Ambulatory Visit: Payer: Self-pay

## 2017-08-25 DIAGNOSIS — I69322 Dysarthria following cerebral infarction: Secondary | ICD-10-CM | POA: Diagnosis not present

## 2017-08-25 DIAGNOSIS — I69391 Dysphagia following cerebral infarction: Secondary | ICD-10-CM | POA: Diagnosis not present

## 2017-08-25 DIAGNOSIS — I69351 Hemiplegia and hemiparesis following cerebral infarction affecting right dominant side: Secondary | ICD-10-CM | POA: Diagnosis not present

## 2017-08-25 DIAGNOSIS — I69318 Other symptoms and signs involving cognitive functions following cerebral infarction: Secondary | ICD-10-CM | POA: Diagnosis not present

## 2017-08-25 DIAGNOSIS — R1312 Dysphagia, oropharyngeal phase: Secondary | ICD-10-CM | POA: Diagnosis not present

## 2017-08-25 DIAGNOSIS — I69392 Facial weakness following cerebral infarction: Secondary | ICD-10-CM | POA: Diagnosis not present

## 2017-08-25 MED ORDER — ATORVASTATIN CALCIUM 80 MG PO TABS: 80.0000 mg | ORAL_TABLET | Freq: Every day | ORAL | 0 refills | Status: AC

## 2017-08-25 MED ORDER — METOPROLOL SUCCINATE ER 25 MG PO TB24: 25.0000 mg | ORAL_TABLET | Freq: Every day | ORAL | 0 refills | Status: AC

## 2017-08-30 ENCOUNTER — Encounter: Payer: Self-pay | Admitting: Physical Medicine & Rehabilitation

## 2017-08-30 ENCOUNTER — Encounter: Payer: Medicare Other | Attending: Physical Medicine & Rehabilitation | Admitting: Physical Medicine & Rehabilitation

## 2017-08-30 ENCOUNTER — Other Ambulatory Visit: Payer: Self-pay

## 2017-08-30 ENCOUNTER — Other Ambulatory Visit: Payer: Self-pay | Admitting: *Deleted

## 2017-08-30 VITALS — BP 132/83 | HR 74

## 2017-08-30 DIAGNOSIS — Z9842 Cataract extraction status, left eye: Secondary | ICD-10-CM | POA: Diagnosis not present

## 2017-08-30 DIAGNOSIS — Z79899 Other long term (current) drug therapy: Secondary | ICD-10-CM | POA: Diagnosis not present

## 2017-08-30 DIAGNOSIS — K588 Other irritable bowel syndrome: Secondary | ICD-10-CM

## 2017-08-30 DIAGNOSIS — K219 Gastro-esophageal reflux disease without esophagitis: Secondary | ICD-10-CM | POA: Diagnosis not present

## 2017-08-30 DIAGNOSIS — I69322 Dysarthria following cerebral infarction: Secondary | ICD-10-CM | POA: Diagnosis not present

## 2017-08-30 DIAGNOSIS — K589 Irritable bowel syndrome without diarrhea: Secondary | ICD-10-CM | POA: Diagnosis not present

## 2017-08-30 DIAGNOSIS — Z96651 Presence of right artificial knee joint: Secondary | ICD-10-CM | POA: Diagnosis not present

## 2017-08-30 DIAGNOSIS — E785 Hyperlipidemia, unspecified: Secondary | ICD-10-CM | POA: Insufficient documentation

## 2017-08-30 DIAGNOSIS — R1310 Dysphagia, unspecified: Secondary | ICD-10-CM | POA: Diagnosis not present

## 2017-08-30 DIAGNOSIS — E1142 Type 2 diabetes mellitus with diabetic polyneuropathy: Secondary | ICD-10-CM | POA: Insufficient documentation

## 2017-08-30 DIAGNOSIS — Z8249 Family history of ischemic heart disease and other diseases of the circulatory system: Secondary | ICD-10-CM | POA: Insufficient documentation

## 2017-08-30 DIAGNOSIS — Z9889 Other specified postprocedural states: Secondary | ICD-10-CM | POA: Diagnosis not present

## 2017-08-30 DIAGNOSIS — M1990 Unspecified osteoarthritis, unspecified site: Secondary | ICD-10-CM | POA: Diagnosis not present

## 2017-08-30 DIAGNOSIS — Z9841 Cataract extraction status, right eye: Secondary | ICD-10-CM | POA: Insufficient documentation

## 2017-08-30 DIAGNOSIS — Z823 Family history of stroke: Secondary | ICD-10-CM | POA: Insufficient documentation

## 2017-08-30 DIAGNOSIS — I639 Cerebral infarction, unspecified: Secondary | ICD-10-CM | POA: Diagnosis not present

## 2017-08-30 DIAGNOSIS — I10 Essential (primary) hypertension: Secondary | ICD-10-CM | POA: Diagnosis not present

## 2017-08-30 DIAGNOSIS — I63312 Cerebral infarction due to thrombosis of left middle cerebral artery: Secondary | ICD-10-CM

## 2017-08-30 DIAGNOSIS — I69351 Hemiplegia and hemiparesis following cerebral infarction affecting right dominant side: Secondary | ICD-10-CM | POA: Insufficient documentation

## 2017-08-30 DIAGNOSIS — E1151 Type 2 diabetes mellitus with diabetic peripheral angiopathy without gangrene: Secondary | ICD-10-CM | POA: Diagnosis not present

## 2017-08-30 DIAGNOSIS — R531 Weakness: Secondary | ICD-10-CM

## 2017-08-30 MED ORDER — HYOSCYAMINE SULFATE 0.125 MG PO TABS: 0.1250 mg | ORAL_TABLET | Freq: Four times a day (QID) | ORAL | 2 refills | Status: AC | PRN

## 2017-08-30 MED ORDER — HYOSCYAMINE SULFATE 0.125 MG PO TABS: 0.1250 mg | ORAL_TABLET | Freq: Four times a day (QID) | ORAL | 2 refills | Status: DC | PRN

## 2017-08-30 NOTE — Patient Instructions (Addendum)
You can continue with her home therapies as long as the therapist sees fit however receiving outpatient therapy for your right arm would give you the best results in the long-term.  Just let me know if that something you would like to pursue and we can help get you set up for an appointment   PLEASE FEEL FREE TO CALL OUR OFFICE WITH ANY PROBLEMS OR QUESTIONS [phone])  HAVE A HAPPY HOLIDAYS!                     ^                  ^^                ^ ^ ^             ^ ^ ^ ^ ^           ^ ^ ^ ^ ^ ^ ^        ^ ^ [place]^ ^ [place]^ ^ [place]^ ^ [place]^      ^ [place]^ ^ [place]^ ^ ^ [place]^ ^ [place]^ ^ Marland Kitchen^                Marland Kitchen^^^^                Marland Kitchen^^^^                Marland Kitchen^^^^

## 2017-08-30 NOTE — Progress Notes (Signed)
Subjective:    Patient ID: Kristi David, female    DOB: 10/14/1940, 76 y.o.   MRN: [id number]  HPI    Kristi David is here in follow up of her left basal ganglia infarct. [facility]. PT has signed off. she is walking with a straight cane. She denies any falls. She is participating in a HEP for her legs.   She tells me that she is been advanced to a regular diet which she is tolerating well.  She has had some occasional GI cramps related to her IBS and is out of her hyoscyamine.  She has me if I could refill this today.  Her sleep has been inconsistent.  She states that she struggles falling to sleep at times.  She does not want to take a sleep aid.  She admits to having the television on when she goes to bed and often will go to bed until midnight.  She wakes up typically at 5:00 in the morning.  She denies taking naps during the day.  Her mood has been positive.  Family remains supportive    Pain Inventory Average Pain 0 Pain Right Now 0 My pain is no pain  In the last 24 hours, has pain interfered with the following? General activity 0 Relation with others 0 Enjoyment of life 0 What TIME of day is your pain at its worst? evening Sleep (in general) Poor  Pain is worse with: walking and bending Pain improves with: rest and medication Relief from Meds: 0  Mobility use a cane how many minutes can you walk? not sure ability to climb steps?  no do you drive?  no Do you have any goals in this area?  yes  Function retired I need assistance with the following:  dressing, bathing, meal prep, household duties and shopping Do you have any goals in this area?  yes  Neuro/Psych weakness numbness trouble walking  Prior Studies Any changes since last visit?  no  Physicians involved in your care Any changes since last visit?  no   Family History  Problem Relation Age of Onset  . Stroke Mother   . CAD Father    Social History   Socioeconomic  History  . Marital status: Divorced    Spouse name: None  . Number of children: None  . Years of education: None  . Highest education level: None  Social Needs  . Financial resource strain: None  . Food insecurity - worry: None  . Food insecurity - inability: None  . Transportation needs - medical: None  . Transportation needs - non-medical: None  Occupational History  . None  Tobacco Use  . Smoking status: Never Smoker  . Smokeless tobacco: Never Used  Substance and Sexual Activity  . Alcohol use: No  . Drug use: No  . Sexual activity: None  Other Topics Concern  . None  Social History Narrative  . None   Past Surgical History:  Procedure Laterality Date  . BUNIONECTOMY Bilateral   . CATARACT EXTRACTION W/ INTRAOCULAR LENS  IMPLANT, BILATERAL Bilateral   . INGUINAL HERNIA REPAIR Right   . KNEE ARTHROSCOPY WITH EXCISION BAKER'S CYST Right   . MR LOWER LEG LEFT ([facility] HX) Left    after a break  . PARTIAL KNEE ARTHROPLASTY Right 01/06/2016   Procedure: RIGHT UNICOMPARTMENTAL KNEE ARTHROPLASTY;  Surgeon: Kathryne Hitchhristopher Y Blackman, MD;  [facility];  Service: Orthopedics;  Laterality: Right;  . VEIN LIGATION  AND STRIPPING Bilateral    Past Medical History:  Diagnosis Date  . Acute blood loss anemia   . Anemia   . Arthritis    "legs, knees" (01/07/2016)  . Arthritis of right hand 01/11/2017  . Diabetes mellitus (HCC)   . Diastolic dysfunction   . Dysarthria, post-stroke   . Dysphagia, post-stroke   . Facial droop   . GERD (gastroesophageal reflux disease)    no meds  . High cholesterol   . History of blood transfusion   . Hyperlipidemia   . Hypertension   . Hypokalemia   . Left basal ganglia embolic stroke (HCC) 07/18/2017  . Osteoarthritis of right knee 01/06/2016  . Seasonal allergies   . Shortness of breath dyspnea    if too active  . Stroke (HCC) 07/16/2017  . Syncope    passed out and was hospitalized for a couple of days  . Type II diabetes mellitus (HCC)     does not take medicine diet controlled   BP 132/83   Pulse 74   SpO2 95%   Opioid Risk Score:  0 Fall Risk Score:  `1  Depression screen PHQ 2/9  Depression screen [facility] 2/9 08/30/2017 08/30/2017  Decreased Interest 0 1  Down, Depressed, Hopeless 0 1  PHQ - 2 Score 0 2  Altered sleeping 1 -  Tired, decreased energy 1 -  Change in appetite 0 -  Feeling bad or failure about yourself  0 -  Trouble concentrating 0 -  Moving slowly or fidgety/restless 1 -  Suicidal thoughts 0 -  PHQ-9 Score 3 -  Difficult doing work/chores Not difficult at all -      Review of Systems  Constitutional: Negative.   HENT: Negative.   Eyes: Negative.   Respiratory: Negative.   Cardiovascular: Negative.   Gastrointestinal: Negative.   Endocrine: Negative.   Genitourinary: Negative.   Musculoskeletal: Negative.   Skin: Negative.   Allergic/Immunologic: Negative.   Neurological: Negative.   Hematological: Negative.   Psychiatric/Behavioral: Negative.        Objective:   Physical Exam  Head: Normocephalic and atraumatic.  Eyes: EOMI.  Neck: Normal range of motion. Neck supple. No thyromegaly present.  Cardiovascular:  Regular rate, no JVD Respiratory: Clear with normal effort GI: BS +, non-tender, non-distended .  Musculoskeletal: She exhibits no edema or tenderness.  Neurological: She is alert.   Speech dysarthric, voice soft. right central 7 and tongue deviation remains Motor: LUE/LLE: 5/5 proximal to distal RUE: Shoulder abduction   2 to 2+/5 proximal to distal, elbow flex/ext 1-2/5, grip 1-2 /5 ---stable in apppearance RLE: 4-/5 HF, KE and 4/5 ADF/PF.    Gait is slightly wide-based without the cane.  She also tends to slide the right leg in land on the forefoot instead of the heel.  When she walks with her cane gait appeared much more narrow based and more fluid in general. Sensation intact to light touch on right arm and leg.    Skin: Skin is warm and dry.  Psychiatric: Affect  remains flat but generally she is pleasant and cooperative        Assessment & Plan:  Medical Problem List and Plan: 1. Right sided weakness and dysarthria secondary to left basal ganglia infarction secondary to small vessel disease             -continue with HH therapies.  Ideally I would advance her to outpatient occupational therapy and possibly physical therapy when her home health program  is completed.  She is a bit hesitant to pursue as she has appreciated the therapist helping her with dressing 3 mornings per week when she comes.  We discussed gait mechanics and use of her cane.  I would prefer her using her cane more often than not.   2.  Pain Management: Voltaren gel as needed, horizant 3. IBS: hyoscyamine refilled 4. Mood: Celexa 40 mg daily 5. Neuropsych: This patient is capable of making decisions on her own behalf. 6. Dysphagia. Now on regular diet 7. Hypertension. Cozaar 100 mg daily, Toprol 25 mg daily             -Blood pressure under control at present  8. Hyperlipidemia. Lipitor 9. Diabetes mellitus with peripheral neuropathy.  Per primary  Fifteen minutes of face to face patient care time were spent during this visit. All questions were encouraged and answered.  Follow up in 3 months.

## 2017-08-31 DIAGNOSIS — I69392 Facial weakness following cerebral infarction: Secondary | ICD-10-CM | POA: Diagnosis not present

## 2017-08-31 DIAGNOSIS — I69318 Other symptoms and signs involving cognitive functions following cerebral infarction: Secondary | ICD-10-CM | POA: Diagnosis not present

## 2017-08-31 DIAGNOSIS — I69391 Dysphagia following cerebral infarction: Secondary | ICD-10-CM | POA: Diagnosis not present

## 2017-08-31 DIAGNOSIS — R1312 Dysphagia, oropharyngeal phase: Secondary | ICD-10-CM | POA: Diagnosis not present

## 2017-08-31 DIAGNOSIS — I69322 Dysarthria following cerebral infarction: Secondary | ICD-10-CM | POA: Diagnosis not present

## 2017-08-31 DIAGNOSIS — I69351 Hemiplegia and hemiparesis following cerebral infarction affecting right dominant side: Secondary | ICD-10-CM | POA: Diagnosis not present

## 2017-09-02 DIAGNOSIS — I69318 Other symptoms and signs involving cognitive functions following cerebral infarction: Secondary | ICD-10-CM | POA: Diagnosis not present

## 2017-09-02 DIAGNOSIS — R1312 Dysphagia, oropharyngeal phase: Secondary | ICD-10-CM | POA: Diagnosis not present

## 2017-09-02 DIAGNOSIS — I69351 Hemiplegia and hemiparesis following cerebral infarction affecting right dominant side: Secondary | ICD-10-CM | POA: Diagnosis not present

## 2017-09-02 DIAGNOSIS — I69322 Dysarthria following cerebral infarction: Secondary | ICD-10-CM | POA: Diagnosis not present

## 2017-09-02 DIAGNOSIS — I69391 Dysphagia following cerebral infarction: Secondary | ICD-10-CM | POA: Diagnosis not present

## 2017-09-02 DIAGNOSIS — I69392 Facial weakness following cerebral infarction: Secondary | ICD-10-CM | POA: Diagnosis not present

## 2017-09-12 ENCOUNTER — Telehealth: Payer: Self-pay | Admitting: Physical Medicine & Rehabilitation

## 2017-09-12 DIAGNOSIS — I639 Cerebral infarction, unspecified: Secondary | ICD-10-CM

## 2017-09-12 NOTE — Telephone Encounter (Signed)
Pt phoned and stated she has finished her home healthcare. She would like a referral to outpatient therapy. Please advise.

## 2017-09-13 NOTE — Telephone Encounter (Signed)
Orders for outpatient OT and PT [name]

## 2017-11-01 ENCOUNTER — Other Ambulatory Visit: Payer: Self-pay

## 2017-11-01 ENCOUNTER — Encounter: Payer: Self-pay | Admitting: Physical Therapy

## 2017-11-01 ENCOUNTER — Encounter: Payer: Self-pay | Admitting: Occupational Therapy

## 2017-11-01 ENCOUNTER — Ambulatory Visit: Payer: Medicare Other | Admitting: Occupational Therapy

## 2017-11-01 ENCOUNTER — Ambulatory Visit: Payer: Medicare Other | Attending: Physical Medicine & Rehabilitation | Admitting: Physical Therapy

## 2017-11-01 DIAGNOSIS — R293 Abnormal posture: Secondary | ICD-10-CM | POA: Diagnosis not present

## 2017-11-01 DIAGNOSIS — M25641 Stiffness of right hand, not elsewhere classified: Secondary | ICD-10-CM | POA: Diagnosis not present

## 2017-11-01 DIAGNOSIS — M25511 Pain in right shoulder: Secondary | ICD-10-CM | POA: Insufficient documentation

## 2017-11-01 DIAGNOSIS — M25611 Stiffness of right shoulder, not elsewhere classified: Secondary | ICD-10-CM | POA: Diagnosis not present

## 2017-11-01 DIAGNOSIS — I69951 Hemiplegia and hemiparesis following unspecified cerebrovascular disease affecting right dominant side: Secondary | ICD-10-CM

## 2017-11-01 DIAGNOSIS — M79641 Pain in right hand: Secondary | ICD-10-CM | POA: Insufficient documentation

## 2017-11-01 DIAGNOSIS — R2689 Other abnormalities of gait and mobility: Secondary | ICD-10-CM | POA: Diagnosis not present

## 2017-11-01 DIAGNOSIS — I69351 Hemiplegia and hemiparesis following cerebral infarction affecting right dominant side: Secondary | ICD-10-CM | POA: Insufficient documentation

## 2017-11-01 DIAGNOSIS — M6281 Muscle weakness (generalized): Secondary | ICD-10-CM

## 2017-11-01 DIAGNOSIS — R2681 Unsteadiness on feet: Secondary | ICD-10-CM | POA: Insufficient documentation

## 2017-11-01 DIAGNOSIS — I69318 Other symptoms and signs involving cognitive functions following cerebral infarction: Secondary | ICD-10-CM

## 2017-11-01 NOTE — Therapy (Signed)
[facility] [address] [place], [place], [address] Phone: [phone]   Fax:  [phone]  Physical Therapy Evaluation  Patient Details  Name: Kristi David MRN: [id number] Date of Birth: 06/06/1941 Referring Provider: Dr. Riley KillSwartz   Encounter Date: 11/01/2017  PT End of Session - 11/01/17 1435    Visit Number  1    Number of Visits  18    Date for PT Re-Evaluation  12/30/17    Authorization Type  Medicare     Authorization Time Period  11/01/2017----12/30/2017    PT Start Time  1305    PT Stop Time  1355    PT Time Calculation (min)  50 min    Equipment Utilized During Treatment  Gait belt    Activity Tolerance  Patient tolerated treatment well    Behavior During Therapy  [facility] for tasks assessed/performed       Past Medical History:  Diagnosis Date  . Acute blood loss anemia   . Anemia   . Arthritis    "legs, knees" (01/07/2016)  . Arthritis of right hand 01/11/2017  . Diabetes mellitus (HCC)   . Diastolic dysfunction   . Dysarthria, post-stroke   . Dysphagia, post-stroke   . Facial droop   . GERD (gastroesophageal reflux disease)    no meds  . High cholesterol   . History of blood transfusion   . Hyperlipidemia   . Hypertension   . Hypokalemia   . Left basal ganglia embolic stroke (HCC) 07/18/2017  . Osteoarthritis of right knee 01/06/2016  . Seasonal allergies   . Shortness of breath dyspnea    if too active  . Stroke (HCC) 07/16/2017  . Syncope    passed out and was hospitalized for a couple of days  . Type II diabetes mellitus (HCC)    does not take medicine diet controlled    Past Surgical History:  Procedure Laterality Date  . BUNIONECTOMY Bilateral   . CATARACT EXTRACTION W/ INTRAOCULAR LENS  IMPLANT, BILATERAL Bilateral   . INGUINAL HERNIA REPAIR Right   . KNEE ARTHROSCOPY WITH EXCISION BAKER'S CYST Right   . MR LOWER LEG LEFT ([facility] HX) Left    after a break  . PARTIAL KNEE ARTHROPLASTY  Right 01/06/2016   Procedure: RIGHT UNICOMPARTMENTAL KNEE ARTHROPLASTY;  Surgeon: Kathryne Hitchhristopher Y Blackman, MD;  [facility];  Service: Orthopedics;  Laterality: Right;  . VEIN LIGATION AND STRIPPING Bilateral     There were no vitals filed for this visit.   Subjective Assessment - 11/01/17 1322    Subjective  This 77yo female was referred to PT & OT with Left Basal Ganglia embolic CVA by Faith RogueZachary Swartz, MD. On 07/16/2018 onset of right side weakness & slurred speech. Inpatient Rehab admission 07/18/18 to 07/30/18. Patient had Home Health PT until early December & OT Drenda Freeze/speech until late December. Patient and son requested additional services to maximize her mobility & return closer to prior level of function.     Patient is accompained by:  Family member    Limitations  Walking;House hold activities    Patient Stated Goals  pt wants to use R hand more efficently and walk without use of AD in community     Currently in Pain?  Yes    Pain Score  0-No pain    Pain Location  Shoulder 8/10 wost, lowest pain is 0/10   over last week     Pain Orientation  Right    Pain Descriptors /  Indicators  Sore    Pain Type  Chronic pain pain started with stroke     Pain Onset  More than a month ago    Pain Frequency  Intermittent    Aggravating Factors   using R arm     Pain Relieving Factors  rest and medecine         [facility] PT Assessment - 11/01/17 1315      Assessment   Medical Diagnosis  CVA basal gangia     Referring Provider  Faith Rogue, MD    Onset Date/Surgical Date  07/16/17    Hand Dominance  Right    Prior Therapy  inpatient rehab, home health       Precautions   Precautions  Fall      Restrictions   Weight Bearing Restrictions  No      Balance Screen   Has the patient fallen in the past 6 months  No    Has the patient had a decrease in activity level because of a fear of falling?   No    Is the patient reluctant to leave their home because of a fear of falling?   No       Home Nurse, mental health  Private residence [place]     Living Arrangements  Alone    Available Help at Discharge  Family son and daughter    Type of Home  Apartment    Home Conservator, museum/gallery 2nd floor(140ft door to Financial planner)     Home Layout  One level    Home Equipment  Hart - single point;Walker - 4 wheels;Shower seat;Grab bars - tub/shower;Bedside commode;Hand held shower head      Prior Function   Level of Independence  Independent with community mobility without device;Independent with household mobility without device;Independent with gait      Observation/Other Assessments   Observations  R face drop     Focus on Therapeutic Outcomes (FOTO)   36.17 Functional Status 35 Risk adjusted      Posture/Postural Control   Posture/Postural Control  Postural limitations    Postural Limitations  Rounded Shoulders;Forward head;Weight shift left      ROM / Strength   AROM / PROM / Strength  Strength      Strength   Overall Strength  Deficits    Overall Strength Comments  R UE/LE weakness     Strength Assessment Site  Knee;Hip;Ankle    Right/Left Hip  Right;Left    Right Hip Flexion  4/5    Right Hip Extension  3-/5 tested standing with BUE support    Right Hip ABduction  3-/5 tested standing with BUE support    Left Hip Flexion  4+/5    Left Hip Extension  4/5 tested standing with BUE support    Left Hip ABduction  4-/5 tested standing with BUE support    Right/Left Knee  Right;Left    Right Knee Flexion  3-/5 tested standing with BUE support    Left Knee Flexion  4/5 tested standing with BUE support    Right/Left Ankle  Right;Left    Right Ankle Dorsiflexion  4+/5    Left Ankle Dorsiflexion  4+/5      Transfers   Transfers  Sit to Stand;Stand to Sit    Sit to Stand  5: Supervision;From chair/3-in-1;With upper extremity assist;With armrests    Stand to Sit  5: Supervision;With upper extremity assist;To chair/3-in-1;With armrests  Ambulation/Gait    Ambulation/Gait  Yes    Ambulation/Gait Assistance  5: Supervision;4: Min guard Min guard with dual task or scanning    Ambulation/Gait Assistance Details  PT assessed gait with cane & without device with patient goal.     Ambulation Distance (Feet)  200 Feet with breaks for different theraputic activities 90' longest    Assistive device  Straight cane;None arrived using cane. Assessment with cane & without device    Gait Pattern  Step-through pattern;Decreased arm swing - right;Decreased dorsiflexion - right;Right hip hike;Lateral trunk lean to left;Poor foot clearance - right;Decreased stance time - right;Decreased stride length;Decreased hip/knee flexion - right;Decreased weight shift to right;Right genu recurvatum;Abducted- right    Ambulation Surface  Level;Indoor    Gait velocity  1.43ft/s with SPC, 2.5ft/s without device 24M SPC 18.20sec, no AD 16.28sec    Stairs  Yes    Stairs Assistance  4: Min guard    Stair Management Technique  One rail Right;Alternating pattern;Forwards initiated descent with step-to pattern then alternating    Number of Stairs  4    Height of Stairs  6    Ramp  5: Supervision no device    Curb  4: Min assist Min. guard, no device    Curb Details (indicate cue type and reason)  unsteady weight shift both directions       Standardized Balance Assessment   Standardized Balance Assessment  Timed Up and Go Test      Timed Up and Go Test   TUG  Normal TUG;Cognitive TUG    Normal TUG (seconds)  18.53 high falls risk     Cognitive TUG (seconds)  36.91 high falls risk     TUG Comments  50% difference in time required to complete Normal Tug and Cognitive tug( name food items). Pt displayed stoppage in naming items and stopping of gait task during cognitive dual task.  Indicating significant falls risk        Functional Gait  Assessment   Gait assessed   Yes    Gait Level Surface  Walks 20 ft, slow speed, abnormal gait pattern, evidence for imbalance or deviates 10-15  in outside of the 12 in walkway width. Requires more than 7 sec to ambulate 20 ft.    Change in Gait Speed  Able to change speed, demonstrates mild gait deviations, deviates 6-10 in outside of the 12 in walkway width, or no gait deviations, unable to achieve a major change in velocity, or uses a change in velocity, or uses an assistive device.    Gait with Horizontal Head Turns  Performs head turns smoothly with slight change in gait velocity (eg, minor disruption to smooth gait path), deviates 6-10 in outside 12 in walkway width, or uses an assistive device.    Gait with Vertical Head Turns  Performs task with slight change in gait velocity (eg, minor disruption to smooth gait path), deviates 6 - 10 in outside 12 in walkway width or uses assistive device    Gait and Pivot Turn  Pivot turns safely within 3 sec and stops quickly with no loss of balance.    Step Over Obstacle  Is able to step over one shoe box (4.5 in total height) without changing gait speed. No evidence of imbalance.    Gait with Narrow Base of Support  Ambulates less than 4 steps heel to toe or cannot perform without assistance.    Gait with Eyes Closed  Walks 20 ft, slow speed,  abnormal gait pattern, evidence for imbalance, deviates 10-15 in outside 12 in walkway width. Requires more than 9 sec to ambulate 20 ft.    Ambulating Backwards  Walks 20 ft, slow speed, abnormal gait pattern, evidence for imbalance, deviates 10-15 in outside 12 in walkway width.    Steps  Alternating feet, must use rail.    Total Score  16             Objective measurements completed on examination: See above findings.              PT Education - 11/01/17 1434    Education provided  Yes    Education Details  Falls risk  and impact of Cognition on her gait, PT POC,     Person(s) Educated  Patient;Child(ren)    Methods  Explanation    Comprehension  Verbalized understanding;Need further instruction;Verbal cues required       PT  Short Term Goals - 11/01/17 1459      PT SHORT TERM GOAL #1   Title  Pt will deomonstate independence with initial HEP    Time  4    Period  Weeks    Status  New    Target Date  11/29/17      PT SHORT TERM GOAL #2   Title  pt will demonstrate tandem walking 15steps min guard     Time  4    Period  Weeks    Status  New    Target Date  11/29/17      PT SHORT TERM GOAL #3   Title  Pt will demonstrate stepping over objects 6-8" tall without AD with supervision  in order to reduce falls risk     Time  4    Period  Weeks    Status  New    Target Date  11/29/17      PT SHORT TERM GOAL #4   Title  pt will ambulate 600' without AD performing naming task with continual gait with supervision.     Baseline  --    Time  4    Period  Weeks    Status  New    Target Date  11/29/17        PT Long Term Goals - 11/01/17 1501      PT LONG TERM GOAL #1   Title  pt will demonstrate independence in ongoing HEP/ Fitness program in order to continue to maintain improvement made in PT    Time  2    Period  Months    Status  New    Target Date  12/31/17      PT LONG TERM GOAL #2   Title  Pt will demonstate a standard Timed Up-Go score of </=14.0" and a cognitive TUG score of</=15.0"    in order to improve functional gait     Baseline  standard-18.20" Cognitive 36.91"    Time  2    Period  Months    Status  New    Target Date  12/31/17      PT LONG TERM GOAL #3   Title  Pt will demonstate an Functional Gait Assessment score  >/= 24/30 in order to furhter improve funtional gait     Time  2    Period  Months    Status  New    Target Date  12/31/17      PT LONG TERM GOAL #4   Title  pt dill demonstrate  gait speed of >/=2.62 ft/sec safely in order to indicate a safe community level ambulator    Baseline  2.68ft/s    Time  2    Period  Months    Status  New    Target Date  12/31/17      PT LONG TERM GOAL #5   Title  pt will negotiate ramps,curbs,grass, and short paved distance  without device modified independent to indicate ability for community access.     Baseline  min assist on curbs, supervision on ramps     Time  2    Period  Months    Status  New    Target Date  12/31/17      PT LONG TERM GOAL #6   Title  Pt will ambulate >/= 1000' while maintaining light conversation with fluent gait without device modified independent to enbable community mobility.     Time  2    Period  Months    Status  New    Target Date  12/31/17             Plan - 11/01/17 1452    Clinical Impression Statement  Pt is a 77yo female was referred to PT & OT with Left Basal Ganglia embolic CVA On 07/16/2018 onset of right side weakness & slurred speech. Pt demonstrated a significant (50%) difference in time between cognitive and normal Timed Up-Go and 2.01 ft/s gait speed indicating high falls risk.  In addition, Functional Gait Assessment score of 16/30 indicates moderate falls risk. Pt also demonstrates significant R sided muscle weakness in both UE and LE affecting her mobility. These clinical findings and PMH highlight that patient has fall risk. Patient appears would benefit from skilled PT intervention to address issues of gait and balance to improve her mobility closer to her prior level of function which is her goal.     History and Personal Factors relevant to plan of care:  Basal Ganglia CVA, HTN, DM,2, peripheral neuropathy    Clinical Presentation  Evolving    Clinical Presentation due to:  high fall risk, lives alone,     Clinical Decision Making  Moderate    Rehab Potential  Good    PT Frequency  2x / week    PT Duration  Other (comment) 9 weeks (60 days)    PT Treatment/Interventions  ADLs/Self Care Home Management;Aquatic Therapy;Biofeedback;Cryotherapy;Electrical Stimulation;Moist Heat;Traction;Ultrasound;DME Instruction;Stair training;Functional mobility training;Therapeutic activities;Therapeutic exercise;Balance training;Neuromuscular re-education;Cognitive  remediation;Patient/family education;Orthotic Fit/Training;Manual techniques;Passive range of motion;Taping    PT Next Visit Plan  Establish a HEP for pt to progress at home to address Proximal RLE strength and balance.    Recommended Other Services  Speech Therapy maybe later as patient did not want it currently    Consulted and Agree with Plan of Care  Patient;Family member/caregiver    Family Member Consulted  Son        Patient will benefit from skilled therapeutic intervention in order to improve the following deficits and impairments:  Abnormal gait, Decreased balance, Decreased mobility, Difficulty walking, Decreased cognition, Decreased activity tolerance, Decreased safety awareness, Decreased strength, Impaired UE functional use, Decreased endurance, Postural dysfunction  Visit Diagnosis: Muscle weakness (generalized)  Other abnormalities of gait and mobility  Unsteadiness on feet  Hemiplegia and hemiparesis following cerebral infarction affecting right dominant side [facility])     Problem List Patient Active Problem List   Diagnosis Date Noted  . Left basal ganglia embolic stroke (HCC) 07/18/2017  . Facial droop   .  Right sided weakness   . Diabetes mellitus (HCC)   . Diastolic dysfunction   . Dysphagia, post-stroke   . Dysarthria, post-stroke   . Hypokalemia   . Acute blood loss anemia   . Hyperlipidemia   . Stroke (HCC) 07/16/2017  . Hypertension 07/16/2017  . Arthritis of right hand 01/11/2017  . Osteoarthritis of right knee 01/06/2016  . Status post right partial knee replacement 01/06/2016   Merton Border, SPT 11/01/2017, 4:00 PM  Vladimir Faster PT, DPT 11/01/2017, 5:40 PM  [facility] [address] [place], [place], [address] Phone: [phone]   Fax:  [phone]  Name: Kristi David MRN: [id number] Date of Birth: [birth year]

## 2017-11-01 NOTE — Therapy (Signed)
[facility] [address] [place], [place], [address] Phone: [phone]   Fax:  [phone]  Occupational Therapy Evaluation  Patient Details  Name: Kristi David MRN: [id number] Date of Birth: 05-23-1941 Referring Provider: Dr. Riley Kill   Encounter Date: 11/01/2017  OT End of Session - 11/01/17 1721    Visit Number  1    Number of Visits  16    Date for OT Re-Evaluation  12/27/17    Authorization Type  medicare    Authorization Time Period  60 days    OT Start Time  1402    OT Stop Time  1443    OT Time Calculation (min)  41 min    Activity Tolerance  Patient tolerated treatment well       Past Medical History:  Diagnosis Date  . Acute blood loss anemia   . Anemia   . Arthritis    "legs, knees" (01/07/2016)  . Arthritis of right hand 01/11/2017  . Diabetes mellitus (HCC)   . Diastolic dysfunction   . Dysarthria, post-stroke   . Dysphagia, post-stroke   . Facial droop   . GERD (gastroesophageal reflux disease)    no meds  . High cholesterol   . History of blood transfusion   . Hyperlipidemia   . Hypertension   . Hypokalemia   . Left basal ganglia embolic stroke (HCC) 07/18/2017  . Osteoarthritis of right knee 01/06/2016  . Seasonal allergies   . Shortness of breath dyspnea    if too active  . Stroke (HCC) 07/16/2017  . Syncope    passed out and was hospitalized for a couple of days  . Type II diabetes mellitus (HCC)    does not take medicine diet controlled    Past Surgical History:  Procedure Laterality Date  . BUNIONECTOMY Bilateral   . CATARACT EXTRACTION W/ INTRAOCULAR LENS  IMPLANT, BILATERAL Bilateral   . INGUINAL HERNIA REPAIR Right   . KNEE ARTHROSCOPY WITH EXCISION BAKER'S CYST Right   . MR LOWER LEG LEFT ([facility] HX) Left    after a break  . PARTIAL KNEE ARTHROPLASTY Right 01/06/2016   Procedure: RIGHT UNICOMPARTMENTAL KNEE ARTHROPLASTY;  Surgeon: Kathryne Hitch, MD;  Location: [facility]  OR;  Service: Orthopedics;  Laterality: Right;  . VEIN LIGATION AND STRIPPING Bilateral     There were no vitals filed for this visit.  Subjective Assessment - 11/01/17 1406    Currently in Pain?  Yes    Pain Score  5     Pain Location  Shoulder    Pain Orientation  Right    Pain Descriptors / Indicators  Aching;Sore;Tightness    Pain Type  Chronic pain since stroke    Pain Onset  More than a month ago    Pain Frequency  Intermittent    Aggravating Factors   using the R arm or moving it a certain way    Pain Relieving Factors  avoiding movements, rub it with ointment, have not tried heat or ice         [facility] OT Assessment - 11/01/17 1409      Assessment   Medical Diagnosis  L BG CVA    Referring Provider  Dr. Riley Kill    Onset Date/Surgical Date  07/17/07    Hand Dominance  Right    Prior Therapy  inpt rehab, HHPT, OT and ST      Precautions   Precautions  Fall      Restrictions  Weight Bearing Restrictions  No      Balance Screen   Has the patient fallen in the past 6 months  No PT eval today      Home  Environment   Family/patient expects to be discharged to:  Private residence independent living    Living Arrangements  Alone staff is there full time, no aides needed at this time    Available Help at Discharge  Available PRN/intermittently 3 adult children in the area that stop frequently    Type of Home  Aartment    Bathroom Shower/Tub  Walk-in Shower    Additional Comments  shower seat, 3 in 1 commode over the toilet, grab bars in the shower, grab bar near toilet.      Prior Function   Level of Independence  Independent    Vocation  Retired    Leisure  go out shopping, go to the movies      ADL   Eating/Feeding  Modified independent uses L hand    Grooming  Modified independent    Upper Body Bathing  Minimal assistance hard to wash R arm and back    Lower Body Bathing  Modified independent    Upper Body Dressing  Independent takes extra time and effort     Lower Body Dressing  Modified independent wears slip on shoes,     Toilet Transfer  Modified independent    Toileting - Clothing Manipulation  Modified independent    Tub/Shower Transfer  Modified independent    ADL comments  Pt uses either her walker or cane to ambulate inside.       IADL   Shopping  Assistance for transportation;Needs to be accompanied on any shopping trip    Light Housekeeping  Performs light daily tasks but cannot maintain acceptable level of cleanliness family assists with more involved cleaning    Meal Prep  Plans, prepares and serves adequate meals independently    Community Mobility  Relies on family or friends for transportation    Medication Management  Is responsible for taking medication in correct dosages at correct time    Financial Management  Requires assistance family assists with writing a check      Mobility   Mobility Status  Independent    Mobility Status Comments  Pt reports she walks sometimes outside by herself.  PT reports that pt at fall risk with divided attention with functional ambulation.      Written Expression   Dominant Hand  Right    Handwriting  Not legible for name with R hand      Vision - History   Baseline Vision  Wears glasses only for reading    Additional Comments  Pt reports glare occassionally in her left eye since stroke, Reports that words can run together when she is reading.       Vision Assessment   Eye Alignment  Impaired (comment) mild     Ocular Range of Motion  Within Functional Limits    Tracking/Visual Pursuits  Other (comment) difficulty tracking into R fields      Activity Tolerance   Activity Tolerance  Tolerate 30+ min activity without fatigue      Cognition   Attention  Alternating;Divided;Selective    Memory  Impaired      Posture/Postural Control   Posture/Postural Control  Postural limitations    Posture Comments  scapula positioned in abduction, elevation and upward rotation.  Pt with decreased  activity on R side of  trunk and with passive lengthening. Pt with decreased A/P and lateral weight shifts.       Sensation   Light Touch  Impaired Detail dulled on R side    Hot/Cold  Impaired by gross assessment R hand hypersensitive to temp    Proprioception  Appears Intact      Coordination   Gross Motor Movements are Fluid and Coordinated  No    Fine Motor Movements are Fluid and Coordinated  No    Other  Pt unable to do Box and Blocks or 9 hole peg test      Perception   Perception  -- to be further assessed via functional activity      Praxis   Praxis  -- to be further assessed via functional activity      Edema   Edema  mild edema today;       Tone   Assessment Location  Right Upper Extremity      ROM / Strength   AROM / PROM / Strength  AROM;PROM;Strength      AROM   Overall AROM   Deficits    Overall AROM Comments  LUE WFL's, RUE:  shoulder flexion to 30*, abduction to 40*, supination quarter range, minimal wrist flexion/extension, partial finger flexion. Pt appears to have possibly had to RSD at some point however appears to be beyond acute phase.        PROM   Overall PROM   Deficits    Overall PROM Comments  shoulder flexion to 95*, abduction to 45* - limited by tightness and pain under scapula and at top of shoulder.  ER WFL's supination to quarter range, wrist flexion/extension 5*, partial grip       Strength   Overall Strength  Deficits;Unable to assess unable to do MMT due to tone, pain and restricted PROM      Hand Function   Right Hand Gross Grasp  Impaired    Right Hand Grip (lbs)  unable to do dynamometer. Pt states she can hold very light things occassionally.      Left Hand Gross Grasp  Functional      RUE Tone   RUE Tone  Hypertonic;Modified Ashworth      RUE Tone   Modified Ashworth Scale for Grading Hypertonia RUE  More marked increase in muscle tone through most of the ROM, but affected part(s) easily moved                         OT Short Term Goals - 11/01/17 1632      OT SHORT TERM GOAL #1   Title  Pt will be mod I with HEP for RUE - 11/29/2017    Status  New      OT SHORT TERM GOAL #2   Title  Pt will rate pain no greater than 3/10 for PROM for shoulder flexion to 100* in supine to allow for adequate ROM for self care activities.     Status  New      OT SHORT TERM GOAL #3   Title  Pt will demonstrate ability to grasp cylindrical light weight object in R hand during light manipulation of item with L hand.     Status  New      OT SHORT TERM GOAL #4   Title  Pt will demonstrate ability for mod I for bathing at shower level    Status  New  OT Long Term Goals - 11/01/17 1650      OT LONG TERM GOAL #1   Title  Pt will be mod I with upgraded HEP prn - 12/27/2017    Status  New      OT LONG TERM GOAL #2   Title  Pt will rate pain in L shoulder no greater than 3/10 with functional use of LUE as stabilizer/occassional gross assist    Status  New      OT LONG TERM GOAL #3   Title  Pt will demonstrate ability to use RUE as stabilizer at least 50% of the time with basic self care activities.     Status  New      OT LONG TERM GOAL #4   Title  Pt will demonstrate shoulder flexion for RUE to at least 50* in preparation for low reach    Status  New            Plan - 11/01/17 1713    Clinical Impression Statement  Pt is a 77 year old female s/p L BG CVA on 07/16/2017.  Pt was discharged home to independent living apartment on 07/29/2017 after inpt rehab stay and the received HHPT, OT and ST.  Pt presents today with the following impairments that impact indepdendence:  R dominant spastic hemiplegia, impaired sensation, decreased P/AROM of RUE, pain in R shoulder, decreased balance, impaired cognition, decreased postural control, decreased functional use of RUE, decreased activity tolerance.   Pt will benefit from skilled OT to address these areas and maximize  independence and return to more active lifestyle.      Occupational Profile and client history currently impacting functional performance  PMH:  Depression, HTN, HLD, DM, peripheral neuropathy.      Occupational performance deficits (Please refer to evaluation for details):  ADL's;IADL's;Leisure;Social Participation    Rehab Potential  Fair    Current Impairments/barriers affecting progress:  severity of UE deficits    OT Frequency  2x / week    OT Duration  8 weeks    OT Treatment/Interventions  Self-care/ADL training;Cryotherapy;Moist Heat;Electrical Stimulation;DME and/or AE instruction;Neuromuscular education;Therapeutic exercise;Functional Mobility Training;Manual Therapy;Passive range of motion;Splinting;Therapeutic activities;Cognitive remediation/compensation;Patient/family education;Balance training    Plan  manual therapy, NMR for RUE/trunk, postural control and balance, functional mobility    Clinical Decision Making  Multiple treatment options, significant modification of task necessary    Consulted and Agree with Plan of Care  Patient       Patient will benefit from skilled therapeutic intervention in order to improve the following deficits and impairments:  Abnormal gait, Decreased activity tolerance, Decreased cognition, Decreased balance, Decreased knowledge of use of DME, Decreased range of motion, Decreased mobility, Decreased strength, Increased edema, Difficulty walking, Impaired UE functional use, Impaired tone, Impaired sensation, Pain  Visit Diagnosis: Spastic hemiplegia of right dominant side as late effect of cerebrovascular disease, unspecified cerebrovascular disease type (HCC) - Plan: Ot plan of care cert/re-cert  Stiffness of right shoulder, not elsewhere classified - Plan: Ot plan of care cert/re-cert  Stiffness of right hand, not elsewhere classified - Plan: Ot plan of care cert/re-cert  Pain of right shoulder joint on movement - Plan: Ot plan of care  cert/re-cert  Pain in right hand - Plan: Ot plan of care cert/re-cert  Abnormal posture - Plan: Ot plan of care cert/re-cert  Muscle weakness (generalized) - Plan: Ot plan of care cert/re-cert  Other symptoms and signs involving cognitive functions following cerebral infarction - Plan: Ot plan of  care cert/re-cert    Problem List Patient Active Problem List   Diagnosis Date Noted  . Left basal ganglia embolic stroke (HCC) 07/18/2017  . Facial droop   . Right sided weakness   . Diabetes mellitus (HCC)   . Diastolic dysfunction   . Dysphagia, post-stroke   . Dysarthria, post-stroke   . Hypokalemia   . Acute blood loss anemia   . Hyperlipidemia   . Stroke (HCC) 07/16/2017  . Hypertension 07/16/2017  . Arthritis of right hand 01/11/2017  . Osteoarthritis of right knee 01/06/2016  . Status post right partial knee replacement 01/06/2016    Norton Pastel, OTR/L 11/01/2017, 5:25 PM  [facility] [address] [place], [place], [address] Phone: [phone]   Fax:  [phone]  Name: Kristi David MRN: [id number] Date of Birth: [birth year]

## 2017-11-03 ENCOUNTER — Encounter: Payer: Self-pay | Admitting: Physical Therapy

## 2017-11-03 ENCOUNTER — Ambulatory Visit: Payer: Medicare Other | Admitting: Physical Therapy

## 2017-11-03 DIAGNOSIS — R2681 Unsteadiness on feet: Secondary | ICD-10-CM

## 2017-11-03 DIAGNOSIS — R2689 Other abnormalities of gait and mobility: Secondary | ICD-10-CM | POA: Diagnosis not present

## 2017-11-03 DIAGNOSIS — M6281 Muscle weakness (generalized): Secondary | ICD-10-CM

## 2017-11-03 DIAGNOSIS — I69351 Hemiplegia and hemiparesis following cerebral infarction affecting right dominant side: Secondary | ICD-10-CM

## 2017-11-03 DIAGNOSIS — I69951 Hemiplegia and hemiparesis following unspecified cerebrovascular disease affecting right dominant side: Secondary | ICD-10-CM | POA: Diagnosis not present

## 2017-11-03 DIAGNOSIS — R293 Abnormal posture: Secondary | ICD-10-CM | POA: Diagnosis not present

## 2017-11-03 NOTE — Patient Instructions (Addendum)
Bridge    Lie on back with knees bent as shown, arms at your sides. Lift hips up off mat and hold in the air for 5 seconds. Slowly, lower hips back down to bed. Repeat _10_ times. Do _1-2_ sessions per day.  http://pm.exer.us/55   Copyright  VHI. All rights reserved.    Strengthening: Hip Abductor - Resisted    Lying flat on you back (not as pictured) With band looped around both legs above knees, push thighs apart. Hold for 5 seconds, then slowly bring knees back together.  Repeat _10_ times per set. Do _1_ sets per session. Do _1-2_ sessions per day.  http://orth.exer.us/688   Copyright  VHI. All rights reserved.  Functional Quadriceps: Sit to Stand    Sit on edge of chair, feet flat on floor. Stand upright, extending knees fully. Repeat _10_ times per set. Do _1_ sets per session. Do _1-2_ sessions per day.  http://orth.exer.us/735   Copyright  VHI. All rights reserved.   Perform these in a corner with a chair in front of you for safety:  Single Leg - Eyes Open    Holding support on the chair, lift right leg while maintaining balance over left leg. Hold for 20 seconds. Then lower right leg back down.  Then, still holding the chair for balance, lift the left leg up while balancing over the right leg. Hold for 20 seconds, then lower left leg back down to floor.  Repeat _3_ times standing on each leg per session. Do _1-2_ sessions per day.  Copyright  VHI. All rights reserved.   Feet Apart (Compliant Surface) Head Motion - Eyes Closed    Stand on compliant surface: pillow/s_ with feet shoulder width apart. Close eyes and move head slowly: 1. Up and down x 10 reps 2. Left and right x 10 reps 3. Up right/down left x 10 reps 4. Up left/down right x 10 reps  Do _1-2_ sessions per day.  Copyright  VHI. All rights reserved.

## 2017-11-04 NOTE — Therapy (Signed)
[facility] [address] [place], [place], [address] Phone: [phone]   Fax:  [phone]  Physical Therapy Treatment  Patient Details  Name: Kristi David MRN: [id number] Date of Birth: Jan 04, 1941 Referring Provider: Dr. Riley Kill   Encounter Date: 11/03/2017  PT End of Session - 11/03/17 1321    Visit Number  2    Number of Visits  18    Date for PT Re-Evaluation  12/30/17    Authorization Type  Medicare     Authorization Time Period  11/01/2017----12/30/2017    PT Start Time  1318    PT Stop Time  1400    PT Time Calculation (min)  42 min    Equipment Utilized During Treatment  Gait belt    Activity Tolerance  Patient tolerated treatment well    Behavior During Therapy  [facility] for tasks assessed/performed       Past Medical History:  Diagnosis Date  . Acute blood loss anemia   . Anemia   . Arthritis    "legs, knees" (01/07/2016)  . Arthritis of right hand 01/11/2017  . Diabetes mellitus (HCC)   . Diastolic dysfunction   . Dysarthria, post-stroke   . Dysphagia, post-stroke   . Facial droop   . GERD (gastroesophageal reflux disease)    no meds  . High cholesterol   . History of blood transfusion   . Hyperlipidemia   . Hypertension   . Hypokalemia   . Left basal ganglia embolic stroke (HCC) 07/18/2017  . Osteoarthritis of right knee 01/06/2016  . Seasonal allergies   . Shortness of breath dyspnea    if too active  . Stroke (HCC) 07/16/2017  . Syncope    passed out and was hospitalized for a couple of days  . Type II diabetes mellitus (HCC)    does not take medicine diet controlled    Past Surgical History:  Procedure Laterality Date  . BUNIONECTOMY Bilateral   . CATARACT EXTRACTION W/ INTRAOCULAR LENS  IMPLANT, BILATERAL Bilateral   . INGUINAL HERNIA REPAIR Right   . KNEE ARTHROSCOPY WITH EXCISION BAKER'S CYST Right   . MR LOWER LEG LEFT ([facility] HX) Left    after a break  . PARTIAL KNEE ARTHROPLASTY  Right 01/06/2016   Procedure: RIGHT UNICOMPARTMENTAL KNEE ARTHROPLASTY;  Surgeon: Kathryne Hitch, MD;  [facility] OR;  Service: Orthopedics;  Laterality: Right;  . VEIN LIGATION AND STRIPPING Bilateral     There were no vitals filed for this visit.  Subjective Assessment - 11/03/17 1321    Subjective  No new complaints. No falls or pain to report.     Limitations  Walking;House hold activities    Patient Stated Goals  pt wants to use R hand more efficently and walk without use of AD in community     Currently in Pain?  No/denies    Pain Score  0-No pain       Issued the following to HEP today with min cues on correct ex form/technique provided in session today. Min guard assist for balance activities needed. Bridge    Lie on back with knees bent as shown, arms at your sides. Lift hips up off mat and hold in the air for 5 seconds. Slowly, lower hips back down to bed. Repeat _10_ times. Do _1-2_ sessions per day.  http://pm.exer.us/55   Copyright  VHI. All rights reserved.    Strengthening: Hip Abductor - Resisted    Lying flat on you back (  not as pictured) With band looped around both legs above knees, push thighs apart. Hold for 5 seconds, then slowly bring knees back together.  Repeat _10_ times per set. Do _1_ sets per session. Do _1-2_ sessions per day.  http://orth.exer.us/688   Copyright  VHI. All rights reserved.  Functional Quadriceps: Sit to Stand    Sit on edge of chair, feet flat on floor. Stand upright, extending knees fully. Repeat _10_ times per set. Do _1_ sets per session. Do _1-2_ sessions per day.  http://orth.exer.us/735   Copyright  VHI. All rights reserved.   Perform these in a corner with a chair in front of you for safety:  Single Leg - Eyes Open    Holding support on the chair, lift right leg while maintaining balance over left leg. Hold for 20 seconds. Then lower right leg back down.  Then, still holding the chair for  balance, lift the left leg up while balancing over the right leg. Hold for 20 seconds, then lower left leg back down to floor.  Repeat _3_ times standing on each leg per session. Do _1-2_ sessions per day.  Copyright  VHI. All rights reserved.   Feet Apart (Compliant Surface) Head Motion - Eyes Closed    Stand on compliant surface: pillow/s_ with feet shoulder width apart. Close eyes and move head slowly: 1. Up and down x 10 reps 2. Left and right x 10 reps 3. Up right/down left x 10 reps 4. Up left/down right x 10 reps  Do _1-2_ sessions per day.  Copyright  VHI. All rights reserved.        PT Education - 11/03/17 1353    Education provided  Yes    Education Details  HEP for strengthening and balance    Person(s) Educated  Patient    Methods  Explanation;Demonstration;Verbal cues;Handout    Comprehension  Verbalized understanding;Verbal cues required;Need further instruction;Returned demonstration       PT Short Term Goals - 11/01/17 1459      PT SHORT TERM GOAL #1   Title  Pt will deomonstate independence with initial HEP    Time  4    Period  Weeks    Status  New    Target Date  11/29/17      PT SHORT TERM GOAL #2   Title  pt will demonstrate tandem walking 15steps min guard     Time  4    Period  Weeks    Status  New    Target Date  11/29/17      PT SHORT TERM GOAL #3   Title  Pt will demonstrate stepping over objects 6-8" tall without AD with supervision  in order to reduce falls risk     Time  4    Period  Weeks    Status  New    Target Date  11/29/17      PT SHORT TERM GOAL #4   Title  pt will ambulate 600' without AD performing naming task with continual gait with supervision.     Baseline  --    Time  4    Period  Weeks    Status  New    Target Date  11/29/17        PT Long Term Goals - 11/01/17 1501      PT LONG TERM GOAL #1   Title  pt will demonstrate independence in ongoing HEP/ Fitness program in order to continue to maintain  improvement made  in PT    Time  2    Period  Months    Status  New    Target Date  12/31/17      PT LONG TERM GOAL #2   Title  Pt will demonstate a standard Timed Up-Go score of </=14.0" and a cognitive TUG score of</=15.0"    in order to improve functional gait     Baseline  standard-18.20" Cognitive 36.91"    Time  2    Period  Months    Status  New    Target Date  12/31/17      PT LONG TERM GOAL #3   Title  Pt will demonstate an Functional Gait Assessment score  >/= 24/30 in order to furhter improve funtional gait     Time  2    Period  Months    Status  New    Target Date  12/31/17      PT LONG TERM GOAL #4   Title  pt dill demonstrate gait speed of >/=2.62 ft/sec safely in order to indicate a safe community level ambulator    Baseline  2.6501ft/s    Time  2    Period  Months    Status  New    Target Date  12/31/17      PT LONG TERM GOAL #5   Title  pt will negotiate ramps,curbs,grass, and short paved distance without device modified independent to indicate ability for community access.     Baseline  min assist on curbs, supervision on ramps     Time  2    Period  Months    Status  New    Target Date  12/31/17      PT LONG TERM GOAL #6   Title  Pt will ambulate >/= 1000' while maintaining light conversation with fluent gait without device modified independent to enbable community mobility.     Time  2    Period  Months    Status  New    Target Date  12/31/17            Plan - 11/03/17 1322    Clinical Impression Statement  Today's skilled session focused on establishment of an HEP for home to address strengthening and balance. No issues reported in session. Pt is progressing toward goals and should benefit from continued PT to progress toward unmet goals.     Rehab Potential  Good    PT Frequency  2x / week    PT Duration  Other (comment) 9 weeks (60 days)    PT Treatment/Interventions  ADLs/Self Care Home Management;Aquatic  Therapy;Biofeedback;Cryotherapy;Electrical Stimulation;Moist Heat;Traction;Ultrasound;DME Instruction;Stair training;Functional mobility training;Therapeutic activities;Therapeutic exercise;Balance training;Neuromuscular re-education;Cognitive remediation;Patient/family education;Orthotic Fit/Training;Manual techniques;Passive range of motion;Taping    PT Next Visit Plan  continue to work on LE strengthening, balance on compliant surfaces, single leg stance balance and dual/multi tasking activities.     Consulted and Agree with Plan of Care  Patient;Family member/caregiver    Family Member Consulted  Son        Patient will benefit from skilled therapeutic intervention in order to improve the following deficits and impairments:  Abnormal gait, Decreased balance, Decreased mobility, Difficulty walking, Decreased cognition, Decreased activity tolerance, Decreased safety awareness, Decreased strength, Impaired UE functional use, Decreased endurance, Postural dysfunction  Visit Diagnosis: Muscle weakness (generalized)  Unsteadiness on feet  Other abnormalities of gait and mobility  Hemiplegia and hemiparesis following cerebral infarction affecting right dominant side (HCC)  Problem List Patient Active Problem List   Diagnosis Date Noted  . Left basal ganglia embolic stroke (HCC) 07/18/2017  . Facial droop   . Right sided weakness   . Diabetes mellitus (HCC)   . Diastolic dysfunction   . Dysphagia, post-stroke   . Dysarthria, post-stroke   . Hypokalemia   . Acute blood loss anemia   . Hyperlipidemia   . Stroke (HCC) 07/16/2017  . Hypertension 07/16/2017  . Arthritis of right hand 01/11/2017  . Osteoarthritis of right knee 01/06/2016  . Status post right partial knee replacement 01/06/2016    Sallyanne Kuster, PTA, [facility] [address] [place], [place] [address] [phone] 11/04/17, 8:44 AM   Name: Kristi David MRN: [id number] Date of  Birth: Oct 12, 1940

## 2017-11-07 DIAGNOSIS — I700 Atherosclerosis of aorta: Secondary | ICD-10-CM | POA: Diagnosis not present

## 2017-11-07 DIAGNOSIS — I10 Essential (primary) hypertension: Secondary | ICD-10-CM | POA: Diagnosis not present

## 2017-11-07 DIAGNOSIS — Z8673 Personal history of transient ischemic attack (TIA), and cerebral infarction without residual deficits: Secondary | ICD-10-CM | POA: Diagnosis not present

## 2017-11-07 DIAGNOSIS — G629 Polyneuropathy, unspecified: Secondary | ICD-10-CM | POA: Diagnosis not present

## 2017-11-07 DIAGNOSIS — K589 Irritable bowel syndrome without diarrhea: Secondary | ICD-10-CM | POA: Diagnosis not present

## 2017-11-07 DIAGNOSIS — N3281 Overactive bladder: Secondary | ICD-10-CM | POA: Diagnosis not present

## 2017-11-08 ENCOUNTER — Ambulatory Visit: Payer: Medicare Other | Admitting: Occupational Therapy

## 2017-11-11 ENCOUNTER — Ambulatory Visit: Payer: Medicare Other | Admitting: Physical Therapy

## 2017-11-11 ENCOUNTER — Encounter: Payer: Self-pay | Admitting: Physical Therapy

## 2017-11-11 DIAGNOSIS — R293 Abnormal posture: Secondary | ICD-10-CM | POA: Diagnosis not present

## 2017-11-11 DIAGNOSIS — R2681 Unsteadiness on feet: Secondary | ICD-10-CM

## 2017-11-11 DIAGNOSIS — M6281 Muscle weakness (generalized): Secondary | ICD-10-CM

## 2017-11-11 DIAGNOSIS — I69351 Hemiplegia and hemiparesis following cerebral infarction affecting right dominant side: Secondary | ICD-10-CM | POA: Diagnosis not present

## 2017-11-11 DIAGNOSIS — I69951 Hemiplegia and hemiparesis following unspecified cerebrovascular disease affecting right dominant side: Secondary | ICD-10-CM | POA: Diagnosis not present

## 2017-11-11 DIAGNOSIS — R2689 Other abnormalities of gait and mobility: Secondary | ICD-10-CM

## 2017-11-12 NOTE — Therapy (Signed)
[facility] [address] [place], [place], [address] Phone: [phone]   Fax:  [phone]  Physical Therapy Treatment  Patient Details  Name: Kristi David MRN: [id number] Date of Birth: January 13, 1941 Referring Provider: Dr. Riley Kill   Encounter Date: 11/11/2017  PT End of Session - 11/11/17 1454    Visit Number  3    Number of Visits  18    Date for PT Re-Evaluation  12/30/17    Authorization Type  Medicare     Authorization Time Period  11/01/2017----12/30/2017    PT Start Time  1450    PT Stop Time  1530    PT Time Calculation (min)  40 min    Equipment Utilized During Treatment  Gait belt    Activity Tolerance  Patient tolerated treatment well    Behavior During Therapy  [facility] for tasks assessed/performed       Past Medical History:  Diagnosis Date  . Acute blood loss anemia   . Anemia   . Arthritis    "legs, knees" (01/07/2016)  . Arthritis of right hand 01/11/2017  . Diabetes mellitus (HCC)   . Diastolic dysfunction   . Dysarthria, post-stroke   . Dysphagia, post-stroke   . Facial droop   . GERD (gastroesophageal reflux disease)    no meds  . High cholesterol   . History of blood transfusion   . Hyperlipidemia   . Hypertension   . Hypokalemia   . Left basal ganglia embolic stroke (HCC) 07/18/2017  . Osteoarthritis of right knee 01/06/2016  . Seasonal allergies   . Shortness of breath dyspnea    if too active  . Stroke (HCC) 07/16/2017  . Syncope    passed out and was hospitalized for a couple of days  . Type II diabetes mellitus (HCC)    does not take medicine diet controlled    Past Surgical History:  Procedure Laterality Date  . BUNIONECTOMY Bilateral   . CATARACT EXTRACTION W/ INTRAOCULAR LENS  IMPLANT, BILATERAL Bilateral   . INGUINAL HERNIA REPAIR Right   . KNEE ARTHROSCOPY WITH EXCISION BAKER'S CYST Right   . MR LOWER LEG LEFT ([facility] HX) Left    after a break  . PARTIAL KNEE ARTHROPLASTY  Right 01/06/2016   Procedure: RIGHT UNICOMPARTMENTAL KNEE ARTHROPLASTY;  Surgeon: Kathryne Hitch, MD;  [facility];  Service: Orthopedics;  Laterality: Right;  . VEIN LIGATION AND STRIPPING Bilateral     There were no vitals filed for this visit.  Subjective Assessment - 11/11/17 1453    Subjective  No new complaints. No falls or pain to report. Did the HEP and reports no issues with it.    Patient is accompained by:  Family member    Limitations  Walking;House hold activities    Patient Stated Goals  pt wants to use R hand more efficently and walk without use of AD in community     Currently in Pain?  No/denies    Pain Score  0-No pain         [facility] Adult PT Treatment/Exercise - 11/11/17 1455      Transfers   Transfers  Sit to Stand;Stand to Sit    Sit to Stand  5: Supervision;From chair/3-in-1;With upper extremity assist;With armrests    Stand to Sit  5: Supervision;With upper extremity assist;To chair/3-in-1;With armrests      Ambulation/Gait   Ambulation/Gait  Yes    Ambulation/Gait Assistance  4: Min assist;4: Min guard min assist  on ladder, min guard/min on track    Ambulation/Gait Assistance Details  after use of ladder worked on carryover on track, progressing to incorporating enviroment scanning and speed changes.     Ambulation Distance (Feet)  230 Feet    Assistive device  None    Gait Pattern  Step-through pattern;Decreased arm swing - right;Decreased dorsiflexion - right;Right hip hike;Lateral trunk lean to left;Poor foot clearance - right;Decreased stance time - right;Decreased stride length;Decreased hip/knee flexion - right;Decreased weight shift to right;Right genu recurvatum;Abducted- right;Shuffle    Ambulation Surface  Level;Outdoor    [employer] CorporationPre-Gait Activities  use of floor ladder with white slats: emphasis on reciprocal stepping with heel>toe step progression.       High Level Balance   High Level Balance Activities  Marching forwards;Marching  backwards;Tandem walking;Other (comment) tandem fwd/bwd, toe walk fwd/bwd    High Level Balance Comments  on blue mat with intermittent lateral arm touch to bars for balance assist, 2 laps each with cues on form/technique. min guard to min assist for balance.           Balance Exercises - 11/11/17 1511      Balance Exercises: Standing   Balance Beam  standing across blue foam beam: alternating fwd stepping to floor/back onto beam, then alternating bwd stepping to floor/back onto beam. 8-10 reps each LE with no UE support to occasional arm touch laterally to bars for balance. min guard to min assit for balance with cues on step length, weight shifitng.           PT Short Term Goals - 11/01/17 1459      PT SHORT TERM GOAL #1   Title  Pt will deomonstate independence with initial HEP    Time  4    Period  Weeks    Status  New    Target Date  11/29/17      PT SHORT TERM GOAL #2   Title  pt will demonstrate tandem walking 15steps min guard     Time  4    Period  Weeks    Status  New    Target Date  11/29/17      PT SHORT TERM GOAL #3   Title  Pt will demonstrate stepping over objects 6-8" tall without AD with supervision  in order to reduce falls risk     Time  4    Period  Weeks    Status  New    Target Date  11/29/17      PT SHORT TERM GOAL #4   Title  pt will ambulate 600' without AD performing naming task with continual gait with supervision.     Baseline  --    Time  4    Period  Weeks    Status  New    Target Date  11/29/17        PT Long Term Goals - 11/01/17 1501      PT LONG TERM GOAL #1   Title  pt will demonstrate independence in ongoing HEP/ Fitness program in order to continue to maintain improvement made in PT    Time  2    Period  Months    Status  New    Target Date  12/31/17      PT LONG TERM GOAL #2   Title  Pt will demonstate a standard Timed Up-Go score of </=14.0" and a cognitive TUG score of</=15.0"    in order to improve functional gait  Baseline  standard-18.20" Cognitive 36.91"    Time  2    Period  Months    Status  New    Target Date  12/31/17      PT LONG TERM GOAL #3   Title  Pt will demonstate an Functional Gait Assessment score  >/= 24/30 in order to furhter improve funtional gait     Time  2    Period  Months    Status  New    Target Date  12/31/17      PT LONG TERM GOAL #4   Title  pt dill demonstrate gait speed of >/=2.62 ft/sec safely in order to indicate a safe community level ambulator    Baseline  2.34ft/s    Time  2    Period  Months    Status  New    Target Date  12/31/17      PT LONG TERM GOAL #5   Title  pt will negotiate ramps,curbs,grass, and short paved distance without device modified independent to indicate ability for community access.     Baseline  min assist on curbs, supervision on ramps     Time  2    Period  Months    Status  New    Target Date  12/31/17      PT LONG TERM GOAL #6   Title  Pt will ambulate >/= 1000' while maintaining light conversation with fluent gait without device modified independent to enbable community mobility.     Time  2    Period  Months    Status  New    Target Date  12/31/17            Plan - 11/11/17 1454    Clinical Impression Statement  Today's skilled session contineud to address gait with no AD, balance reactions and stepping strategies with no issues reported. Pt is progressing toward goals and should benefit from continued PT to progress toward unmet goals.     Rehab Potential  Good    PT Frequency  2x / week    PT Duration  Other (comment) 9 weeks (60 days)    PT Treatment/Interventions  ADLs/Self Care Home Management;Aquatic Therapy;Biofeedback;Cryotherapy;Electrical Stimulation;Moist Heat;Traction;Ultrasound;DME Instruction;Stair training;Functional mobility training;Therapeutic activities;Therapeutic exercise;Balance training;Neuromuscular re-education;Cognitive remediation;Patient/family education;Orthotic Fit/Training;Manual  techniques;Passive range of motion;Taping    PT Next Visit Plan  continue to work on LE strengthening, balance on compliant surfaces, single leg stance balance and dual/multi tasking activities.     Consulted and Agree with Plan of Care  Patient;Family member/caregiver    Family Member Consulted  Son        Patient will benefit from skilled therapeutic intervention in order to improve the following deficits and impairments:  Abnormal gait, Decreased balance, Decreased mobility, Difficulty walking, Decreased cognition, Decreased activity tolerance, Decreased safety awareness, Decreased strength, Impaired UE functional use, Decreased endurance, Postural dysfunction  Visit Diagnosis: Muscle weakness (generalized)  Unsteadiness on feet  Other abnormalities of gait and mobility  Hemiplegia and hemiparesis following cerebral infarction affecting right dominant side [facility])     Problem List Patient Active Problem List   Diagnosis Date Noted  . Left basal ganglia embolic stroke (HCC) 07/18/2017  . Facial droop   . Right sided weakness   . Diabetes mellitus (HCC)   . Diastolic dysfunction   . Dysphagia, post-stroke   . Dysarthria, post-stroke   . Hypokalemia   . Acute blood loss anemia   . Hyperlipidemia   . Stroke (HCC) 07/16/2017  .  Hypertension 07/16/2017  . Arthritis of right hand 01/11/2017  . Osteoarthritis of right knee 01/06/2016  . Status post right partial knee replacement 01/06/2016    Sallyanne Kuster, PTA, [facility] [address], [address] [place], [place] [address] [phone] 11/12/17, 3:32 PM   Name: Lakelyn Straus MRN: [id number] Date of Birth: [birth year]

## 2017-11-16 ENCOUNTER — Ambulatory Visit: Payer: Medicare Other | Admitting: Occupational Therapy

## 2017-11-16 ENCOUNTER — Encounter: Payer: Self-pay | Admitting: Occupational Therapy

## 2017-11-16 ENCOUNTER — Ambulatory Visit: Payer: Medicare Other | Admitting: Physical Therapy

## 2017-11-16 ENCOUNTER — Encounter: Payer: Self-pay | Admitting: Physical Therapy

## 2017-11-16 DIAGNOSIS — R2681 Unsteadiness on feet: Secondary | ICD-10-CM

## 2017-11-16 DIAGNOSIS — I69951 Hemiplegia and hemiparesis following unspecified cerebrovascular disease affecting right dominant side: Secondary | ICD-10-CM

## 2017-11-16 DIAGNOSIS — M79641 Pain in right hand: Secondary | ICD-10-CM

## 2017-11-16 DIAGNOSIS — M25611 Stiffness of right shoulder, not elsewhere classified: Secondary | ICD-10-CM

## 2017-11-16 DIAGNOSIS — I69351 Hemiplegia and hemiparesis following cerebral infarction affecting right dominant side: Secondary | ICD-10-CM | POA: Diagnosis not present

## 2017-11-16 DIAGNOSIS — R2689 Other abnormalities of gait and mobility: Secondary | ICD-10-CM | POA: Diagnosis not present

## 2017-11-16 DIAGNOSIS — M6281 Muscle weakness (generalized): Secondary | ICD-10-CM

## 2017-11-16 DIAGNOSIS — I69318 Other symptoms and signs involving cognitive functions following cerebral infarction: Secondary | ICD-10-CM

## 2017-11-16 DIAGNOSIS — M25511 Pain in right shoulder: Secondary | ICD-10-CM

## 2017-11-16 DIAGNOSIS — R293 Abnormal posture: Secondary | ICD-10-CM

## 2017-11-16 DIAGNOSIS — M25641 Stiffness of right hand, not elsewhere classified: Secondary | ICD-10-CM

## 2017-11-16 NOTE — Therapy (Signed)
[facility] [address] [place], [place], [address] Phone: [phone]   Fax:  [phone]  Occupational Therapy Treatment  Patient Details  Name: Kristi David MRN: [id number] Date of Birth: 03/06/1941 Referring Provider: Dr. Riley KillSwartz   Encounter Date: 11/16/2017  OT End of Session - 11/16/17 1501    Visit Number  2    Number of Visits  16    Date for OT Re-Evaluation  12/27/17    Authorization Type  medicare    Authorization Time Period  60 days    OT Start Time  1401    OT Stop Time  1445    OT Time Calculation (min)  44 min    Activity Tolerance  Patient tolerated treatment well       Past Medical History:  Diagnosis Date  . Acute blood loss anemia   . Anemia   . Arthritis    "legs, knees" (01/07/2016)  . Arthritis of right hand 01/11/2017  . Diabetes mellitus (HCC)   . Diastolic dysfunction   . Dysarthria, post-stroke   . Dysphagia, post-stroke   . Facial droop   . GERD (gastroesophageal reflux disease)    no meds  . High cholesterol   . History of blood transfusion   . Hyperlipidemia   . Hypertension   . Hypokalemia   . Left basal ganglia embolic stroke (HCC) 07/18/2017  . Osteoarthritis of right knee 01/06/2016  . Seasonal allergies   . Shortness of breath dyspnea    if too active  . Stroke (HCC) 07/16/2017  . Syncope    passed out and was hospitalized for a couple of days  . Type II diabetes mellitus (HCC)    does not take medicine diet controlled    Past Surgical History:  Procedure Laterality Date  . BUNIONECTOMY Bilateral   . CATARACT EXTRACTION W/ INTRAOCULAR LENS  IMPLANT, BILATERAL Bilateral   . INGUINAL HERNIA REPAIR Right   . KNEE ARTHROSCOPY WITH EXCISION BAKER'S CYST Right   . MR LOWER LEG LEFT ([facility] HX) Left    after a break  . PARTIAL KNEE ARTHROPLASTY Right 01/06/2016   Procedure: RIGHT UNICOMPARTMENTAL KNEE ARTHROPLASTY;  Surgeon: Kathryne Hitchhristopher Y Blackman, MD;  [facility];  Service: Orthopedics;  Laterality: Right;  . VEIN LIGATION AND STRIPPING Bilateral     There were no vitals filed for this visit.  Subjective Assessment - 11/16/17 1403    Subjective   My arm feels better (at end of session)    Pertinent History  L BG CVA 07/16/2017.  Depression, HTN, HLD, DM, peripheral neuropathy.      Patient Stated Goals  to use my arm better    Currently in Pain?  No/denies                   OT Treatments/Exercises (OP) - 11/16/17 0001      ADLs   ADL Comments  Reviewed OT goals and POC - pt in agreement. Pt given written copy of goals.       Neurological Re-education Exercises   Other Exercises 1  Neuro re ed in sidelying to address low to mid reach with RUE on small ball and facilitation at scapula for normal movement pattern.  Pt requires encouragement to move arm on body however with repetition pt's performance improved. Transitioned into supine and addressed AAROM for shoulder flexion -pt able to tolerate to approximately 95*.  Introduced PVC square to address chest presses as well as  place and hold.  Pt tolerated well.       Manual Therapy   Manual Therapy  Joint mobilization;Soft tissue mobilization;Scapular mobilization    Manual therapy comments  joint, soft tissue, scapular mob to address alignment in R shoulder gridle in supine and sidelying.  R scap positioned in abduction, elevation and upward rotation.  Pt fearful of pain and demonstrates significant "holding' through R shoulder girdle.  Pt eventually able to relax and allow some mobilizations.               OT Short Term Goals - 11/16/17 1459      OT SHORT TERM GOAL #1   Title  Pt will be mod I with HEP for RUE - 11/29/2017    Status  On-going      OT SHORT TERM GOAL #2   Title  Pt will rate pain no greater than 3/10 for PROM for shoulder flexion to 100* in supine to allow for adequate ROM for self care activities.     Status  On-going      OT SHORT TERM GOAL #3    Title  Pt will demonstrate ability to grasp cylindrical light weight object in R hand during light manipulation of item with L hand.     Status  On-going      OT SHORT TERM GOAL #4   Title  Pt will demonstrate ability for mod I for bathing at shower level    Status  On-going        OT Long Term Goals - 11/16/17 1459      OT LONG TERM GOAL #1   Title  Pt will be mod I with upgraded HEP prn - 12/27/2017    Status  On-going      OT LONG TERM GOAL #2   Title  Pt will rate pain in L shoulder no greater than 3/10 with functional use of LUE as stabilizer/occassional gross assist    Status  On-going      OT LONG TERM GOAL #3   Title  Pt will demonstrate ability to use RUE as stabilizer at least 50% of the time with basic self care activities.     Status  On-going      OT LONG TERM GOAL #4   Title  Pt will demonstrate shoulder flexion for RUE to at least 50* in preparation for low reach    Status  On-going            Plan - 11/16/17 1500    Clinical Impression Statement  Reviewed OT goals and POC with pt and pt in agreement.  Pt progressing toward goals.     Rehab Potential  Fair    Current Impairments/barriers affecting progress:  severity of UE deficits    OT Frequency  2x / week    OT Duration  8 weeks    OT Treatment/Interventions  Self-care/ADL training;Cryotherapy;Moist Heat;Electrical Stimulation;DME and/or AE instruction;Neuromuscular education;Therapeutic exercise;Functional Mobility Training;Manual Therapy;Passive range of motion;Splinting;Therapeutic activities;Cognitive remediation/compensation;Patient/family education;Balance training    Plan  manual therapy, NMR for RUE/trunk, postural control and balance, functional mobility, intiate HEP as possible    Consulted and Agree with Plan of Care  Patient       Patient will benefit from skilled therapeutic intervention in order to improve the following deficits and impairments:  Abnormal gait, Decreased activity tolerance,  Decreased cognition, Decreased balance, Decreased knowledge of use of DME, Decreased range of motion, Decreased mobility, Decreased strength, Increased edema, Difficulty walking,  Impaired UE functional use, Impaired tone, Impaired sensation, Pain  Visit Diagnosis: Muscle weakness (generalized)  Unsteadiness on feet  Spastic hemiplegia of right dominant side as late effect of cerebrovascular disease, unspecified cerebrovascular disease type (HCC)  Stiffness of right shoulder, not elsewhere classified  Stiffness of right hand, not elsewhere classified  Pain of right shoulder joint on movement  Pain in right hand  Abnormal posture  Other symptoms and signs involving cognitive functions following cerebral infarction    Problem List Patient Active Problem List   Diagnosis Date Noted  . Left basal ganglia embolic stroke (HCC) 07/18/2017  . Facial droop   . Right sided weakness   . Diabetes mellitus (HCC)   . Diastolic dysfunction   . Dysphagia, post-stroke   . Dysarthria, post-stroke   . Hypokalemia   . Acute blood loss anemia   . Hyperlipidemia   . Stroke (HCC) 07/16/2017  . Hypertension 07/16/2017  . Arthritis of right hand 01/11/2017  . Osteoarthritis of right knee 01/06/2016  . Status post right partial knee replacement 01/06/2016    Norton Pastel, OTR/L 11/16/2017, 3:04 PM  [facility] [address] [place], [place], [address] Phone: [phone]   Fax:  [phone]  Name: Kristi David MRN: [id number] Date of Birth: 08-05-1941

## 2017-11-16 NOTE — Patient Instructions (Addendum)
Bridge    Lie on back with knees bent as shown, arms at your sides. Lift hips up off mat and hold in the air for 5 seconds. Slowly, lower hips back down to bed. Repeat _10_ times. Do _1-2_ sessions per day.  http://pm.exer.us/55   Copyright  VHI. All rights reserved.    Strengthening: Hip Abductor - Resisted    Lying flat on you back (not as pictured) With band looped around both legs above knees, push thighs apart. Hold for 5 seconds, then slowly bring knees back together.  Repeat _10_ times per set. Do _1_ sets per session. Do _1-2_ sessions per day.  http://orth.exer.us/688   Copyright  VHI. All rights reserved.   COMBINE TWO EXERCISES ABOVE:  BRIDGE WITH ABDUCTION 1. Lift bottom off bed.  2. Spread your legs apart.  3. Bring legs back together. 4. Lower your bottom to bed. 5. Repeat 10 times.     Functional Quadriceps: Sit to Stand    Sit on edge of chair, feet flat on floor. Stand upright, extending knees fully. Repeat _10_ times per set. Do _1_ sets per session. Do _1-2_ sessions per day.  http://orth.exer.us/735   Copyright  VHI. All rights reserved.   Perform these in a corner with a chair in front of you for safety:  Single Leg - Eyes Open    Holding support on the chair, lift right leg while maintaining balance over left leg. Hold for 20 seconds. Then lower right leg back down.  Then, still holding the chair for balance, lift the left leg up while balancing over the right leg. Hold for 20 seconds, then lower left leg back down to floor.  Repeat _3_ times standing on each leg per session. Do _1-2_ sessions per day.  Copyright  VHI. All rights reserved.   Feet Apart (Compliant Surface) Head Motion - Eyes Closed    Stand on compliant surface: pillow/s_ with feet shoulder width apart. Close eyes and move head slowly: 1. Up and down x 10 reps 2. Left and right x 10 reps 3. Diagonals Up-right/down-left x 10 reps 4. Diagonal  Up-left/down-right x 10 reps  Do _1-2_ sessions per day.  Copyright  VHI. All rights reserved.

## 2017-11-17 NOTE — Therapy (Signed)
[facility] [address] [place], [place], [address] Phone: [phone]   Fax:  [phone]  Physical Therapy Treatment  Patient Details  Name: Kristi David MRN: [id number] Date of Birth: 10/31/1940 Referring Provider: Dr. Riley KillSwartz   Encounter Date: 11/16/2017  PT End of Session - 11/16/17 1624    Visit Number  4    Number of Visits  18    Date for PT Re-Evaluation  12/30/17    Authorization Type  Medicare     Authorization Time Period  11/01/2017----12/30/2017    PT Start Time  1316    PT Stop Time  1400    PT Time Calculation (min)  44 min    Equipment Utilized During Treatment  Gait belt    Activity Tolerance  Patient tolerated treatment well    Behavior During Therapy  [facility] for tasks assessed/performed       Past Medical History:  Diagnosis Date  . Acute blood loss anemia   . Anemia   . Arthritis    "legs, knees" (01/07/2016)  . Arthritis of right hand 01/11/2017  . Diabetes mellitus (HCC)   . Diastolic dysfunction   . Dysarthria, post-stroke   . Dysphagia, post-stroke   . Facial droop   . GERD (gastroesophageal reflux disease)    no meds  . High cholesterol   . History of blood transfusion   . Hyperlipidemia   . Hypertension   . Hypokalemia   . Left basal ganglia embolic stroke (HCC) 07/18/2017  . Osteoarthritis of right knee 01/06/2016  . Seasonal allergies   . Shortness of breath dyspnea    if too active  . Stroke (HCC) 07/16/2017  . Syncope    passed out and was hospitalized for a couple of days  . Type II diabetes mellitus (HCC)    does not take medicine diet controlled    Past Surgical History:  Procedure Laterality Date  . BUNIONECTOMY Bilateral   . CATARACT EXTRACTION W/ INTRAOCULAR LENS  IMPLANT, BILATERAL Bilateral   . INGUINAL HERNIA REPAIR Right   . KNEE ARTHROSCOPY WITH EXCISION BAKER'S CYST Right   . MR LOWER LEG LEFT ([facility] HX) Left    after a break  . PARTIAL KNEE ARTHROPLASTY  Right 01/06/2016   Procedure: RIGHT UNICOMPARTMENTAL KNEE ARTHROPLASTY;  Surgeon: Kathryne Hitchhristopher Y Blackman, MD;  [facility];  Service: Orthopedics;  Laterality: Right;  . VEIN LIGATION AND STRIPPING Bilateral     There were no vitals filed for this visit.  Subjective Assessment - 11/16/17 1320    Subjective  No falls. She has been doing her exercises.     Patient is accompained by:  Family member    Limitations  Walking;House hold activities    Patient Stated Goals  pt wants to use R hand more efficently and walk without use of AD in community     Currently in Pain?  No/denies                      [facility] Treatment/Exercise - 11/16/17 1315      Transfers   Transfers  Sit to Stand;Stand to Sit    Sit to Stand  5: Supervision;From chair/3-in-1;With upper extremity assist;With armrests    Stand to Sit  5: Supervision;With upper extremity assist;To chair/3-in-1;With armrests      Ambulation/Gait   Ambulation/Gait  Yes    Ambulation/Gait Assistance  5: Supervision open path of track    Ambulation/Gait Assistance  Details  verbal & tactile cues on upright posture    Ambulation Distance (Feet)  200 Feet    Assistive device  None    Gait Pattern  Step-through pattern;Decreased arm swing - right;Decreased dorsiflexion - right;Right hip hike;Lateral trunk lean to left;Poor foot clearance - right;Decreased stance time - right;Decreased stride length;Decreased hip/knee flexion - right;Decreased weight shift to right;Right genu recurvatum;Abducted- right;Shuffle    Ambulation Surface  Indoor;Level      High Level Balance   High Level Balance Activities  --    High Level Balance Comments  --          Balance Exercises - 11/16/17 1315      Balance Exercises: Standing   Standing Eyes Closed  Foam/compliant surface reviewed HEP head turns 4 directions in corner    SLS  Eyes open;Solid surface 10 reps rolling 5" ball ant/post & med/lat ea LE    Rockerboard   Anterior/posterior;Lateral;EC;10 seconds;Intermittent UE support tilts with balance recovery 10 reps each direction    Balance Beam  firm beam (black) standing crossways for hip strategy facilitation with head turns eyes open & progressed to stepping ant & post working on initiating step strategy      Pt performed HEP hooklying hip abd/add with red theraband (PT instructed to isolate one leg at time as LLE overpowering RLE) Single LE motion improved control & RLE engagement.  PT added bridging with hip abd/add. Reviewed corner exercise standing on foam eyes closed head turns.   PT Education - 11/16/17 1330    Education provided  Yes    Education Details  PT reviewed HEP with pt performing & added bridge with hip abd/add red theraband    Person(s) Educated  Patient    Methods  Explanation;Tactile cues;Verbal cues;Handout    Comprehension  Verbalized understanding;Returned demonstration;Verbal cues required;Tactile cues required       PT Short Term Goals - 11/16/17 1624      PT SHORT TERM GOAL #1   Title  Pt will deomonstate independence with initial HEP (All STGs Target Date 11/29/2017)    Time  4    Period  Weeks    Status  On-going    Target Date  11/29/17      PT SHORT TERM GOAL #2   Title  pt will demonstrate tandem walking 15steps min guard     Time  4    Period  Weeks    Status  On-going    Target Date  11/29/17      PT SHORT TERM GOAL #3   Title  Pt will demonstrate stepping over objects 6-8" tall without AD with supervision  in order to reduce falls risk     Time  4    Period  Weeks    Status  On-going    Target Date  11/29/17      PT SHORT TERM GOAL #4   Title  pt will ambulate 600' without AD performing naming task with continual gait with supervision.     Time  4    Period  Weeks    Status  On-going    Target Date  11/29/17        PT Long Term Goals - 11/16/17 1625      PT LONG TERM GOAL #1   Title  pt will demonstrate independence in ongoing HEP/ Fitness  program in order to continue to maintain improvement made in PT (All LTGs Target Date 12/31/2017)    Time  2    Period  Months    Status  On-going    Target Date  12/31/17      PT LONG TERM GOAL #2   Title  Pt will demonstate a standard Timed Up-Go score of </=14.0" and a cognitive TUG score of</=15.0"    in order to improve functional gait     Baseline  standard-18.20" Cognitive 36.91"    Time  2    Period  Months    Status  On-going    Target Date  12/31/17      PT LONG TERM GOAL #3   Title  Pt will demonstate an Functional Gait Assessment score  >/= 24/30 in order to furhter improve funtional gait     Time  2    Period  Months    Status  On-going    Target Date  12/31/17      PT LONG TERM GOAL #4   Title  pt dill demonstrate gait speed of >/=2.62 ft/sec safely in order to indicate a safe community level ambulator    Baseline  2.58ft/s    Time  2    Period  Months    Status  On-going    Target Date  12/31/17      PT LONG TERM GOAL #5   Title  pt will negotiate ramps,curbs,grass, and short paved distance without device modified independent to indicate ability for community access.     Baseline  min assist on curbs, supervision on ramps     Time  2    Period  Months    Status  On-going    Target Date  12/31/17      PT LONG TERM GOAL #6   Title  Pt will ambulate >/= 1000' while maintaining light conversation with fluent gait without device modified independent to enbable community mobility.     Time  2    Period  Months    Status  On-going    Target Date  12/31/17            Plan - 11/16/17 1627    Clinical Impression Statement  Patient appears to understand HEP better after review during today's session. Patient improved balance reactions with skilled PT balance activities.     Rehab Potential  Good    PT Frequency  2x / week    PT Duration  Other (comment) 9 weeks (60 days)    PT Treatment/Interventions  ADLs/Self Care Home Management;Aquatic  Therapy;Biofeedback;Cryotherapy;Electrical Stimulation;Moist Heat;Traction;Ultrasound;DME Instruction;Stair training;Functional mobility training;Therapeutic activities;Therapeutic exercise;Balance training;Neuromuscular re-education;Cognitive remediation;Patient/family education;Orthotic Fit/Training;Manual techniques;Passive range of motion;Taping    PT Next Visit Plan  continue to work on LE strengthening, balance on compliant surfaces, single leg stance balance and dual/multi tasking activities.     Consulted and Agree with Plan of Care  Patient       Patient will benefit from skilled therapeutic intervention in order to improve the following deficits and impairments:  Abnormal gait, Decreased balance, Decreased mobility, Difficulty walking, Decreased cognition, Decreased activity tolerance, Decreased safety awareness, Decreased strength, Impaired UE functional use, Decreased endurance, Postural dysfunction  Visit Diagnosis: Muscle weakness (generalized)  Unsteadiness on feet  Abnormal posture  Other abnormalities of gait and mobility     Problem List Patient Active Problem List   Diagnosis Date Noted  . Left basal ganglia embolic stroke (HCC) 07/18/2017  . Facial droop   . Right sided weakness   . Diabetes mellitus (HCC)   . Diastolic dysfunction   .  Dysphagia, post-stroke   . Dysarthria, post-stroke   . Hypokalemia   . Acute blood loss anemia   . Hyperlipidemia   . Stroke (HCC) 07/16/2017  . Hypertension 07/16/2017  . Arthritis of right hand 01/11/2017  . Osteoarthritis of right knee 01/06/2016  . Status post right partial knee replacement 01/06/2016    Kristi David  PT, DPT 11/17/2017, 6:37 AM  [facility] [address] [place], [place], [address] Phone: [phone]   Fax:  [phone]  Name: Kristi David MRN: [id number] Date of Birth: 01/29/1941

## 2017-11-21 ENCOUNTER — Encounter: Payer: Self-pay | Admitting: Physical Therapy

## 2017-11-21 ENCOUNTER — Ambulatory Visit: Payer: Medicare Other | Admitting: Physical Therapy

## 2017-11-21 ENCOUNTER — Encounter: Payer: Self-pay | Admitting: Occupational Therapy

## 2017-11-21 ENCOUNTER — Ambulatory Visit: Payer: Medicare Other | Admitting: Occupational Therapy

## 2017-11-21 DIAGNOSIS — M6281 Muscle weakness (generalized): Secondary | ICD-10-CM | POA: Diagnosis not present

## 2017-11-21 DIAGNOSIS — M25611 Stiffness of right shoulder, not elsewhere classified: Secondary | ICD-10-CM

## 2017-11-21 DIAGNOSIS — R2681 Unsteadiness on feet: Secondary | ICD-10-CM

## 2017-11-21 DIAGNOSIS — R293 Abnormal posture: Secondary | ICD-10-CM | POA: Diagnosis not present

## 2017-11-21 DIAGNOSIS — I69351 Hemiplegia and hemiparesis following cerebral infarction affecting right dominant side: Secondary | ICD-10-CM | POA: Diagnosis not present

## 2017-11-21 DIAGNOSIS — I69951 Hemiplegia and hemiparesis following unspecified cerebrovascular disease affecting right dominant side: Secondary | ICD-10-CM

## 2017-11-21 DIAGNOSIS — M25511 Pain in right shoulder: Secondary | ICD-10-CM

## 2017-11-21 DIAGNOSIS — I69318 Other symptoms and signs involving cognitive functions following cerebral infarction: Secondary | ICD-10-CM

## 2017-11-21 DIAGNOSIS — R2689 Other abnormalities of gait and mobility: Secondary | ICD-10-CM | POA: Diagnosis not present

## 2017-11-21 DIAGNOSIS — M79641 Pain in right hand: Secondary | ICD-10-CM

## 2017-11-21 DIAGNOSIS — M25641 Stiffness of right hand, not elsewhere classified: Secondary | ICD-10-CM

## 2017-11-21 NOTE — Therapy (Signed)
[facility] [address] [place], [place], [address] Phone: [phone]   Fax:  [phone]  Physical Therapy Treatment  Patient Details  Name: Kristi David MRN: [id number] Date of Birth: 22-Dec-1940 Referring Provider: Dr. Riley Kill   Encounter Date: 11/21/2017  PT End of Session - 11/21/17 1534    Visit Number  5    Number of Visits  18    Date for PT Re-Evaluation  12/30/17    Authorization Type  Medicare     Authorization Time Period  11/01/2017----12/30/2017    PT Start Time  1402    PT Stop Time  1445    PT Time Calculation (min)  43 min    Equipment Utilized During Treatment  Gait belt    Activity Tolerance  Patient tolerated treatment well    Behavior During Therapy  [facility] for tasks assessed/performed       Past Medical History:  Diagnosis Date  . Acute blood loss anemia   . Anemia   . Arthritis    "legs, knees" (01/07/2016)  . Arthritis of right hand 01/11/2017  . Diabetes mellitus (HCC)   . Diastolic dysfunction   . Dysarthria, post-stroke   . Dysphagia, post-stroke   . Facial droop   . GERD (gastroesophageal reflux disease)    no meds  . High cholesterol   . History of blood transfusion   . Hyperlipidemia   . Hypertension   . Hypokalemia   . Left basal ganglia embolic stroke (HCC) 07/18/2017  . Osteoarthritis of right knee 01/06/2016  . Seasonal allergies   . Shortness of breath dyspnea    if too active  . Stroke (HCC) 07/16/2017  . Syncope    passed out and was hospitalized for a couple of days  . Type II diabetes mellitus (HCC)    does not take medicine diet controlled    Past Surgical History:  Procedure Laterality Date  . BUNIONECTOMY Bilateral   . CATARACT EXTRACTION W/ INTRAOCULAR LENS  IMPLANT, BILATERAL Bilateral   . INGUINAL HERNIA REPAIR Right   . KNEE ARTHROSCOPY WITH EXCISION BAKER'S CYST Right   . MR LOWER LEG LEFT ([facility] HX) Left    after a break  . PARTIAL KNEE ARTHROPLASTY  Right 01/06/2016   Procedure: RIGHT UNICOMPARTMENTAL KNEE ARTHROPLASTY;  Surgeon: Kathryne Hitch, MD;  Location: [facility] OR;  Service: Orthopedics;  Laterality: Right;  . VEIN LIGATION AND STRIPPING Bilateral     There were no vitals filed for this visit.  Subjective Assessment - 11/21/17 1404    Subjective  Pt reported no falls, no changes in medication, and no pain today.     Limitations  Walking;House hold activities    Patient Stated Goals  pt wants to use R hand more efficently and walk without use of AD in community     Currently in Pain?  No/denies    Pain Score  0-No pain                      [facility] Adult PT Treatment/Exercise - 11/21/17 1458      Transfers   Transfers  Sit to Stand;Stand to Sit    Sit to Stand  5: Supervision;From chair/3-in-1;With upper extremity assist;With armrests;From bed Used PT table for STS's    Stand to Sit  5: Supervision;With upper extremity assist;To chair/3-in-1;With armrests;To bed Used PT table for STS's      Ambulation/Gait   Ambulation/Gait  Yes  Ambulation/Gait Assistance  5: Supervision    Ambulation/Gait Assistance Details  Verbal cues for upright posture and use of cane.    Ambulation Distance (Feet)  70 Feet 30 feet x 2, and 70 feet exiting clinic.    Assistive device  Straight cane    Gait Pattern  Step-through pattern;Decreased arm swing - right;Decreased dorsiflexion - right;Right hip hike;Lateral trunk lean to left;Poor foot clearance - right;Decreased stance time - right;Decreased stride length;Decreased hip/knee flexion - right;Decreased weight shift to right;Right genu recurvatum;Abducted- right;Shuffle    Ambulation Surface  Level;Indoor      High Level Balance   High Level Balance Activities  Other (comment) Stand on airex pad, Forward reaching and cone in cabinets      High Level Balance Comments  Performed task of standing on airex pad with functional task of placing cones on different level cabinets with  LUE to promote anterior weight shift during a dynamic balance activity. Pt required demonstration and verbal cues to perform task.      Lumbar Exercises: Seated   Sit to Stand  15 reps;Other (comment) with Airex pad below feet for eneven surface and balance     Sit to Stand Limitations  Pt required verbal, demonstration, and tactile cues for anteriorly shifting trunk to be able to performance task. Pt had increased difficulty with slow and controlled eccentric lowering.,       Lumbar Exercises: Supine   Bridge  10 reps;3 seconds;Other (comment) with Red tband resistance tied between knees.    Bridge Limitations  Noted hip compensation, feet tendency to shift, and difficulty maintaining 5 sec bridge concentric hold.            Balance Exercises - 11/21/17 1513      Balance Exercises: Standing   Standing Eyes Closed  Foam/compliant surface;Head turns;Other (comment) Head turn up/down, diagonal x 10 each, UE support intermed    Rockerboard  Anterior/posterior;Lateral;EC;10 seconds;Intermittent UE support    Balance Beam  firm beam (black) standing crossways for hip strategy facilitation with head turns eyes open & progressed to stepping ant & post working on initiating step strategy          PT Short Term Goals - 11/16/17 1624      PT SHORT TERM GOAL #1   Title  Pt will deomonstate independence with initial HEP (All STGs Target Date 11/29/2017)    Time  4    Period  Weeks    Status  On-going    Target Date  11/29/17      PT SHORT TERM GOAL #2   Title  pt will demonstrate tandem walking 15steps min guard     Time  4    Period  Weeks    Status  On-going    Target Date  11/29/17      PT SHORT TERM GOAL #3   Title  Pt will demonstrate stepping over objects 6-8" tall without AD with supervision  in order to reduce falls risk     Time  4    Period  Weeks    Status  On-going    Target Date  11/29/17      PT SHORT TERM GOAL #4   Title  pt will ambulate 600' without AD performing  naming task with continual gait with supervision.     Time  4    Period  Weeks    Status  On-going    Target Date  11/29/17  PT Long Term Goals - 11/16/17 1625      PT LONG TERM GOAL #1   Title  pt will demonstrate independence in ongoing HEP/ Fitness program in order to continue to maintain improvement made in PT (All LTGs Target Date 12/31/2017)    Time  2    Period  Months    Status  On-going    Target Date  12/31/17      PT LONG TERM GOAL #2   Title  Pt will demonstate a standard Timed Up-Go score of </=14.0" and a cognitive TUG score of</=15.0"    in order to improve functional gait     Baseline  standard-18.20" Cognitive 36.91"    Time  2    Period  Months    Status  On-going    Target Date  12/31/17      PT LONG TERM GOAL #3   Title  Pt will demonstate an Functional Gait Assessment score  >/= 24/30 in order to furhter improve funtional gait     Time  2    Period  Months    Status  On-going    Target Date  12/31/17      PT LONG TERM GOAL #4   Title  pt dill demonstrate gait speed of >/=2.62 ft/sec safely in order to indicate a safe community level ambulator    Baseline  2.62ft/s    Time  2    Period  Months    Status  On-going    Target Date  12/31/17      PT LONG TERM GOAL #5   Title  pt will negotiate ramps,curbs,grass, and short paved distance without device modified independent to indicate ability for community access.     Baseline  min assist on curbs, supervision on ramps     Time  2    Period  Months    Status  On-going    Target Date  12/31/17      PT LONG TERM GOAL #6   Title  Pt will ambulate >/= 1000' while maintaining light conversation with fluent gait without device modified independent to enbable community mobility.     Time  2    Period  Months    Status  On-going    Target Date  12/31/17            Plan - 11/22/17 1610    Clinical Impression Statement  Today's skilled session focused on static balance, dynamic balance, and LE  strengthening exercises. During standing on foam/ uneven surface on rockerboard with eyes closed, pt exhibited lateral swaying and posterior lean. Pt able to maintain upright posture without swaying with use of UE support. Followed up on pt HEP exercise due to patients request to progress level of theraband: bridge with resisted red tband with hip abd, and determined pt is not ready to progress to a green tband due to hip compensation to be able to perform exercises. During sit to stands, pt required max verbal, tactile, and demonstration cues to be able to perform task properly. STS required initiation of motion by rocking forward of trunk enough for pt to be able to perform task properly. Pt continues to make progress towards balance, gait, and HEP compliance goals. Pt will benefit from continued skilled PT to work todays unmet goals.     History and Personal Factors relevant to plan of care:  Basal ganglia, CVA, HTN, DM 2, peripheral neuropathy    Clinical Presentation  Evolving  Clinical Presentation due to:  high fall risk, lives alone    Rehab Potential  Good    PT Frequency  2x / week    PT Duration  Other (comment)    PT Treatment/Interventions  ADLs/Self Care Home Management;Aquatic Therapy;Biofeedback;Cryotherapy;Electrical Stimulation;Moist Heat;Traction;Ultrasound;DME Instruction;Stair training;Functional mobility training;Therapeutic activities;Therapeutic exercise;Balance training;Neuromuscular re-education;Cognitive remediation;Patient/family education;Orthotic Fit/Training;Manual techniques;Passive range of motion;Taping    PT Next Visit Plan  Plan to continue progressing dynamic balance, LE strengthening, and SLS single leg balance and dual/multi task activities.     Consulted and Agree with Plan of Care  Patient       Patient will benefit from skilled therapeutic intervention in order to improve the following deficits and impairments:  Abnormal gait, Decreased balance, Decreased  mobility, Difficulty walking, Decreased cognition, Decreased activity tolerance, Decreased safety awareness, Decreased strength, Impaired UE functional use, Decreased endurance, Postural dysfunction  Visit Diagnosis: Muscle weakness (generalized)  Unsteadiness on feet  Spastic hemiplegia of right dominant side as late effect of cerebrovascular disease, unspecified cerebrovascular disease type [facility])     Problem List Patient Active Problem List   Diagnosis Date Noted  . Left basal ganglia embolic stroke (HCC) 07/18/2017  . Facial droop   . Right sided weakness   . Diabetes mellitus (HCC)   . Diastolic dysfunction   . Dysphagia, post-stroke   . Dysarthria, post-stroke   . Hypokalemia   . Acute blood loss anemia   . Hyperlipidemia   . Stroke (HCC) 07/16/2017  . Hypertension 07/16/2017  . Arthritis of right hand 01/11/2017  . Osteoarthritis of right knee 01/06/2016  . Status post right partial knee replacement 01/06/2016    Kristi David 11/22/2017, 1:13 PM  [facility] [address] [place], [place], [address] Phone: [phone]   Fax:  [phone]  Name: Kristi David MRN: [id number] Date of Birth: 1941-07-26

## 2017-11-21 NOTE — Therapy (Signed)
[facility] [address] [place], [place], [address] Phone: [phone]   Fax:  [phone]  Occupational Therapy Treatment  Patient Details  Name: Kristi David MRN: [id number] Date of Birth: 11-20-1940 Referring Provider: Dr. Riley Kill   Encounter Date: 11/21/2017  OT End of Session - 11/21/17 1629    Visit Number  3    Number of Visits  16    Date for OT Re-Evaluation  12/27/17    Authorization Type  medicare    Authorization Time Period  60 days    OT Start Time  1317    OT Stop Time  1400    OT Time Calculation (min)  43 min    Activity Tolerance  Patient tolerated treatment well       Past Medical History:  Diagnosis Date  . Acute blood loss anemia   . Anemia   . Arthritis    "legs, knees" (01/07/2016)  . Arthritis of right hand 01/11/2017  . Diabetes mellitus (HCC)   . Diastolic dysfunction   . Dysarthria, post-stroke   . Dysphagia, post-stroke   . Facial droop   . GERD (gastroesophageal reflux disease)    no meds  . High cholesterol   . History of blood transfusion   . Hyperlipidemia   . Hypertension   . Hypokalemia   . Left basal ganglia embolic stroke (HCC) 07/18/2017  . Osteoarthritis of right knee 01/06/2016  . Seasonal allergies   . Shortness of breath dyspnea    if too active  . Stroke (HCC) 07/16/2017  . Syncope    passed out and was hospitalized for a couple of days  . Type II diabetes mellitus (HCC)    does not take medicine diet controlled    Past Surgical History:  Procedure Laterality Date  . BUNIONECTOMY Bilateral   . CATARACT EXTRACTION W/ INTRAOCULAR LENS  IMPLANT, BILATERAL Bilateral   . INGUINAL HERNIA REPAIR Right   . KNEE ARTHROSCOPY WITH EXCISION BAKER'S CYST Right   . MR LOWER LEG LEFT ([facility] HX) Left    after a break  . PARTIAL KNEE ARTHROPLASTY Right 01/06/2016   Procedure: RIGHT UNICOMPARTMENTAL KNEE ARTHROPLASTY;  Surgeon: Kathryne Hitch, MD;  Location: [facility]  OR;  Service: Orthopedics;  Laterality: Right;  . VEIN LIGATION AND STRIPPING Bilateral     There were no vitals filed for this visit.  Subjective Assessment - 11/21/17 1321    Subjective   I like this therapy    Pertinent History  L BG CVA 07/16/2017.  Depression, HTN, HLD, DM, peripheral neuropathy.      Patient Stated Goals  to use my arm better    Currently in Pain?  No/denies                   OT Treatments/Exercises (OP) - 11/21/17 1624      ADLs   ADL Comments  Instructed pt in strategies to allow pt to be mod I with bathing at both sink and shower (seated) level. Pt able to return demonstrate after instruction. Pt given information on where to obtain LH sponge as well as strategies using 1 handed bathing techniques.        Neurological Re-education Exercises   Other Exercises 1  Neuro re ed in supine to address mid and overhead bilateral reach in closed chain activity.  Progressed to unilateral mid reach in supine (open chain) as well as place and hold (open chain) with RUE - pt needs  min facilitation to avoid shoulder hike and to fully extend elbow with reach.  Pt able to achieve approximately 100* of shoulder flexion in supine with 0/10 pain .  Will continue to address and work toward development of HEP that pt can do independently at home (pt still needing some assistance at this point).                 OT Short Term Goals - 11/21/17 1626      OT SHORT TERM GOAL #1   Title  Pt will be mod I with HEP for RUE - 11/29/2017    Status  On-going      OT SHORT TERM GOAL #2   Title  Pt will rate pain no greater than 3/10 for PROM for shoulder flexion to 100* in supine to allow for adequate ROM for self care activities.     Status  Achieved      OT SHORT TERM GOAL #3   Title  Pt will demonstrate ability to grasp cylindrical light weight object in R hand during light manipulation of item with L hand.     Status  On-going      OT SHORT TERM GOAL #4   Title  Pt  will demonstrate ability for mod I for bathing at shower level    Status  Achieved        OT Long Term Goals - 11/21/17 1628      OT LONG TERM GOAL #1   Title  Pt will be mod I with upgraded HEP prn - 12/27/2017    Status  On-going      OT LONG TERM GOAL #2   Title  Pt will rate pain in L shoulder no greater than 3/10 with functional use of LUE as stabilizer/occassional gross assist    Status  On-going      OT LONG TERM GOAL #3   Title  Pt will demonstrate ability to use RUE as stabilizer at least 50% of the time with basic self care activities.     Status  On-going      OT LONG TERM GOAL #4   Title  Pt will demonstrate shoulder flexion for RUE to at least 50* in preparation for low reach    Status  On-going            Plan - 11/21/17 1628    Clinical Impression Statement  Pt progressing toward goals.  Pt will require significant repetition for motor learning due to learned patterns of movement.     Rehab Potential  Fair    Current Impairments/barriers affecting progress:  severity of UE deficits    OT Frequency  2x / week    OT Duration  8 weeks    OT Treatment/Interventions  Self-care/ADL training;Cryotherapy;Moist Heat;Electrical Stimulation;DME and/or AE instruction;Neuromuscular education;Therapeutic exercise;Functional Mobility Training;Manual Therapy;Passive range of motion;Splinting;Therapeutic activities;Cognitive remediation/compensation;Patient/family education;Balance training    Plan  manual therapy, NMR for RUE/trunk, postural control and balance, functional mobility, intiate HEP as possible    Consulted and Agree with Plan of Care  Patient       Patient will benefit from skilled therapeutic intervention in order to improve the following deficits and impairments:  Abnormal gait, Decreased activity tolerance, Decreased cognition, Decreased balance, Decreased knowledge of use of DME, Decreased range of motion, Decreased mobility, Decreased strength, Increased  edema, Difficulty walking, Impaired UE functional use, Impaired tone, Impaired sensation, Pain  Visit Diagnosis: Muscle weakness (generalized)  Unsteadiness on feet  Abnormal  posture  Spastic hemiplegia of right dominant side as late effect of cerebrovascular disease, unspecified cerebrovascular disease type (HCC)  Stiffness of right shoulder, not elsewhere classified  Stiffness of right hand, not elsewhere classified  Pain of right shoulder joint on movement  Pain in right hand  Other symptoms and signs involving cognitive functions following cerebral infarction    Problem List Patient Active Problem List   Diagnosis Date Noted  . Left basal ganglia embolic stroke (HCC) 07/18/2017  . Facial droop   . Right sided weakness   . Diabetes mellitus (HCC)   . Diastolic dysfunction   . Dysphagia, post-stroke   . Dysarthria, post-stroke   . Hypokalemia   . Acute blood loss anemia   . Hyperlipidemia   . Stroke (HCC) 07/16/2017  . Hypertension 07/16/2017  . Arthritis of right hand 01/11/2017  . Osteoarthritis of right knee 01/06/2016  . Status post right partial knee replacement 01/06/2016    Norton Pastel, OTR/L 11/21/2017, 4:44 PM  [facility] [address] [place], [place], [address] Phone: [phone]   Fax:  [phone]  Name: Keerthi Hazell MRN: [id number] Date of Birth: 08/19/1941

## 2017-11-24 ENCOUNTER — Ambulatory Visit: Payer: Medicare Other | Admitting: Occupational Therapy

## 2017-11-24 ENCOUNTER — Encounter: Payer: Self-pay | Admitting: Physical Therapy

## 2017-11-24 ENCOUNTER — Ambulatory Visit: Payer: Medicare Other | Admitting: Physical Therapy

## 2017-11-24 ENCOUNTER — Encounter: Payer: Self-pay | Admitting: Occupational Therapy

## 2017-11-24 DIAGNOSIS — R2681 Unsteadiness on feet: Secondary | ICD-10-CM

## 2017-11-24 DIAGNOSIS — R2689 Other abnormalities of gait and mobility: Secondary | ICD-10-CM | POA: Diagnosis not present

## 2017-11-24 DIAGNOSIS — M79641 Pain in right hand: Secondary | ICD-10-CM

## 2017-11-24 DIAGNOSIS — R293 Abnormal posture: Secondary | ICD-10-CM | POA: Diagnosis not present

## 2017-11-24 DIAGNOSIS — M25511 Pain in right shoulder: Secondary | ICD-10-CM

## 2017-11-24 DIAGNOSIS — I69318 Other symptoms and signs involving cognitive functions following cerebral infarction: Secondary | ICD-10-CM

## 2017-11-24 DIAGNOSIS — I69351 Hemiplegia and hemiparesis following cerebral infarction affecting right dominant side: Secondary | ICD-10-CM | POA: Diagnosis not present

## 2017-11-24 DIAGNOSIS — M6281 Muscle weakness (generalized): Secondary | ICD-10-CM

## 2017-11-24 DIAGNOSIS — M25611 Stiffness of right shoulder, not elsewhere classified: Secondary | ICD-10-CM

## 2017-11-24 DIAGNOSIS — I69951 Hemiplegia and hemiparesis following unspecified cerebrovascular disease affecting right dominant side: Secondary | ICD-10-CM

## 2017-11-24 DIAGNOSIS — M25641 Stiffness of right hand, not elsewhere classified: Secondary | ICD-10-CM

## 2017-11-24 NOTE — Therapy (Signed)
[facility] [address] [place], [place], [address] Phone: [phone]   Fax:  [phone]  Physical Therapy Treatment  Patient Details  Name: Kristi David MRN: [id number] Date of Birth: 1941/08/28 Referring Provider: Dr. Naaman Plummer   Encounter Date: 11/24/2017  PT End of Session - 11/24/17 1538    Visit Number  6    Number of Visits  18    Date for PT Re-Evaluation  12/30/17    Authorization Type  Medicare     Authorization Time Period  11/01/2017----12/30/2017    PT Start Time  1500 Pt arrived late to PT tx due to transportation ([facility])    PT Stop Time  1530    PT Time Calculation (min)  30 min    Equipment Utilized During Treatment  Gait belt    Activity Tolerance  Patient tolerated treatment well    Behavior During Therapy  [facility] for tasks assessed/performed       Past Medical History:  Diagnosis Date  . Acute blood loss anemia   . Anemia   . Arthritis    "legs, knees" (01/07/2016)  . Arthritis of right hand 01/11/2017  . Diabetes mellitus ([place])   . Diastolic dysfunction   . Dysarthria, post-stroke   . Dysphagia, post-stroke   . Facial droop   . GERD (gastroesophageal reflux disease)    no meds  . High cholesterol   . History of blood transfusion   . Hyperlipidemia   . Hypertension   . Hypokalemia   . Left basal ganglia embolic stroke ([place]) 33/29/5188  . Osteoarthritis of right knee 01/06/2016  . Seasonal allergies   . Shortness of breath dyspnea    if too active  . Stroke ([place]) 07/16/2017  . Syncope    passed out and was hospitalized for a couple of days  . Type II diabetes mellitus ([place])    does not take medicine diet controlled    Past Surgical History:  Procedure Laterality Date  . BUNIONECTOMY Bilateral   . CATARACT EXTRACTION W/ INTRAOCULAR LENS  IMPLANT, BILATERAL Bilateral   . INGUINAL HERNIA REPAIR Right   . KNEE ARTHROSCOPY WITH EXCISION BAKER'S CYST Right   . MR LOWER LEG LEFT ([place] HX)  Left    after a break  . PARTIAL KNEE ARTHROPLASTY Right 01/06/2016   Procedure: RIGHT UNICOMPARTMENTAL KNEE ARTHROPLASTY;  Surgeon: Mcarthur Rossetti, MD;  Location: [facility];  Service: Orthopedics;  Laterality: Right;  . VEIN LIGATION AND STRIPPING Bilateral     There were no vitals filed for this visit.  Subjective Assessment - 11/24/17 1506    Subjective  Pt reported no falls, no changes in medication, and no pain today. Pt stated, "I am late today because of SCAT (transportation)."      Patient is accompained by:  Family member    Limitations  Walking;House hold activities    Patient Stated Goals  pt wants to use R hand more efficently and walk without use of AD in community     Currently in Pain?  No/denies    Pain Score  0-No pain        [facility] Adult PT Treatment/Exercise - 11/25/17 0001      Ambulation/Gait   Ambulation/Gait  Yes    Ambulation/Gait Assistance  5: Supervision    Ambulation/Gait Assistance Details  Verbal cues for upright posture    Ambulation Distance (Feet)  650 Feet    Assistive device  None    Gait Pattern  Step-through pattern;Decreased arm swing - right;Decreased dorsiflexion - right;Right hip hike;Lateral trunk lean to left;Poor foot clearance - right;Decreased stance time - right;Decreased stride length;Decreased hip/knee flexion - right;Decreased weight shift to right;Right genu recurvatum;Abducted- right;Shuffle    Ambulation Surface  Level;Indoor    Gait Comments  Pt able to perform ambulation without use of an AD. While walk and scanning gait speed was reduced. But overall pt demonstrated ability to walk longer distance without any loss of balance episodes.       High Level Balance   High Level Balance Activities  Negotiating over obstacles;Other (comment) Walking forward stepping over 4 and 6 inch wooden beam x 6    High Level Balance Comments  Pt able to perform task safely without any loss of balance. Required initial demo and verbal cues for task  performance.        Balance Exercises - 11/25/17 1315      Balance Exercises: Standing   Tandem Gait  Forward;Intermittent upper extremity support;4 reps;Other (comment);Limitations Performed at parallel bars. Interm. UE assist for balance     Overall Comments  Other (comment) Pt required demo and verbal cues for pacing and sequencing       PT Short Term Goals - 11/24/17 1540      PT SHORT TERM GOAL #1   Title  Pt will deomonstate independence with initial HEP (All STGs Target Date 11/29/2017)    Baseline  11/24/17- Pt verbally expressed confidence with HEP independence and initial HEP.     Status  Achieved    Target Date  11/29/17      PT SHORT TERM GOAL #2   Title  pt will demonstrate tandem walking 15steps min guard     Baseline  11/24/17: 9 steps with min guard, single UE assistance needed afterwards.    Status  Partially Met    Target Date  11/29/17      PT SHORT TERM GOAL #3   Title  Pt will demonstrate stepping over objects 6-8" tall without AD with supervision  in order to reduce falls risk     Baseline  11/24/17: Pt demonstrated ability to step over objects 6-8" without AD with supervision in order to reduce falls risk.    Status  Achieved    Target Date  11/29/17      PT SHORT TERM GOAL #4   Title  pt will ambulate 600' without AD performing naming task with continual gait with supervision.     Baseline  11/24/17: Pt able to ambulate 650' without AD performing naming task with continual gait with supervision.     Status  Achieved    Target Date  11/29/17      PT SHORT TERM GOAL #5   Title  pain in right knee with daily activities decreased >/= 75%    Baseline  11/24/2017: Pt verbally expressed pain in right knee with daily activities decreased to >/= 25%    Status  Achieved        PT Long Term Goals - 11/16/17 1625      PT LONG TERM GOAL #1   Title  pt will demonstrate independence in ongoing HEP/ Fitness program in order to continue to maintain improvement made in  PT (All LTGs Target Date 12/31/2017)    Time  2    Period  Months    Status  On-going    Target Date  12/31/17      PT LONG TERM GOAL #2   Title  Pt will demonstate a standard Timed Up-Go score of </=14.0" and a cognitive TUG score of</=15.0"    in order to improve functional gait     Baseline  standard-18.20" Cognitive 36.91"    Time  2    Period  Months    Status  On-going    Target Date  12/31/17      PT LONG TERM GOAL #3   Title  Pt will demonstate an Functional Gait Assessment score  >/= 24/30 in order to furhter improve funtional gait     Time  2    Period  Months    Status  On-going    Target Date  12/31/17      PT LONG TERM GOAL #4   Title  pt dill demonstrate gait speed of >/=2.62 ft/sec safely in order to indicate a safe community level ambulator    Baseline  2.37f/s    Time  2    Period  Months    Status  On-going    Target Date  12/31/17      PT LONG TERM GOAL #5   Title  pt will negotiate ramps,curbs,grass, and short paved distance without device modified independent to indicate ability for community access.     Baseline  min assist on curbs, supervision on ramps     Time  2    Period  Months    Status  On-going    Target Date  12/31/17      PT LONG TERM GOAL #6   Title  Pt will ambulate >/= 1000' while maintaining light conversation with fluent gait without device modified independent to enbable community mobility.     Time  2    Period  Months    Status  On-going    Target Date  12/31/17         Plan - 11/24/17 1550    Clinical Impression Statement  Today's skilled session focused on checking current progress towards short term PT goals. Pt demonstrated ability to meet short term goals addressing gait distance with naming task, stepping over objects to decrease fall risk, and HEP independence. However, pt was not able to perform forward tandem walking for 15 steps. Pt would benefit from continued skilled PT interventions to reach the single unmet short  term PT goals and to start working towards long term PT goals.     History and Personal Factors relevant to plan of care:  basal ganglia, CVA, HTN, DM 2, peripheral neuropathy    Clinical Presentation due to:  high fall risk, lives alone    Rehab Potential  Good    PT Frequency  --    PT Duration  Other (comment)    PT Treatment/Interventions  ADLs/Self Care Home Management;Aquatic Therapy;Biofeedback;Cryotherapy;Electrical Stimulation;Moist Heat;Traction;Ultrasound;DME Instruction;Stair training;Functional mobility training;Therapeutic activities;Therapeutic exercise;Balance training;Neuromuscular re-education;Cognitive remediation;Patient/family education;Orthotic Fit/Training;Manual techniques;Passive range of motion;Taping    PT Next Visit Plan  Plan to continue progressing dynamic balance such as tandem walking forwards and backward, static balance on uneven surface, LE strengthening, and SLS single leg balance and dual/multi task activities.        Patient will benefit from skilled therapeutic intervention in order to improve the following deficits and impairments:  Abnormal gait, Decreased balance, Decreased mobility, Difficulty walking, Decreased cognition, Decreased activity tolerance, Decreased safety awareness, Decreased strength, Impaired UE functional use, Decreased endurance, Postural dysfunction  Visit Diagnosis: Muscle weakness (generalized)  Unsteadiness on feet  Spastic hemiplegia of right dominant side as late effect of  cerebrovascular disease, unspecified cerebrovascular disease type [facility])     Problem List Patient Active Problem List   Diagnosis Date Noted  . Left basal ganglia embolic stroke ([place]) 51/10/5850  . Facial droop   . Right sided weakness   . Diabetes mellitus ([place])   . Diastolic dysfunction   . Dysphagia, post-stroke   . Dysarthria, post-stroke   . Hypokalemia   . Acute blood loss anemia   . Hyperlipidemia   . Stroke ([place]) 07/16/2017  .  Hypertension 07/16/2017  . Arthritis of right hand 01/11/2017  . Osteoarthritis of right knee 01/06/2016  . Status post right partial knee replacement 01/06/2016    Carlena Sax 11/25/2017, 1:10 PM  [facility] [address] [place], [place], [address] Phone: [phone]   Fax:  [phone]  Name: Kristi David MRN: [id number] Date of Birth: 1940/10/25

## 2017-11-24 NOTE — Therapy (Signed)
[facility] [address] [place], [place], [address] Phone: [phone]   Fax:  [phone]  Occupational Therapy Treatment  Patient Details  Name: Kristi David MRN: [id number] Date of Birth: 1940-10-08 Referring Provider: Dr. Riley Kill   Encounter Date: 11/24/2017  OT End of Session - 11/24/17 1649    Visit Number  4    Number of Visits  16    Date for OT Re-Evaluation  12/27/17    Authorization Type  medicare    Authorization Time Period  60 days    OT Start Time  1531    OT Stop Time  1618    OT Time Calculation (min)  47 min    Activity Tolerance  --       Past Medical History:  Diagnosis Date  . Acute blood loss anemia   . Anemia   . Arthritis    "legs, knees" (01/07/2016)  . Arthritis of right hand 01/11/2017  . Diabetes mellitus (HCC)   . Diastolic dysfunction   . Dysarthria, post-stroke   . Dysphagia, post-stroke   . Facial droop   . GERD (gastroesophageal reflux disease)    no meds  . High cholesterol   . History of blood transfusion   . Hyperlipidemia   . Hypertension   . Hypokalemia   . Left basal ganglia embolic stroke (HCC) 07/18/2017  . Osteoarthritis of right knee 01/06/2016  . Seasonal allergies   . Shortness of breath dyspnea    if too active  . Stroke (HCC) 07/16/2017  . Syncope    passed out and was hospitalized for a couple of days  . Type II diabetes mellitus (HCC)    does not take medicine diet controlled    Past Surgical History:  Procedure Laterality Date  . BUNIONECTOMY Bilateral   . CATARACT EXTRACTION W/ INTRAOCULAR LENS  IMPLANT, BILATERAL Bilateral   . INGUINAL HERNIA REPAIR Right   . KNEE ARTHROSCOPY WITH EXCISION BAKER'S CYST Right   . MR LOWER LEG LEFT ([facility] HX) Left    after a break  . PARTIAL KNEE ARTHROPLASTY Right 01/06/2016   Procedure: RIGHT UNICOMPARTMENTAL KNEE ARTHROPLASTY;  Surgeon: Kathryne Hitch, MD;  Location: [facility] OR;  Service: Orthopedics;   Laterality: Right;  . VEIN LIGATION AND STRIPPING Bilateral     There were no vitals filed for this visit.  Subjective Assessment - 11/24/17 1535    Subjective   I didn't have any pain in my shoulder at all after last time.    Pertinent History  L BG CVA 07/16/2017.  Depression, HTN, HLD, DM, peripheral neuropathy.      Patient Stated Goals  to use my arm better                   OT Treatments/Exercises (OP) - 11/24/17 0001      Neurological Re-education Exercises   Other Exercises 1  Neuro re ed in supine first with closed chain bilateral mid reach progressing to overhead reach as well as addressing active ER.  Progressed to unilateral reach with RUE in supine to mid reach incorporating functional grasp and release of light weight object - pt demonstrates R inattention and requires mod cues to fully use the movement she has in RUE.  Transitioned into sitting and addressed bilateral closed chain low reach (min facilitation and cues to use arm to reach vs body) progressed to unilateral low reach for light weight object with RUE - pt needs min facilitation  and mod cues for full attention and effort to RUE.       Manual Therapy   Manual Therapy  Soft tissue mobilization;Joint mobilization;Scapular mobilization    Manual therapy comments  joint, soft tissue and scap mob of R shoulder girdle as pt had some pain with functional movement of RUE in supine at beginning of session.  Manual therapy to address pain, stiffness and improve alignment prior to neuro re ed - pain decreased to 0/10 for up to 105* of shoulder flexion in supine.                OT Short Term Goals - 11/24/17 1648      OT SHORT TERM GOAL #1   Title  Pt will be mod I with HEP for RUE - 11/29/2017    Status  On-going      OT SHORT TERM GOAL #2   Title  Pt will rate pain no greater than 3/10 for PROM for shoulder flexion to 100* in supine to allow for adequate ROM for self care activities.     Status   Achieved      OT SHORT TERM GOAL #3   Title  Pt will demonstrate ability to grasp cylindrical light weight object in R hand during light manipulation of item with L hand.     Status  On-going      OT SHORT TERM GOAL #4   Title  Pt will demonstrate ability for mod I for bathing at shower level    Status  Achieved        OT Long Term Goals - 11/24/17 1648      OT LONG TERM GOAL #1   Title  Pt will be mod I with upgraded HEP prn - 12/27/2017    Status  On-going      OT LONG TERM GOAL #2   Title  Pt will rate pain in L shoulder no greater than 3/10 with functional use of LUE as stabilizer/occassional gross assist    Status  On-going      OT LONG TERM GOAL #3   Title  Pt will demonstrate ability to use RUE as stabilizer at least 50% of the time with basic self care activities.     Status  On-going      OT LONG TERM GOAL #4   Title  Pt will demonstrate shoulder flexion for RUE to at least 50* in preparation for low reach    Status  On-going            Plan - 11/24/17 1648    Clinical Impression Statement  Pt progressing toward goals slowly. Pt with decreasing pain in R shoulder over all and slowly improving grasp and low reach.     Occupational Profile and client history currently impacting functional performance  PMH:  Depression, HTN, HLD, DM, peripheral neuropathy.      Occupational performance deficits (Please refer to evaluation for details):  ADL's;IADL's;Leisure;Social Participation    Rehab Potential  Fair    Current Impairments/barriers affecting progress:  severity of UE deficits, R inattention    OT Frequency  2x / week    OT Duration  8 weeks    OT Treatment/Interventions  Self-care/ADL training;Cryotherapy;Moist Heat;Electrical Stimulation;DME and/or AE instruction;Neuromuscular education;Therapeutic exercise;Functional Mobility Training;Manual Therapy;Passive range of motion;Splinting;Therapeutic activities;Cognitive remediation/compensation;Patient/family  education;Balance training    Plan  manual therapy, NMR for RUE/trunk, postural control and balance, functional mobility, intiate HEP as possible    Consulted and Agree with  Plan of Care  Patient       Patient will benefit from skilled therapeutic intervention in order to improve the following deficits and impairments:  Abnormal gait, Decreased activity tolerance, Decreased cognition, Decreased balance, Decreased knowledge of use of DME, Decreased range of motion, Decreased mobility, Decreased strength, Increased edema, Difficulty walking, Impaired UE functional use, Impaired tone, Impaired sensation, Pain  Visit Diagnosis: Muscle weakness (generalized)  Unsteadiness on feet  Spastic hemiplegia of right dominant side as late effect of cerebrovascular disease, unspecified cerebrovascular disease type (HCC)  Abnormal posture  Stiffness of right shoulder, not elsewhere classified  Stiffness of right hand, not elsewhere classified  Pain of right shoulder joint on movement  Pain in right hand  Other symptoms and signs involving cognitive functions following cerebral infarction    Problem List Patient Active Problem List   Diagnosis Date Noted  . Left basal ganglia embolic stroke (HCC) 07/18/2017  . Facial droop   . Right sided weakness   . Diabetes mellitus (HCC)   . Diastolic dysfunction   . Dysphagia, post-stroke   . Dysarthria, post-stroke   . Hypokalemia   . Acute blood loss anemia   . Hyperlipidemia   . Stroke (HCC) 07/16/2017  . Hypertension 07/16/2017  . Arthritis of right hand 01/11/2017  . Osteoarthritis of right knee 01/06/2016  . Status post right partial knee replacement 01/06/2016    Norton Pastel, OTR/L 11/24/2017, 4:51 PM  [facility] [address] [place], [place], [address] Phone: [phone]   Fax:  [phone]  Name: Marget Outten MRN: [id number] Date of Birth: January 24, 1941

## 2017-11-28 ENCOUNTER — Encounter: Payer: Self-pay | Admitting: Physical Therapy

## 2017-11-28 ENCOUNTER — Encounter: Payer: Self-pay | Admitting: Occupational Therapy

## 2017-11-28 ENCOUNTER — Ambulatory Visit: Payer: Medicare Other | Attending: Physical Medicine & Rehabilitation | Admitting: Occupational Therapy

## 2017-11-28 ENCOUNTER — Encounter: Payer: Medicare Other | Attending: Physical Medicine & Rehabilitation | Admitting: Physical Medicine & Rehabilitation

## 2017-11-28 ENCOUNTER — Ambulatory Visit: Payer: Medicare Other | Admitting: Physical Therapy

## 2017-11-28 ENCOUNTER — Encounter: Payer: Self-pay | Admitting: Physical Medicine & Rehabilitation

## 2017-11-28 VITALS — BP 145/77 | HR 62

## 2017-11-28 DIAGNOSIS — I639 Cerebral infarction, unspecified: Secondary | ICD-10-CM | POA: Insufficient documentation

## 2017-11-28 DIAGNOSIS — R293 Abnormal posture: Secondary | ICD-10-CM | POA: Insufficient documentation

## 2017-11-28 DIAGNOSIS — M1990 Unspecified osteoarthritis, unspecified site: Secondary | ICD-10-CM | POA: Diagnosis not present

## 2017-11-28 DIAGNOSIS — Z9889 Other specified postprocedural states: Secondary | ICD-10-CM | POA: Diagnosis not present

## 2017-11-28 DIAGNOSIS — Z8249 Family history of ischemic heart disease and other diseases of the circulatory system: Secondary | ICD-10-CM | POA: Insufficient documentation

## 2017-11-28 DIAGNOSIS — R1310 Dysphagia, unspecified: Secondary | ICD-10-CM | POA: Diagnosis not present

## 2017-11-28 DIAGNOSIS — E1151 Type 2 diabetes mellitus with diabetic peripheral angiopathy without gangrene: Secondary | ICD-10-CM | POA: Diagnosis not present

## 2017-11-28 DIAGNOSIS — R2681 Unsteadiness on feet: Secondary | ICD-10-CM | POA: Diagnosis not present

## 2017-11-28 DIAGNOSIS — M6281 Muscle weakness (generalized): Secondary | ICD-10-CM

## 2017-11-28 DIAGNOSIS — Z9841 Cataract extraction status, right eye: Secondary | ICD-10-CM | POA: Diagnosis not present

## 2017-11-28 DIAGNOSIS — K589 Irritable bowel syndrome without diarrhea: Secondary | ICD-10-CM | POA: Diagnosis not present

## 2017-11-28 DIAGNOSIS — I69351 Hemiplegia and hemiparesis following cerebral infarction affecting right dominant side: Secondary | ICD-10-CM | POA: Insufficient documentation

## 2017-11-28 DIAGNOSIS — M25641 Stiffness of right hand, not elsewhere classified: Secondary | ICD-10-CM | POA: Diagnosis not present

## 2017-11-28 DIAGNOSIS — R2689 Other abnormalities of gait and mobility: Secondary | ICD-10-CM | POA: Insufficient documentation

## 2017-11-28 DIAGNOSIS — K219 Gastro-esophageal reflux disease without esophagitis: Secondary | ICD-10-CM | POA: Insufficient documentation

## 2017-11-28 DIAGNOSIS — I10 Essential (primary) hypertension: Secondary | ICD-10-CM | POA: Insufficient documentation

## 2017-11-28 DIAGNOSIS — M79641 Pain in right hand: Secondary | ICD-10-CM | POA: Diagnosis not present

## 2017-11-28 DIAGNOSIS — R531 Weakness: Secondary | ICD-10-CM

## 2017-11-28 DIAGNOSIS — Z9842 Cataract extraction status, left eye: Secondary | ICD-10-CM | POA: Insufficient documentation

## 2017-11-28 DIAGNOSIS — M25511 Pain in right shoulder: Secondary | ICD-10-CM

## 2017-11-28 DIAGNOSIS — Z96651 Presence of right artificial knee joint: Secondary | ICD-10-CM | POA: Insufficient documentation

## 2017-11-28 DIAGNOSIS — I69318 Other symptoms and signs involving cognitive functions following cerebral infarction: Secondary | ICD-10-CM | POA: Insufficient documentation

## 2017-11-28 DIAGNOSIS — Z79899 Other long term (current) drug therapy: Secondary | ICD-10-CM | POA: Insufficient documentation

## 2017-11-28 DIAGNOSIS — M7501 Adhesive capsulitis of right shoulder: Secondary | ICD-10-CM | POA: Diagnosis not present

## 2017-11-28 DIAGNOSIS — I69951 Hemiplegia and hemiparesis following unspecified cerebrovascular disease affecting right dominant side: Secondary | ICD-10-CM | POA: Diagnosis not present

## 2017-11-28 DIAGNOSIS — M25611 Stiffness of right shoulder, not elsewhere classified: Secondary | ICD-10-CM | POA: Insufficient documentation

## 2017-11-28 DIAGNOSIS — Z823 Family history of stroke: Secondary | ICD-10-CM | POA: Insufficient documentation

## 2017-11-28 DIAGNOSIS — E1142 Type 2 diabetes mellitus with diabetic polyneuropathy: Secondary | ICD-10-CM | POA: Insufficient documentation

## 2017-11-28 DIAGNOSIS — E785 Hyperlipidemia, unspecified: Secondary | ICD-10-CM | POA: Insufficient documentation

## 2017-11-28 DIAGNOSIS — I69322 Dysarthria following cerebral infarction: Secondary | ICD-10-CM | POA: Insufficient documentation

## 2017-11-28 NOTE — Progress Notes (Signed)
Subjective:    Patient ID: Kristi David, female    DOB: [birth year], 77 y.o.   MRN: [id number]  HPI  Kristi David is here in follow up of her left basal ganglia infarct and associated right hemiparesis.  She has begun her outpatient therapies and has noted improvement in the use of her right arm as well as her balance.  She states that she is having some pain in her right shoulder.  She is used Tylenol but not consistently.  She uses her Voltaren gel as well but again not consistently.  She asks if she can have something stronger for pain.  Occupational therapy is working on her right shoulder range of motion and pain.  Additionally her doctor put her on Vesicare for overactive bladder.  She is only using it as needed before she "goes out".  She wants a stronger dose.  She has not seen a urologist here in the [place] area but had apparently has seen someone before.  She asked me if her Lipitor could be causing abdominal pain or constipation.  She asked me many of her other medications could be potentially causing that.  She does take hyoscyamine for IBS.  Pain Inventory Average Pain 0 Pain Right Now 0 My pain is na  In the last 24 hours, has pain interfered with the following? General activity 0 Relation with others 0 Enjoyment of life 0 What TIME of day is your pain at its worst? na Sleep (in general) Fair  Pain is worse with: na Pain improves with: na Relief from Meds: na  Mobility use a cane use a walker ability to climb steps?  yes do you drive?  no  Function retired I need assistance with the following:  dressing, bathing and household duties  Neuro/Psych bladder control problems trouble walking  Prior Studies Any changes since last visit?  no  Physicians involved in your care Any changes since last visit?  no   Family History  Problem Relation Age of Onset  . Stroke Mother   . CAD Father    Social History   Socioeconomic History  . Marital status:  Divorced    Spouse name: None  . Number of children: None  . Years of education: None  . Highest education level: None  Social Needs  . Financial resource strain: None  . Food insecurity - worry: None  . Food insecurity - inability: None  . Transportation needs - medical: None  . Transportation needs - non-medical: None  Occupational History  . None  Tobacco Use  . Smoking status: Never Smoker  . Smokeless tobacco: Never Used  Substance and Sexual Activity  . Alcohol use: No  . Drug use: No  . Sexual activity: None  Other Topics Concern  . None  Social History Narrative  . None   Past Surgical History:  Procedure Laterality Date  . BUNIONECTOMY Bilateral   . CATARACT EXTRACTION W/ INTRAOCULAR LENS  IMPLANT, BILATERAL Bilateral   . INGUINAL HERNIA REPAIR Right   . KNEE ARTHROSCOPY WITH EXCISION BAKER'S CYST Right   . MR LOWER LEG LEFT ([facility] HX) Left    after a break  . PARTIAL KNEE ARTHROPLASTY Right 01/06/2016   Procedure: RIGHT UNICOMPARTMENTAL KNEE ARTHROPLASTY;  Surgeon: Kathryne Hitch, MD;  Location: [facility] OR;  Service: Orthopedics;  Laterality: Right;  . VEIN LIGATION AND STRIPPING Bilateral    Past Medical History:  Diagnosis Date  . Acute blood loss anemia   . Anemia   .  Arthritis    "legs, knees" (01/07/2016)  . Arthritis of right hand 01/11/2017  . Diabetes mellitus (HCC)   . Diastolic dysfunction   . Dysarthria, post-stroke   . Dysphagia, post-stroke   . Facial droop   . GERD (gastroesophageal reflux disease)    no meds  . High cholesterol   . History of blood transfusion   . Hyperlipidemia   . Hypertension   . Hypokalemia   . Left basal ganglia embolic stroke (HCC) 07/18/2017  . Osteoarthritis of right knee 01/06/2016  . Seasonal allergies   . Shortness of breath dyspnea    if too active  . Stroke (HCC) 07/16/2017  . Syncope    passed out and was hospitalized for a couple of days  . Type II diabetes mellitus (HCC)    does not take medicine  diet controlled   BP (!) 145/77   Pulse 62   SpO2 97%   Opioid Risk Score:   Fall Risk Score:  `1  Depression screen PHQ 2/9  [facility] 08/30/2017 08/30/2017  Decreased Interest 0 1  Down, Depressed, Hopeless 0 1  PHQ - 2 Score 0 2  Altered sleeping 1 -  Tired, decreased energy 1 -  Change in appetite 0 -  Feeling bad or failure about yourself  0 -  Trouble concentrating 0 -  Moving slowly or fidgety/restless 1 -  Suicidal thoughts 0 -  PHQ-9 Score 3 -  Difficult doing work/chores Not difficult at all -     Review of Systems  Constitutional: Negative.   HENT: Negative.   Eyes: Negative.   Respiratory: Negative.   Cardiovascular: Negative.   Gastrointestinal: Positive for abdominal pain and constipation.  Endocrine: Negative.   Genitourinary: Negative.   Musculoskeletal: Negative.   Skin: Negative.   Allergic/Immunologic: Negative.   Neurological: Negative.   Hematological: Negative.   Psychiatric/Behavioral: Negative.   All other systems reviewed and are negative.      Objective:   Physical Exam  Head: Normocephalic and atraumatic.  Eyes: EOMI.  Neck: Normal range of motion. Neck supple. No thyromegaly present.  Cardiovascular: RRR without murmur. No JVD  Respiratory: clear with normal effort GI: BS +, non-tender, non-distended .  Musculoskeletal: right shoulder pain with abduction and ER/IR. Limited to about 75 degrees of ABD.  Neurological: She is alert.  Speech dysarthric, voice soft. right central 7 and tongue deviation remains Motor: LUE/LLE: 5/5 proximal to distal RUE: Shoulder abduction  2+/5 proximal to distal, elbow flex/ext 2+/5, grip 2+ /5  RLE: 4/5 HF, KE and 4/5 ADF/PF.   gait improving with better leg lift and swing. Still doesn't use right arm much to assist balance with gait.  Skin: Skin is warm and dry.  Psychiatric: affect flat. Evasive at times.        Assessment & Plan:  Medical Problem List and Plan: 1.  Right sided weakness and dysarthria secondary to left basal ganglia infarction secondary to small vessel disease -continue with outpt therapies.   -Stressed the importance of regular stretching particularly at right shoulder 2.  Pain Management/adhesive capsultis right shoulder.   - Voltaren gel as needed, horizant,    -scheduled tylenol for right adhesive capsulitis   -Again, I told her the key would be to work on range of motion and therapy as well as daily at home.   -consider injection. 3. IBS: hyoscyamine refilled 4. Mood: Celexa 40 mg daily 5. Overactive bladder: recommend vesicare daily. Myrbetriq? Urology visit? 6.  Dysphagia. Now on regular diet 7. Hypertension. Cozaar 100 mg daily, Toprol 25 mg daily -Blood pressure under improved control 8. Hyperlipidemia. Lipitor 9. Diabetes mellitus with peripheral neuropathy. diet controlled   -recommended occasional cbg checks at home  15 minutes of face to face patient care time were spent during this visit. All questions were encouraged and answered.  Follow up in 2 months.

## 2017-11-28 NOTE — Therapy (Signed)
[facility] [address] [place], [place], [address] Phone: [phone]   Fax:  [phone]  Physical Therapy Treatment  Patient Details  Name: Kristi David MRN: [id number] Date of Birth: Mar 11, 1941 Referring Provider: Dr. Naaman Plummer   Encounter Date: 11/28/2017  PT End of Session - 11/28/17 1329    Visit Number  7    Number of Visits  18    Date for PT Re-Evaluation  12/30/17    Authorization Type  Medicare     Authorization Time Period  11/01/2017----12/30/2017    PT Start Time  1230    PT Stop Time  1315    PT Time Calculation (min)  45 min    Equipment Utilized During Treatment  Gait belt    Activity Tolerance  Patient tolerated treatment well    Behavior During Therapy  [facility] for tasks assessed/performed       Past Medical History:  Diagnosis Date  . Acute blood loss anemia   . Anemia   . Arthritis    "legs, knees" (01/07/2016)  . Arthritis of right hand 01/11/2017  . Diabetes mellitus ([place])   . Diastolic dysfunction   . Dysarthria, post-stroke   . Dysphagia, post-stroke   . Facial droop   . GERD (gastroesophageal reflux disease)    no meds  . High cholesterol   . History of blood transfusion   . Hyperlipidemia   . Hypertension   . Hypokalemia   . Left basal ganglia embolic stroke ([place]) 66/02/3015  . Osteoarthritis of right knee 01/06/2016  . Seasonal allergies   . Shortness of breath dyspnea    if too active  . Stroke ([place]) 07/16/2017  . Syncope    passed out and was hospitalized for a couple of days  . Type II diabetes mellitus ([place])    does not take medicine diet controlled    Past Surgical History:  Procedure Laterality Date  . BUNIONECTOMY Bilateral   . CATARACT EXTRACTION W/ INTRAOCULAR LENS  IMPLANT, BILATERAL Bilateral   . INGUINAL HERNIA REPAIR Right   . KNEE ARTHROSCOPY WITH EXCISION BAKER'S CYST Right   . MR LOWER LEG LEFT ([place] HX) Left    after a break  . PARTIAL KNEE ARTHROPLASTY  Right 01/06/2016   Procedure: RIGHT UNICOMPARTMENTAL KNEE ARTHROPLASTY;  Surgeon: Mcarthur Rossetti, MD;  Location: [facility];  Service: Orthopedics;  Laterality: Right;  . VEIN LIGATION AND STRIPPING Bilateral     There were no vitals filed for this visit.  Subjective Assessment - 11/28/17 1229    Subjective  No falls. She has been trying to increase her activity level.     Patient is accompained by:  Family member    Limitations  Walking;House hold activities    Patient Stated Goals  pt wants to use R hand more efficently and walk without use of AD in community     Currently in Pain?  No/denies                      [facility] Adult PT Treatment/Exercise - 11/28/17 1230      Ambulation/Gait   Ambulation/Gait  Yes    Ambulation/Gait Assistance  5: Supervision    Ambulation/Gait Assistance Details  scanning then tossing light ball up BUEs ambulating    Ambulation Distance (Feet)  600 Feet    Assistive device  None    Ambulation Surface  Indoor;Level    Stairs  Yes    Stairs Assistance  5: Supervision    Stairs Assistance Details (indicate cue type and reason)  demo & verbal cues on weight shift & LE movements    Stair Management Technique  One rail Left;Alternating pattern;Forwards    Number of Stairs  4 3 reps for 12 steps total    Height of Stairs  6      Knee/Hip Exercises: Aerobic   Stepper  5 minutes          Balance Exercises - 11/28/17 1230      Balance Exercises: Standing   Tandem Stance  Eyes open;Foam/compliant surface;30 secs switching front LE    Stepping Strategy  Anterior;Posterior;Foam/compliant surface;5 reps reaching with RUE & with LUE    Rockerboard  Anterior/posterior;Lateral;Head turns;EO;10 reps tilting board recovery     Step Ups  6 inch;Intermittent UE support;Forward;Lateral 5 reps leading with each LE    Balance Beam  blue soft foam beam crossways stance: EC 10 sec 5 reps,  head turns right/left & up/down    Retro Gait  Foam/compliant  surface;Other (comment) min guard /tactile cues for balance recovery    Sidestepping  Foam/compliant support Min guard/tactile cues for balance recovery    Marching Limitations  foam compliant surface with min guard /tactile cues for balance reactions    Heel Raises Limitations  foam compliant surface with min guard /tactile cues for balance reactions    Toe Raise Limitations  foam compliant surface with min guard /tactile cues for balance reactions    Sit to Stand Time  no UE support 5 reps          PT Short Term Goals - 11/24/17 1540      PT SHORT TERM GOAL #1   Title  Pt will deomonstate independence with initial HEP (All STGs Target Date 11/29/2017)    Baseline  11/24/17- Pt verbally expressed confidence with HEP independence and initial HEP.     Status  Achieved    Target Date  11/29/17      PT SHORT TERM GOAL #2   Title  pt will demonstrate tandem walking 15steps min guard     Baseline  11/24/17: 9 steps with min guard, single UE assistance needed afterwards.    Status  Partially Met    Target Date  11/29/17      PT SHORT TERM GOAL #3   Title  Pt will demonstrate stepping over objects 6-8" tall without AD with supervision  in order to reduce falls risk     Baseline  11/24/17: Pt demonstrated ability to step over objects 6-8" without AD with supervision in order to reduce falls risk.    Status  Achieved    Target Date  11/29/17      PT SHORT TERM GOAL #4   Title  pt will ambulate 600' without AD performing naming task with continual gait with supervision.     Baseline  11/24/17: Pt able to ambulate 650' without AD performing naming task with continual gait with supervision.     Status  Achieved    Target Date  11/29/17      PT SHORT TERM GOAL #5   Title  pain in right knee with daily activities decreased >/= 75%    Baseline  11/24/2017: Pt verbally expressed pain in right knee with daily activities decreased to >/= 25%    Status  Achieved        PT Long Term Goals -  11/16/17 1625      PT LONG TERM  GOAL #1   Title  pt will demonstrate independence in ongoing HEP/ Fitness program in order to continue to maintain improvement made in PT (All LTGs Target Date 12/31/2017)    Time  2    Period  Months    Status  On-going    Target Date  12/31/17      PT LONG TERM GOAL #2   Title  Pt will demonstate a standard Timed Up-Go score of </=14.0" and a cognitive TUG score of</=15.0"    in order to improve functional gait     Baseline  standard-18.20" Cognitive 36.91"    Time  2    Period  Months    Status  On-going    Target Date  12/31/17      PT LONG TERM GOAL #3   Title  Pt will demonstate an Functional Gait Assessment score  >/= 24/30 in order to furhter improve funtional gait     Time  2    Period  Months    Status  On-going    Target Date  12/31/17      PT LONG TERM GOAL #4   Title  pt dill demonstrate gait speed of >/=2.62 ft/sec safely in order to indicate a safe community level ambulator    Baseline  2.92f/s    Time  2    Period  Months    Status  On-going    Target Date  12/31/17      PT LONG TERM GOAL #5   Title  pt will negotiate ramps,curbs,grass, and short paved distance without device modified independent to indicate ability for community access.     Baseline  min assist on curbs, supervision on ramps     Time  2    Period  Months    Status  On-going    Target Date  12/31/17      PT LONG TERM GOAL #6   Title  Pt will ambulate >/= 1000' while maintaining light conversation with fluent gait without device modified independent to enbable community mobility.     Time  2    Period  Months    Status  On-going    Target Date  12/31/17            Plan - 11/28/17 1331    Clinical Impression Statement  Patient improved ankle strategy & hip strategy for balance recovery. Her step strategy is delayed. Patient's RLE appears to have improved functional strength.     Rehab Potential  Good    PT Duration  Other (comment)    PT  Treatment/Interventions  ADLs/Self Care Home Management;Aquatic Therapy;Biofeedback;Cryotherapy;Electrical Stimulation;Moist Heat;Traction;Ultrasound;DME Instruction;Stair training;Functional mobility training;Therapeutic activities;Therapeutic exercise;Balance training;Neuromuscular re-education;Cognitive remediation;Patient/family education;Orthotic Fit/Training;Manual techniques;Passive range of motion;Taping    PT Next Visit Plan  Plan to continue progressing dynamic balance such as tandem walking forwards and backward, static balance on uneven surface, LE strengthening, and SLS single leg balance and dual/multi task activities.     Consulted and Agree with Plan of Care  Patient       Patient will benefit from skilled therapeutic intervention in order to improve the following deficits and impairments:  Abnormal gait, Decreased balance, Decreased mobility, Difficulty walking, Decreased cognition, Decreased activity tolerance, Decreased safety awareness, Decreased strength, Impaired UE functional use, Decreased endurance, Postural dysfunction  Visit Diagnosis: Muscle weakness (generalized)  Unsteadiness on feet  Abnormal posture  Other abnormalities of gait and mobility  Hemiplegia and hemiparesis following cerebral infarction affecting right dominant side (  [facility])     Problem List Patient Active Problem List   Diagnosis Date Noted  . Adhesive capsulitis of right shoulder 11/28/2017  . Left basal ganglia embolic stroke ([place]) 87/18/3672  . Facial droop   . Right sided weakness   . Diabetes mellitus ([place])   . Diastolic dysfunction   . Dysphagia, post-stroke   . Dysarthria, post-stroke   . Hypokalemia   . Acute blood loss anemia   . Hyperlipidemia   . Stroke ([place]) 07/16/2017  . Hypertension 07/16/2017  . Arthritis of right hand 01/11/2017  . Osteoarthritis of right knee 01/06/2016  . Status post right partial knee replacement 01/06/2016    Rafe Mackowski PT, DPT 11/28/2017, 1:40  PM  [facility] [address] [place], [place], [address] Phone: [phone]   Fax:  [phone]  Name: Taylore Hinde MRN: [id number] Date of Birth: 07-27-1941

## 2017-11-28 NOTE — Patient Instructions (Signed)
Home Exercise program for your right arm:  Do these every day TWO times per day. This means by the end of the day you should have done 40 of each!!   1. Sit on a firm chair that isn't too low. Keep your feet flat on the floor.  Hold your cane in your right hand with the tip of the cane next to your right foot. Slowly reach the cane out in front of you focusing on straightening the elbow all the way. Then bend your elbow and bring the cane back toward you. Do 10, rest then do 10 more.  2. Same starting position as #1.  This time move the cane from left to right.  GO SLOW do not rush it.  Keep control of the arm while you do this. Do 10, rest then do 10 more.  Do these for now we will be adding some activities.

## 2017-11-28 NOTE — Patient Instructions (Addendum)
1. TAKE VESICARE EACH NIGHT TO MAXIMIZE BENEFITS. IF STILL NOT HELPING, YOU CAN LOOK AT OTHER OPTIONS.  (MYRBETRIQ) -CONSIDER UROLOGY VISIT  2. CHECK YOUR SUGARS ONCE AND A WHILE TO MAKE SURE THEY'RE STAYING IN A GOOD RANGE  3. INCREASE YOUR FLUID AND VEGETABLE/FRUIT INTAKE  4. CONTINUE TO WORK ON YOUR RANGE OF MOTION AT YOUR RIGHT SHOULDER EACH AND EVERY DAY. YOU MAY APPLY HEAT TO YOUR SHOULDER TO HELP LOOSEN YOUR SHOULDER BEFORE STRETCHING.  5. YOU CAN TAKE TYLENOL 500MG  EVERY 6 HOURS FOR PAIN. USE THE VOLTAREN GEL THREE X DAILY.

## 2017-11-29 NOTE — Therapy (Signed)
[facility] [address] [place], [place], [address] Phone: [phone]   Fax:  [phone]  Occupational Therapy Treatment  Patient Details  Name: Kristi David MRN: [id number] Date of Birth: 1941/03/13 Referring Provider: Dr. Riley Kill   Encounter Date: 11/28/2017  OT End of Session - 11/28/17 1326    Visit Number  5    Number of Visits  16    Date for OT Re-Evaluation  12/27/17    Authorization Type  medicare    Authorization Time Period  60 days    OT Start Time  1145    OT Stop Time  1229    OT Time Calculation (min)  44 min    Activity Tolerance  Patient tolerated treatment well       Past Medical History:  Diagnosis Date  . Acute blood loss anemia   . Anemia   . Arthritis    "legs, knees" (01/07/2016)  . Arthritis of right hand 01/11/2017  . Diabetes mellitus ([facility])   . Diastolic dysfunction   . Dysarthria, post-stroke   . Dysphagia, post-stroke   . Facial droop   . GERD (gastroesophageal reflux disease)    no meds  . High cholesterol   . History of blood transfusion   . Hyperlipidemia   . Hypertension   . Hypokalemia   . Left basal ganglia embolic stroke ([facility]) 07/18/2017  . Osteoarthritis of right knee 01/06/2016  . Seasonal allergies   . Shortness of breath dyspnea    if too active  . Stroke ([facility]) 07/16/2017  . Syncope    passed out and was hospitalized for a couple of days  . Type II diabetes mellitus ([facility])    does not take medicine diet controlled    Past Surgical History:  Procedure Laterality Date  . BUNIONECTOMY Bilateral   . CATARACT EXTRACTION W/ INTRAOCULAR LENS  IMPLANT, BILATERAL Bilateral   . INGUINAL HERNIA REPAIR Right   . KNEE ARTHROSCOPY WITH EXCISION BAKER'S CYST Right   . MR LOWER LEG LEFT ([facility] HX) Left    after a break  . PARTIAL KNEE ARTHROPLASTY Right 01/06/2016   Procedure: RIGHT UNICOMPARTMENTAL KNEE ARTHROPLASTY;  Surgeon: Kathryne Hitch, MD;  Location: [facility] OR;   Service: Orthopedics;  Laterality: Right;  . VEIN LIGATION AND STRIPPING Bilateral     There were no vitals filed for this visit.  Subjective Assessment - 11/28/17 1148    Subjective   I saw Dr. Riley Kill today    Pertinent History  L BG CVA 07/16/2017.  Depression, HTN, HLD, DM, peripheral neuropathy.      Patient Stated Goals  to use my arm better    Currently in Pain?  No/denies                   OT Treatments/Exercises (OP) - 11/28/17 0001      Neurological Re-education Exercises   Other Exercises 1  Neuro re ed in sidelying in closed chain on moveable surface to address mid reach pattern with mod facilitation at scapula and cues and assist for full elbow extension. Worked toward beginning high reach. Transitioned to supine and addressed bilateral closed chain for mid reach - pt needs max cues and min faciltation.  Progressed to open chain reaching activity with RUE incoprorating grasp and release.  Transitioned into sitting and instructed pt in HEP for RUE using cane at home. See pt instruction for details. Pt able to return demonstrate all activities.  Manual Therapy   Manual Therapy  Joint mobilization;Soft tissue mobilization;Scapular mobilization    Manual therapy comments  joint, soft tissue and scap mob to R shoulder girdle to improve alignment, improve ROM, decrease tightness and decrease pain above 90* of shoulder flexion.  Addressed in sidelying and pt tolerated well. Also addressed ROM/tightness in R hand to improve ability for grip            OT Education - 11/28/17 1324    Education provided  Yes    Education Details  HEP for [employer]) Educated  Patient    Methods  Explanation;Demonstration;Verbal cues;Handout    Comprehension  Verbalized understanding;Returned demonstration       OT Short Term Goals - 11/28/17 1324      OT SHORT TERM GOAL #1   Title  Pt will be mod I with HEP for RUE - 11/29/2017    Status  On-going      OT SHORT TERM  GOAL #2   Title  Pt will rate pain no greater than 3/10 for PROM for shoulder flexion to 100* in supine to allow for adequate ROM for self care activities.     Status  Achieved      OT SHORT TERM GOAL #3   Title  Pt will demonstrate ability to grasp cylindrical light weight object in R hand during light manipulation of item with L hand.     Status  Achieved      OT SHORT TERM GOAL #4   Title  Pt will demonstrate ability for mod I for bathing at shower level    Status  Achieved        OT Long Term Goals - 11/28/17 1325      OT LONG TERM GOAL #1   Title  Pt will be mod I with upgraded HEP prn - 12/27/2017    Status  On-going      OT LONG TERM GOAL #2   Title  Pt will rate pain in L shoulder no greater than 3/10 with functional use of LUE as stabilizer/occassional gross assist    Status  On-going      OT LONG TERM GOAL #3   Title  Pt will demonstrate ability to use RUE as stabilizer at least 50% of the time with basic self care activities.     Status  On-going      OT LONG TERM GOAL #4   Title  Pt will demonstrate shoulder flexion for RUE to at least 50* in preparation for low reach    Status  On-going            Plan - 11/28/17 1325    Clinical Impression Statement  Pt with slow progress toward goals. Pt's progress impacted by decreased sensation, R inattention, decreased motor planning.      Rehab Potential  Fair    Current Impairments/barriers affecting progress:  severity of UE deficits, R inattention    OT Frequency  2x / week    OT Duration  8 weeks    OT Treatment/Interventions  Self-care/ADL training;Cryotherapy;Moist Heat;Electrical Stimulation;DME and/or AE instruction;Neuromuscular education;Therapeutic exercise;Functional Mobility Training;Manual Therapy;Passive range of motion;Splinting;Therapeutic activities;Cognitive remediation/compensation;Patient/family education;Balance training    Plan  manual therapy, NMR for RUE/trunk, postural control and balance,  functional mobility, add to HEP as possible    Consulted and Agree with Plan of Care  Patient       Patient will benefit from skilled therapeutic intervention in order to improve  the following deficits and impairments:  Abnormal gait, Decreased activity tolerance, Decreased cognition, Decreased balance, Decreased knowledge of use of DME, Decreased range of motion, Decreased mobility, Decreased strength, Increased edema, Difficulty walking, Impaired UE functional use, Impaired tone, Impaired sensation, Pain  Visit Diagnosis: Muscle weakness (generalized)  Unsteadiness on feet  Spastic hemiplegia of right dominant side as late effect of cerebrovascular disease, unspecified cerebrovascular disease type (HCC)  Abnormal posture  Stiffness of right shoulder, not elsewhere classified  Stiffness of right hand, not elsewhere classified  Pain of right shoulder joint on movement  Pain in right hand  Other symptoms and signs involving cognitive functions following cerebral infarction    Problem List Patient Active Problem List   Diagnosis Date Noted  . Adhesive capsulitis of right shoulder 11/28/2017  . Left basal ganglia embolic stroke ([facility]) 07/18/2017  . Facial droop   . Right sided weakness   . Diabetes mellitus ([facility])   . Diastolic dysfunction   . Dysphagia, post-stroke   . Dysarthria, post-stroke   . Hypokalemia   . Acute blood loss anemia   . Hyperlipidemia   . Stroke ([facility]) 07/16/2017  . Hypertension 07/16/2017  . Arthritis of right hand 01/11/2017  . Osteoarthritis of right knee 01/06/2016  . Status post right partial knee replacement 01/06/2016    Norton [place], Nehemiah Montee Halliday, OTR/L 11/29/2017, 7:51 AM  [facility] [address] [place], [place], [address] Phone: [phone]   Fax:  [phone]  Name: Jossie NgHettie Hipple MRN: [id number] Date of Birth: 10/30/1940

## 2017-12-01 ENCOUNTER — Ambulatory Visit: Payer: Medicare Other | Admitting: Occupational Therapy

## 2017-12-01 ENCOUNTER — Encounter: Payer: Self-pay | Admitting: Physical Therapy

## 2017-12-01 ENCOUNTER — Ambulatory Visit: Payer: Medicare Other | Admitting: Physical Therapy

## 2017-12-01 DIAGNOSIS — I69351 Hemiplegia and hemiparesis following cerebral infarction affecting right dominant side: Secondary | ICD-10-CM

## 2017-12-01 DIAGNOSIS — M6281 Muscle weakness (generalized): Secondary | ICD-10-CM | POA: Diagnosis not present

## 2017-12-01 DIAGNOSIS — I69951 Hemiplegia and hemiparesis following unspecified cerebrovascular disease affecting right dominant side: Secondary | ICD-10-CM

## 2017-12-01 DIAGNOSIS — R293 Abnormal posture: Secondary | ICD-10-CM | POA: Diagnosis not present

## 2017-12-01 DIAGNOSIS — R2681 Unsteadiness on feet: Secondary | ICD-10-CM | POA: Diagnosis not present

## 2017-12-01 DIAGNOSIS — M25641 Stiffness of right hand, not elsewhere classified: Secondary | ICD-10-CM

## 2017-12-01 DIAGNOSIS — M25611 Stiffness of right shoulder, not elsewhere classified: Secondary | ICD-10-CM

## 2017-12-01 NOTE — Therapy (Signed)
[facility] [address] [place], [place], [address] Phone: [phone]   Fax:  [phone]  Physical Therapy Treatment  Patient Details  Name: Kristi David MRN: [id number] Date of Birth: 1941/07/05 Referring Provider: Dr. Naaman Plummer   Encounter Date: 12/01/2017  PT End of Session - 12/01/17 1558    Visit Number  8    Number of Visits  18    Date for PT Re-Evaluation  12/30/17    Authorization Type  Medicare     Authorization Time Period  11/01/2017----12/30/2017    PT Start Time  1446    PT Stop Time  1533    PT Time Calculation (min)  47 min    Equipment Utilized During Treatment  Gait belt    Activity Tolerance  Patient tolerated treatment well    Behavior During Therapy  [facility] for tasks assessed/performed       Past Medical History:  Diagnosis Date  . Acute blood loss anemia   . Anemia   . Arthritis    "legs, knees" (01/07/2016)  . Arthritis of right hand 01/11/2017  . Diabetes mellitus ([place])   . Diastolic dysfunction   . Dysarthria, post-stroke   . Dysphagia, post-stroke   . Facial droop   . GERD (gastroesophageal reflux disease)    no meds  . High cholesterol   . History of blood transfusion   . Hyperlipidemia   . Hypertension   . Hypokalemia   . Left basal ganglia embolic stroke ([place]) 87/56/4332  . Osteoarthritis of right knee 01/06/2016  . Seasonal allergies   . Shortness of breath dyspnea    if too active  . Stroke ([place]) 07/16/2017  . Syncope    passed out and was hospitalized for a couple of days  . Type II diabetes mellitus ([place])    does not take medicine diet controlled    Past Surgical History:  Procedure Laterality Date  . BUNIONECTOMY Bilateral   . CATARACT EXTRACTION W/ INTRAOCULAR LENS  IMPLANT, BILATERAL Bilateral   . INGUINAL HERNIA REPAIR Right   . KNEE ARTHROSCOPY WITH EXCISION BAKER'S CYST Right   . MR LOWER LEG LEFT ([place] HX) Left    after a break  . PARTIAL KNEE ARTHROPLASTY  Right 01/06/2016   Procedure: RIGHT UNICOMPARTMENTAL KNEE ARTHROPLASTY;  Surgeon: Mcarthur Rossetti, MD;  Location: [facility];  Service: Orthopedics;  Laterality: Right;  . VEIN LIGATION AND STRIPPING Bilateral     There were no vitals filed for this visit.  Subjective Assessment - 12/01/17 1446    Subjective  Pt reported no falls, no changes in medication, and no pain today.    Patient is accompained by:  Family member    Limitations  Walking;House hold activities    Patient Stated Goals  pt wants to use R hand more efficently and walk without use of AD in community        [facility] Adult PT Treatment/Exercise - 12/01/17 1540      Ambulation/Gait   Ambulation/Gait  Yes    Ambulation/Gait Assistance  5: Supervision    Ambulation/Gait Assistance Details  ball tossing then scanning with verbal critical thinkning task    Ambulation Distance (Feet)  700 Feet    Assistive device  None    Ambulation Surface  Indoor;Level    Gait Comments  Pt ambulated without use of an AD. Pt performed small ball toss up while verbally critically thinking for dual task       High  Level Balance   High Level Balance Activities  Marching forwards;Marching backwards;Tandem walking ;Forward and backwards Tandem walking    High Level Balance Comments  Used red mat for uneven surface for all activities. Pt able to perform task safely without any loss of balance. Required initial demo and verbal cues for task performance. For marching tasks, verbal cues required during entire task for higher hip and knee flexion of leg performing the march (tendency for a low march or a SLR a couple of times.)       Neuro Re-ed    Neuro Re-ed Details   Performed Star drill with 5 targets bilaterally x 2 reps each. Also ball toss to wall and catch, with one foot on floor and contralateral foot on small foam block x 10 catches with each foot placed on foam block. SPTA provided supervision assistance via gait belt. Pt able to perform both  task by using stepping strategy and proper weight shifting.      Knee/Hip Exercises: Aerobic   Stepper  4 minutes          Balance Exercises - 12/01/17 1601      Balance Exercises: Standing   Rockerboard  Anterior/posterior;Lateral;EO;10 reps;EC;Intermittent UE support Using large rockerboard         PT Short Term Goals - 11/24/17 1540      PT SHORT TERM GOAL #1   Title  Pt will deomonstate independence with initial HEP (All STGs Target Date 11/29/2017)    Baseline  11/24/17- Pt verbally expressed confidence with HEP independence and initial HEP.     Status  Achieved    Target Date  11/29/17      PT SHORT TERM GOAL #2   Title  pt will demonstrate tandem walking 15steps min guard     Baseline  11/24/17: 9 steps with min guard, single UE assistance needed afterwards.    Status  Partially Met    Target Date  11/29/17      PT SHORT TERM GOAL #3   Title  Pt will demonstrate stepping over objects 6-8" tall without AD with supervision  in order to reduce falls risk     Baseline  11/24/17: Pt demonstrated ability to step over objects 6-8" without AD with supervision in order to reduce falls risk.    Status  Achieved    Target Date  11/29/17      PT SHORT TERM GOAL #4   Title  pt will ambulate 600' without AD performing naming task with continual gait with supervision.     Baseline  11/24/17: Pt able to ambulate 650' without AD performing naming task with continual gait with supervision.     Status  Achieved    Target Date  11/29/17      PT SHORT TERM GOAL #5   Title  pain in right knee with daily activities decreased >/= 75%    Baseline  11/24/2017: Pt verbally expressed pain in right knee with daily activities decreased to >/= 25%    Status  Achieved        PT Long Term Goals - 11/16/17 1625      PT LONG TERM GOAL #1   Title  pt will demonstrate independence in ongoing HEP/ Fitness program in order to continue to maintain improvement made in PT (All LTGs Target Date  12/31/2017)    Time  2    Period  Months    Status  On-going    Target Date  12/31/17  PT LONG TERM GOAL #2   Title  Pt will demonstate a standard Timed Up-Go score of </=14.0" and a cognitive TUG score of</=15.0"    in order to improve functional gait     Baseline  standard-18.20" Cognitive 36.91"    Time  2    Period  Months    Status  On-going    Target Date  12/31/17      PT LONG TERM GOAL #3   Title  Pt will demonstate an Functional Gait Assessment score  >/= 24/30 in order to furhter improve funtional gait     Time  2    Period  Months    Status  On-going    Target Date  12/31/17      PT LONG TERM GOAL #4   Title  pt dill demonstrate gait speed of >/=2.62 ft/sec safely in order to indicate a safe community level ambulator    Baseline  2.54f/s    Time  2    Period  Months    Status  On-going    Target Date  12/31/17      PT LONG TERM GOAL #5   Title  pt will negotiate ramps,curbs,grass, and short paved distance without device modified independent to indicate ability for community access.     Baseline  min assist on curbs, supervision on ramps     Time  2    Period  Months    Status  On-going    Target Date  12/31/17      PT LONG TERM GOAL #6   Title  Pt will ambulate >/= 1000' while maintaining light conversation with fluent gait without device modified independent to enbable community mobility.     Time  2    Period  Months    Status  On-going    Target Date  12/31/17        Plan - 12/01/17 1600    Clinical Impression Statement  Today's skilled PT session focused on increasing gait distance with dual tasks, working on ankle, knee, and hip balance strategies, and dynamic walking on uneven surfaces. Pt continues to demonstrate ability to tolerate progressions of various exercises. Pt would benefit towards skilled PT interventions towards reaching unmet goals.     History and Personal Factors relevant to plan of care:  basal ganglia, CVA, HTN, DM 2, peripheral  neuropathy    Clinical Presentation  Evolving    Clinical Presentation due to:  high fall risk, lives alone     Clinical Decision Making  Moderate    Rehab Potential  Good    PT Frequency  2x / week    PT Duration  Other (comment)    PT Treatment/Interventions  ADLs/Self Care Home Management;Aquatic Therapy;Biofeedback;Cryotherapy;Electrical Stimulation;Moist Heat;Traction;Ultrasound;DME Instruction;Stair training;Functional mobility training;Therapeutic activities;Therapeutic exercise;Balance training;Neuromuscular re-education;Cognitive remediation;Patient/family education;Orthotic Fit/Training;Manual techniques;Passive range of motion;Taping    PT Next Visit Plan  Plan to continue progressing dynamic balance such as reps of tandem walking forwards and backward, static balance on uneven surface (red and blue mat), LE strengthening, and SLS single leg balance and dual/multi task activities. Working towards LTG's.     Recommended Other Services  Speech therapy maybe later as patient did not want it currently    Consulted and Agree with Plan of Care  Patient    Family Member Consulted  --       Patient will benefit from skilled therapeutic intervention in order to improve the following deficits and impairments:  Abnormal gait,  Decreased balance, Decreased mobility, Difficulty walking, Decreased cognition, Decreased activity tolerance, Decreased safety awareness, Decreased strength, Impaired UE functional use, Decreased endurance, Postural dysfunction  Visit Diagnosis: Muscle weakness (generalized)  Hemiplegia and hemiparesis following cerebral infarction affecting right dominant side (HCC)  Spastic hemiplegia of right dominant side as late effect of cerebrovascular disease, unspecified cerebrovascular disease type [facility])     Problem List Patient Active Problem List   Diagnosis Date Noted  . Adhesive capsulitis of right shoulder 11/28/2017  . Left basal ganglia embolic stroke ([place])  16/06/9603  . Facial droop   . Right sided weakness   . Diabetes mellitus ([place])   . Diastolic dysfunction   . Dysphagia, post-stroke   . Dysarthria, post-stroke   . Hypokalemia   . Acute blood loss anemia   . Hyperlipidemia   . Stroke ([place]) 07/16/2017  . Hypertension 07/16/2017  . Arthritis of right hand 01/11/2017  . Osteoarthritis of right knee 01/06/2016  . Status post right partial knee replacement 01/06/2016    Carlena Sax 12/01/2017, 5:08 PM  [facility] [address] [place], [place], [address] Phone: [phone]   Fax:  [phone]  Name: Norena Bratton MRN: [id number] Date of Birth: 03-19-1941

## 2017-12-01 NOTE — Therapy (Signed)
[facility] [address] [place], [place], [address] Phone: [phone]   Fax:  [phone]  Occupational Therapy Treatment  Patient Details  Name: Kristi David MRN: [id number] Date of Birth: Aug 02, 1941 Referring Provider: Dr. Riley Kill   Encounter Date: 12/01/2017  OT End of Session - 12/01/17 1522    Visit Number  6    Number of Visits  16    Date for OT Re-Evaluation  12/27/17    Authorization Type  medicare    Authorization Time Period  60 days    OT Start Time  1407    OT Stop Time  1445    OT Time Calculation (min)  38 min    Activity Tolerance  Patient tolerated treatment well    Behavior During Therapy  [facility] for tasks assessed/performed       Past Medical History:  Diagnosis Date  . Acute blood loss anemia   . Anemia   . Arthritis    "legs, knees" (01/07/2016)  . Arthritis of right hand 01/11/2017  . Diabetes mellitus ([facility])   . Diastolic dysfunction   . Dysarthria, post-stroke   . Dysphagia, post-stroke   . Facial droop   . GERD (gastroesophageal reflux disease)    no meds  . High cholesterol   . History of blood transfusion   . Hyperlipidemia   . Hypertension   . Hypokalemia   . Left basal ganglia embolic stroke ([facility]) 07/18/2017  . Osteoarthritis of right knee 01/06/2016  . Seasonal allergies   . Shortness of breath dyspnea    if too active  . Stroke ([facility]) 07/16/2017  . Syncope    passed out and was hospitalized for a couple of days  . Type II diabetes mellitus ([facility])    does not take medicine diet controlled    Past Surgical History:  Procedure Laterality Date  . BUNIONECTOMY Bilateral   . CATARACT EXTRACTION W/ INTRAOCULAR LENS  IMPLANT, BILATERAL Bilateral   . INGUINAL HERNIA REPAIR Right   . KNEE ARTHROSCOPY WITH EXCISION BAKER'S CYST Right   . MR LOWER LEG LEFT ([facility] HX) Left    after a break  . PARTIAL KNEE ARTHROPLASTY Right 01/06/2016   Procedure: RIGHT UNICOMPARTMENTAL KNEE  ARTHROPLASTY;  Surgeon: Kathryne Hitch, MD;  Location: [facility];  Service: Orthopedics;  Laterality: Right;  . VEIN LIGATION AND STRIPPING Bilateral     There were no vitals filed for this visit.  Subjective Assessment - 12/01/17 1409    Pertinent History  L BG CVA 07/16/2017.  Depression, HTN, HLD, DM, peripheral neuropathy.      Patient Stated Goals  to use my arm better    Currently in Pain?  No/denies             Treatment: supine gentle joint mobs to right shoulder followed by closed chain shoulder flexion  3 sets x 5 reps with therapist facilitating right elbow and shoulder Seated reviewed HEP 10 reps each min v.c for performance Seated at table gentle P/ROM to digits, followed by pt flipping playing cards with RUE, min-mod difficulty, min facilitation                OT Short Term Goals - 11/28/17 1324      OT SHORT TERM GOAL #1   Title  Pt will be mod I with HEP for RUE - 11/29/2017    Status  On-going      OT SHORT TERM GOAL #2   Title  Pt will rate pain no greater than 3/10 for PROM for shoulder flexion to 100* in supine to allow for adequate ROM for self care activities.     Status  Achieved      OT SHORT TERM GOAL #3   Title  Pt will demonstrate ability to grasp cylindrical light weight object in R hand during light manipulation of item with L hand.     Status  Achieved      OT SHORT TERM GOAL #4   Title  Pt will demonstrate ability for mod I for bathing at shower level    Status  Achieved        OT Long Term Goals - 11/28/17 1325      OT LONG TERM GOAL #1   Title  Pt will be mod I with upgraded HEP prn - 12/27/2017    Status  On-going      OT LONG TERM GOAL #2   Title  Pt will rate pain in L shoulder no greater than 3/10 with functional use of LUE as stabilizer/occassional gross assist    Status  On-going      OT LONG TERM GOAL #3   Title  Pt will demonstrate ability to use RUE as stabilizer at least 50% of the time with basic self  care activities.     Status  On-going      OT LONG TERM GOAL #4   Title  Pt will demonstrate shoulder flexion for RUE to at least 50* in preparation for low reach    Status  On-going            Plan - 12/01/17 1522    Clinical Impression Statement  Pt with slow progress toward goals. Pt demonstrates improved perfromance of RUE use following repetition.    Rehab Potential  Fair    Current Impairments/barriers affecting progress:  severity of UE deficits, R inattention    OT Frequency  2x / week    OT Duration  8 weeks    OT Treatment/Interventions  Self-care/ADL training;Cryotherapy;Moist Heat;Electrical Stimulation;DME and/or AE instruction;Neuromuscular education;Therapeutic exercise;Functional Mobility Training;Manual Therapy;Passive range of motion;Splinting;Therapeutic activities;Cognitive remediation/compensation;Patient/family education;Balance training    Plan  manual therapy, NMR for RUE/trunk,  add to HEP as possible    Consulted and Agree with Plan of Care  Patient       Patient will benefit from skilled therapeutic intervention in order to improve the following deficits and impairments:  Abnormal gait, Decreased activity tolerance, Decreased cognition, Decreased balance, Decreased knowledge of use of DME, Decreased range of motion, Decreased mobility, Decreased strength, Increased edema, Difficulty walking, Impaired UE functional use, Impaired tone, Impaired sensation, Pain  Visit Diagnosis: Muscle weakness (generalized)  Hemiplegia and hemiparesis following cerebral infarction affecting right dominant side (HCC)  Spastic hemiplegia of right dominant side as late effect of cerebrovascular disease, unspecified cerebrovascular disease type (HCC)  Stiffness of right shoulder, not elsewhere classified  Stiffness of right hand, not elsewhere classified    Problem List Patient Active Problem List   Diagnosis Date Noted  . Adhesive capsulitis of right shoulder  11/28/2017  . Left basal ganglia embolic stroke (HCC) 07/18/2017  . Facial droop   . Right sided weakness   . Diabetes mellitus (H[facility])   . Diastolic dysfunction   . Dysphagia, post-stroke   . Dysarthria, post-stroke   . Hypokalemia   . Acute blood loss anemia   . Hyperlipidemia   . Stroke (HCC) 07/16/2017  . Hypertension 07/16/2017  . Arthritis  of right hand 01/11/2017  . Osteoarthritis of right knee 01/06/2016  . Status post right partial knee replacement 01/06/2016    Mack Thurmon 12/01/2017, 3:23 PM  [facility] [address] [place], [place], [address] Phone: [phone]   Fax:  [phone]  Name: Kristi David MRN: [id number] Date of Birth: 09/11/1941

## 2017-12-05 ENCOUNTER — Encounter: Payer: Self-pay | Admitting: Occupational Therapy

## 2017-12-05 ENCOUNTER — Ambulatory Visit: Payer: Medicare Other | Admitting: Occupational Therapy

## 2017-12-05 ENCOUNTER — Encounter: Payer: Self-pay | Admitting: Physical Therapy

## 2017-12-05 ENCOUNTER — Ambulatory Visit: Payer: Medicare Other | Admitting: Physical Therapy

## 2017-12-05 DIAGNOSIS — I69318 Other symptoms and signs involving cognitive functions following cerebral infarction: Secondary | ICD-10-CM

## 2017-12-05 DIAGNOSIS — M79641 Pain in right hand: Secondary | ICD-10-CM

## 2017-12-05 DIAGNOSIS — R2681 Unsteadiness on feet: Secondary | ICD-10-CM

## 2017-12-05 DIAGNOSIS — M25611 Stiffness of right shoulder, not elsewhere classified: Secondary | ICD-10-CM | POA: Diagnosis not present

## 2017-12-05 DIAGNOSIS — I69951 Hemiplegia and hemiparesis following unspecified cerebrovascular disease affecting right dominant side: Secondary | ICD-10-CM | POA: Diagnosis not present

## 2017-12-05 DIAGNOSIS — M25511 Pain in right shoulder: Secondary | ICD-10-CM

## 2017-12-05 DIAGNOSIS — M6281 Muscle weakness (generalized): Secondary | ICD-10-CM | POA: Diagnosis not present

## 2017-12-05 DIAGNOSIS — R293 Abnormal posture: Secondary | ICD-10-CM

## 2017-12-05 DIAGNOSIS — M25641 Stiffness of right hand, not elsewhere classified: Secondary | ICD-10-CM

## 2017-12-05 NOTE — Therapy (Signed)
[facility] [address] [place], [place], [address] Phone: [phone]   Fax:  [phone]  Physical Therapy Treatment  Patient Details  Name: Kristi David MRN: [id number] Date of Birth: 12-28-1940 Referring Provider: Dr. Naaman Plummer   Encounter Date: 12/05/2017  PT End of Session - 12/05/17 1704    Visit Number  9    Number of Visits  18    Date for PT Re-Evaluation  12/30/17    PT Start Time  1400    PT Stop Time  1445    PT Time Calculation (min)  45 min    Equipment Utilized During Treatment  Gait belt    Activity Tolerance  Patient tolerated treatment well    Behavior During Therapy  [facility] for tasks assessed/performed       Past Medical History:  Diagnosis Date  . Acute blood loss anemia   . Anemia   . Arthritis    "legs, knees" (01/07/2016)  . Arthritis of right hand 01/11/2017  . Diabetes mellitus ([place])   . Diastolic dysfunction   . Dysarthria, post-stroke   . Dysphagia, post-stroke   . Facial droop   . GERD (gastroesophageal reflux disease)    no meds  . High cholesterol   . History of blood transfusion   . Hyperlipidemia   . Hypertension   . Hypokalemia   . Left basal ganglia embolic stroke ([place]) 37/44/5146  . Osteoarthritis of right knee 01/06/2016  . Seasonal allergies   . Shortness of breath dyspnea    if too active  . Stroke ([place]) 07/16/2017  . Syncope    passed out and was hospitalized for a couple of days  . Type II diabetes mellitus ([place])    does not take medicine diet controlled    Past Surgical History:  Procedure Laterality Date  . BUNIONECTOMY Bilateral   . CATARACT EXTRACTION W/ INTRAOCULAR LENS  IMPLANT, BILATERAL Bilateral   . INGUINAL HERNIA REPAIR Right   . KNEE ARTHROSCOPY WITH EXCISION BAKER'S CYST Right   . MR LOWER LEG LEFT ([facility] HX) Left    after a break  . PARTIAL KNEE ARTHROPLASTY Right 01/06/2016   Procedure: RIGHT UNICOMPARTMENTAL KNEE ARTHROPLASTY;  Surgeon:  Mcarthur Rossetti, MD;  Location: [facility];  Service: Orthopedics;  Laterality: Right;  . VEIN LIGATION AND STRIPPING Bilateral     There were no vitals filed for this visit.  Subjective Assessment - 12/05/17 1453    Subjective  Pt reported no falls, no changes in medication, and no pain today. Pt also stated "I am still waiting for one of my medications from my pharmacy, the doctor order was messed up."     Patient is accompained by:  Family member    Limitations  Walking;House hold activities    Patient Stated Goals  pt wants to use R hand more efficently and walk without use of AD in community     Currently in Pain?  No/denies        [facility] Adult PT Treatment/Exercise - 12/05/17 1641      Ambulation/Gait   Ambulation/Gait  Yes    Ambulation/Gait Assistance  5: Supervision    Ambulation/Gait Assistance Details  Pt performed gait without any assistive device.     Ambulation Distance (Feet)  100 Feet x 4 reps between different therapeutic activities.     Assistive device  None    Ambulation Surface  Indoor;Level    Gait Comments  Pt ambulated without use of  an AD. Pt performed verbally critical thinking, scanning for various colored weighted small balls strargically place below knee level for dual tasks x 2 reps for 100 feet each. Pt was encouraged to reach down to grab the ball with right hand/arm to provide additional carryover from OT session.       High Level Balance   High Level Balance Activities  Marching forwards;Marching backwards;Tandem walking;Side stepping;Backward walking ;Added forward heel walking; toe walking     High Level Balance Comments  Used red and blue mats for uneven surface for added balance challenge with all activities. Pt able to perform task safely without any loss of balance, pt did have some lateral swaying/leaning at times. Pt used single intermediate UE support as needed to regain upright balance.  Required initial demo and verbal cues for task  performance new tasks toe walking and heel walking. Verbal and initial tactile cues required during forward and backwards march, for higher hip and knee flexion of leg (tendency for a low march) . During side stepping, pt required two times of demonstration and verbal to keep feet neutral facing countertop and keeping pelvis facing the counter (tendency to excessively externally rotate leading hip in both left and right directions).       Neuro Re-ed    Neuro Re-ed Details   Ball toss to wall and catch, with one foot on floor and contralateral foot on small foam block x 10 catches with each foot placed on foam block (lowest block height) SPTA provided supervision assistance via gait belt. Pt able had difficulty maintaining upright posture on single leg stance bilaterally on highest level of small foam, had to place it to the lowest level so patient was able to perform task.       Knee/Hip Exercises: Aerobic   Stepper  10 minutes (no charge after PT session)      Balance Exercises - 12/05/17 1659      Balance Exercises: Standing   Rockerboard  Anterior/posterior;Lateral;EO;10 reps;EC;Intermittent UE support;Head turns;UE support Added head turns up/down, left/right today      Balance Exercises: Standing   Rebounder Limitations  Pt used large rocker board, with pt at parallel bars and SPTA supervision to min assist via gait belt for safety. Pt only used B LE of parellel bars to get onto and off large rockerboard. Otherwise, used only single UE support to intermed. UE support to regain upright balance. Performed each task for 2 reps x 30 seconds. Pt had the most posterior leaning/swaying with EC and head turns up and down.         PT Short Term Goals - 11/24/17 1540      PT SHORT TERM GOAL #1   Title  Pt will deomonstate independence with initial HEP (All STGs Target Date 11/29/2017)    Baseline  11/24/17- Pt verbally expressed confidence with HEP independence and initial HEP.     Status   Achieved    Target Date  11/29/17      PT SHORT TERM GOAL #2   Title  pt will demonstrate tandem walking 15steps min guard     Baseline  11/24/17: 9 steps with min guard, single UE assistance needed afterwards.    Status  Partially Met    Target Date  11/29/17      PT SHORT TERM GOAL #3   Title  Pt will demonstrate stepping over objects 6-8" tall without AD with supervision  in order to reduce falls risk  Baseline  11/24/17: Pt demonstrated ability to step over objects 6-8" without AD with supervision in order to reduce falls risk.    Status  Achieved    Target Date  11/29/17      PT SHORT TERM GOAL #4   Title  pt will ambulate 600' without AD performing naming task with continual gait with supervision.     Baseline  11/24/17: Pt able to ambulate 650' without AD performing naming task with continual gait with supervision.     Status  Achieved    Target Date  11/29/17      PT SHORT TERM GOAL #5   Title  pain in right knee with daily activities decreased >/= 75%    Baseline  11/24/2017: Pt verbally expressed pain in right knee with daily activities decreased to >/= 25%    Status  Achieved        PT Long Term Goals - 11/16/17 1625      PT LONG TERM GOAL #1   Title  pt will demonstrate independence in ongoing HEP/ Fitness program in order to continue to maintain improvement made in PT (All LTGs Target Date 12/31/2017)    Time  2    Period  Months    Status  On-going    Target Date  12/31/17      PT LONG TERM GOAL #2   Title  Pt will demonstate a standard Timed Up-Go score of </=14.0" and a cognitive TUG score of</=15.0"    in order to improve functional gait     Baseline  standard-18.20" Cognitive 36.91"    Time  2    Period  Months    Status  On-going    Target Date  12/31/17      PT LONG TERM GOAL #3   Title  Pt will demonstate an Functional Gait Assessment score  >/= 24/30 in order to furhter improve funtional gait     Time  2    Period  Months    Status  On-going     Target Date  12/31/17      PT LONG TERM GOAL #4   Title  pt dill demonstrate gait speed of >/=2.62 ft/sec safely in order to indicate a safe community level ambulator    Baseline  2.53f/s    Time  2    Period  Months    Status  On-going    Target Date  12/31/17      PT LONG TERM GOAL #5   Title  pt will negotiate ramps,curbs,grass, and short paved distance without device modified independent to indicate ability for community access.     Baseline  min assist on curbs, supervision on ramps     Time  2    Period  Months    Status  On-going    Target Date  12/31/17      PT LONG TERM GOAL #6   Title  Pt will ambulate >/= 1000' while maintaining light conversation with fluent gait without device modified independent to enbable community mobility.     Time  2    Period  Months    Status  On-going    Target Date  12/31/17         Plan - 12/05/17 1704    Clinical Impression Statement  Today's skilled session focused on gait with visual scanning dual tasks, progressing static balance, and dynamic balance while walking in various directions on uneven surfaces. Pt tolerated progressions from today's  sessions well. Pt continues to make progress towards LTG's. Pt would also benefit from skilled PT interventions towards reaching unmet LTG's.     History and Personal Factors relevant to plan of care:  basal ganglia, CVA, HTN, DM 2, peripheral neuropathy    Clinical Presentation  Evolving    Clinical Presentation due to:  high fall risk, lives alone    Clinical Decision Making  Moderate    Rehab Potential  Good    PT Frequency  2x / week    PT Duration  Other (comment)    PT Treatment/Interventions  ADLs/Self Care Home Management;Aquatic Therapy;Biofeedback;Cryotherapy;Electrical Stimulation;Moist Heat;Traction;Ultrasound;DME Instruction;Stair training;Functional mobility training;Therapeutic activities;Therapeutic exercise;Balance training;Neuromuscular re-education;Cognitive  remediation;Patient/family education;Orthotic Fit/Training;Manual techniques;Passive range of motion;Taping    PT Next Visit Plan  Plan to continue progressing gait distance indoors & outdoors if weather allows; dynamic balance walking on colored mats, SLS single leg balance and dual/multi task activities. Working towards LTG's.     Consulted and Agree with Plan of Care  Patient       Patient will benefit from skilled therapeutic intervention in order to improve the following deficits and impairments:  Abnormal gait, Decreased balance, Decreased mobility, Difficulty walking, Decreased cognition, Decreased activity tolerance, Decreased safety awareness, Decreased strength, Impaired UE functional use, Decreased endurance, Postural dysfunction  Visit Diagnosis: Muscle weakness (generalized)  Spastic hemiplegia of right dominant side as late effect of cerebrovascular disease, unspecified cerebrovascular disease type ([place])  Unsteadiness on feet     Problem List Patient Active Problem List   Diagnosis Date Noted  . Adhesive capsulitis of right shoulder 11/28/2017  . Left basal ganglia embolic stroke ([place]) 91/47/8295  . Facial droop   . Right sided weakness   . Diabetes mellitus ([place])   . Diastolic dysfunction   . Dysphagia, post-stroke   . Dysarthria, post-stroke   . Hypokalemia   . Acute blood loss anemia   . Hyperlipidemia   . Stroke ([place]) 07/16/2017  . Hypertension 07/16/2017  . Arthritis of right hand 01/11/2017  . Osteoarthritis of right knee 01/06/2016  . Status post right partial knee replacement 01/06/2016    Carlena Sax 12/05/2017, 5:10 PM  [facility] [address] [place], [place], [address] Phone: [phone]   Fax:  [phone]  Name: Symphony Demuro MRN: [id number] Date of Birth: Aug 13, 1941

## 2017-12-05 NOTE — Therapy (Signed)
[facility] [address] [place], [place], [address] Phone: [phone]   Fax:  [phone]  Occupational Therapy Treatment  Patient Details  Name: Kristi David MRN: [id number] Date of Birth: 06/25/1941 Referring Provider: Dr. Riley KillSwartz   Encounter Date: 12/05/2017  OT End of Session - 12/05/17 1628    Visit Number  7    Number of Visits 16   Date for OT Re-Evaluation  12/27/17    Authorization Type  medicare    Authorization Time Period  60 days    OT Start Time  1317    OT Stop Time  1400    OT Time Calculation (min)  43 min    Activity Tolerance  Patient tolerated treatment well       Past Medical History:  Diagnosis Date  . Acute blood loss anemia   . Anemia   . Arthritis    "legs, knees" (01/07/2016)  . Arthritis of right hand 01/11/2017  . Diabetes mellitus ([facility])   . Diastolic dysfunction   . Dysarthria, post-stroke   . Dysphagia, post-stroke   . Facial droop   . GERD (gastroesophageal reflux disease)    no meds  . High cholesterol   . History of blood transfusion   . Hyperlipidemia   . Hypertension   . Hypokalemia   . Left basal ganglia embolic stroke ([facility]) 07/18/2017  . Osteoarthritis of right knee 01/06/2016  . Seasonal allergies   . Shortness of breath dyspnea    if too active  . Stroke ([facility]) 07/16/2017  . Syncope    passed out and was hospitalized for a couple of days  . Type II diabetes mellitus ([facility])    does not take medicine diet controlled    Past Surgical History:  Procedure Laterality Date  . BUNIONECTOMY Bilateral   . CATARACT EXTRACTION W/ INTRAOCULAR LENS  IMPLANT, BILATERAL Bilateral   . INGUINAL HERNIA REPAIR Right   . KNEE ARTHROSCOPY WITH EXCISION BAKER'S CYST Right   . MR LOWER LEG LEFT ([facility] HX) Left    after a break  . PARTIAL KNEE ARTHROPLASTY Right 01/06/2016   Procedure: RIGHT UNICOMPARTMENTAL KNEE ARTHROPLASTY;  Surgeon: Kathryne Hitchhristopher Y Blackman, MD;  [facility];   Service: Orthopedics;  Laterality: Right;  . VEIN LIGATION AND STRIPPING Bilateral     There were no vitals filed for this visit.  Subjective Assessment - 12/05/17 1321    Subjective   I am taking the tylenol every 6 hours like the doctor said to and its helping.    Pertinent History  L BG CVA 07/16/2017.  Depression, HTN, HLD, DM, peripheral neuropathy.      Patient Stated Goals  to use my arm better    Currently in Pain?  No/denies                   OT Treatments/Exercises (OP) - 12/05/17 0001      Neurological Re-education Exercises   Other Exercises 1  Neuro re ed in supine closed chain for mid to high reach progressing to unilateral open chain with grasp and release task in supine.Transtioned into sitting and addressed low reach with RUE with emphasis on focused grasp and release - pt needs cues for full motor participation due to R inattention.  Pt able to complete low reach and pick up several light objects of different sizes and shapes.  Also addressed using RUE as stabilizer when opening containers of various sizes and shapes.  OT Education - 12/05/17 1624    Education provided  Yes    Education Details  use of RUE as stabilizer during functional tasks    Methods  Explanation;Demonstration;Verbal cues    Comprehension  Verbalized understanding;Returned demonstration       OT Short Term Goals - 12/05/17 1625      OT SHORT TERM GOAL #1   Title  Pt will be mod I with HEP for RUE - 11/29/2017    Status  Achieved      OT SHORT TERM GOAL #2   Title  Pt will rate pain no greater than 3/10 for PROM for shoulder flexion to 100* in supine to allow for adequate ROM for self care activities.     Status  Achieved      OT SHORT TERM GOAL #3   Title  Pt will demonstrate ability to grasp cylindrical light weight object in R hand during light manipulation of item with L hand.     Status  Achieved      OT SHORT TERM GOAL #4   Title  Pt will demonstrate  ability for mod I for bathing at shower level    Status  Achieved        OT Long Term Goals - 12/05/17 1625      OT LONG TERM GOAL #1   Title  Pt will be mod I with upgraded HEP prn - 12/27/2017    Status  On-going      OT LONG TERM GOAL #2   Title  Pt will rate pain in L shoulder no greater than 3/10 with functional use of LUE as stabilizer/occassional gross assist    Status  On-going      OT LONG TERM GOAL #3   Title  Pt will demonstrate ability to use RUE as stabilizer at least 50% of the time with basic self care activities.     Status  On-going      OT LONG TERM GOAL #4   Title  Pt will demonstrate shoulder flexion for RUE to at least 50* in preparation for low reach    Status  On-going            Plan - 12/05/17 1625    Clinical Impression Statement  Pt progressing toward goals. Pt with R inattention and learned non use of RUE - with cues, structure and repetition, pt improves with functional use.    Occupational Profile and client history currently impacting functional performance  PMH:  Depression, HTN, HLD, DM, peripheral neuropathy.      Occupational performance deficits (Please refer to evaluation for details):  ADL's;IADL's;Leisure;Social Participation    Rehab Potential  Fair    Current Impairments/barriers affecting progress:  severity of UE deficits, R inattention    OT Frequency  2x / week    OT Duration  8 weeks    OT Treatment/Interventions  Self-care/ADL training;Cryotherapy;Moist Heat;Electrical Stimulation;DME and/or AE instruction;Neuromuscular education;Therapeutic exercise;Functional Mobility Training;Manual Therapy;Passive range of motion;Splinting;Therapeutic activities;Cognitive remediation/compensation;Patient/family education;Balance training    Plan  manual therapy, NMR for RUE/trunk,  add to HEP as possible    Consulted and Agree with Plan of Care  Patient       Patient will benefit from skilled therapeutic intervention in order to improve the  following deficits and impairments:  Abnormal gait, Decreased activity tolerance, Decreased cognition, Decreased balance, Decreased knowledge of use of DME, Decreased range of motion, Decreased mobility, Decreased strength, Increased edema, Difficulty walking, Impaired UE functional use,  Impaired tone, Impaired sensation, Pain  Visit Diagnosis: Muscle weakness (generalized)  Spastic hemiplegia of right dominant side as late effect of cerebrovascular disease, unspecified cerebrovascular disease type ([facility])  Stiffness of right shoulder, not elsewhere classified  Stiffness of right hand, not elsewhere classified  Unsteadiness on feet  Abnormal posture  Pain of right shoulder joint on movement  Pain in right hand  Other symptoms and signs involving cognitive functions following cerebral infarction    Problem List Patient Active Problem List   Diagnosis Date Noted  . Adhesive capsulitis of right shoulder 11/28/2017  . Left basal ganglia embolic stroke ([facility]) 07/18/2017  . Facial droop   . Right sided weakness   . Diabetes mellitus ([facility])   . Diastolic dysfunction   . Dysphagia, post-stroke   . Dysarthria, post-stroke   . Hypokalemia   . Acute blood loss anemia   . Hyperlipidemia   . Stroke ([facility]) 07/16/2017  . Hypertension 07/16/2017  . Arthritis of right hand 01/11/2017  . Osteoarthritis of right knee 01/06/2016  . Status post right partial knee replacement 01/06/2016    Kristi David, Kristi David 12/05/2017, 4:36 PM  [facility] [address] [place], [place], [address] Phone: [phone]   Fax:  [phone]  Name: Kristi David MRN: [id number] Date of Birth: 1941/01/10

## 2017-12-07 ENCOUNTER — Ambulatory Visit: Payer: Medicare Other | Admitting: Physical Therapy

## 2017-12-07 ENCOUNTER — Telehealth: Payer: Self-pay | Admitting: Physical Therapy

## 2017-12-07 ENCOUNTER — Ambulatory Visit: Payer: Medicare Other | Admitting: Occupational Therapy

## 2017-12-13 ENCOUNTER — Encounter: Payer: Self-pay | Admitting: Occupational Therapy

## 2017-12-13 ENCOUNTER — Ambulatory Visit: Payer: Medicare Other | Admitting: Occupational Therapy

## 2017-12-13 ENCOUNTER — Ambulatory Visit: Payer: Medicare Other | Admitting: Physical Therapy

## 2017-12-13 ENCOUNTER — Encounter: Payer: Self-pay | Admitting: Physical Therapy

## 2017-12-13 DIAGNOSIS — M25611 Stiffness of right shoulder, not elsewhere classified: Secondary | ICD-10-CM

## 2017-12-13 DIAGNOSIS — M6281 Muscle weakness (generalized): Secondary | ICD-10-CM | POA: Diagnosis not present

## 2017-12-13 DIAGNOSIS — R2689 Other abnormalities of gait and mobility: Secondary | ICD-10-CM

## 2017-12-13 DIAGNOSIS — I69951 Hemiplegia and hemiparesis following unspecified cerebrovascular disease affecting right dominant side: Secondary | ICD-10-CM

## 2017-12-13 DIAGNOSIS — M25641 Stiffness of right hand, not elsewhere classified: Secondary | ICD-10-CM

## 2017-12-13 DIAGNOSIS — R2681 Unsteadiness on feet: Secondary | ICD-10-CM | POA: Diagnosis not present

## 2017-12-13 DIAGNOSIS — R293 Abnormal posture: Secondary | ICD-10-CM

## 2017-12-13 DIAGNOSIS — I69318 Other symptoms and signs involving cognitive functions following cerebral infarction: Secondary | ICD-10-CM

## 2017-12-13 DIAGNOSIS — I639 Cerebral infarction, unspecified: Secondary | ICD-10-CM

## 2017-12-13 DIAGNOSIS — M79641 Pain in right hand: Secondary | ICD-10-CM

## 2017-12-13 DIAGNOSIS — M25511 Pain in right shoulder: Secondary | ICD-10-CM

## 2017-12-13 NOTE — Therapy (Signed)
[facility] [address] [place], [place], [address] Phone: [phone]   Fax:  [phone]  Occupational Therapy Treatment  Patient Details  Name: Kristi NgHettie David MRN: [id number] Date of Birth: 01/28/1941 Referring Provider: Dr. Riley KillSwartz   Encounter Date: 12/13/2017  OT End of Session - 12/13/17 1630    Visit Number  8    Number of Visits  16    Date for OT Re-Evaluation  12/27/17    Authorization Type  medicare    Authorization Time Period  60 days    OT Start Time  1320 pt was using bathroom at beginning of session    OT Stop Time  1400    OT Time Calculation (min)  40 min    Activity Tolerance  Patient tolerated treatment well       Past Medical History:  Diagnosis Date  . Acute blood loss anemia   . Anemia   . Arthritis    "legs, knees" (01/07/2016)  . Arthritis of right hand 01/11/2017  . Diabetes mellitus (HCC)   . Diastolic dysfunction   . Dysarthria, post-stroke   . Dysphagia, post-stroke   . Facial droop   . GERD (gastroesophageal reflux disease)    no meds  . High cholesterol   . History of blood transfusion   . Hyperlipidemia   . Hypertension   . Hypokalemia   . Left basal ganglia embolic stroke (HCC) 07/18/2017  . Osteoarthritis of right knee 01/06/2016  . Seasonal allergies   . Shortness of breath dyspnea    if too active  . Stroke (HCC) 07/16/2017  . Syncope    passed out and was hospitalized for a couple of days  . Type II diabetes mellitus (HCC)    does not take medicine diet controlled    Past Surgical History:  Procedure Laterality Date  . BUNIONECTOMY Bilateral   . CATARACT EXTRACTION W/ INTRAOCULAR LENS  IMPLANT, BILATERAL Bilateral   . INGUINAL HERNIA REPAIR Right   . KNEE ARTHROSCOPY WITH EXCISION BAKER'S CYST Right   . MR LOWER LEG LEFT ([facility] HX) Left    after a break  . PARTIAL KNEE ARTHROPLASTY Right 01/06/2016   Procedure: RIGHT UNICOMPARTMENTAL KNEE ARTHROPLASTY;   Surgeon: Kathryne Hitchhristopher Y Blackman, MD;  [facility];  Service: Orthopedics;  Laterality: Right;  . VEIN LIGATION AND STRIPPING Bilateral     There were no vitals filed for this visit.  Subjective Assessment - 12/13/17 1321    Subjective   I am sorry I missed my appointment last week - I thought it was Thursday instead of Wednesday!    Pertinent History  L BG CVA 07/16/2017.  Depression, HTN, HLD, DM, peripheral neuropathy.      Patient Stated Goals  to use my arm better    Currently in Pain?  No/denies                   OT Treatments/Exercises (OP) - 12/13/17 1623      Neurological Re-education Exercises   Other Exercises 1  Neuro re ed following 10 minutes of estim to address functional grasp and release with mid reach. Pt needs light faciliation for mid reach and vc's to reach with hand vs hiking shoulder.  Pt also needs max cues to fully open her hand and then actively use her hand to pick up object due to R inattention.  Followed this task with bilateral task of folding towels and pillow cases with cues to use  R hand as dominant during this task.  Pt's performance improves with practice and when looking at Right hand.        Modalities   Modalities  Geologist, engineering Location  R hand for finger flexion/grasp    Electrical Stimulation Goals  Strength;Neuromuscular facilitation awareness of R hand               OT Short Term Goals - 12/13/17 1626      OT SHORT TERM GOAL #1   Title  Pt will be mod I with HEP for RUE - 11/29/2017    Status  Achieved      OT SHORT TERM GOAL #2   Title  Pt will rate pain no greater than 3/10 for PROM for shoulder flexion to 100* in supine to allow for adequate ROM for self care activities.     Status  Achieved      OT SHORT TERM GOAL #3   Title  Pt will demonstrate ability to grasp cylindrical light weight object in R hand during light manipulation of item with L hand.      Status  Achieved      OT SHORT TERM GOAL #4   Title  Pt will demonstrate ability for mod I for bathing at shower level    Status  Achieved        OT Long Term Goals - 12/13/17 1626      OT LONG TERM GOAL #1   Title  Pt will be mod I with upgraded HEP prn - 12/27/2017    Status  On-going      OT LONG TERM GOAL #2   Title  Pt will rate pain in R shoulder no greater than 3/10 with functional use of RUE as stabilizer/occassional gross assist    Status  On-going      OT LONG TERM GOAL #3   Title  Pt will demonstrate ability to use RUE as stabilizer at least 50% of the time with basic self care activities.     Status  On-going      OT LONG TERM GOAL #4   Title  Pt will demonstrate shoulder flexion for RUE to at least 50* in preparation for low reach    Status  On-going            Plan - 12/13/17 1628    Clinical Impression Statement  Pt progressing toward goals. Pt with improved ability to use R hand  and reports she is using it when dressing, bathing and in the kitchen    Occupational Profile and client history currently impacting functional performance  PMH:  Depression, HTN, HLD, DM, peripheral neuropathy.      Occupational performance deficits (Please refer to evaluation for details):  ADL's;IADL's;Leisure;Social Participation    Rehab Potential  Fair    OT Frequency  2x / week    OT Duration  8 weeks    OT Treatment/Interventions  Self-care/ADL training;Cryotherapy;Moist Heat;Electrical Stimulation;DME and/or AE instruction;Neuromuscular education;Therapeutic exercise;Functional Mobility Training;Manual Therapy;Passive range of motion;Splinting;Therapeutic activities;Cognitive remediation/compensation;Patient/family education;Balance training    Plan  manual therapy, NMR for RUE/trunk,  add to HEP as possible, functional use of R hand     Consulted and Agree with Plan of Care  Patient       Patient will benefit from skilled therapeutic intervention in order to improve  the following deficits and impairments:  Abnormal gait, Decreased activity tolerance, Decreased cognition,  Decreased balance, Decreased knowledge of use of DME, Decreased range of motion, Decreased mobility, Decreased strength, Increased edema, Difficulty walking, Impaired UE functional use, Impaired tone, Impaired sensation, Pain  Visit Diagnosis: Muscle weakness (generalized)  Spastic hemiplegia of right dominant side as late effect of cerebrovascular disease, unspecified cerebrovascular disease type (HCC)  Unsteadiness on feet  Stiffness of right shoulder, not elsewhere classified  Stiffness of right hand, not elsewhere classified  Abnormal posture  Pain of right shoulder joint on movement  Pain in right hand  Other symptoms and signs involving cognitive functions following cerebral infarction    Problem List Patient Active Problem List   Diagnosis Date Noted  . Adhesive capsulitis of right shoulder 11/28/2017  . Left basal ganglia embolic stroke (HCC) 07/18/2017  . Facial droop   . Right sided weakness   . Diabetes mellitus (HCC)   . Diastolic dysfunction   . Dysphagia, post-stroke   . Dysarthria, post-stroke   . Hypokalemia   . Acute blood loss anemia   . Hyperlipidemia   . Stroke (HCC) 07/16/2017  . Hypertension 07/16/2017  . Arthritis of right hand 01/11/2017  . Osteoarthritis of right knee 01/06/2016  . Status post right partial knee replacement 01/06/2016    Norton Pastel , OTR/L 12/13/2017, 4:32 PM  [facility] [address] [place], [place], [address] Phone: [phone]   Fax:  [phone]  Name: Ireland Virrueta MRN: [id number] Date of Birth: [birth year]

## 2017-12-13 NOTE — Therapy (Signed)
[facility] [address] [place], [place], [address] Phone: [phone]   Fax:  [phone]  Physical Therapy Treatment  Patient Details  Name: Kristi David MRN: [id number] Date of Birth: 1941-02-19 Referring Provider: Dr. Naaman Plummer   Encounter Date: 12/13/2017  PT End of Session - 12/13/17 1653    Visit Number  10    Number of Visits  18    Date for PT Re-Evaluation  12/30/17    PT Start Time  1400    PT Stop Time  1450    PT Time Calculation (min)  50 min    Equipment Utilized During Treatment  Gait belt    Activity Tolerance  Patient tolerated treatment well    Behavior During Therapy  [facility] for tasks assessed/performed       Past Medical History:  Diagnosis Date  . Acute blood loss anemia   . Anemia   . Arthritis    "legs, knees" (01/07/2016)  . Arthritis of right hand 01/11/2017  . Diabetes mellitus ([place])   . Diastolic dysfunction   . Dysarthria, post-stroke   . Dysphagia, post-stroke   . Facial droop   . GERD (gastroesophageal reflux disease)    no meds  . High cholesterol   . History of blood transfusion   . Hyperlipidemia   . Hypertension   . Hypokalemia   . Left basal ganglia embolic stroke ([place]) 63/84/6659  . Osteoarthritis of right knee 01/06/2016  . Seasonal allergies   . Shortness of breath dyspnea    if too active  . Stroke ([place]) 07/16/2017  . Syncope    passed out and was hospitalized for a couple of days  . Type II diabetes mellitus ([place])    does not take medicine diet controlled    Past Surgical History:  Procedure Laterality Date  . BUNIONECTOMY Bilateral   . CATARACT EXTRACTION W/ INTRAOCULAR LENS  IMPLANT, BILATERAL Bilateral   . INGUINAL HERNIA REPAIR Right   . KNEE ARTHROSCOPY WITH EXCISION BAKER'S CYST Right   . MR LOWER LEG LEFT ([place] HX) Left    after a break  . PARTIAL KNEE ARTHROPLASTY Right 01/06/2016   Procedure: RIGHT UNICOMPARTMENTAL KNEE ARTHROPLASTY;  Surgeon:  Mcarthur Rossetti, MD;  Location: [place];  Service: Orthopedics;  Laterality: Right;  . VEIN LIGATION AND STRIPPING Bilateral     There were no vitals filed for this visit.  Subjective Assessment - 12/13/17 1405    Subjective  pt reports she went to park with family and walked around staying on the pavement.  pt reports no falls since last PT session     Patient is accompained by:  Family member    Limitations  Walking;House hold activities    Patient Stated Goals  pt wants to use R hand more efficently and walk without use of AD in community     Currently in Pain?  No/denies                      [facility] Adult PT Treatment/Exercise - 12/13/17 1407      Transfers   Transfers  Sit to Stand;Stand to Sit    Sit to Stand  5: Supervision;From chair/3-in-1;With upper extremity assist;With armrests;From bed    Stand to Sit  5: Supervision;With upper extremity assist;To chair/3-in-1;With armrests;To bed      Ambulation/Gait   Ambulation/Gait  Yes    Ambulation/Gait Assistance  5: Supervision    Ambulation Distance (Feet)  1000 Feet    Assistive device  Straight cane    Gait Pattern  Step-through pattern;Decreased arm swing - right;Decreased dorsiflexion - right;Right hip hike;Lateral trunk lean to left;Poor foot clearance - right;Decreased stance time - right;Decreased stride length;Decreased hip/knee flexion - right;Decreased weight shift to right;Right genu recurvatum;Abducted- right;Shuffle    Ambulation Surface  Unlevel;Level;Indoor;Outdoor;Grass minG on grass with scanning task and light conversation     Door Management  6: Modified independent (Device/Increase time)      High Level Balance   High Level Balance Comments  BOSU step ups in // bars Min A PT DEMO, 5 x each foot with Bilateral UE support          Balance Exercises - 12/13/17 1407      Balance Exercises: Standing   SLS  Eyes open;Intermittent upper extremity support;3 reps;20 secs minA in //bars.     Standing, One Foot on a Step  Eyes open;Eyes closed;Head turns;8 inch;25 secs;3 reps on foam  under up and down foot         PT Education - 12/13/17 1649    Education provided  Yes    Education Details  falls reduction, ambulation c/ and c/o cane.    Person(s) Educated  Patient    Methods  Explanation;Demonstration    Comprehension  Verbalized understanding;Returned demonstration       PT Short Term Goals - 11/24/17 1540      PT SHORT TERM GOAL #1   Title  Pt will deomonstate independence with initial HEP (All STGs Target Date 11/29/2017)    Baseline  11/24/17- Pt verbally expressed confidence with HEP independence and initial HEP.     Status  Achieved    Target Date  11/29/17      PT SHORT TERM GOAL #2   Title  pt will demonstrate tandem walking 15steps min guard     Baseline  11/24/17: 9 steps with min guard, single UE assistance needed afterwards.    Status  Partially Met    Target Date  11/29/17      PT SHORT TERM GOAL #3   Title  Pt will demonstrate stepping over objects 6-8" tall without AD with supervision  in order to reduce falls risk     Baseline  11/24/17: Pt demonstrated ability to step over objects 6-8" without AD with supervision in order to reduce falls risk.    Status  Achieved    Target Date  11/29/17      PT SHORT TERM GOAL #4   Title  pt will ambulate 600' without AD performing naming task with continual gait with supervision.     Baseline  11/24/17: Pt able to ambulate 650' without AD performing naming task with continual gait with supervision.     Status  Achieved    Target Date  11/29/17      PT SHORT TERM GOAL #5   Title  pain in right knee with daily activities decreased >/= 75%    Baseline  11/24/2017: Pt verbally expressed pain in right knee with daily activities decreased to >/= 25%    Status  Achieved        PT Long Term Goals - 11/16/17 1625      PT LONG TERM GOAL #1   Title  pt will demonstrate independence in ongoing HEP/ Fitness program in  order to continue to maintain improvement made in PT (All LTGs Target Date 12/31/2017)    Time  2    Period  Months    Status  On-going    Target Date  12/31/17      PT LONG TERM GOAL #2   Title  Pt will demonstate a standard Timed Up-Go score of </=14.0" and a cognitive TUG score of</=15.0"    in order to improve functional gait     Baseline  standard-18.20" Cognitive 36.91"    Time  2    Period  Months    Status  On-going    Target Date  12/31/17      PT LONG TERM GOAL #3   Title  Pt will demonstate an Functional Gait Assessment score  >/= 24/30 in order to furhter improve funtional gait     Time  2    Period  Months    Status  On-going    Target Date  12/31/17      PT LONG TERM GOAL #4   Title  pt dill demonstrate gait speed of >/=2.62 ft/sec safely in order to indicate a safe community level ambulator    Baseline  2.55f/s    Time  2    Period  Months    Status  On-going    Target Date  12/31/17      PT LONG TERM GOAL #5   Title  pt will negotiate ramps,curbs,grass, and short paved distance without device modified independent to indicate ability for community access.     Baseline  min assist on curbs, supervision on ramps     Time  2    Period  Months    Status  On-going    Target Date  12/31/17      PT LONG TERM GOAL #6   Title  Pt will ambulate >/= 1000' while maintaining light conversation with fluent gait without device modified independent to enbable community mobility.     Time  2    Period  Months    Status  On-going    Target Date  12/31/17            Plan - 12/13/17 1656    Clinical Impression Statement  During today's PT session patient demonstrated her improved balance during dual gait tasks over uneven surface walking 10062fover grass, pavement, curbs, ramps and step over parking stops. Pt demonstrated improvements in her dynamic gait balance with no losses of balance during walk with scanning task. Pt continues to have difficulty with single leg  stance balance activates but continues to show slow progress.      Rehab Potential  Good    PT Frequency  2x / week    PT Duration  Other (comment)    PT Treatment/Interventions  ADLs/Self Care Home Management;Aquatic Therapy;Biofeedback;Cryotherapy;Electrical Stimulation;Moist Heat;Traction;Ultrasound;DME Instruction;Stair training;Functional mobility training;Therapeutic activities;Therapeutic exercise;Balance training;Neuromuscular re-education;Cognitive remediation;Patient/family education;Orthotic Fit/Training;Manual techniques;Passive range of motion;Taping    PT Next Visit Plan  continue to address dynamic walking balance with dual task gait. continue to progress SLS exercise.    Consulted and Agree with Plan of Care  Patient       Patient will benefit from skilled therapeutic intervention in order to improve the following deficits and impairments:  Abnormal gait, Decreased balance, Decreased mobility, Difficulty walking, Decreased cognition, Decreased activity tolerance, Decreased safety awareness, Decreased strength, Impaired UE functional use, Decreased endurance, Postural dysfunction  Visit Diagnosis: Muscle weakness (generalized)  Spastic hemiplegia of right dominant side as late effect of cerebrovascular disease, unspecified cerebrovascular disease type (HCC)  Unsteadiness on feet  Stiffness of right shoulder, not elsewhere classified  Abnormal posture  Stiffness of right hand, not elsewhere classified  Other abnormalities of gait and mobility  Left basal ganglia embolic stroke [facility])     Problem List Patient Active Problem List   Diagnosis Date Noted  . Adhesive capsulitis of right shoulder 11/28/2017  . Left basal ganglia embolic stroke ([place]) 30/03/6225  . Facial droop   . Right sided weakness   . Diabetes mellitus ([place])   . Diastolic dysfunction   . Dysphagia, post-stroke   . Dysarthria, post-stroke   . Hypokalemia   . Acute blood loss anemia   .  Hyperlipidemia   . Stroke ([place]) 07/16/2017  . Hypertension 07/16/2017  . Arthritis of right hand 01/11/2017  . Osteoarthritis of right knee 01/06/2016  . Status post right partial knee replacement 01/06/2016    Waunita Schooner SPT 12/13/2017, 4:58 PM  [facility] [address] [place], [place], [address] Phone: [phone]   Fax:  [phone]  Name: Kristi David MRN: [id number] Date of Birth: 1940-10-27

## 2017-12-14 ENCOUNTER — Ambulatory Visit: Payer: Medicare Other | Admitting: Physical Therapy

## 2017-12-14 ENCOUNTER — Encounter: Payer: Self-pay | Admitting: Occupational Therapy

## 2017-12-14 ENCOUNTER — Ambulatory Visit: Payer: Medicare Other | Admitting: Occupational Therapy

## 2017-12-14 ENCOUNTER — Encounter: Payer: Self-pay | Admitting: Physical Therapy

## 2017-12-14 DIAGNOSIS — R2681 Unsteadiness on feet: Secondary | ICD-10-CM

## 2017-12-14 DIAGNOSIS — M6281 Muscle weakness (generalized): Secondary | ICD-10-CM

## 2017-12-14 DIAGNOSIS — R293 Abnormal posture: Secondary | ICD-10-CM

## 2017-12-14 DIAGNOSIS — M25611 Stiffness of right shoulder, not elsewhere classified: Secondary | ICD-10-CM

## 2017-12-14 DIAGNOSIS — I69318 Other symptoms and signs involving cognitive functions following cerebral infarction: Secondary | ICD-10-CM

## 2017-12-14 DIAGNOSIS — M79641 Pain in right hand: Secondary | ICD-10-CM

## 2017-12-14 DIAGNOSIS — M25641 Stiffness of right hand, not elsewhere classified: Secondary | ICD-10-CM

## 2017-12-14 DIAGNOSIS — I69951 Hemiplegia and hemiparesis following unspecified cerebrovascular disease affecting right dominant side: Secondary | ICD-10-CM

## 2017-12-14 DIAGNOSIS — M25511 Pain in right shoulder: Secondary | ICD-10-CM

## 2017-12-14 NOTE — Therapy (Signed)
[facility] [address] [place], [place], [address] Phone: [phone]   Fax:  [phone]  Occupational Therapy Treatment  Patient Details  Name: Kristi David MRN: [id number] Date of Birth: 08/13/1941 Referring Provider: Dr. Riley Kill   Encounter Date: 12/14/2017  OT End of Session - 12/14/17 1628    Visit Number  9    Number of Visits  16    Date for OT Re-Evaluation  12/27/17    Authorization Type  medicare    Authorization Time Period  60 days    OT Start Time  1315    OT Stop Time  1359    OT Time Calculation (min)  44 min    Activity Tolerance  Patient tolerated treatment well       Past Medical History:  Diagnosis Date  . Acute blood loss anemia   . Anemia   . Arthritis    "legs, knees" (01/07/2016)  . Arthritis of right hand 01/11/2017  . Diabetes mellitus (HCC)   . Diastolic dysfunction   . Dysarthria, post-stroke   . Dysphagia, post-stroke   . Facial droop   . GERD (gastroesophageal reflux disease)    no meds  . High cholesterol   . History of blood transfusion   . Hyperlipidemia   . Hypertension   . Hypokalemia   . Left basal ganglia embolic stroke (HCC) 07/18/2017  . Osteoarthritis of right knee 01/06/2016  . Seasonal allergies   . Shortness of breath dyspnea    if too active  . Stroke (HCC) 07/16/2017  . Syncope    passed out and was hospitalized for a couple of days  . Type II diabetes mellitus (HCC)    does not take medicine diet controlled    Past Surgical History:  Procedure Laterality Date  . BUNIONECTOMY Bilateral   . CATARACT EXTRACTION W/ INTRAOCULAR LENS  IMPLANT, BILATERAL Bilateral   . INGUINAL HERNIA REPAIR Right   . KNEE ARTHROSCOPY WITH EXCISION BAKER'S CYST Right   . MR LOWER LEG LEFT ([facility] HX) Left    after a break  . PARTIAL KNEE ARTHROPLASTY Right 01/06/2016   Procedure: RIGHT UNICOMPARTMENTAL KNEE ARTHROPLASTY;  Surgeon: Kathryne Hitch, MD;  Location: [facility]  OR;  Service: Orthopedics;  Laterality: Right;  . VEIN LIGATION AND STRIPPING Bilateral     There were no vitals filed for this visit.  Subjective Assessment - 12/14/17 1321    Subjective   I am surprised that I can do that with my hand    Pertinent History  L BG CVA 07/16/2017.  Depression, HTN, HLD, DM, peripheral neuropathy.      Patient Stated Goals  to use my arm better    Currently in Pain?  No/denies                   OT Treatments/Exercises (OP) - 12/14/17 0001      Neurological Re-education Exercises   Other Exercises 1  Neuro re ed in supine to address unilateral open chain mid to beginning high reach incoporating grasp and release as well as pinch and release.  Pt requires cues to use R hand fully due to R inattention. Transitioned into sitting and pt able to flip large playing cards one a time with rest breaks during the activity.  Pt also arrived today using R hand to carry her purse and reports that she is using her right hand during ADL and IADL tasks  OT Short Term Goals - 12/14/17 1627      OT SHORT TERM GOAL #1   Title  Pt will be mod I with HEP for RUE - 11/29/2017    Status  Achieved      OT SHORT TERM GOAL #2   Title  Pt will rate pain no greater than 3/10 for PROM for shoulder flexion to 100* in supine to allow for adequate ROM for self care activities.     Status  Achieved      OT SHORT TERM GOAL #3   Title  Pt will demonstrate ability to grasp cylindrical light weight object in R hand during light manipulation of item with L hand.     Status  Achieved      OT SHORT TERM GOAL #4   Title  Pt will demonstrate ability for mod I for bathing at shower level    Status  Achieved        OT Long Term Goals - 12/14/17 1627      OT LONG TERM GOAL #1   Title  Pt will be mod I with upgraded HEP prn - 12/27/2017    Status  On-going      OT LONG TERM GOAL #2   Title  Pt will rate pain in R shoulder no greater than 3/10 with functional  use of RUE as stabilizer/occassional gross assist    Status  Achieved      OT LONG TERM GOAL #3   Title  Pt will demonstrate ability to use RUE as stabilizer at least 50% of the time with basic self care activities.     Status  Achieved      OT LONG TERM GOAL #4   Title  Pt will demonstrate shoulder flexion for RUE to at least 50* in preparation for low reach    Status  On-going            Plan - 12/14/17 1628    Clinical Impression Statement  Pt progressing toward goals - pt with slowly improving attention to R hand as well as improving functional use of RUE    Occupational Profile and client history currently impacting functional performance  PMH:  Depression, HTN, HLD, DM, peripheral neuropathy.      Occupational performance deficits (Please refer to evaluation for details):  ADL's;IADL's;Leisure;Social Participation    Rehab Potential  Fair    Current Impairments/barriers affecting progress:  severity of UE deficits, R inattention    OT Frequency  2x / week    OT Duration  8 weeks    OT Treatment/Interventions  Self-care/ADL training;Cryotherapy;Moist Heat;Electrical Stimulation;DME and/or AE instruction;Neuromuscular education;Therapeutic exercise;Functional Mobility Training;Manual Therapy;Passive range of motion;Splinting;Therapeutic activities;Cognitive remediation/compensation;Patient/family education;Balance training    Plan  manual therapy, NMR for RUE/trunk,  add to HEP as possible, functional use of R hand     Consulted and Agree with Plan of Care  Patient       Patient will benefit from skilled therapeutic intervention in order to improve the following deficits and impairments:  Abnormal gait, Decreased activity tolerance, Decreased cognition, Decreased balance, Decreased knowledge of use of DME, Decreased range of motion, Decreased mobility, Decreased strength, Increased edema, Difficulty walking, Impaired UE functional use, Impaired tone, Impaired sensation,  Pain  Visit Diagnosis: Muscle weakness (generalized)  Spastic hemiplegia of right dominant side as late effect of cerebrovascular disease, unspecified cerebrovascular disease type (HCC)  Unsteadiness on feet  Stiffness of right shoulder, not elsewhere classified  Abnormal posture  Stiffness of right hand, not elsewhere classified  Pain of right shoulder joint on movement  Pain in right hand  Other symptoms and signs involving cognitive functions following cerebral infarction    Problem List Patient Active Problem List   Diagnosis Date Noted  . Adhesive capsulitis of right shoulder 11/28/2017  . Left basal ganglia embolic stroke (HCC) 07/18/2017  . Facial droop   . Right sided weakness   . Diabetes mellitus (HCC)   . Diastolic dysfunction   . Dysphagia, post-stroke   . Dysarthria, post-stroke   . Hypokalemia   . Acute blood loss anemia   . Hyperlipidemia   . Stroke (HCC) 07/16/2017  . Hypertension 07/16/2017  . Arthritis of right hand 01/11/2017  . Osteoarthritis of right knee 01/06/2016  . Status post right partial knee replacement 01/06/2016    Norton Pastel, OTR/L 12/14/2017, 4:31 PM  [facility] [address] [place], [place], [address] Phone: [phone]   Fax:  [phone]  Name: Hatley Henegar MRN: [id number] Date of Birth: 11-Apr-1941

## 2017-12-14 NOTE — Therapy (Signed)
[facility] [address] [place], [place], [address] Phone: [phone]   Fax:  [phone]  Physical Therapy Treatment  Patient Details  Name: Kristi David MRN: [id number] Date of Birth: 08/25/1941 Referring Provider: Dr. Naaman Plummer   Encounter Date: 12/14/2017  PT End of Session - 12/14/17 1608    Visit Number  11    Number of Visits  18    Date for PT Re-Evaluation  12/30/17    Authorization Type  Medicare     Authorization Time Period  11/01/2017----12/30/2017    PT Start Time  1401    PT Stop Time  1445    PT Time Calculation (min)  44 min    Equipment Utilized During Treatment  Gait belt    Activity Tolerance  Patient tolerated treatment well    Behavior During Therapy  [facility] for tasks assessed/performed       Past Medical History:  Diagnosis Date  . Acute blood loss anemia   . Anemia   . Arthritis    "legs, knees" (01/07/2016)  . Arthritis of right hand 01/11/2017  . Diabetes mellitus ([place])   . Diastolic dysfunction   . Dysarthria, post-stroke   . Dysphagia, post-stroke   . Facial droop   . GERD (gastroesophageal reflux disease)    no meds  . High cholesterol   . History of blood transfusion   . Hyperlipidemia   . Hypertension   . Hypokalemia   . Left basal ganglia embolic stroke ([place]) 29/51/8841  . Osteoarthritis of right knee 01/06/2016  . Seasonal allergies   . Shortness of breath dyspnea    if too active  . Stroke ([place]) 07/16/2017  . Syncope    passed out and was hospitalized for a couple of days  . Type II diabetes mellitus ([place])    does not take medicine diet controlled    Past Surgical History:  Procedure Laterality Date  . BUNIONECTOMY Bilateral   . CATARACT EXTRACTION W/ INTRAOCULAR LENS  IMPLANT, BILATERAL Bilateral   . INGUINAL HERNIA REPAIR Right   . KNEE ARTHROSCOPY WITH EXCISION BAKER'S CYST Right   . MR LOWER LEG LEFT ([place] HX) Left    after a break  . PARTIAL KNEE ARTHROPLASTY  Right 01/06/2016   Procedure: RIGHT UNICOMPARTMENTAL KNEE ARTHROPLASTY;  Surgeon: Mcarthur Rossetti, MD;  Location: [facility];  Service: Orthopedics;  Laterality: Right;  . VEIN LIGATION AND STRIPPING Bilateral     There were no vitals filed for this visit.  Subjective Assessment - 12/14/17 1403    Subjective  Pt reports no falls, no pain, and no changes in medications.     Limitations  Walking;House hold activities    Patient Stated Goals  pt wants to use R hand more efficently and walk without use of AD in community     Currently in Pain?  No/denies         [facility] Adult PT Treatment/Exercise - 12/14/17 1703      Transfers   Transfers  Sit to Stand;Stand to Sit    Sit to Stand  5: Supervision;From chair/3-in-1;With upper extremity assist;With armrests;From bed    Stand to Sit  5: Supervision;With upper extremity assist;To chair/3-in-1;With armrests;To bed    Comments  x 4 reps during seated rest breaks.       Ambulation/Gait   Ambulation/Gait  Yes    Ambulation/Gait Assistance  5: Supervision    Ambulation/Gait Assistance Details  --    Ambulation Distance (Feet)  750 Feet    Assistive device  None    Gait Pattern  Step-through pattern;Decreased arm swing - right;Decreased dorsiflexion - right;Right hip hike;Lateral trunk lean to left;Poor foot clearance - right;Decreased stance time - right;Decreased stride length;Decreased hip/knee flexion - right;Decreased weight shift to right;Right genu recurvatum;Abducted- right;Shuffle    Ambulation Surface  Level;Indoor    Stairs  Yes    Stairs Assistance  5: Supervision    Stairs Assistance Details (indicate cue type and reason)  Pt required verbal and demo cues for sequencing and use of single rail. Pt had a tendency to want to descend slightly rotated to the right side towards holding rail. Pt was able to correct this after cues. Pt also varied from alternating pattern to step to pattern during both ascending and descending. Pt was not  able to correct this after verbal cues. SPTA provided supervision assistance only.     Stair Management Technique  One rail Left;Alternating pattern;Forwards;Step to pattern;Other (comment) performed x 5 times each    Number of Stairs  4    Height of Stairs  6    Door Management  --    Ramp  5: Supervision;Other (comment) x 5 times each     Ramp Details (indicate cue type and reason)  Pt with decreased forward gait speed for both ascending and descending ramp. Noted slight lateral lean/sway during task in attempt to maintain upright posture. Supervision assistance provided.      Curb  Other (comment);5: Supervision;6: Modified independent (Device/increase time);4: Min assist x 5 time each    Curb Details (indicate cue type and reason)  Pt required increased time to plan steps for descending and ascending curb. Pt required cues for step length, eccentric lowering, and upright posture.     Gait Comments  Pt ambulated without use of an AD. Pt performed verbally critical thinkng for dual tasks.        High Level Balance   High Level Balance Activities  Marching forwards;Marching backwards;Tandem walking;Side stepping;Backward walking    High Level Balance Comments  Performed on red and blue mats along counter x 3 reps each and while holding a 2# weighted ball: Forward walking, backward walking, side stepping, and marching. Pt required verbal and demo cues for new tasks, verbal cues for large step, and high marching; no UE support used. Followed by tandem walking forward and backwards, heel walking, toe walking; with single UE support. SPTA provided supervision assistance only. BOSU step ups in // bars 10 x each foot with Bilateral UE support      Knee/Hip Exercises: Aerobic   Stepper  10 minutes            PT Short Term Goals - 11/24/17 1540      PT SHORT TERM GOAL #1   Title  Pt will deomonstate independence with initial HEP (All STGs Target Date 11/29/2017)    Baseline  11/24/17- Pt verbally  expressed confidence with HEP independence and initial HEP.     Status  Achieved    Target Date  11/29/17      PT SHORT TERM GOAL #2   Title  pt will demonstrate tandem walking 15steps min guard     Baseline  11/24/17: 9 steps with min guard, single UE assistance needed afterwards.    Status  Partially Met    Target Date  11/29/17      PT SHORT TERM GOAL #3   Title  Pt will demonstrate stepping over objects 6-8" tall  without AD with supervision  in order to reduce falls risk     Baseline  11/24/17: Pt demonstrated ability to step over objects 6-8" without AD with supervision in order to reduce falls risk.    Status  Achieved    Target Date  11/29/17      PT SHORT TERM GOAL #4   Title  pt will ambulate 600' without AD performing naming task with continual gait with supervision.     Baseline  11/24/17: Pt able to ambulate 650' without AD performing naming task with continual gait with supervision.     Status  Achieved    Target Date  11/29/17      PT SHORT TERM GOAL #5   Title  pain in right knee with daily activities decreased >/= 75%    Baseline  11/24/2017: Pt verbally expressed pain in right knee with daily activities decreased to >/= 25%    Status  Achieved        PT Long Term Goals - 11/16/17 1625      PT LONG TERM GOAL #1   Title  pt will demonstrate independence in ongoing HEP/ Fitness program in order to continue to maintain improvement made in PT (All LTGs Target Date 12/31/2017)    Time  2    Period  Months    Status  On-going    Target Date  12/31/17      PT LONG TERM GOAL #2   Title  Pt will demonstate a standard Timed Up-Go score of </=14.0" and a cognitive TUG score of</=15.0"    in order to improve functional gait     Baseline  standard-18.20" Cognitive 36.91"    Time  2    Period  Months    Status  On-going    Target Date  12/31/17      PT LONG TERM GOAL #3   Title  Pt will demonstate an Functional Gait Assessment score  >/= 24/30 in order to furhter improve  funtional gait     Time  2    Period  Months    Status  On-going    Target Date  12/31/17      PT LONG TERM GOAL #4   Title  pt dill demonstrate gait speed of >/=2.62 ft/sec safely in order to indicate a safe community level ambulator    Baseline  2.57f/s    Time  2    Period  Months    Status  On-going    Target Date  12/31/17      PT LONG TERM GOAL #5   Title  pt will negotiate ramps,curbs,grass, and short paved distance without device modified independent to indicate ability for community access.     Baseline  min assist on curbs, supervision on ramps     Time  2    Period  Months    Status  On-going    Target Date  12/31/17      PT LONG TERM GOAL #6   Title  Pt will ambulate >/= 1000' while maintaining light conversation with fluent gait without device modified independent to enbable community mobility.     Time  2    Period  Months    Status  On-going    Target Date  12/31/17         Plan - 12/14/17 1724    Clinical Impression Statement  Today's skilled session focused on progression of dynamic level balance exercises on uneven surfaces, bilateral  LE strengthening, and endurance training of stairs, ramps, curbs, and gait distance training with dual tasks. Pt continues to make progress towards LTG's and would benefit from continued PT services to reach unmet goals.     History and Personal Factors relevant to plan of care:  basal ganglia, CVA, HTN, DM 2, peripheral neuropathy    Clinical Presentation  Evolving    Clinical Presentation due to:  high fall risk, lives alone    Clinical Decision Making  Moderate    Rehab Potential  Good    PT Frequency  2x / week    PT Treatment/Interventions  ADLs/Self Care Home Management;Aquatic Therapy;Biofeedback;Cryotherapy;Electrical Stimulation;Moist Heat;Traction;Ultrasound;DME Instruction;Stair training;Functional mobility training;Therapeutic activities;Therapeutic exercise;Balance training;Neuromuscular re-education;Cognitive  remediation;Patient/family education;Orthotic Fit/Training;Manual techniques;Passive range of motion;Taping    PT Next Visit Plan  continue to address dynamic walking balance with dual task gait and uneven surfaces.     Consulted and Agree with Plan of Care  Patient       Patient will benefit from skilled therapeutic intervention in order to improve the following deficits and impairments:  Abnormal gait, Decreased balance, Decreased mobility, Difficulty walking, Decreased cognition, Decreased activity tolerance, Decreased safety awareness, Decreased strength, Impaired UE functional use, Decreased endurance, Postural dysfunction  Visit Diagnosis: Muscle weakness (generalized)  Spastic hemiplegia of right dominant side as late effect of cerebrovascular disease, unspecified cerebrovascular disease type ([place])  Unsteadiness on feet     Problem List Patient Active Problem List   Diagnosis Date Noted  . Adhesive capsulitis of right shoulder 11/28/2017  . Left basal ganglia embolic stroke ([place]) 34/35/6861  . Facial droop   . Right sided weakness   . Diabetes mellitus ([place])   . Diastolic dysfunction   . Dysphagia, post-stroke   . Dysarthria, post-stroke   . Hypokalemia   . Acute blood loss anemia   . Hyperlipidemia   . Stroke ([place]) 07/16/2017  . Hypertension 07/16/2017  . Arthritis of right hand 01/11/2017  . Osteoarthritis of right knee 01/06/2016  . Status post right partial knee replacement 01/06/2016    Carlena Sax, SPTA  12/14/2017, 5:29 PM  [facility] [address] [place], [place], [address] Phone: [phone]   Fax:  [phone]  Name: Kristi David MRN: [id number] Date of Birth: 1941/06/01

## 2017-12-19 ENCOUNTER — Ambulatory Visit: Payer: Medicare Other | Admitting: Occupational Therapy

## 2017-12-19 ENCOUNTER — Encounter: Payer: Self-pay | Admitting: Physical Therapy

## 2017-12-19 ENCOUNTER — Ambulatory Visit: Payer: Medicare Other | Admitting: Physical Therapy

## 2017-12-19 ENCOUNTER — Encounter: Payer: Self-pay | Admitting: Occupational Therapy

## 2017-12-19 DIAGNOSIS — M6281 Muscle weakness (generalized): Secondary | ICD-10-CM

## 2017-12-19 DIAGNOSIS — R293 Abnormal posture: Secondary | ICD-10-CM

## 2017-12-19 DIAGNOSIS — M25641 Stiffness of right hand, not elsewhere classified: Secondary | ICD-10-CM

## 2017-12-19 DIAGNOSIS — I69318 Other symptoms and signs involving cognitive functions following cerebral infarction: Secondary | ICD-10-CM

## 2017-12-19 DIAGNOSIS — M79641 Pain in right hand: Secondary | ICD-10-CM

## 2017-12-19 DIAGNOSIS — M25611 Stiffness of right shoulder, not elsewhere classified: Secondary | ICD-10-CM | POA: Diagnosis not present

## 2017-12-19 DIAGNOSIS — M25511 Pain in right shoulder: Secondary | ICD-10-CM

## 2017-12-19 DIAGNOSIS — I69951 Hemiplegia and hemiparesis following unspecified cerebrovascular disease affecting right dominant side: Secondary | ICD-10-CM

## 2017-12-19 DIAGNOSIS — R2681 Unsteadiness on feet: Secondary | ICD-10-CM

## 2017-12-19 NOTE — Therapy (Signed)
[facility] [address] [place], [place], [address] Phone: [phone]   Fax:  [phone]  Physical Therapy Treatment  Patient Details  Name: Kristi David MRN: [id number] Date of Birth: 1940-11-13 Referring Provider: Dr. Naaman Plummer   Encounter Date: 12/19/2017  PT End of Session - 12/19/17 1742    Visit Number  12    Number of Visits  18    Date for PT Re-Evaluation  12/30/17    Authorization Type  Medicare     Authorization Time Period  11/01/2017----12/30/2017    PT Start Time  1447    PT Stop Time  1530    PT Time Calculation (min)  43 min    Equipment Utilized During Treatment  Gait belt    Activity Tolerance  Patient tolerated treatment well    Behavior During Therapy  [facility] for tasks assessed/performed       Past Medical History:  Diagnosis Date  . Acute blood loss anemia   . Anemia   . Arthritis    "legs, knees" (01/07/2016)  . Arthritis of right hand 01/11/2017  . Diabetes mellitus ([place])   . Diastolic dysfunction   . Dysarthria, post-stroke   . Dysphagia, post-stroke   . Facial droop   . GERD (gastroesophageal reflux disease)    no meds  . High cholesterol   . History of blood transfusion   . Hyperlipidemia   . Hypertension   . Hypokalemia   . Left basal ganglia embolic stroke ([place]) 03/50/0938  . Osteoarthritis of right knee 01/06/2016  . Seasonal allergies   . Shortness of breath dyspnea    if too active  . Stroke ([place]) 07/16/2017  . Syncope    passed out and was hospitalized for a couple of days  . Type II diabetes mellitus ([place])    does not take medicine diet controlled    Past Surgical History:  Procedure Laterality Date  . BUNIONECTOMY Bilateral   . CATARACT EXTRACTION W/ INTRAOCULAR LENS  IMPLANT, BILATERAL Bilateral   . INGUINAL HERNIA REPAIR Right   . KNEE ARTHROSCOPY WITH EXCISION BAKER'S CYST Right   . MR LOWER LEG LEFT ([facility] HX) Left    after a break  . PARTIAL KNEE ARTHROPLASTY  Right 01/06/2016   Procedure: RIGHT UNICOMPARTMENTAL KNEE ARTHROPLASTY;  Surgeon: Mcarthur Rossetti, MD;  Location: [facility];  Service: Orthopedics;  Laterality: Right;  . VEIN LIGATION AND STRIPPING Bilateral     There were no vitals filed for this visit.  Subjective Assessment - 12/19/17 1449    Subjective  Pt reports no falls, no pain, and no changes in medications. Pt also stated, "I was able to walk up some hills in a new area of [place] this weekend with no problems."    Limitations  Walking;House hold activities    Patient Stated Goals  pt wants to use R hand more efficently and walk without use of AD in community     Currently in Pain?  No/denies          [facility] Adult PT Treatment/Exercise - 12/19/17 1521      Transfers   Transfers  Sit to Stand;Stand to Sit    Sit to Stand  5: Supervision;From chair/3-in-1;With upper extremity assist;With armrests;From bed    Stand to Sit  5: Supervision;With upper extremity assist;To chair/3-in-1;With armrests;To bed    Comments  x 4 reps during seated rest breaks.       Ambulation/Gait   Ambulation/Gait  Yes  Ambulation/Gait Assistance  5: Supervision    Ambulation Distance (Feet)  800 Feet plus 50 feet x 2 during activities.    Assistive device  None    Gait Pattern  Step-through pattern;Decreased arm swing - right;Decreased dorsiflexion - right;Right hip hike;Lateral trunk lean to left;Poor foot clearance - right;Decreased stance time - right;Decreased stride length;Decreased hip/knee flexion - right;Decreased weight shift to right;Right genu recurvatum;Abducted- right;Shuffle    Ambulation Surface  Level;Indoor    Gait Comments  Pt ambulated without use of an AD. Pt performed verbally critical thinkng for dual tasks and holding a #2 weighted ball.        High Level Balance   High Level Balance Activities  Marching forwards;Marching backwards;Tandem walking;Side stepping;Backward walking    High Level Balance Comments  Performed  on red and blue mats along counter x 3 reps each and while holding a 2# weighted ball: Forward walking, backward walking, side stepping, and marching. Pt required verbal cues for large steps both forward and backwards, and high marching; no UE support used. Followed by tandem walking forward and backwards, heel walking, toe walking; with single UE support. SPTA provided supervision assistance only.      Lumbar Exercises: Standing   Other Standing Lumbar Exercises  Alternating step up taps x 10 reps bilaterally using 4 inch step; no UE assistance. Supervision cues required only. Minimal lateral swaying during task but no loss of balance requiring steppage strategy to regain upright posture.       Knee/Hip Exercises: Aerobic   Stepper  10 minutes at level 2. Using UE's and LE's.  Goals of >/= 50 steps per minute for strengthening and activity tolerance         PT Short Term Goals - 11/24/17 1540      PT SHORT TERM GOAL #1   Title  Pt will deomonstate independence with initial HEP (All STGs Target Date 11/29/2017)    Baseline  11/24/17- Pt verbally expressed confidence with HEP independence and initial HEP.     Status  Achieved    Target Date  11/29/17      PT SHORT TERM GOAL #2   Title  pt will demonstrate tandem walking 15steps min guard     Baseline  11/24/17: 9 steps with min guard, single UE assistance needed afterwards.    Status  Partially Met    Target Date  11/29/17      PT SHORT TERM GOAL #3   Title  Pt will demonstrate stepping over objects 6-8" tall without AD with supervision  in order to reduce falls risk     Baseline  11/24/17: Pt demonstrated ability to step over objects 6-8" without AD with supervision in order to reduce falls risk.    Status  Achieved    Target Date  11/29/17      PT SHORT TERM GOAL #4   Title  pt will ambulate 600' without AD performing naming task with continual gait with supervision.     Baseline  11/24/17: Pt able to ambulate 650' without AD performing  naming task with continual gait with supervision.     Status  Achieved    Target Date  11/29/17      PT SHORT TERM GOAL #5   Title  pain in right knee with daily activities decreased >/= 75%    Baseline  11/24/2017: Pt verbally expressed pain in right knee with daily activities decreased to >/= 25%    Status  Achieved  PT Long Term Goals - 11/16/17 1625      PT LONG TERM GOAL #1   Title  pt will demonstrate independence in ongoing HEP/ Fitness program in order to continue to maintain improvement made in PT (All LTGs Target Date 12/31/2017)    Time  2    Period  Months    Status  On-going    Target Date  12/31/17      PT LONG TERM GOAL #2   Title  Pt will demonstate a standard Timed Up-Go score of </=14.0" and a cognitive TUG score of</=15.0"    in order to improve functional gait     Baseline  standard-18.20" Cognitive 36.91"    Time  2    Period  Months    Status  On-going    Target Date  12/31/17      PT LONG TERM GOAL #3   Title  Pt will demonstate an Functional Gait Assessment score  >/= 24/30 in order to furhter improve funtional gait     Time  2    Period  Months    Status  On-going    Target Date  12/31/17      PT LONG TERM GOAL #4   Title  pt dill demonstrate gait speed of >/=2.62 ft/sec safely in order to indicate a safe community level ambulator    Baseline  2.71f/s    Time  2    Period  Months    Status  On-going    Target Date  12/31/17      PT LONG TERM GOAL #5   Title  pt will negotiate ramps,curbs,grass, and short paved distance without device modified independent to indicate ability for community access.     Baseline  min assist on curbs, supervision on ramps     Time  2    Period  Months    Status  On-going    Target Date  12/31/17      PT LONG TERM GOAL #6   Title  Pt will ambulate >/= 1000' while maintaining light conversation with fluent gait without device modified independent to enbable community mobility.     Time  2    Period  Months     Status  On-going    Target Date  12/31/17            Plan - 12/19/17 1743    Clinical Impression Statement  Today's session focused on progression of gait distance with dual task and holding a weighted #2 ball to simulate community ambulation, dynamic walking while on uneven surfaces, using stepper for endurance, and bilateral LE strengthening. Pt continues to make steady progression towards LTG's and would benefit from continued PT interventions towards unmet PT goals.     History and Personal Factors relevant to plan of care:  basal ganglia, CVA, HTN, DM2, peripheral neuropathy    Clinical Presentation  Evolving    Clinical Presentation due to:  high fall risk, lives alone    Clinical Decision Making  Moderate    Rehab Potential  Good    PT Frequency  2x / week    PT Duration  Other (comment)    PT Treatment/Interventions  ADLs/Self Care Home Management;Aquatic Therapy;Biofeedback;Cryotherapy;Electrical Stimulation;Moist Heat;Traction;Ultrasound;DME Instruction;Stair training;Functional mobility training;Therapeutic activities;Therapeutic exercise;Balance training;Neuromuscular re-education;Cognitive remediation;Patient/family education;Orthotic Fit/Training;Manual techniques;Passive range of motion;Taping    PT Next Visit Plan  continue to address dynamic walking balance with dual task gait and uneven surfaces.     Consulted and  Agree with Plan of Care  Patient       Patient will benefit from skilled therapeutic intervention in order to improve the following deficits and impairments:  Abnormal gait, Decreased balance, Decreased mobility, Difficulty walking, Decreased cognition, Decreased activity tolerance, Decreased safety awareness, Decreased strength, Impaired UE functional use, Decreased endurance, Postural dysfunction  Visit Diagnosis: Muscle weakness (generalized)  Spastic hemiplegia of right dominant side as late effect of cerebrovascular disease, unspecified  cerebrovascular disease type ([place])  Unsteadiness on feet     Problem List Patient Active Problem List   Diagnosis Date Noted  . Adhesive capsulitis of right shoulder 11/28/2017  . Left basal ganglia embolic stroke ([place]) 29/10/1113  . Facial droop   . Right sided weakness   . Diabetes mellitus ([place])   . Diastolic dysfunction   . Dysphagia, post-stroke   . Dysarthria, post-stroke   . Hypokalemia   . Acute blood loss anemia   . Hyperlipidemia   . Stroke ([place]) 07/16/2017  . Hypertension 07/16/2017  . Arthritis of right hand 01/11/2017  . Osteoarthritis of right knee 01/06/2016  . Status post right partial knee replacement 01/06/2016    Carlena Sax, SPTA 12/19/2017, 5:47 PM  [facility] [address] [place], [place], [address] Phone: [phone]   Fax:  [phone]  Name: Leanda Padmore MRN: [id number] Date of Birth: [birth year]

## 2017-12-19 NOTE — Therapy (Signed)
[facility] [address] [place], [place], [address] Phone: [phone]   Fax:  [phone]  Occupational Therapy Treatment  Patient Details  Name: Kristi David MRN: [id number] Date of Birth: [birth year] Referring Provider: Dr. Riley Kill   Encounter Date: 12/19/2017  OT End of Session - 12/19/17 1700    Visit Number  10    Number of Visits  16    Date for OT Re-Evaluation  12/27/17    Authorization Type  medicare    Authorization Time Period  60 days    OT Start Time  1402    OT Stop Time  1445    OT Time Calculation (min)  43 min    Activity Tolerance  Patient tolerated treatment well       Past Medical History:  Diagnosis Date  . Acute blood loss anemia   . Anemia   . Arthritis    "legs, knees" (01/07/2016)  . Arthritis of right hand 01/11/2017  . Diabetes mellitus ([facility])   . Diastolic dysfunction   . Dysarthria, post-stroke   . Dysphagia, post-stroke   . Facial droop   . GERD (gastroesophageal reflux disease)    no meds  . High cholesterol   . History of blood transfusion   . Hyperlipidemia   . Hypertension   . Hypokalemia   . Left basal ganglia embolic stroke ([facility]) 07/18/2017  . Osteoarthritis of right knee 01/06/2016  . Seasonal allergies   . Shortness of breath dyspnea    if too active  . Stroke ([facility]) 07/16/2017  . Syncope    passed out and was hospitalized for a couple of days  . Type II diabetes mellitus ([facility])    does not take medicine diet controlled    Past Surgical History:  Procedure Laterality Date  . BUNIONECTOMY Bilateral   . CATARACT EXTRACTION W/ INTRAOCULAR LENS  IMPLANT, BILATERAL Bilateral   . INGUINAL HERNIA REPAIR Right   . KNEE ARTHROSCOPY WITH EXCISION BAKER'S CYST Right   . MR LOWER LEG LEFT ([facility] HX) Left    after a break  . PARTIAL KNEE ARTHROPLASTY Right 01/06/2016   Procedure: RIGHT UNICOMPARTMENTAL KNEE ARTHROPLASTY;  Surgeon: Kathryne Hitch, MD;  Location: [facility]  OR;  Service: Orthopedics;  Laterality: Right;  . VEIN LIGATION AND STRIPPING Bilateral     There were no vitals filed for this visit.  Subjective Assessment - 12/19/17 1411    Subjective   I want to be able to pick up things with my right hand    Pertinent History  L BG CVA 07/16/2017.  Depression, HTN, HLD, DM, peripheral neuropathy.      Patient Stated Goals  to use my arm better    Currently in Pain?  No/denies                   OT Treatments/Exercises (OP) - 12/19/17 1653      Neurological Re-education Exercises   Other Exercises 1  Neuro re ed to address functional use of RUE with grasping, pinching, manipulating and release as well as low reach with functional tasks. Pt has greater ability to use her R hand than she does primarily due to R inattention and learned non use.  Pt able to unzip and then take all items out of her purse with her RUE. Pt then able to put all items back into her purse and zip purse using her R hand as dominant.  Also addressed bilateral use of UE's  with functional tasks. Even with cueing and focus of session on using R hand pt continues to need moderate cueing to use her R hand when doing tasks.  Pt also requires cues to use hand to full potential due to R inattention.                OT Short Term Goals - 12/19/17 1656      OT SHORT TERM GOAL #1   Title  Pt will be mod I with HEP for RUE - 11/29/2017    Status  Achieved      OT SHORT TERM GOAL #2   Title  Pt will rate pain no greater than 3/10 for PROM for shoulder flexion to 100* in supine to allow for adequate ROM for self care activities.     Status  Achieved      OT SHORT TERM GOAL #3   Title  Pt will demonstrate ability to grasp cylindrical light weight object in R hand during light manipulation of item with L hand.     Status  Achieved      OT SHORT TERM GOAL #4   Title  Pt will demonstrate ability for mod I for bathing at shower level    Status  Achieved        OT Long  Term Goals - 12/19/17 1656      OT LONG TERM GOAL #1   Title  Pt will be mod I with upgraded HEP prn - 12/27/2017    Status  On-going      OT LONG TERM GOAL #2   Title  Pt will rate pain in R shoulder no greater than 3/10 with functional use of RUE as stabilizer/occassional gross assist    Status  Achieved      OT LONG TERM GOAL #3   Title  Pt will demonstrate ability to use RUE as stabilizer at least 50% of the time with basic self care activities.     Status  Achieved      OT LONG TERM GOAL #4   Title  Pt will demonstrate shoulder flexion for RUE to at least 50* in preparation for low reach    Status  Achieved            Plan - 12/19/17 1657    Clinical Impression Statement  Pt progressing toward goals. Will continue to focus on functional use of R hand.    Occupational Profile and client history currently impacting functional performance  PMH:  Depression, HTN, HLD, DM, peripheral neuropathy.      Occupational performance deficits (Please refer to evaluation for details):  ADL's;IADL's;Leisure;Social Participation    Rehab Potential  Fair    Current Impairments/barriers affecting progress:  severity of UE deficits, R inattention    OT Frequency  2x / week    OT Duration  8 weeks    OT Treatment/Interventions  Self-care/ADL training;Cryotherapy;Moist Heat;Electrical Stimulation;DME and/or AE instruction;Neuromuscular education;Therapeutic exercise;Functional Mobility Training;Manual Therapy;Passive range of motion;Splinting;Therapeutic activities;Cognitive remediation/compensation;Patient/family education;Balance training    Plan  add to HEP as possible, functional use of R hand , NMR for RUE,        Patient will benefit from skilled therapeutic intervention in order to improve the following deficits and impairments:  Abnormal gait, Decreased activity tolerance, Decreased cognition, Decreased balance, Decreased knowledge of use of DME, Decreased range of motion, Decreased  mobility, Decreased strength, Increased edema, Difficulty walking, Impaired UE functional use, Impaired tone, Impaired sensation, Pain  Visit Diagnosis:  Muscle weakness (generalized)  Spastic hemiplegia of right dominant side as late effect of cerebrovascular disease, unspecified cerebrovascular disease type ([facility])  Unsteadiness on feet  Stiffness of right shoulder, not elsewhere classified  Abnormal posture  Stiffness of right hand, not elsewhere classified  Pain of right shoulder joint on movement  Pain in right hand  Other symptoms and signs involving cognitive functions following cerebral infarction    Problem List Patient Active Problem List   Diagnosis Date Noted  . Adhesive capsulitis of right shoulder 11/28/2017  . Left basal ganglia embolic stroke ([facility]) 07/18/2017  . Facial droop   . Right sided weakness   . Diabetes mellitus ([facility])   . Diastolic dysfunction   . Dysphagia, post-stroke   . Dysarthria, post-stroke   . Hypokalemia   . Acute blood loss anemia   . Hyperlipidemia   . Stroke ([facility]) 07/16/2017  . Hypertension 07/16/2017  . Arthritis of right hand 01/11/2017  . Osteoarthritis of right knee 01/06/2016  . Status post right partial knee replacement 01/06/2016    Norton Pastel, OTR/L 12/19/2017, 5:02 PM  [facility] [address] [place], [place], [address] Phone: [phone]   Fax:  [phone]  Name: Kristi David MRN: [id number] Date of Birth: Aug 29, 1941

## 2017-12-22 ENCOUNTER — Ambulatory Visit: Payer: Medicare Other | Admitting: Physical Therapy

## 2017-12-22 ENCOUNTER — Encounter: Payer: Self-pay | Admitting: Occupational Therapy

## 2017-12-22 ENCOUNTER — Encounter: Payer: Self-pay | Admitting: Physical Therapy

## 2017-12-22 ENCOUNTER — Ambulatory Visit: Payer: Medicare Other | Admitting: Occupational Therapy

## 2017-12-22 DIAGNOSIS — R2681 Unsteadiness on feet: Secondary | ICD-10-CM | POA: Diagnosis not present

## 2017-12-22 DIAGNOSIS — M25641 Stiffness of right hand, not elsewhere classified: Secondary | ICD-10-CM

## 2017-12-22 DIAGNOSIS — M6281 Muscle weakness (generalized): Secondary | ICD-10-CM | POA: Diagnosis not present

## 2017-12-22 DIAGNOSIS — I69951 Hemiplegia and hemiparesis following unspecified cerebrovascular disease affecting right dominant side: Secondary | ICD-10-CM | POA: Diagnosis not present

## 2017-12-22 DIAGNOSIS — R293 Abnormal posture: Secondary | ICD-10-CM

## 2017-12-22 DIAGNOSIS — M25611 Stiffness of right shoulder, not elsewhere classified: Secondary | ICD-10-CM

## 2017-12-22 DIAGNOSIS — I69318 Other symptoms and signs involving cognitive functions following cerebral infarction: Secondary | ICD-10-CM

## 2017-12-22 DIAGNOSIS — M79641 Pain in right hand: Secondary | ICD-10-CM

## 2017-12-22 DIAGNOSIS — M25511 Pain in right shoulder: Secondary | ICD-10-CM

## 2017-12-22 NOTE — Therapy (Signed)
[facility] [address] [place], [place], [address] Phone: [phone]   Fax:  [phone]  Occupational Therapy Treatment  Patient Details  Name: Kristi NgHettie Lori MRN: [id number] Date of Birth: 11/10/1940 Referring Provider: Dr. Riley KillSwartz   Encounter Date: 12/22/2017  OT End of Session - 12/22/17 1644    Visit Number  11    Number of Visits  16    Date for OT Re-Evaluation  12/27/17    Authorization Type  medicare    Authorization Time Period  60 days    OT Start Time  1402    OT Stop Time  1445    OT Time Calculation (min)  43 min    Activity Tolerance  Patient tolerated treatment well       Past Medical History:  Diagnosis Date  . Acute blood loss anemia   . Anemia   . Arthritis    "legs, knees" (01/07/2016)  . Arthritis of right hand 01/11/2017  . Diabetes mellitus (HCC)   . Diastolic dysfunction   . Dysarthria, post-stroke   . Dysphagia, post-stroke   . Facial droop   . GERD (gastroesophageal reflux disease)    no meds  . High cholesterol   . History of blood transfusion   . Hyperlipidemia   . Hypertension   . Hypokalemia   . Left basal ganglia embolic stroke (HCC) 07/18/2017  . Osteoarthritis of right knee 01/06/2016  . Seasonal allergies   . Shortness of breath dyspnea    if too active  . Stroke (HCC) 07/16/2017  . Syncope    passed out and was hospitalized for a couple of days  . Type II diabetes mellitus (HCC)    does not take medicine diet controlled    Past Surgical History:  Procedure Laterality Date  . BUNIONECTOMY Bilateral   . CATARACT EXTRACTION W/ INTRAOCULAR LENS  IMPLANT, BILATERAL Bilateral   . INGUINAL HERNIA REPAIR Right   . KNEE ARTHROSCOPY WITH EXCISION BAKER'S CYST Right   . MR LOWER LEG LEFT ([facility] HX) Left    after a break  . PARTIAL KNEE ARTHROPLASTY Right 01/06/2016   Procedure: RIGHT UNICOMPARTMENTAL KNEE ARTHROPLASTY;  Surgeon: Kathryne Hitchhristopher Y Blackman, MD;  [facility];  Service: Orthopedics;  Laterality: Right;  . VEIN LIGATION AND STRIPPING Bilateral     There were no vitals filed for this visit.  Subjective Assessment - 12/22/17 1407    Subjective   I want to be able to use my R hand more    Pertinent History  L BG CVA 07/16/2017.  Depression, HTN, HLD, DM, peripheral neuropathy.      Patient Stated Goals  to use my arm better    Currently in Pain?  No/denies                   OT Treatments/Exercises (OP) - 12/22/17 1638      Neurological Re-education Exercises   Other Exercises 1  Neuro re ed to identify functional tasks with R hand that pt can do as HEP.  Pt with R inattention and is able to use R hand with cueing and visual attention to hand.  Pt needs cues and prompts to use R hand. Performance with all tasks improves rapidly with practice and repetition.  Encouraged pt to use her R hand for everything she can vs doing things with L hand because it is easier.  Pt given list of activities in addition to several others to incorporate  and use R hand functionally.              OT Education - 12/22/17 1641    Education provided  Yes    Education Details  Home activities program to increase use of R hand    Person(s) Educated  Patient    Methods  Explanation;Demonstration;Handout    Comprehension  Verbalized understanding;Returned demonstration       OT Short Term Goals - 12/22/17 1641      OT SHORT TERM GOAL #1   Title  Pt will be mod I with HEP for RUE - 11/29/2017    Status  Achieved      OT SHORT TERM GOAL #2   Title  Pt will rate pain no greater than 3/10 for PROM for shoulder flexion to 100* in supine to allow for adequate ROM for self care activities.     Status  Achieved      OT SHORT TERM GOAL #3   Title  Pt will demonstrate ability to grasp cylindrical light weight object in R hand during light manipulation of item with L hand.     Status  Achieved      OT SHORT TERM GOAL #4   Title  Pt will demonstrate  ability for mod I for bathing at shower level    Status  Achieved        OT Long Term Goals - 12/22/17 1643      OT LONG TERM GOAL #1   Title  Pt will be mod I with upgraded HEP prn - 12/27/2017    Status  On-going      OT LONG TERM GOAL #2   Title  Pt will rate pain in R shoulder no greater than 3/10 with functional use of RUE as stabilizer/occassional gross assist    Status  Achieved      OT LONG TERM GOAL #3   Title  Pt will demonstrate ability to use RUE as stabilizer at least 50% of the time with basic self care activities.     Status  Achieved      OT LONG TERM GOAL #4   Title  Pt will demonstrate shoulder flexion for RUE to at least 50* in preparation for low reach    Status  Achieved            Plan - 12/22/17 1643    Occupational Profile and client history currently impacting functional performance  PMH:  Depression, HTN, HLD, DM, peripheral neuropathy.      Occupational performance deficits (Please refer to evaluation for details):  ADL's;IADL's;Leisure;Social Participation    Rehab Potential  Fair    Current Impairments/barriers affecting progress:  severity of UE deficits, R inattention    OT Frequency  2x / week    OT Duration  8 weeks    OT Treatment/Interventions  Self-care/ADL training;Cryotherapy;Moist Heat;Electrical Stimulation;DME and/or AE instruction;Neuromuscular education;Therapeutic exercise;Functional Mobility Training;Manual Therapy;Passive range of motion;Splinting;Therapeutic activities;Cognitive remediation/compensation;Patient/family education;Balance training    Plan  add to HEP as possible, functional use of R hand , NMR for RUE,     Consulted and Agree with Plan of Care  Patient       Patient will benefit from skilled therapeutic intervention in order to improve the following deficits and impairments:  Abnormal gait, Decreased activity tolerance, Decreased cognition, Decreased balance, Decreased knowledge of use of DME, Decreased range of  motion, Decreased mobility, Decreased strength, Increased edema, Difficulty walking, Impaired UE functional use, Impaired tone, Impaired  sensation, Pain  Visit Diagnosis: Muscle weakness (generalized)  Spastic hemiplegia of right dominant side as late effect of cerebrovascular disease, unspecified cerebrovascular disease type (HCC)  Unsteadiness on feet  Stiffness of right shoulder, not elsewhere classified  Abnormal posture  Stiffness of right hand, not elsewhere classified  Pain of right shoulder joint on movement  Pain in right hand  Other symptoms and signs involving cognitive functions following cerebral infarction    Problem List Patient Active Problem List   Diagnosis Date Noted  . Adhesive capsulitis of right shoulder 11/28/2017  . Left basal ganglia embolic stroke (HCC) 07/18/2017  . Facial droop   . Right sided weakness   . Diabetes mellitus (HCC)   . Diastolic dysfunction   . Dysphagia, post-stroke   . Dysarthria, post-stroke   . Hypokalemia   . Acute blood loss anemia   . Hyperlipidemia   . Stroke (HCC) 07/16/2017  . Hypertension 07/16/2017  . Arthritis of right hand 01/11/2017  . Osteoarthritis of right knee 01/06/2016  . Status post right partial knee replacement 01/06/2016    Norton Pastel, OTR/L 12/22/2017, 4:54 PM  [facility] [address] [place], [place], [address] Phone: [phone]   Fax:  [phone]  Name: Bayyinah Dukeman MRN: [id number] Date of Birth: 15-Apr-1941

## 2017-12-22 NOTE — Patient Instructions (Signed)
Try these activities at home to get your right hand going:  1. Clean out your purse once a week - use your right hand and take every thing out and then put every thing back in.  2. Work on sliding pennies, nickels and quarters to the edge of the table and picking them up one at a time then make a pile with them.  Make sure your arm is supported on the table.    3.  Sit at a table with both hands over the table.  Practice throwing the small yellow ball back and forth. Make sure your left hand is actually throwing - don't let it get lazy.  You can also bounce the ball on the table and try and catch it.  You can also put your palm on the ball and roll it back forth and side to side.   4. Work on flipping cards.  Use your left hand to support your right hand so it doesn't bother your shoulder.  YOU NEED TO MAKE SURE YOU OPEN YOUR FINGERS FIRST before picking up a card. Tell yourself "OPEN MY FINGERS, PICK UP A CARD" every single time. Try to flip just one card at a time.  Try and turn your hand all the way over.     5. Practice picking tablespoons and forks and placing them in the silverware tray.  Put them on your table that adjusts and keep the table low.  Its ok to use your left hand to help with the weight of your right arm.

## 2017-12-22 NOTE — Therapy (Signed)
[facility] [address] [place], [place], [address] Phone: [phone]   Fax:  [phone]  Physical Therapy Treatment  Patient Details  Name: Kristi David MRN: [id number] Date of Birth: 1941-05-31 Referring Provider: Dr. Naaman Plummer   Encounter Date: 12/22/2017  PT End of Session - 12/22/17 1647    Visit Number  13    Number of Visits  18    Date for PT Re-Evaluation  12/30/17    Authorization Type  Medicare     Authorization Time Period  11/01/2017----12/30/2017    PT Start Time  1448    PT Stop Time  1530    PT Time Calculation (min)  42 min    Equipment Utilized During Treatment  Gait belt    Activity Tolerance  Patient tolerated treatment well    Behavior During Therapy  [facility] for tasks assessed/performed       Past Medical History:  Diagnosis Date  . Acute blood loss anemia   . Anemia   . Arthritis    "legs, knees" (01/07/2016)  . Arthritis of right hand 01/11/2017  . Diabetes mellitus ([place])   . Diastolic dysfunction   . Dysarthria, post-stroke   . Dysphagia, post-stroke   . Facial droop   . GERD (gastroesophageal reflux disease)    no meds  . High cholesterol   . History of blood transfusion   . Hyperlipidemia   . Hypertension   . Hypokalemia   . Left basal ganglia embolic stroke ([place]) 91/69/4503  . Osteoarthritis of right knee 01/06/2016  . Seasonal allergies   . Shortness of breath dyspnea    if too active  . Stroke ([place]) 07/16/2017  . Syncope    passed out and was hospitalized for a couple of days  . Type II diabetes mellitus ([place])    does not take medicine diet controlled    Past Surgical History:  Procedure Laterality Date  . BUNIONECTOMY Bilateral   . CATARACT EXTRACTION W/ INTRAOCULAR LENS  IMPLANT, BILATERAL Bilateral   . INGUINAL HERNIA REPAIR Right   . KNEE ARTHROSCOPY WITH EXCISION BAKER'S CYST Right   . MR LOWER LEG LEFT ([place] HX) Left    after a break  . PARTIAL KNEE ARTHROPLASTY  Right 01/06/2016   Procedure: RIGHT UNICOMPARTMENTAL KNEE ARTHROPLASTY;  Surgeon: Mcarthur Rossetti, MD;  Location: [facility];  Service: Orthopedics;  Laterality: Right;  . VEIN LIGATION AND STRIPPING Bilateral     There were no vitals filed for this visit.  Subjective Assessment - 12/22/17 1451    Subjective  Pt reports no falls, no pain, and no changes in medications. Pt also stated, "I feel like I am able to move my arm and hands better today, I am so happy."  "On a day like today I would be in in the garden area."     Patient is accompained by:  Family member    Limitations  Walking;House hold activities    Patient Stated Goals  pt wants to use R hand more efficently and walk without use of AD in community     Currently in Pain?  No/denies        [facility] Adult PT Treatment/Exercise - 12/22/17 1633      Transfers   Transfers  Sit to Stand;Stand to Sit    Sit to Stand  5: Supervision;From chair/3-in-1;With upper extremity assist;With armrests;From bed    Stand to Sit  5: Supervision;With upper extremity assist;To chair/3-in-1;With armrests;To bed  Comments  x 5 reps during seated rest breaks.       Ambulation/Gait   Ambulation/Gait  Yes    Ambulation/Gait Assistance  5: Supervision    Ambulation/Gait Assistance Details  Performed ambulation outdoors today with the use of a cane for 1200 feet. Pt was able to safely walk forward in grassy areas, uneven pine straw, up and down small curbs near parking lot, up and down small paved ramps, and up, up and down large grassy hill x 1 (used cane and SPTA hand assistance for safety for this task only.). Supervision assistance provided and no loss of balance issues. Pt was also performing a verbal cognitive task and holding a conversation during ambulation, which did reduce gait speed.     Ambulation Distance (Feet)  1200 Feet    Assistive device  None    Gait Pattern  Step-through pattern;Decreased arm swing - right;Decreased dorsiflexion -  right;Right hip hike;Lateral trunk lean to left;Poor foot clearance - right;Decreased stance time - right;Decreased stride length;Decreased hip/knee flexion - right;Decreased weight shift to right;Right genu recurvatum;Abducted- right;Shuffle    Ambulation Surface  Level;Indoor;Unlevel;Outdoor;Paved;Grass    Ramp  5: Supervision;Other (comment) Outdoors on paved sidewalk and grass    Curb  Other (comment);5: Supervision;6: Modified independent (Device/increase time);4: Min assist Outdoors on paved sidewalk.       High Level Balance   High Level Balance Activities  Marching forwards;Marching backwards;Side stepping;Backward walking    High Level Balance Comments  Performed on red and blue mats along counter x 3 reps each and while holding a 2# weighted ball close to body at sternal level: Forward walking, backward walking, side stepping, and marching forward and backwards. Pt required verbal cues for large steps both forward and backwards, and marching; no UE support used. SPTA provided supervision assistance only. No loss of balance issues,      Knee/Hip Exercises: Aerobic   Stepper  11 minutes at level 2. Using UE's and LE's.           PT Short Term Goals - 11/24/17 1540      PT SHORT TERM GOAL #1   Title  Pt will deomonstate independence with initial HEP (All STGs Target Date 11/29/2017)    Baseline  11/24/17- Pt verbally expressed confidence with HEP independence and initial HEP.     Status  Achieved    Target Date  11/29/17      PT SHORT TERM GOAL #2   Title  pt will demonstrate tandem walking 15steps min guard     Baseline  11/24/17: 9 steps with min guard, single UE assistance needed afterwards.    Status  Partially Met    Target Date  11/29/17      PT SHORT TERM GOAL #3   Title  Pt will demonstrate stepping over objects 6-8" tall without AD with supervision  in order to reduce falls risk     Baseline  11/24/17: Pt demonstrated ability to step over objects 6-8" without AD with  supervision in order to reduce falls risk.    Status  Achieved    Target Date  11/29/17      PT SHORT TERM GOAL #4   Title  pt will ambulate 600' without AD performing naming task with continual gait with supervision.     Baseline  11/24/17: Pt able to ambulate 650' without AD performing naming task with continual gait with supervision.     Status  Achieved    Target Date  11/29/17  PT SHORT TERM GOAL #5   Title  pain in right knee with daily activities decreased >/= 75%    Baseline  11/24/2017: Pt verbally expressed pain in right knee with daily activities decreased to >/= 25%    Status  Achieved        PT Long Term Goals - 11/16/17 1625      PT LONG TERM GOAL #1   Title  pt will demonstrate independence in ongoing HEP/ Fitness program in order to continue to maintain improvement made in PT (All LTGs Target Date 12/31/2017)    Time  2    Period  Months    Status  On-going    Target Date  12/31/17      PT LONG TERM GOAL #2   Title  Pt will demonstate a standard Timed Up-Go score of </=14.0" and a cognitive TUG score of</=15.0"    in order to improve functional gait     Baseline  standard-18.20" Cognitive 36.91"    Time  2    Period  Months    Status  On-going    Target Date  12/31/17      PT LONG TERM GOAL #3   Title  Pt will demonstate an Functional Gait Assessment score  >/= 24/30 in order to furhter improve funtional gait     Time  2    Period  Months    Status  On-going    Target Date  12/31/17      PT LONG TERM GOAL #4   Title  pt dill demonstrate gait speed of >/=2.62 ft/sec safely in order to indicate a safe community level ambulator    Baseline  2.31f/s    Time  2    Period  Months    Status  On-going    Target Date  12/31/17      PT LONG TERM GOAL #5   Title  pt will negotiate ramps,curbs,grass, and short paved distance without device modified independent to indicate ability for community access.     Baseline  min assist on curbs, supervision on ramps      Time  2    Period  Months    Status  On-going    Target Date  12/31/17      PT LONG TERM GOAL #6   Title  Pt will ambulate >/= 1000' while maintaining light conversation with fluent gait without device modified independent to enbable community mobility.     Time  2    Period  Months    Status  On-going    Target Date  12/31/17         Plan - 12/22/17 1648    Clinical Impression Statement  Today's skilled session focused on progression of gait distance outdoors while negotiating varying levels of paved sidewalks and grassy areas, ramps, curbs, and hills. Also, dynamic walking on uneven surfaces for balance improvement and stepper was performed for endurance. Pt continues to make steady progress towards LTG's and would benefit from continued PT interventions towards unmet PT goals.     History and Personal Factors relevant to plan of care:  basal ganglia, CVA, HTN, DM2, peripheral neuropathy    Clinical Presentation  Evolving    Clinical Presentation due to:  high fall risk, lives alone     Clinical Decision Making  Moderate    Rehab Potential  Good    PT Frequency  2x / week    PT Duration  Other (comment)  PT Treatment/Interventions  ADLs/Self Care Home Management;Aquatic Therapy;Biofeedback;Cryotherapy;Electrical Stimulation;Moist Heat;Traction;Ultrasound;DME Instruction;Stair training;Functional mobility training;Therapeutic activities;Therapeutic exercise;Balance training;Neuromuscular re-education;Cognitive remediation;Patient/family education;Orthotic Fit/Training;Manual techniques;Passive range of motion;Taping    PT Next Visit Plan  Continue to address dynamic walking balance with dual task gait and uneven surfaces towards LTG's.     Consulted and Agree with Plan of Care  Patient       Patient will benefit from skilled therapeutic intervention in order to improve the following deficits and impairments:  Abnormal gait, Decreased balance, Decreased mobility, Difficulty  walking, Decreased cognition, Decreased activity tolerance, Decreased safety awareness, Decreased strength, Impaired UE functional use, Decreased endurance, Postural dysfunction  Visit Diagnosis: Muscle weakness (generalized)  Spastic hemiplegia of right dominant side as late effect of cerebrovascular disease, unspecified cerebrovascular disease type ([place])  Unsteadiness on feet     Problem List Patient Active Problem List   Diagnosis Date Noted  . Adhesive capsulitis of right shoulder 11/28/2017  . Left basal ganglia embolic stroke ([place]) 24/73/1924  . Facial droop   . Right sided weakness   . Diabetes mellitus ([place])   . Diastolic dysfunction   . Dysphagia, post-stroke   . Dysarthria, post-stroke   . Hypokalemia   . Acute blood loss anemia   . Hyperlipidemia   . Stroke ([place]) 07/16/2017  . Hypertension 07/16/2017  . Arthritis of right hand 01/11/2017  . Osteoarthritis of right knee 01/06/2016  . Status post right partial knee replacement 01/06/2016    Carlena Sax, SPTA 12/22/2017, 4:54 PM  [facility] [address] [place], [place], [address] Phone: [phone]   Fax:  [phone]  Name: Kristi David MRN: [id number] Date of Birth: 01-21-1941

## 2017-12-27 ENCOUNTER — Ambulatory Visit: Payer: Medicare Other | Admitting: Occupational Therapy

## 2017-12-27 ENCOUNTER — Ambulatory Visit: Payer: Medicare Other | Admitting: Physical Therapy

## 2017-12-29 ENCOUNTER — Ambulatory Visit: Payer: Medicare Other | Attending: Physical Medicine & Rehabilitation | Admitting: Physical Therapy

## 2017-12-29 ENCOUNTER — Ambulatory Visit: Payer: Medicare Other | Admitting: Occupational Therapy

## 2017-12-29 ENCOUNTER — Encounter: Payer: Self-pay | Admitting: Occupational Therapy

## 2017-12-29 ENCOUNTER — Encounter: Payer: Medicare Other | Admitting: Occupational Therapy

## 2017-12-29 ENCOUNTER — Encounter: Payer: Self-pay | Admitting: Physical Therapy

## 2017-12-29 DIAGNOSIS — I69951 Hemiplegia and hemiparesis following unspecified cerebrovascular disease affecting right dominant side: Secondary | ICD-10-CM | POA: Diagnosis not present

## 2017-12-29 DIAGNOSIS — R2689 Other abnormalities of gait and mobility: Secondary | ICD-10-CM | POA: Insufficient documentation

## 2017-12-29 DIAGNOSIS — M79641 Pain in right hand: Secondary | ICD-10-CM

## 2017-12-29 DIAGNOSIS — M25611 Stiffness of right shoulder, not elsewhere classified: Secondary | ICD-10-CM

## 2017-12-29 DIAGNOSIS — M25641 Stiffness of right hand, not elsewhere classified: Secondary | ICD-10-CM | POA: Insufficient documentation

## 2017-12-29 DIAGNOSIS — R2681 Unsteadiness on feet: Secondary | ICD-10-CM | POA: Insufficient documentation

## 2017-12-29 DIAGNOSIS — I69318 Other symptoms and signs involving cognitive functions following cerebral infarction: Secondary | ICD-10-CM

## 2017-12-29 DIAGNOSIS — M25511 Pain in right shoulder: Secondary | ICD-10-CM | POA: Diagnosis not present

## 2017-12-29 DIAGNOSIS — R293 Abnormal posture: Secondary | ICD-10-CM

## 2017-12-29 DIAGNOSIS — M6281 Muscle weakness (generalized): Secondary | ICD-10-CM | POA: Diagnosis not present

## 2017-12-29 NOTE — Therapy (Signed)
[facility] [address] [place], [place], [address] Phone: [phone]   Fax:  [phone]  Occupational Therapy Treatment  Patient Details  Name: Kristi David MRN: [id number] Date of Birth: [birth year] Referring Provider: Dr. Naaman Plummer   Encounter Date: 12/29/2017  OT End of Session - 12/29/17 1631    Visit Number  12    Number of Visits  16    Date for OT Re-Evaluation  12/27/17    Authorization Type  medicare    Authorization Time Period  60 days    OT Start Time  1531    OT Stop Time  1612    OT Time Calculation (min)  41 min    Activity Tolerance  Patient tolerated treatment well       Past Medical History:  Diagnosis Date  . Acute blood loss anemia   . Anemia   . Arthritis    "legs, knees" (01/07/2016)  . Arthritis of right hand 01/11/2017  . Diabetes mellitus ([place])   . Diastolic dysfunction   . Dysarthria, post-stroke   . Dysphagia, post-stroke   . Facial droop   . GERD (gastroesophageal reflux disease)    no meds  . High cholesterol   . History of blood transfusion   . Hyperlipidemia   . Hypertension   . Hypokalemia   . Left basal ganglia embolic stroke ([place]) 45/80/9983  . Osteoarthritis of right knee 01/06/2016  . Seasonal allergies   . Shortness of breath dyspnea    if too active  . Stroke ([place]) 07/16/2017  . Syncope    passed out and was hospitalized for a couple of days  . Type II diabetes mellitus ([place])    does not take medicine diet controlled    Past Surgical History:  Procedure Laterality Date  . BUNIONECTOMY Bilateral   . CATARACT EXTRACTION W/ INTRAOCULAR LENS  IMPLANT, BILATERAL Bilateral   . INGUINAL HERNIA REPAIR Right   . KNEE ARTHROSCOPY WITH EXCISION BAKER'S CYST Right   . MR LOWER LEG LEFT ([facility] HX) Left    after a break  . PARTIAL KNEE ARTHROPLASTY Right 01/06/2016   Procedure: RIGHT UNICOMPARTMENTAL KNEE ARTHROPLASTY;  Surgeon: Mcarthur Rossetti, MD;  Location: [facility];  Service: Orthopedics;  Laterality: Right;  . VEIN LIGATION AND STRIPPING Bilateral     There were no vitals filed for this visit.  Subjective Assessment - 12/29/17 1535    Subjective   I want to be able to use my R hand more    Pertinent History  L BG CVA 07/16/2017.  Depression, HTN, HLD, DM, peripheral neuropathy.      Patient Stated Goals  to use my arm better    Currently in Pain?  No/denies                   OT Treatments/Exercises (OP) - 12/29/17 1620      ADLs   Writing  Addressed strategies for pre writing skills as well as printing and cursive for name. Pt did best using brown foam and coban wrap on pen for name and maker with coban wrap for tracing.  Pt issued foam, coban wrap and tracing sheets to work on shapes and letters.  Pt able to complete with AE and increased time.  Also reviewed HEP from last session and all activiies given in writing to pt.  Pt has met all goals and is ready for discharge.  Pt in agreement.  OT Education - 12/29/17 1625    Education provided  Yes    Education Details  writing strategies    Methods  Explanation;Demonstration;Handout    Comprehension  Verbalized understanding;Returned demonstration       OT Short Term Goals - 12/29/17 1626      OT SHORT TERM GOAL #1   Title  Pt will be mod I with HEP for RUE - 11/29/2017    Status  Achieved      OT SHORT TERM GOAL #2   Title  Pt will rate pain no greater than 3/10 for PROM for shoulder flexion to 100* in supine to allow for adequate ROM for self care activities.     Status  Achieved      OT SHORT TERM GOAL #3   Title  Pt will demonstrate ability to grasp cylindrical light weight object in R hand during light manipulation of item with L hand.     Status  Achieved      OT SHORT TERM GOAL #4   Title  Pt will demonstrate ability for mod I for bathing at shower level    Status  Achieved        OT Long Term Goals - 12/29/17 1626      OT LONG TERM GOAL  #1   Title  Pt will be mod I with upgraded HEP prn - 12/27/2017    Status  Achieved      OT LONG TERM GOAL #2   Title  Pt will rate pain in R shoulder no greater than 3/10 with functional use of RUE as stabilizer/occassional gross assist    Status  Achieved      OT LONG TERM GOAL #3   Title  Pt will demonstrate ability to use RUE as stabilizer at least 50% of the time with basic self care activities.     Status  Achieved      OT LONG TERM GOAL #4   Title  Pt will demonstrate shoulder flexion for RUE to at least 50* in preparation for low reach    Status  Achieved            Plan - 12/29/17 1626    Clinical Impression Statement  Pt has met all goals and is ready for discharge.     Occupational Profile and client history currently impacting functional performance  PMH:  Depression, HTN, HLD, DM, peripheral neuropathy.      Occupational performance deficits (Please refer to evaluation for details):  ADL's;IADL's;Leisure;Social Participation    Rehab Potential  Fair    Current Impairments/barriers affecting progress:  severity of UE deficits, R inattention    OT Frequency  2x / week    OT Duration  8 weeks    OT Treatment/Interventions  Self-care/ADL training;Cryotherapy;Moist Heat;Electrical Stimulation;DME and/or AE instruction;Neuromuscular education;Therapeutic exercise;Functional Mobility Training;Manual Therapy;Passive range of motion;Splinting;Therapeutic activities;Cognitive remediation/compensation;Patient/family education;Balance training    Plan  d.c from OT    Consulted and Agree with Plan of Care  Patient       Patient will benefit from skilled therapeutic intervention in order to improve the following deficits and impairments:     Visit Diagnosis: Muscle weakness (generalized)  Spastic hemiplegia of right dominant side as late effect of cerebrovascular disease, unspecified cerebrovascular disease type (HCC)  Unsteadiness on feet  Stiffness of right shoulder, not  elsewhere classified  Abnormal posture  Stiffness of right hand, not elsewhere classified  Pain of right shoulder joint on movement  Pain in  right hand  Other symptoms and signs involving cognitive functions following cerebral infarction    Problem List Patient Active Problem List   Diagnosis Date Noted  . Adhesive capsulitis of right shoulder 11/28/2017  . Left basal ganglia embolic stroke ([place]) 02/72/5366  . Facial droop   . Right sided weakness   . Diabetes mellitus ([place])   . Diastolic dysfunction   . Dysphagia, post-stroke   . Dysarthria, post-stroke   . Hypokalemia   . Acute blood loss anemia   . Hyperlipidemia   . Stroke ([place]) 07/16/2017  . Hypertension 07/16/2017  . Arthritis of right hand 01/11/2017  . Osteoarthritis of right knee 01/06/2016  . Status post right partial knee replacement 01/06/2016   OCCUPATIONAL THERAPY DISCHARGE SUMMARY  Visits from Start of Care: 12  Current functional level related to goals / functional outcomes: See above   Remaining deficits: R spastic hemiplegia, R inattention   Education / Equipment: HEP Plan: Patient agrees to discharge.  Patient goals were met. Patient is being discharged due to meeting the stated rehab goals.  ?????      Quay Burow , OTR/L 12/29/2017, 4:42 PM  [facility] [address] [place], [place], [address] Phone: [phone]   Fax:  [phone]  Name: Eira Alpert MRN: [id number] Date of Birth: [birth year]

## 2017-12-29 NOTE — Patient Instructions (Addendum)
Try these activities at home to get your right hand going:  1. Clean out your purse once a week - use your right hand and take every thing out and then put every thing back in.  2. Work on sliding pennies, nickels and quarters to the edge of the table and picking them up one at a time then make a pile with them.  Make sure your arm is supported on the table.    3.  Sit at a table with both hands over the table.  Practice throwing the small yellow ball back and forth. Make sure your left hand is actually throwing - don't let it get lazy.  You can also bounce the ball on the table and try and catch it.  You can also put your palm on the ball and roll it back forth and side to side.   4. Work on flipping cards.  Use your left hand to support your right hand so it doesn't bother your shoulder.  YOU NEED TO MAKE SURE YOU OPEN YOUR FINGERS FIRST before picking up a card. Tell yourself "OPEN MY FINGERS, PICK UP A CARD" every single time. Try to flip just one card at a time.  Try and turn your hand all the way over.     5. Practice picking tablespoons and forks and placing them in the silverware tray.  Put them on your table that adjusts and keep the table low.  Its ok to use your left hand to help with the weight of your right arm.       Instructions   Try these activities at home to get your right hand going:  1. Clean out your purse once a week - use your right hand and take every thing out and then put every thing back in.  2. Work on sliding pennies, nickels and quarters to the edge of the table and picking them up one at a time then make a pile with them.  Make sure your arm is supported on the table.    3.  Sit at a table with both hands over the table.  Practice throwing the small yellow ball back and forth. Make sure your left hand is actually throwing - don't let it get lazy.  You can also bounce the ball on the table and try and catch it.  You can also put your palm on the ball and  roll it back forth and side to side.   4. Work on flipping cards.  Use your left hand to support your right hand so it doesn't bother your shoulder.  YOU NEED TO MAKE SURE YOU OPEN YOUR FINGERS FIRST before picking up a card. Tell yourself "OPEN MY FINGERS, PICK UP A CARD" every single time. Try to flip just one card at a time.  Try and turn your hand all the way over.     5. Practice picking tablespoons and forks and placing them in the silverware tray.  Put them on your table that adjusts and keep the table low.  Its ok to use your left hand to help with the weight of your right arm.    6.  Practice writing your name. Place tablets around the house with a pen that has the brown foam and the wrap on it. Every time you walk by the tablet, sit down and practice writing your name. This will give your hand lots of practice without getting too tired.  7. Practice tracing the  shapes and letters I gave you . Use a marker that has the brown wrap on it so your hand doesn't slip when you hold it.  Make sure you give your hand a rest when you are working on this.

## 2017-12-30 NOTE — Therapy (Signed)
[facility] [address] [place], [place], [address] Phone: [phone]   Fax:  [phone]  Physical Therapy Treatment  Patient Details  Name: Kristi David MRN: [id number] Date of Birth: 02/25/1941 Referring Provider: Dr. Naaman Plummer   Encounter Date: 12/29/2017  PT End of Session - 12/29/17 1451    Visit Number  14    Number of Visits  18    Date for PT Re-Evaluation  12/30/17    Authorization Type  Medicare     Authorization Time Period  11/01/2017----12/30/2017    PT Start Time  1447    PT Stop Time  1530    PT Time Calculation (min)  43 min    Equipment Utilized During Treatment  Gait belt    Activity Tolerance  Patient tolerated treatment well    Behavior During Therapy  [facility] for tasks assessed/performed       Past Medical History:  Diagnosis Date  . Acute blood loss anemia   . Anemia   . Arthritis    "legs, knees" (01/07/2016)  . Arthritis of right hand 01/11/2017  . Diabetes mellitus ([place])   . Diastolic dysfunction   . Dysarthria, post-stroke   . Dysphagia, post-stroke   . Facial droop   . GERD (gastroesophageal reflux disease)    no meds  . High cholesterol   . History of blood transfusion   . Hyperlipidemia   . Hypertension   . Hypokalemia   . Left basal ganglia embolic stroke ([facility]) 17/49/4496  . Osteoarthritis of right knee 01/06/2016  . Seasonal allergies   . Shortness of breath dyspnea    if too active  . Stroke ([facility]) 07/16/2017  . Syncope    passed out and was hospitalized for a couple of days  . Type II diabetes mellitus ([facility] [place])    does not take medicine diet controlled    Past Surgical History:  Procedure Laterality Date  . BUNIONECTOMY Bilateral   . CATARACT EXTRACTION W/ INTRAOCULAR LENS  IMPLANT, BILATERAL Bilateral   . INGUINAL HERNIA REPAIR Right   . KNEE ARTHROSCOPY WITH EXCISION BAKER'S CYST Right   . MR LOWER LEG LEFT ([facility] HX) Left    after a break  . PARTIAL KNEE ARTHROPLASTY  Right 01/06/2016   Procedure: RIGHT UNICOMPARTMENTAL KNEE ARTHROPLASTY;  Surgeon: Mcarthur Rossetti, MD;  Location: [facility];  Service: Orthopedics;  Laterality: Right;  . VEIN LIGATION AND STRIPPING Bilateral     There were no vitals filed for this visit.  Subjective Assessment - 12/29/17 1449    Subjective  No new complaints. No falls or pain to report.     Limitations  Walking;House hold activities    Patient Stated Goals  pt wants to use R hand more efficently and walk without use of AD in community     Currently in Pain?  No/denies    Pain Score  0-No pain         [facility] PT Assessment - 12/29/17 1455      Transfers   Transfers  Sit to Stand;Stand to Sit    Sit to Stand  5: Supervision;From chair/3-in-1;With upper extremity assist;With armrests;From bed    Stand to Sit  5: Supervision;With upper extremity assist;To chair/3-in-1;With armrests;To bed      Ambulation/Gait   Ambulation/Gait  Yes    Ambulation/Gait Assistance  5: Supervision    Ambulation/Gait Assistance Details  pt able to maintain light conversation with all gait with right foot scuffing >85% of  gait.     Ambulation Distance (Feet)  1000 Feet    Assistive device  None    Gait Pattern  Step-through pattern;Decreased stride length;Decreased arm swing - right;Decreased hip/knee flexion - right;Poor foot clearance - right    Ambulation Surface  Level;Unlevel;Indoor;Outdoor;Paved    Gait velocity  12.25 sec's= 2.68 ft/sec no AD; 15.31sec's with cane = 2.14 ft/sec    Door Management  6: Modified independent (Device/Increase time)    Ramp  6: Modified independent (Device)    Ramp Details (indicate cue type and reason)  indoors with cane, outdoors supervision with no AD due to foot scuffing    Curb  6: Modified independent (Device/increase time)    Curb Details (indicate cue type and reason)  with cane on indoor 6 inch curb      Timed Up and Go Test   TUG  Normal TUG;Cognitive TUG    Normal TUG (seconds)  13.94 no  AD    Cognitive TUG (seconds)  16.85 no AD, naming foods a-z           PT Short Term Goals - 11/24/17 1540      PT SHORT TERM GOAL #1   Title  Pt will deomonstate independence with initial HEP (All STGs Target Date 11/29/2017)    Baseline  11/24/17- Pt verbally expressed confidence with HEP independence and initial HEP.     Status  Achieved    Target Date  11/29/17      PT SHORT TERM GOAL #2   Title  pt will demonstrate tandem walking 15steps min guard     Baseline  11/24/17: 9 steps with min guard, single UE assistance needed afterwards.    Status  Partially Met    Target Date  11/29/17      PT SHORT TERM GOAL #3   Title  Pt will demonstrate stepping over objects 6-8" tall without AD with supervision  in order to reduce falls risk     Baseline  11/24/17: Pt demonstrated ability to step over objects 6-8" without AD with supervision in order to reduce falls risk.    Status  Achieved    Target Date  11/29/17      PT SHORT TERM GOAL #4   Title  pt will ambulate 600' without AD performing naming task with continual gait with supervision.     Baseline  11/24/17: Pt able to ambulate 650' without AD performing naming task with continual gait with supervision.     Status  Achieved    Target Date  11/29/17      PT SHORT TERM GOAL #5   Title  pain in right knee with daily activities decreased >/= 75%    Baseline  11/24/2017: Pt verbally expressed pain in right knee with daily activities decreased to >/= 25%    Status  Achieved        PT Long Term Goals - 12/29/17 1451      PT LONG TERM GOAL #1   Title  pt will demonstrate independence in ongoing HEP/ Fitness program in order to continue to maintain improvement made in PT (All LTGs Target Date 12/31/2017)    Time  2    Period  Months    Status  On-going      PT LONG TERM GOAL #2   Title  Pt will demonstate a standard Timed Up-Go score of </=14.0" and a cognitive TUG score of</=15.0"    in order to improve functional gait  Baseline   12/29/17: standard 13.94 sec's, Cognitive 16.84 sec's - met the standard and progressed with cognitive TUG    Time  --    Period  --    Status  Partially Met      PT LONG TERM GOAL #3   Title  Pt will demonstate an Functional Gait Assessment score  >/= 24/30 in order to furhter improve funtional gait     Time  2    Period  Months    Status  On-going      PT LONG TERM GOAL #4   Title  pt dill demonstrate gait speed of >/=2.62 ft/sec safely in order to indicate a safe community level ambulator    Baseline  12/29/17: 2.68 ft/sec no AD, 2.14 ft/sec with cane    Time  --    Period  --    Status  Achieved      PT LONG TERM GOAL #5   Title  pt will negotiate ramps,curbs,grass, and short paved distance without device modified independent to indicate ability for community access.     Baseline  12/29/17: mod I with gait on paved surfaces with continued toe scuffing/no LOB, Mod I with ramps and curbs with cane, supervision with out AD    Time  --    Period  --    Status  Partially Met      PT LONG TERM GOAL #6   Title  Pt will ambulate >/= 1000' while maintaining light conversation with fluent gait without device modified independent to enbable community mobility.     Baseline  12/29/17: supervision due to continued toe scuffing for safety, no balance loss noted    Time  --    Period  --    Status  Partially Met         Plan - 12/29/17 1451    Clinical Impression Statement  Today's skilled session focused on progress toward LTGs with those checked today either met or partially met. Unable to assess all goals due to time constraints, will assess remaining goals at next session.     Rehab Potential  Good    PT Frequency  2x / week    PT Duration  Other (comment)    PT Treatment/Interventions  ADLs/Self Care Home Management;Aquatic Therapy;Biofeedback;Cryotherapy;Electrical Stimulation;Moist Heat;Traction;Ultrasound;DME Instruction;Stair training;Functional mobility training;Therapeutic  activities;Therapeutic exercise;Balance training;Neuromuscular re-education;Cognitive remediation;Patient/family education;Orthotic Fit/Training;Manual techniques;Passive range of motion;Taping    PT Next Visit Plan  check remaining LTGs for anticipated discharge on Monday 01/02/18    Consulted and Agree with Plan of Care  Patient       Patient will benefit from skilled therapeutic intervention in order to improve the following deficits and impairments:  Abnormal gait, Decreased balance, Decreased mobility, Difficulty walking, Decreased cognition, Decreased activity tolerance, Decreased safety awareness, Decreased strength, Impaired UE functional use, Decreased endurance, Postural dysfunction  Visit Diagnosis: Muscle weakness (generalized)  Unsteadiness on feet  Abnormal posture  Other abnormalities of gait and mobility     Problem List Patient Active Problem List   Diagnosis Date Noted  . Adhesive capsulitis of right shoulder 11/28/2017  . Left basal ganglia embolic stroke ([place]) 95/18/8416  . Facial droop   . Right sided weakness   . Diabetes mellitus ([place])   . Diastolic dysfunction   . Dysphagia, post-stroke   . Dysarthria, post-stroke   . Hypokalemia   . Acute blood loss anemia   . Hyperlipidemia   . Stroke ([place]) 07/16/2017  . Hypertension 07/16/2017  .  Arthritis of right hand 01/11/2017  . Osteoarthritis of right knee 01/06/2016  . Status post right partial knee replacement 01/06/2016    Willow Ora, PTA, [facility] [address] [place], [place] [address] [phone] 12/30/17, 8:20 PM   Name: Shaquanna Lycan MRN: [id number] Date of Birth: [birth year]

## 2018-01-02 ENCOUNTER — Encounter: Payer: Medicare Other | Admitting: Occupational Therapy

## 2018-01-02 ENCOUNTER — Encounter: Payer: Self-pay | Admitting: Physical Therapy

## 2018-01-02 ENCOUNTER — Ambulatory Visit: Payer: Medicare Other | Admitting: Physical Therapy

## 2018-01-02 DIAGNOSIS — I69951 Hemiplegia and hemiparesis following unspecified cerebrovascular disease affecting right dominant side: Secondary | ICD-10-CM | POA: Diagnosis not present

## 2018-01-02 DIAGNOSIS — M6281 Muscle weakness (generalized): Secondary | ICD-10-CM

## 2018-01-02 DIAGNOSIS — R2689 Other abnormalities of gait and mobility: Secondary | ICD-10-CM

## 2018-01-02 DIAGNOSIS — R293 Abnormal posture: Secondary | ICD-10-CM | POA: Diagnosis not present

## 2018-01-02 DIAGNOSIS — M25611 Stiffness of right shoulder, not elsewhere classified: Secondary | ICD-10-CM | POA: Diagnosis not present

## 2018-01-02 DIAGNOSIS — R2681 Unsteadiness on feet: Secondary | ICD-10-CM

## 2018-01-02 NOTE — Therapy (Signed)
[facility] [address] [place], [place], [address] Phone: [phone]   Fax:  [phone]  Physical Therapy Treatment  Patient Details  Name: Kristi David MRN: [id number] Date of Birth: 1941-04-07 Referring Provider: Dr. Naaman Plummer   Encounter Date: 01/02/2018  PT End of Session - 01/02/18 1542    Visit Number  15    Number of Visits  18    Date for PT Re-Evaluation  12/30/17    Authorization Type  Medicare     Authorization Time Period  11/01/2017----12/30/2017    PT Start Time  1327    PT Stop Time  1405    PT Time Calculation (min)  38 min    Equipment Utilized During Treatment  --    Activity Tolerance  Patient tolerated treatment well    Behavior During Therapy  [facility] for tasks assessed/performed       Past Medical History:  Diagnosis Date  . Acute blood loss anemia   . Anemia   . Arthritis    "legs, knees" (01/07/2016)  . Arthritis of right hand 01/11/2017  . Diabetes mellitus ([place])   . Diastolic dysfunction   . Dysarthria, post-stroke   . Dysphagia, post-stroke   . Facial droop   . GERD (gastroesophageal reflux disease)    no meds  . High cholesterol   . History of blood transfusion   . Hyperlipidemia   . Hypertension   . Hypokalemia   . Left basal ganglia embolic stroke ([place]) 00/92/3300  . Osteoarthritis of right knee 01/06/2016  . Seasonal allergies   . Shortness of breath dyspnea    if too active  . Stroke ([place]) 07/16/2017  . Syncope    passed out and was hospitalized for a couple of days  . Type II diabetes mellitus ([place])    does not take medicine diet controlled    Past Surgical History:  Procedure Laterality Date  . BUNIONECTOMY Bilateral   . CATARACT EXTRACTION W/ INTRAOCULAR LENS  IMPLANT, BILATERAL Bilateral   . INGUINAL HERNIA REPAIR Right   . KNEE ARTHROSCOPY WITH EXCISION BAKER'S CYST Right   . MR LOWER LEG LEFT ([facility] HX) Left    after a break  . PARTIAL KNEE ARTHROPLASTY Right  01/06/2016   Procedure: RIGHT UNICOMPARTMENTAL KNEE ARTHROPLASTY;  Surgeon: Mcarthur Rossetti, MD;  Location: [facility];  Service: Orthopedics;  Laterality: Right;  . VEIN LIGATION AND STRIPPING Bilateral     There were no vitals filed for this visit.  Subjective Assessment - 01/02/18 1333    Subjective  No falls. Went shopping without issues.     Limitations  Walking;House hold activities    Patient Stated Goals  pt wants to use R hand more efficently and walk without use of AD in community     Currently in Pain?  No/denies         [facility] PT Assessment - 01/02/18 1330      Observation/Other Assessments   Focus on Therapeutic Outcomes (FOTO)   65.90 Functional Status Initial FS 36.17      Transfers   Transfers  Sit to Stand;Stand to Sit    Sit to Stand  6: Modified independent (Device/Increase time);With upper extremity assist;With armrests;From chair/3-in-1    Stand to Sit  6: Modified independent (Device/Increase time);With upper extremity assist;With armrests;To chair/3-in-1      Ambulation/Gait   Ambulation/Gait  Yes    Ambulation/Gait Assistance  6: Modified independent (Device/Increase time)    Ambulation Distance (  Feet)  1000 Feet    Assistive device  None    Gait Pattern  Step-through pattern;Decreased stance time - left;Poor foot clearance - left scuffs toes occasionally but no balance loss    Ambulation Surface  Unlevel;Level;Indoor;Outdoor;Paved    Gait velocity  12.25 sec's= 2.68 ft/sec no AD; 15.31sec's with cane = 2.14 ft/sec    Door Management  6: Modified independent (Device/Increase time)    Ramp  6: Modified independent (Device)    Curb  6: Modified independent (Device/increase time)      Timed Up and Go Test   Normal TUG (seconds)  13.42 Initial TUG 18.53    Cognitive TUG (seconds)  15.01 Initial Cognitive TUG 36.91sec      Functional Gait  Assessment   Gait assessed   Yes    Gait Level Surface  Walks 20 ft, slow speed, abnormal gait pattern, evidence for  imbalance or deviates 10-15 in outside of the 12 in walkway width. Requires more than 7 sec to ambulate 20 ft.    Change in Gait Speed  Able to change speed, demonstrates mild gait deviations, deviates 6-10 in outside of the 12 in walkway width, or no gait deviations, unable to achieve a major change in velocity, or uses a change in velocity, or uses an assistive device.    Gait with Horizontal Head Turns  Performs head turns smoothly with slight change in gait velocity (eg, minor disruption to smooth gait path), deviates 6-10 in outside 12 in walkway width, or uses an assistive device.    Gait with Vertical Head Turns  Performs task with slight change in gait velocity (eg, minor disruption to smooth gait path), deviates 6 - 10 in outside 12 in walkway width or uses assistive device    Gait and Pivot Turn  Pivot turns safely within 3 sec and stops quickly with no loss of balance.    Step Over Obstacle  Is able to step over one shoe box (4.5 in total height) without changing gait speed. No evidence of imbalance.    Gait with Narrow Base of Support  Ambulates less than 4 steps heel to toe or cannot perform without assistance.    Gait with Eyes Closed  Walks 20 ft, slow speed, abnormal gait pattern, evidence for imbalance, deviates 10-15 in outside 12 in walkway width. Requires more than 9 sec to ambulate 20 ft.    Ambulating Backwards  Walks 20 ft, slow speed, abnormal gait pattern, evidence for imbalance, deviates 10-15 in outside 12 in walkway width.    Steps  Alternating feet, must use rail.    Total Score  16                             PT Short Term Goals - 11/24/17 1540      PT SHORT TERM GOAL #1   Title  Pt will deomonstate independence with initial HEP (All STGs Target Date 11/29/2017)    Baseline  11/24/17- Pt verbally expressed confidence with HEP independence and initial HEP.     Status  Achieved    Target Date  11/29/17      PT SHORT TERM GOAL #2   Title  pt will  demonstrate tandem walking 15steps min guard     Baseline  11/24/17: 9 steps with min guard, single UE assistance needed afterwards.    Status  Partially Met    Target Date  11/29/17  PT SHORT TERM GOAL #3   Title  Pt will demonstrate stepping over objects 6-8" tall without AD with supervision  in order to reduce falls risk     Baseline  11/24/17: Pt demonstrated ability to step over objects 6-8" without AD with supervision in order to reduce falls risk.    Status  Achieved    Target Date  11/29/17      PT SHORT TERM GOAL #4   Title  pt will ambulate 600' without AD performing naming task with continual gait with supervision.     Baseline  11/24/17: Pt able to ambulate 650' without AD performing naming task with continual gait with supervision.     Status  Achieved    Target Date  11/29/17      PT SHORT TERM GOAL #5   Title  pain in right knee with daily activities decreased >/= 75%    Baseline  11/24/2017: Pt verbally expressed pain in right knee with daily activities decreased to >/= 25%    Status  Achieved        PT Long Term Goals - 01/02/18 1546      PT LONG TERM GOAL #1   Title  pt will demonstrate independence in ongoing HEP/ Fitness program in order to continue to maintain improvement made in PT (All LTGs Target Date 12/31/2017)    Baseline  MET 01/02/2018    Time  2    Period  Months    Status  Achieved      PT LONG TERM GOAL #2   Title  Pt will demonstate a standard Timed Up-Go score of </=14.0" and a cognitive TUG score of</=15.0"    in order to improve functional gait     Baseline  MET 01/02/2018 std TUG 13.42sec and cognitive TUG 15.01sec    Status  Achieved      PT LONG TERM GOAL #3   Title  Pt will demonstate an Functional Gait Assessment score  >/= 24/30 in order to furhter improve funtional gait     Baseline  NOT MET 01/02/2018  FGA 16/30    Time  2    Period  Months    Status  Not Met      PT LONG TERM GOAL #4   Title  pt dill demonstrate gait speed of >/=2.62  ft/sec safely in order to indicate a safe community level ambulator    Baseline  12/29/17: 2.68 ft/sec no AD, 2.14 ft/sec with cane    Status  Achieved      PT LONG TERM GOAL #5   Title  pt will negotiate ramps,curbs,grass, and short paved distance without device modified independent to indicate ability for community access.     Baseline  MET 01/02/2018    Status  Achieved      PT LONG TERM GOAL #6   Title  Pt will ambulate >/= 1000' while maintaining light conversation with fluent gait without device modified independent to enbable community mobility.     Baseline  MET 01/02/2018    Status  Achieved            Plan - 01/02/18 1548    Clinical Impression Statement  Patient met or partially met 5of 6 LTGs. Her Functional Gait Assessment did not improve as much as anticipated. However her safety with gait including scanning environment did improve. She verbalizes understanding of ongoing HEP and recommendation to use Silver Sneakers at [facility] or local fitness center.  Rehab Potential  Good    PT Frequency  1x / week    PT Duration  -- 1 visit for re-evaluation on 01/02/2018    PT Treatment/Interventions  ADLs/Self Care Home Management;Aquatic Therapy;Biofeedback;Cryotherapy;Electrical Stimulation;Moist Heat;Traction;Ultrasound;DME Instruction;Stair training;Functional mobility training;Therapeutic activities;Therapeutic exercise;Balance training;Neuromuscular re-education;Cognitive remediation;Patient/family education;Orthotic Fit/Training;Manual techniques;Passive range of motion;Taping    PT Next Visit Plan  discharge    Consulted and Agree with Plan of Care  Patient       Patient will benefit from skilled therapeutic intervention in order to improve the following deficits and impairments:  Abnormal gait, Decreased balance, Decreased mobility, Difficulty walking, Decreased cognition, Decreased activity tolerance, Decreased safety awareness, Decreased strength, Impaired UE functional use,  Decreased endurance, Postural dysfunction  Visit Diagnosis: Muscle weakness (generalized)  Unsteadiness on feet  Abnormal posture  Other abnormalities of gait and mobility     Problem List Patient Active Problem List   Diagnosis Date Noted  . Adhesive capsulitis of right shoulder 11/28/2017  . Left basal ganglia embolic stroke ([place]) 75/64/3329  . Facial droop   . Right sided weakness   . Diabetes mellitus ([place])   . Diastolic dysfunction   . Dysphagia, post-stroke   . Dysarthria, post-stroke   . Hypokalemia   . Acute blood loss anemia   . Hyperlipidemia   . Stroke ([place]) 07/16/2017  . Hypertension 07/16/2017  . Arthritis of right hand 01/11/2017  . Osteoarthritis of right knee 01/06/2016  . Status post right partial knee replacement 01/06/2016   PHYSICAL THERAPY DISCHARGE SUMMARY  Visits from Start of Care: 15  Current functional level related to goals / functional outcomes: PT Long Term Goals - 01/02/18 1546      PT LONG TERM GOAL #1   Title  pt will demonstrate independence in ongoing HEP/ Fitness program in order to continue to maintain improvement made in PT (All LTGs Target Date 12/31/2017)    Baseline  MET 01/02/2018    Time  2    Period  Months    Status  Achieved      PT LONG TERM GOAL #2   Title  Pt will demonstate a standard Timed Up-Go score of </=14.0" and a cognitive TUG score of</=15.0"    in order to improve functional gait     Baseline  MET 01/02/2018 std TUG 13.42sec and cognitive TUG 15.01sec    Status  Achieved      PT LONG TERM GOAL #3   Title  Pt will demonstate an Functional Gait Assessment score  >/= 24/30 in order to furhter improve funtional gait     Baseline  NOT MET 01/02/2018  FGA 16/30    Time  2    Period  Months    Status  Not Met      PT LONG TERM GOAL #4   Title  pt dill demonstrate gait speed of >/=2.62 ft/sec safely in order to indicate a safe community level ambulator    Baseline  12/29/17: 2.68 ft/sec no AD, 2.14 ft/sec with cane     Status  Achieved      PT LONG TERM GOAL #5   Title  pt will negotiate ramps,curbs,grass, and short paved distance without device modified independent to indicate ability for community access.     Baseline  MET 01/02/2018    Status  Achieved      PT LONG TERM GOAL #6   Title  Pt will ambulate >/= 1000' while maintaining light conversation with fluent gait without device modified  independent to enbable community mobility.     Baseline  MET 01/02/2018    Status  Achieved        Remaining deficits: See above.  Weakness in LUE & LLE from hemiparesis.    Education / Equipment: HEP Plan: Patient agrees to discharge.  Patient goals were partially met. Patient is being discharged due to meeting the stated rehab goals.  ?????         Jeriel Vivanco PT, DPT 01/02/2018, 3:53 PM  [facility] [address] [place], [place], [address] Phone: [phone]   Fax:  [phone]  Name: Kristi David MRN: [id number] Date of Birth: [birth year]

## 2018-01-05 ENCOUNTER — Encounter: Payer: Medicare Other | Admitting: Occupational Therapy

## 2018-01-05 ENCOUNTER — Ambulatory Visit: Payer: Medicare Other | Admitting: Physical Therapy

## 2018-01-13 ENCOUNTER — Telehealth: Payer: Self-pay | Admitting: Interventional Cardiology

## 2018-01-13 NOTE — Telephone Encounter (Signed)
[facility] placed in Chart prep.

## 2018-01-30 ENCOUNTER — Encounter: Payer: Medicare Other | Attending: Registered Nurse | Admitting: Registered Nurse

## 2018-01-30 ENCOUNTER — Encounter: Payer: Medicare Other | Admitting: Physical Medicine & Rehabilitation

## 2018-01-30 ENCOUNTER — Encounter: Payer: Self-pay | Admitting: Registered Nurse

## 2018-01-30 VITALS — BP 177/72 | HR 60 | Resp 14 | Ht 66.0 in | Wt 202.0 lb

## 2018-01-30 DIAGNOSIS — Z79899 Other long term (current) drug therapy: Secondary | ICD-10-CM | POA: Insufficient documentation

## 2018-01-30 DIAGNOSIS — E1151 Type 2 diabetes mellitus with diabetic peripheral angiopathy without gangrene: Secondary | ICD-10-CM | POA: Insufficient documentation

## 2018-01-30 DIAGNOSIS — R531 Weakness: Secondary | ICD-10-CM

## 2018-01-30 DIAGNOSIS — I69322 Dysarthria following cerebral infarction: Secondary | ICD-10-CM | POA: Insufficient documentation

## 2018-01-30 DIAGNOSIS — K589 Irritable bowel syndrome without diarrhea: Secondary | ICD-10-CM | POA: Insufficient documentation

## 2018-01-30 DIAGNOSIS — I69351 Hemiplegia and hemiparesis following cerebral infarction affecting right dominant side: Secondary | ICD-10-CM | POA: Insufficient documentation

## 2018-01-30 DIAGNOSIS — Z96651 Presence of right artificial knee joint: Secondary | ICD-10-CM | POA: Diagnosis not present

## 2018-01-30 DIAGNOSIS — M7501 Adhesive capsulitis of right shoulder: Secondary | ICD-10-CM

## 2018-01-30 DIAGNOSIS — Z9889 Other specified postprocedural states: Secondary | ICD-10-CM | POA: Diagnosis not present

## 2018-01-30 DIAGNOSIS — Z9841 Cataract extraction status, right eye: Secondary | ICD-10-CM | POA: Insufficient documentation

## 2018-01-30 DIAGNOSIS — Z823 Family history of stroke: Secondary | ICD-10-CM | POA: Insufficient documentation

## 2018-01-30 DIAGNOSIS — Z9842 Cataract extraction status, left eye: Secondary | ICD-10-CM | POA: Insufficient documentation

## 2018-01-30 DIAGNOSIS — M1990 Unspecified osteoarthritis, unspecified site: Secondary | ICD-10-CM | POA: Diagnosis not present

## 2018-01-30 DIAGNOSIS — I639 Cerebral infarction, unspecified: Secondary | ICD-10-CM | POA: Diagnosis not present

## 2018-01-30 DIAGNOSIS — R1310 Dysphagia, unspecified: Secondary | ICD-10-CM | POA: Insufficient documentation

## 2018-01-30 DIAGNOSIS — E785 Hyperlipidemia, unspecified: Secondary | ICD-10-CM | POA: Diagnosis not present

## 2018-01-30 DIAGNOSIS — I10 Essential (primary) hypertension: Secondary | ICD-10-CM | POA: Insufficient documentation

## 2018-01-30 DIAGNOSIS — Z8249 Family history of ischemic heart disease and other diseases of the circulatory system: Secondary | ICD-10-CM | POA: Insufficient documentation

## 2018-01-30 DIAGNOSIS — E1142 Type 2 diabetes mellitus with diabetic polyneuropathy: Secondary | ICD-10-CM | POA: Diagnosis not present

## 2018-01-30 DIAGNOSIS — K219 Gastro-esophageal reflux disease without esophagitis: Secondary | ICD-10-CM | POA: Diagnosis not present

## 2018-01-30 NOTE — Progress Notes (Signed)
Subjective:    Patient ID: Kristi David, female    DOB: [birth year], 77 y.o.   MRN: [id number]  HPI: [name] is a 77 year old female who has return for follow up of her left basal ganglia infarct and associated right hemiparesis and chronic pain. She states her pain is located in her right shoulder, right knee and left ankle. [name] is adamant she needs something for her pain, we discuss other options other than analgesics and other treatment modalities. She states " why do I need to come here". This provider was trying to provide support and educate [name] in regards to her complaints of pain , she verbalize understanding.  Also stated she is having a hard time receiving her medication from pharmacy, this provider called [facility], the pharmacist stated she on'y has workman compensation and no primary insurance. They have been helping [name] with this matter she reports. [name] gave the pharmacist a ID number but it was the same number the pharmacy had already. I spoke with [name] in detail to obtain assistance from her son to help her with this matter, she verbalizes understanding. Also reports her son is helping her.   She rated her pain 0. Her current exercise regime is walking.   Arrived hypertensive blood pressure re-checked. [name] states she is compliant with her medications. Instructed to keep blood pressure log and call her PCP, she verbalizes understanding.   Pain Inventory Average Pain 0 Pain Right Now 0 My pain is no pain  In the last 24 hours, has pain interfered with the following? General activity 0 Relation with others 0 Enjoyment of life 0 What TIME of day is your pain at its worst? no pain Sleep (in general) Fair  Pain is worse with: no pain Pain improves with: no pain Relief from Meds: no pain  Mobility walk with assistance use a cane ability to climb steps?  yes do you drive?  no  Function retired I need assistance with the  following:  shopping Do you have any goals in this area?  yes  Neuro/Psych trouble walking anxiety  Prior Studies Any changes since last visit?  no  Physicians involved in your care Any changes since last visit?  no   Family History  Problem Relation Age of Onset  . Stroke Mother   . CAD Father    Social History   Socioeconomic History  . Marital status: Divorced    Spouse name: Not on file  . Number of children: Not on file  . Years of education: Not on file  . Highest education level: Not on file  Occupational History  . Not on file  Social Needs  . Financial resource strain: Not on file  . Food insecurity:    Worry: Not on file    Inability: Not on file  . Transportation needs:    Medical: Not on file    Non-medical: Not on file  Tobacco Use  . Smoking status: Never Smoker  . Smokeless tobacco: Never Used  Substance and Sexual Activity  . Alcohol use: No  . Drug use: No  . Sexual activity: Not on file  Lifestyle  . Physical activity:    Days per week: Not on file    Minutes per session: Not on file  . Stress: Not on file  Relationships  . Social connections:    Talks on phone: Not on file    Gets together: Not on file  Attends religious service: Not on file    Active member of club or organization: Not on file    Attends meetings of clubs or organizations: Not on file    Relationship status: Not on file  Other Topics Concern  . Not on file  Social History Narrative  . Not on file   Past Surgical History:  Procedure Laterality Date  . BUNIONECTOMY Bilateral   . CATARACT EXTRACTION W/ INTRAOCULAR LENS  IMPLANT, BILATERAL Bilateral   . INGUINAL HERNIA REPAIR Right   . KNEE ARTHROSCOPY WITH EXCISION BAKER'S CYST Right   . MR LOWER LEG LEFT ([facility] HX) Left    after a break  . PARTIAL KNEE ARTHROPLASTY Right 01/06/2016   Procedure: RIGHT UNICOMPARTMENTAL KNEE ARTHROPLASTY;  Surgeon: Kathryne Hitch, MD;  Location: [facility];  Service:  Orthopedics;  Laterality: Right;  . VEIN LIGATION AND STRIPPING Bilateral    Past Medical History:  Diagnosis Date  . Acute blood loss anemia   . Anemia   . Arthritis    "legs, knees" (01/07/2016)  . Arthritis of right hand 01/11/2017  . Diabetes mellitus ([facility])   . Diastolic dysfunction   . Dysarthria, post-stroke   . Dysphagia, post-stroke   . Facial droop   . GERD (gastroesophageal reflux disease)    no meds  . High cholesterol   . History of blood transfusion   . Hyperlipidemia   . Hypertension   . Hypokalemia   . Left basal ganglia embolic stroke ([facility]) 07/18/2017  . Osteoarthritis of right knee 01/06/2016  . Seasonal allergies   . Shortness of breath dyspnea    if too active  . Stroke ([facility]) 07/16/2017  . Syncope    passed out and was hospitalized for a couple of days  . Type II diabetes mellitus ([facility])    does not take medicine diet controlled   BP (!) 174/77 (BP Location: Right Arm, Patient Position: Sitting, Cuff Size: Normal)   Pulse (!) 56   Resp 14   Ht  (1.676 m)   Wt 202 lb (91.6 kg)   SpO2 98%   BMI 32.60 kg/m   Opioid Risk Score:   Fall Risk Score:  `1  Depression screen PHQ 2/9  Depression screen [facility] 2/9 08/30/2017 08/30/2017  Decreased Interest 0 1  Down, Depressed, Hopeless 0 1  PHQ - 2 Score 0 2  Altered sleeping 1 -  Tired, decreased energy 1 -  Change in appetite 0 -  Feeling bad or failure about yourself  0 -  Trouble concentrating 0 -  Moving slowly or fidgety/restless 1 -  Suicidal thoughts 0 -  PHQ-9 Score 3 -  Difficult doing work/chores Not difficult at all -    Review of Systems  Constitutional: Positive for unexpected weight change.  Respiratory: Positive for shortness of breath.   Cardiovascular: Positive for leg swelling.  Gastrointestinal: Positive for constipation.  Endocrine:       High blood sugar  Musculoskeletal: Positive for gait problem.  Psychiatric/Behavioral: The patient is nervous/anxious.          Objective:   Physical Exam  Constitutional: She is oriented to person, place, and time. She appears well-developed and well-nourished.  HENT:  Head: Normocephalic and atraumatic.  Neck: Normal range of motion. Neck supple.  Cardiovascular: Normal rate and regular rhythm.  Pulmonary/Chest: Effort normal and breath sounds normal.  Musculoskeletal:  Normal Muscle Bulk and Muscle testing Reveals: Upper Extremities: Right: Decreased ROM 45 Degrees and Muscle Strength  2/5 Left: Full ROM and Muscle Strength 5/5 Right AC Joint Tenderness Lower Extremities: Full ROM and Muscle Strength 5/5 Arises from Table with ease Narrow based Gait  Neurological: She is alert and oriented to person, place, and time.  Skin: Skin is warm and dry.  Psychiatric: She has a normal mood and affect.  Nursing note and vitals reviewed.         Assessment & Plan:  1. Left Basal Ganglia Embolic [place]/ Right Sided weakness: Continue HEP as Tolerated Continue to Monitor.  2. Right Adhesive Capsulitis of Right Shoulder: Continue HEP as Tolerated Continue to Monitor.  3. Hypertension: Continue current medication regimen. PCP Following.   30 minutes of face to face patient care time was spent during this visit. All questions was encouraged and answered.   F/U in 2 months with Dr Riley Kill

## 2018-02-05 ENCOUNTER — Other Ambulatory Visit (INDEPENDENT_AMBULATORY_CARE_PROVIDER_SITE_OTHER): Payer: Self-pay | Admitting: Orthopaedic Surgery

## 2018-02-06 NOTE — Telephone Encounter (Signed)
Please advise 

## 2018-02-21 DIAGNOSIS — E7800 Pure hypercholesterolemia, unspecified: Secondary | ICD-10-CM | POA: Diagnosis not present

## 2018-02-21 DIAGNOSIS — G629 Polyneuropathy, unspecified: Secondary | ICD-10-CM | POA: Diagnosis not present

## 2018-02-21 DIAGNOSIS — Z8673 Personal history of transient ischemic attack (TIA), and cerebral infarction without residual deficits: Secondary | ICD-10-CM | POA: Diagnosis not present

## 2018-02-21 DIAGNOSIS — I10 Essential (primary) hypertension: Secondary | ICD-10-CM | POA: Diagnosis not present

## 2018-02-21 DIAGNOSIS — F419 Anxiety disorder, unspecified: Secondary | ICD-10-CM | POA: Diagnosis not present

## 2018-02-21 DIAGNOSIS — Z6830 Body mass index (BMI) 30.0-30.9, adult: Secondary | ICD-10-CM | POA: Diagnosis not present

## 2018-04-03 ENCOUNTER — Encounter: Payer: Medicare Other | Attending: Registered Nurse | Admitting: Physical Medicine & Rehabilitation

## 2018-04-03 DIAGNOSIS — E785 Hyperlipidemia, unspecified: Secondary | ICD-10-CM | POA: Insufficient documentation

## 2018-04-03 DIAGNOSIS — I10 Essential (primary) hypertension: Secondary | ICD-10-CM | POA: Insufficient documentation

## 2018-04-03 DIAGNOSIS — Z9841 Cataract extraction status, right eye: Secondary | ICD-10-CM | POA: Insufficient documentation

## 2018-04-03 DIAGNOSIS — E1151 Type 2 diabetes mellitus with diabetic peripheral angiopathy without gangrene: Secondary | ICD-10-CM | POA: Insufficient documentation

## 2018-04-03 DIAGNOSIS — Z8249 Family history of ischemic heart disease and other diseases of the circulatory system: Secondary | ICD-10-CM | POA: Insufficient documentation

## 2018-04-03 DIAGNOSIS — Z9889 Other specified postprocedural states: Secondary | ICD-10-CM | POA: Insufficient documentation

## 2018-04-03 DIAGNOSIS — E1142 Type 2 diabetes mellitus with diabetic polyneuropathy: Secondary | ICD-10-CM | POA: Insufficient documentation

## 2018-04-03 DIAGNOSIS — I639 Cerebral infarction, unspecified: Secondary | ICD-10-CM | POA: Insufficient documentation

## 2018-04-03 DIAGNOSIS — K589 Irritable bowel syndrome without diarrhea: Secondary | ICD-10-CM | POA: Insufficient documentation

## 2018-04-03 DIAGNOSIS — Z96651 Presence of right artificial knee joint: Secondary | ICD-10-CM | POA: Insufficient documentation

## 2018-04-03 DIAGNOSIS — Z823 Family history of stroke: Secondary | ICD-10-CM | POA: Insufficient documentation

## 2018-04-03 DIAGNOSIS — Z9842 Cataract extraction status, left eye: Secondary | ICD-10-CM | POA: Insufficient documentation

## 2018-04-03 DIAGNOSIS — R1310 Dysphagia, unspecified: Secondary | ICD-10-CM | POA: Insufficient documentation

## 2018-04-03 DIAGNOSIS — K219 Gastro-esophageal reflux disease without esophagitis: Secondary | ICD-10-CM | POA: Insufficient documentation

## 2018-04-03 DIAGNOSIS — I69322 Dysarthria following cerebral infarction: Secondary | ICD-10-CM | POA: Insufficient documentation

## 2018-04-03 DIAGNOSIS — Z79899 Other long term (current) drug therapy: Secondary | ICD-10-CM | POA: Insufficient documentation

## 2018-04-03 DIAGNOSIS — M1990 Unspecified osteoarthritis, unspecified site: Secondary | ICD-10-CM | POA: Insufficient documentation

## 2018-04-03 DIAGNOSIS — I69351 Hemiplegia and hemiparesis following cerebral infarction affecting right dominant side: Secondary | ICD-10-CM | POA: Insufficient documentation

## 2018-05-08 ENCOUNTER — Other Ambulatory Visit (INDEPENDENT_AMBULATORY_CARE_PROVIDER_SITE_OTHER): Payer: Self-pay | Admitting: Orthopaedic Surgery

## 2018-05-08 NOTE — Telephone Encounter (Signed)
Please advise 

## 2018-05-24 DIAGNOSIS — G629 Polyneuropathy, unspecified: Secondary | ICD-10-CM | POA: Diagnosis not present

## 2018-05-24 DIAGNOSIS — N3281 Overactive bladder: Secondary | ICD-10-CM | POA: Diagnosis not present

## 2018-05-24 DIAGNOSIS — Z8673 Personal history of transient ischemic attack (TIA), and cerebral infarction without residual deficits: Secondary | ICD-10-CM | POA: Diagnosis not present

## 2018-05-24 DIAGNOSIS — F419 Anxiety disorder, unspecified: Secondary | ICD-10-CM | POA: Diagnosis not present

## 2018-05-24 DIAGNOSIS — M1990 Unspecified osteoarthritis, unspecified site: Secondary | ICD-10-CM | POA: Diagnosis not present

## 2018-05-24 DIAGNOSIS — I700 Atherosclerosis of aorta: Secondary | ICD-10-CM | POA: Diagnosis not present

## 2018-05-24 DIAGNOSIS — Z23 Encounter for immunization: Secondary | ICD-10-CM | POA: Diagnosis not present

## 2018-05-24 DIAGNOSIS — I10 Essential (primary) hypertension: Secondary | ICD-10-CM | POA: Diagnosis not present

## 2018-06-26 ENCOUNTER — Ambulatory Visit (INDEPENDENT_AMBULATORY_CARE_PROVIDER_SITE_OTHER): Payer: Medicare Other | Admitting: Orthopaedic Surgery

## 2018-06-26 ENCOUNTER — Ambulatory Visit (INDEPENDENT_AMBULATORY_CARE_PROVIDER_SITE_OTHER): Payer: Medicare Other

## 2018-06-26 ENCOUNTER — Other Ambulatory Visit (INDEPENDENT_AMBULATORY_CARE_PROVIDER_SITE_OTHER): Payer: Self-pay

## 2018-06-26 ENCOUNTER — Encounter (INDEPENDENT_AMBULATORY_CARE_PROVIDER_SITE_OTHER): Payer: Self-pay | Admitting: Orthopaedic Surgery

## 2018-06-26 DIAGNOSIS — M25572 Pain in left ankle and joints of left foot: Secondary | ICD-10-CM

## 2018-06-26 DIAGNOSIS — M25562 Pain in left knee: Principal | ICD-10-CM

## 2018-06-26 DIAGNOSIS — M25561 Pain in right knee: Principal | ICD-10-CM

## 2018-06-26 DIAGNOSIS — G8929 Other chronic pain: Secondary | ICD-10-CM

## 2018-06-26 MED ORDER — LIDOCAINE HCL 1 % IJ SOLN
3.0000 mL | INTRAMUSCULAR | Status: AC | PRN
  Administered 2018-06-26: 3 mL

## 2018-06-26 MED ORDER — METHYLPREDNISOLONE ACETATE 40 MG/ML IJ SUSP
40.0000 mg | INTRAMUSCULAR | Status: AC | PRN
  Administered 2018-06-26: 40 mg via INTRA_ARTICULAR

## 2018-06-26 NOTE — Progress Notes (Signed)
Office Visit Note   Patient: Kristi David           Date of Birth: March 16, 1941           MRN: [id number] Visit Date: 06/26/2018              Requested by: Sigmund Hazel, MD [address] [place], [place] [address] PCP: Sigmund Hazel, MD   Assessment & Plan: Visit Diagnoses:  1. Left knee pain, unspecified chronicity   2. Right knee pain, unspecified chronicity   3. Pain in left ankle and joints of left foot     Plan: Per her wishes I did provide a steroid injection in both knees without difficulty.  I recommended she use her cane in her right hand however this is her weaker side due to her residual stroke.  Also want to try an ASO for her left ankle.  She is a perfect candidate for outpatient physical therapy to work on bilateral lower extremity strengthening and other modalities to improve her gait and function.  All questions concerns were answered and addressed.  We will work on setting her up for the outpatient therapy.  Follow-Up Instructions: Return in about 6 weeks (around 08/07/2018).   Orders:  Orders Placed This Encounter  Procedures  . XR Knee 1-2 Views Left  . XR Knee 1-2 Views Right  . XR Ankle Complete Left   No orders of the defined types were placed in this encounter.     Procedures: Large Joint Inj: R knee on 06/26/2018 5:06 PM Indications: diagnostic evaluation and pain Details: 22 G 1.5 in needle, superolateral approach  Arthrogram: No  Medications: 3 mL lidocaine 1 %; 40 mg methylPREDNISolone acetate 40 MG/ML Outcome: tolerated well, no immediate complications Procedure, treatment alternatives, risks and benefits explained, specific risks discussed. Consent was given by the patient. Immediately prior to procedure a time out was called to verify the correct patient, procedure, equipment, support staff and site/side marked as required. Patient was prepped and draped in the usual sterile fashion.   Large Joint Inj: L knee on 06/26/2018 5:06  PM Indications: diagnostic evaluation and pain Details: 22 G 1.5 in needle, superolateral approach  Arthrogram: No  Medications: 3 mL lidocaine 1 %; 40 mg methylPREDNISolone acetate 40 MG/ML Outcome: tolerated well, no immediate complications Procedure, treatment alternatives, risks and benefits explained, specific risks discussed. Consent was given by the patient. Immediately prior to procedure a time out was called to verify the correct patient, procedure, equipment, support staff and site/side marked as required. Patient was prepped and draped in the usual sterile fashion.       Clinical Data: No additional findings.   Subjective: Chief Complaint  Patient presents with  . Left Knee - Pain  . Left Ankle - Pain  The patient is a very pleasant 77 year old I have not seen in a while.  We actually performed a right partial knee replacement on her a few years ago.  She has bilateral knee pain with the left worse than the right.  She also has left ankle pain.  She ambulates using a cane in her left hand.  She had a stroke a year ago.  She is on Plavix.  She is a borderline diabetic but not on any medications for this.  She would like to have at least a steroid injection in each knee today.  She has been a fall risk.  HPI  Review of Systems She currently denies any headache, chest pain, shortness  of breath, fever, chills, nausea, vomiting.  Objective: Vital Signs: There were no vitals taken for this visit.  Physical Exam She is alert and oriented x3 and in no acute distress Ortho Exam Examination of both knees show some patellofemoral crepitation on both sides.  Right knee has a well-healed surgical incision and no significant joint line tenderness.  The left knee has significant medial joint line tenderness.  She also has tenderness over the medial malleolus of her left ankle and along the course the posterior tibial tendon. Specialty Comments:  No specialty comments  available.  Imaging: Xr Ankle Complete Left  Result Date: 06/26/2018 3 views the left ankle show no acute findings.  The joint space is well-maintained.  There are calcifications around the medial malleolus suggesting an old fracture  Xr Knee 1-2 Views Left  Result Date: 06/26/2018 2 views of the left knee show severe end-stage arthritis with para-articular osteophytes in all 3 compartments and slight varus malalignment.  Xr Knee 1-2 Views Right  Result Date: 06/26/2018 2 views of the right knee show a well-seated medial unicompartmental knee replacement with no comp gating features.  There is severe patellofemoral arthritic changes.    PMFS History: Patient Active Problem List   Diagnosis Date Noted  . Adhesive capsulitis of right shoulder 11/28/2017  . Left basal ganglia embolic stroke (HCC) 07/18/2017  . Facial droop   . Right sided weakness   . Diabetes mellitus (HCC)   . Diastolic dysfunction   . Dysphagia, post-stroke   . Dysarthria, post-stroke   . Hypokalemia   . Acute blood loss anemia   . Hyperlipidemia   . Stroke (HCC) 07/16/2017  . Hypertension 07/16/2017  . Arthritis of right hand 01/11/2017  . Osteoarthritis of right knee 01/06/2016  . Status post right partial knee replacement 01/06/2016   Past Medical History:  Diagnosis Date  . Acute blood loss anemia   . Anemia   . Arthritis    "legs, knees" (01/07/2016)  . Arthritis of right hand 01/11/2017  . Diabetes mellitus (HCC)   . Diastolic dysfunction   . Dysarthria, post-stroke   . Dysphagia, post-stroke   . Facial droop   . GERD (gastroesophageal reflux disease)    no meds  . High cholesterol   . History of blood transfusion   . Hyperlipidemia   . Hypertension   . Hypokalemia   . Left basal ganglia embolic stroke (HCC) 07/18/2017  . Osteoarthritis of right knee 01/06/2016  . Seasonal allergies   . Shortness of breath dyspnea    if too active  . Stroke (HCC) 07/16/2017  . Syncope    passed out  and was hospitalized for a couple of days  . Type II diabetes mellitus (HCC)    does not take medicine diet controlled    Family History  Problem Relation Age of Onset  . Stroke Mother   . CAD Father     Past Surgical History:  Procedure Laterality Date  . BUNIONECTOMY Bilateral   . CATARACT EXTRACTION W/ INTRAOCULAR LENS  IMPLANT, BILATERAL Bilateral   . INGUINAL HERNIA REPAIR Right   . KNEE ARTHROSCOPY WITH EXCISION BAKER'S CYST Right   . MR LOWER LEG LEFT ([facility] HX) Left    after a break  . PARTIAL KNEE ARTHROPLASTY Right 01/06/2016   Procedure: RIGHT UNICOMPARTMENTAL KNEE ARTHROPLASTY;  Surgeon: Kathryne Hitch, MD;  Location: [facility] OR;  Service: Orthopedics;  Laterality: Right;  . VEIN LIGATION AND STRIPPING Bilateral    Social History  Occupational History  . Not on file  Tobacco Use  . Smoking status: Never Smoker  . Smokeless tobacco: Never Used  Substance and Sexual Activity  . Alcohol use: No  . Drug use: No  . Sexual activity: Not on file

## 2018-07-03 DIAGNOSIS — Z23 Encounter for immunization: Secondary | ICD-10-CM | POA: Diagnosis not present

## 2018-07-18 ENCOUNTER — Ambulatory Visit: Payer: Medicare Other | Attending: Orthopaedic Surgery

## 2018-07-18 ENCOUNTER — Other Ambulatory Visit: Payer: Self-pay

## 2018-07-18 DIAGNOSIS — M25572 Pain in left ankle and joints of left foot: Secondary | ICD-10-CM | POA: Diagnosis not present

## 2018-07-18 DIAGNOSIS — G8929 Other chronic pain: Secondary | ICD-10-CM | POA: Diagnosis not present

## 2018-07-18 DIAGNOSIS — M6281 Muscle weakness (generalized): Secondary | ICD-10-CM | POA: Diagnosis not present

## 2018-07-18 DIAGNOSIS — R2681 Unsteadiness on feet: Secondary | ICD-10-CM | POA: Insufficient documentation

## 2018-07-18 DIAGNOSIS — R2689 Other abnormalities of gait and mobility: Secondary | ICD-10-CM | POA: Insufficient documentation

## 2018-07-18 DIAGNOSIS — M25562 Pain in left knee: Secondary | ICD-10-CM | POA: Insufficient documentation

## 2018-07-18 DIAGNOSIS — M25561 Pain in right knee: Secondary | ICD-10-CM | POA: Insufficient documentation

## 2018-07-18 DIAGNOSIS — R293 Abnormal posture: Secondary | ICD-10-CM

## 2018-07-18 NOTE — Therapy (Signed)
[facility] Outpatient Rehabilitation [facility] [address] [place], [place], [address] Phone: [phone]   Fax:  [phone]  Physical Therapy Evaluation  Patient Details  Name: Kristi David MRN: [id number] Date of Birth: 12-07-1940 Referring Provider (PT): Doneen Poisson, MD   Encounter Date: 07/18/2018  PT End of Session - 07/18/18 1122    Visit Number  1    Number of Visits  12    Date for PT Re-Evaluation  09/01/18    Authorization Type  MCR    PT Start Time  1015    PT Stop Time  1100    PT Time Calculation (min)  45 min    Activity Tolerance  Patient tolerated treatment well    Behavior During Therapy  [facility] for tasks assessed/performed       Past Medical History:  Diagnosis Date  . Acute blood loss anemia   . Anemia   . Arthritis    "legs, knees" (01/07/2016)  . Arthritis of right hand 01/11/2017  . Diabetes mellitus (HCC)   . Diastolic dysfunction   . Dysarthria, post-stroke   . Dysphagia, post-stroke   . Facial droop   . GERD (gastroesophageal reflux disease)    no meds  . High cholesterol   . History of blood transfusion   . Hyperlipidemia   . Hypertension   . Hypokalemia   . Left basal ganglia embolic stroke (HCC) 07/18/2017  . Osteoarthritis of right knee 01/06/2016  . Seasonal allergies   . Shortness of breath dyspnea    if too active  . Stroke (HCC) 07/16/2017  . Syncope    passed out and was hospitalized for a couple of days  . Type II diabetes mellitus (HCC)    does not take medicine diet controlled    Past Surgical History:  Procedure Laterality Date  . BUNIONECTOMY Bilateral   . CATARACT EXTRACTION W/ INTRAOCULAR LENS  IMPLANT, BILATERAL Bilateral   . INGUINAL HERNIA REPAIR Right   . KNEE ARTHROSCOPY WITH EXCISION BAKER'S CYST Right   . MR LOWER LEG LEFT ([facility] HX) Left    after a break  . PARTIAL KNEE ARTHROPLASTY Right 01/06/2016   Procedure: RIGHT UNICOMPARTMENTAL KNEE ARTHROPLASTY;  Surgeon: Kathryne Hitch, MD;  Location: [facility] OR;  Service: Orthopedics;  Laterality: Right;  . VEIN LIGATION AND STRIPPING Bilateral     There were no vitals filed for this visit.   Subjective Assessment - 07/18/18 1024    Subjective  She reports her to get more exercise due to pain in LT ankle ( previous fracture).  She was more independent before CVA 06/2017.   Injections helped alot. Both knees    Limitations  Standing;Walking;House hold activities    How long can you walk comfortably?  maybe a block. fatigue/pain stops distance.     Patient Stated Goals  Walk better to do more home tasks.        Currently in Pain?  No/denies    Pain Location  Ankle    Pain Orientation  Left    Pain Descriptors / Indicators  Burning   hurt   Pain Type  Chronic pain    Pain Onset  More than a month ago    Pain Frequency  Intermittent    Aggravating Factors   when swells she has pain , no pain no limit.     Pain Relieving Factors  elevate and ice     Multiple Pain Sites  Yes    Pain Score  0   stops activity on feet when moderate   Pain Location  Knee    Pain Orientation  Left;Right;Anterior    Pain Descriptors / Indicators  Aching    Pain Type  Chronic pain    Pain Onset  More than a month ago    Pain Frequency  Intermittent    Aggravating Factors   walking,standing   home tasks.     Pain Relieving Factors  relax meds         [facility] PT Assessment - 07/18/18 0001      Assessment   Medical Diagnosis  chronic Lt ankle and bilateral knee pain    Referring Provider (PT)  Doneen Poisson, MD    Onset Date/Surgical Date  --   2 years ago, surgery RT knee 18 mon ago   Next MD Visit  6 weeks    Prior Therapy  yes * months ago after CVA.        Precautions   Precautions  None    Required Braces or Orthoses  Other Brace/Splint    Other Brace/Splint  ASO ankle not worn today      Restrictions   Weight Bearing Restrictions  No      Balance Screen   Has the patient fallen in the past 6 months  No     Has the patient had a decrease in activity level because of a fear of falling?   No      Home Environment   Living Environment  Private residence    Living Arrangements  Alone    Type of Home  Apartment    Home Access  Elevator    Home Layout  One level    Home Equipment  [place] - single point;Walker - 4 wheels;Shower seat;Grab bars - tub/shower;Bedside commode;Hand held shower head      Prior Function   Level of Independence  Requires assistive device for independence;Needs assistance with homemaking      Cognition   Overall Cognitive Status  Within Functional Limits for tasks assessed      ROM / Strength   AROM / PROM / Strength  AROM;Strength      AROM   AROM Assessment Site  Knee;Ankle    Right/Left Ankle  Left;Right    Right Ankle Dorsiflexion  95    Right Ankle Inversion  35    Right Ankle Eversion  12    Left Ankle Dorsiflexion  95    Left Ankle Inversion  35    Left Ankle Eversion  12      Strength   Overall Strength Comments  RT hip 3-/5 abduction , flexion 3+/5  extension 3+/5    Lt hip  4-/5 abduction 4/5 flexion 3+/5 ext        Strength Assessment Site  Knee;Ankle    Right/Left Knee  Right;Left    Right Knee Flexion  5/5    Right Knee Extension  4+/5    Left Knee Flexion  5/5    Left Knee Extension  4+/5    Right/Left Ankle  Right;Left    Right Ankle Dorsiflexion  4+/5    Right Ankle Plantar Flexion  3-/5    Right Ankle Inversion  5/5    Right Ankle Eversion  5/5    Left Ankle Dorsiflexion  4+/5    Left Ankle Plantar Flexion  3-/5    Left Ankle Inversion  5/5    Left Ankle Eversion  5/5  Standardized Balance Assessment   Standardized Balance Assessment  Berg Balance Test      Berg Balance Test   Sit to Stand  Able to stand using hands after several tries    Standing Unsupported  Able to stand safely 2 minutes    Sitting with Back Unsupported but Feet Supported on Floor or Stool  Able to sit safely and securely 2 minutes    Stand to Sit  Controls  descent by using hands    Transfers  Able to transfer safely, definite need of hands    Standing Unsupported with Eyes Closed  Able to stand 10 seconds safely    Standing Ubsupported with Feet Together  Able to place feet together independently and stand for 1 minute with supervision    From Standing, Reach Forward with Outstretched Arm  Can reach forward >12 cm safely (5")    From Standing Position, Pick up Object from Floor  Able to pick up shoe safely and easily    From Standing Position, Turn to Look Behind Over each Shoulder  Looks behind from both sides and weight shifts well    Turn 360 Degrees  Able to turn 360 degrees safely but slowly    Standing Unsupported, Alternately Place Feet on Step/Stool  Able to complete 4 steps without aid or supervision    Standing Unsupported, One Foot in Front  Able to plae foot ahead of the other independently and hold 30 seconds    Standing on One Leg  Tries to lift leg/unable to hold 3 seconds but remains standing independently    Total Score  42    Berg comment:  Need for cane in and out of home                Objective measurements completed on examination: See above findings.              PT Education - 07/18/18 1122    Education Details  POC , BERG results need for cane at all times, Need toi exercise on a regular basis    Person(s) Educated  Patient    Methods  Explanation    Comprehension  Verbalized understanding       PT Short Term Goals - 07/18/18 1130      PT SHORT TERM GOAL #1   Title  Pt will deomonstate independence with initial HEP    Time  3    Period  Weeks    Status  New      PT SHORT TERM GOAL #2   Title  pt will demonstrate tandem walking 15steps min guard     Time  3    Period  Weeks    Status  New      PT SHORT TERM GOAL #3   Title  She will improve BERG score to 45/56 to demo improved balance.     Time  3    Period  Weeks    Status  New      PT SHORT TERM GOAL #5   Title  pain in right  knee with daily activities with incr to no more than 3/10     Time  3    Period  Weeks    Status  New        PT Long Term Goals - 07/18/18 1133      PT LONG TERM GOAL #1   Title  pt will demonstrate independence in ongoing HEP/ Fitness program in order  to continue to maintain improvement made in PT     Time  6    Period  Weeks    Status  New      PT LONG TERM GOAL #2   Title  Berg score improved to 50/56 to demo improved balance     Time  6    Period  Weeks    Status  New      PT LONG TERM GOAL #3   Title  She will demo 4/5 hip strength bilaterally to improve stability with gait and home tasks    Time  6    Period  Weeks    Status  New      PT LONG TERM GOAL #5   Title  She will be able to perform 50% more home tasks due to improved strength    Time  6    Period  Weeks    Status  New             Plan - 07/18/18 1126    Clinical Impression Statement  Ms Muhlestein presents with weakness of both hips and quads and gastrocs. She has swelling RT knee woith decr extension.   Her balance is decreased with 42/56 score on BERG test.   She has ahd a CVA in past year and this most likely impacts her strength and balance and function. If she exercises at home with PT she should be able to improve strength. . We may not be able to impact pain significantly but will work to minimize.    History and Personal Factors relevant to plan of care:  RT knee scope , CVA, ankle pain . knee pain, decr balance    Clinical Presentation  Evolving    Clinical Presentation due to:  bialtreral knee pain and left ankle pain intermittant with decr LE strength    Clinical Decision Making  Moderate    Rehab Potential  Good    PT Frequency  2x / week    PT Duration  6 weeks    PT Treatment/Interventions  Passive range of motion;Manual techniques;Patient/family education;Therapeutic exercise;Therapeutic activities;Cryotherapy;Moist Heat;Functional mobility training    PT Next Visit Plan  Initiate HEP , ,  balance activity    Consulted and Agree with Plan of Care  Patient       Patient will benefit from skilled therapeutic intervention in order to improve the following deficits and impairments:  Pain, Decreased activity tolerance, Decreased endurance, Decreased balance, Decreased strength, Difficulty walking, Increased edema  Visit Diagnosis: Muscle weakness (generalized)  Unsteadiness on feet  Abnormal posture  Other abnormalities of gait and mobility  Pain in left ankle and joints of left foot  Chronic pain of right knee  Chronic pain of left knee     Problem List Patient Active Problem List   Diagnosis Date Noted  . Adhesive capsulitis of right shoulder 11/28/2017  . Left basal ganglia embolic stroke (HCC) 07/18/2017  . Facial droop   . Right sided weakness   . Diabetes mellitus (HCC)   . Diastolic dysfunction   . Dysphagia, post-stroke   . Dysarthria, post-stroke   . Hypokalemia   . Acute blood loss anemia   . Hyperlipidemia   . Stroke (HCC) 07/16/2017  . Hypertension 07/16/2017  . Arthritis of right hand 01/11/2017  . Osteoarthritis of right knee 01/06/2016  . Status post right partial knee replacement 01/06/2016    Kristi David  PT 07/18/2018, 11:41 AM  [facility]  Rehabilitation [facility] [address] [place], [place], [address] Phone: [phone]   Fax:  [phone]  Name: Kristi David MRN: [id number] Date of Birth: 29-Apr-1941

## 2018-07-24 ENCOUNTER — Other Ambulatory Visit (INDEPENDENT_AMBULATORY_CARE_PROVIDER_SITE_OTHER): Payer: Self-pay | Admitting: Physician Assistant

## 2018-07-24 NOTE — Telephone Encounter (Signed)
Please advise 

## 2018-07-26 ENCOUNTER — Encounter: Payer: Self-pay | Admitting: Physical Therapy

## 2018-07-26 ENCOUNTER — Ambulatory Visit: Payer: Medicare Other | Admitting: Physical Therapy

## 2018-07-26 DIAGNOSIS — R2689 Other abnormalities of gait and mobility: Secondary | ICD-10-CM | POA: Diagnosis not present

## 2018-07-26 DIAGNOSIS — M25572 Pain in left ankle and joints of left foot: Secondary | ICD-10-CM

## 2018-07-26 DIAGNOSIS — M6281 Muscle weakness (generalized): Secondary | ICD-10-CM | POA: Diagnosis not present

## 2018-07-26 DIAGNOSIS — R293 Abnormal posture: Secondary | ICD-10-CM

## 2018-07-26 DIAGNOSIS — R2681 Unsteadiness on feet: Secondary | ICD-10-CM

## 2018-07-26 DIAGNOSIS — M25561 Pain in right knee: Secondary | ICD-10-CM | POA: Diagnosis not present

## 2018-07-26 DIAGNOSIS — M25562 Pain in left knee: Secondary | ICD-10-CM

## 2018-07-26 DIAGNOSIS — G8929 Other chronic pain: Secondary | ICD-10-CM

## 2018-07-26 NOTE — Therapy (Signed)
[place], [place], [address] Phone: [phone]   Fax:  [phone]  Physical Therapy Treatment  Patient Details  Name: Kristi David MRN: [id number] Date of Birth: [birth year] Referring Provider (PT): Jean Rosenthal, MD   Encounter Date: 07/26/2018  PT End of Session - 07/26/18 1101    Visit Number  2    Number of Visits  12    Date for PT Re-Evaluation  09/01/18    Authorization Type  MCR    PT Start Time  1101    PT Stop Time  1140    PT Time Calculation (min)  39 min    Activity Tolerance  Patient limited by fatigue       Past Medical History:  Diagnosis Date  . Acute blood loss anemia   . Anemia   . Arthritis    "legs, knees" (01/07/2016)  . Arthritis of right hand 01/11/2017  . Diabetes mellitus ([place])   . Diastolic dysfunction   . Dysarthria, post-stroke   . Dysphagia, post-stroke   . Facial droop   . GERD (gastroesophageal reflux disease)    no meds  . High cholesterol   . History of blood transfusion   . Hyperlipidemia   . Hypertension   . Hypokalemia   . Left basal ganglia embolic stroke ([place]) 65/11/5463  . Osteoarthritis of right knee 01/06/2016  . Seasonal allergies   . Shortness of breath dyspnea    if too active  . Stroke ([place]) 07/16/2017  . Syncope    passed out and was hospitalized for a couple of days  . Type II diabetes mellitus ([place])    does not take medicine diet controlled    Past Surgical History:  Procedure Laterality Date  . BUNIONECTOMY Bilateral   . CATARACT EXTRACTION W/ INTRAOCULAR LENS  IMPLANT, BILATERAL Bilateral   . INGUINAL HERNIA REPAIR Right   . KNEE ARTHROSCOPY WITH EXCISION BAKER'S CYST Right   . MR LOWER LEG LEFT ([place] HX) Left    after a break  . PARTIAL KNEE ARTHROPLASTY Right 01/06/2016   Procedure: RIGHT UNICOMPARTMENTAL KNEE ARTHROPLASTY;  Surgeon: Mcarthur Rossetti, MD;  Location: [facility];  Service: Orthopedics;  Laterality: Right;   . VEIN LIGATION AND STRIPPING Bilateral     There were no vitals filed for this visit.  Subjective Assessment - 07/26/18 1105    Subjective  Pt reports she is doing ok, brought her folder with all her exercises to review and see what she should be doing.     Patient Stated Goals  Walk better to do more home tasks.        Currently in Pain?  No/denies   reports stiffness        [facility] PT Assessment - 07/26/18 0001      Assessment   Medical Diagnosis  chronic Lt ankle and bilateral knee pain                   [facility] Adult PT Treatment/Exercise - 07/26/18 0001      Exercises   Exercises  Knee/Hip      Knee/Hip Exercises: Stretches   Gastroc Stretch  Both;2 reps;30 seconds      Knee/Hip Exercises: Aerobic   Nustep  L5x6' arms and legs      Knee/Hip Exercises: Standing   SLS  2x10 with toe taps FWD/side/BWD both legs      Knee/Hip Exercises: Seated   Sit to Sand  20 reps;with  UE support             PT Education - 07/26/18 1119    Education Details  looked through her HEP folder - there were a lot in it from 2017.  She was issued a new HEP insetard of sorting through the old    Person(s) Educated  Patient    Methods  Explanation;Demonstration;Handout    Comprehension  Verbal cues required;Returned demonstration;Verbalized understanding       PT Short Term Goals - 07/18/18 1130      PT SHORT TERM GOAL #1   Title  Pt will deomonstate independence with initial HEP    Time  3    Period  Weeks    Status  New      PT SHORT TERM GOAL #2   Title  pt will demonstrate tandem walking 15steps min guard     Time  3    Period  Weeks    Status  New      PT SHORT TERM GOAL #3   Title  She will improve BERG score to 45/56 to demo improved balance.     Time  3    Period  Weeks    Status  New      PT SHORT TERM GOAL #5   Title  pain in right knee with daily activities with incr to no more than 3/10     Time  3    Period  Weeks    Status  New         PT Long Term Goals - 07/18/18 1133      PT LONG TERM GOAL #1   Title  pt will demonstrate independence in ongoing HEP/ Fitness program in order to continue to maintain improvement made in PT     Time  6    Period  Weeks    Status  New      PT LONG TERM GOAL #2   Title  Berg score improved to 50/56 to demo improved balance     Time  6    Period  Weeks    Status  New      PT LONG TERM GOAL #3   Title  She will demo 4/5 hip strength bilaterally to improve stability with gait and home tasks    Time  6    Period  Weeks    Status  New      PT LONG TERM GOAL #5   Title  She will be able to perform 50% more home tasks due to improved strength    Time  6    Period  Weeks    Status  New            Plan - 07/26/18 1124    Clinical Impression Statement  This is Kristi David's second visit this go round.  She presented with a  however she was not using it.  She continues with LE weakness and tightness in her ankles.  She required VC for form for all exercise. Patients legs were tired at the end of session.  No goals met at this time.     Rehab Potential  Good    PT Frequency  2x / week    PT Duration  6 weeks    PT Treatment/Interventions  Passive range of motion;Manual techniques;Patient/family education;Therapeutic exercise;Therapeutic activities;Cryotherapy;Moist Heat;Functional mobility training    PT Next Visit Plan  review HEP and cont with balance work and LE ther ex.  Consulted and Agree with Plan of Care  Patient       Patient will benefit from skilled therapeutic intervention in order to improve the following deficits and impairments:  Pain, Decreased activity tolerance, Decreased endurance, Decreased balance, Decreased strength, Difficulty walking, Increased edema  Visit Diagnosis: Muscle weakness (generalized)  Unsteadiness on feet  Abnormal posture  Other abnormalities of gait and mobility  Pain in left ankle and joints of left foot  Chronic pain of  right knee  Chronic pain of left knee     Problem List Patient Active Problem List   Diagnosis Date Noted  . Adhesive capsulitis of right shoulder 11/28/2017  . Left basal ganglia embolic stroke ([place]) 35/57/3220  . Facial droop   . Right sided weakness   . Diabetes mellitus ([place])   . Diastolic dysfunction   . Dysphagia, post-stroke   . Dysarthria, post-stroke   . Hypokalemia   . Acute blood loss anemia   . Hyperlipidemia   . Stroke ([place]) 07/16/2017  . Hypertension 07/16/2017  . Arthritis of right hand 01/11/2017  . Osteoarthritis of right knee 01/06/2016  . Status post right partial knee replacement 01/06/2016    Jeral Pinch PT  07/26/2018, 11:40 AM  [facility] [place], [place], [address] Phone: [phone]   Fax:  [phone]  Name: Kristi David MRN: [id number] Date of Birth: 24-Feb-1941

## 2018-07-28 ENCOUNTER — Encounter: Payer: Self-pay | Admitting: Physical Therapy

## 2018-07-28 ENCOUNTER — Ambulatory Visit: Payer: Medicare Other | Attending: Orthopaedic Surgery | Admitting: Physical Therapy

## 2018-07-28 DIAGNOSIS — G8929 Other chronic pain: Secondary | ICD-10-CM | POA: Insufficient documentation

## 2018-07-28 DIAGNOSIS — R2689 Other abnormalities of gait and mobility: Secondary | ICD-10-CM | POA: Insufficient documentation

## 2018-07-28 DIAGNOSIS — R293 Abnormal posture: Secondary | ICD-10-CM | POA: Diagnosis not present

## 2018-07-28 DIAGNOSIS — M25561 Pain in right knee: Secondary | ICD-10-CM | POA: Diagnosis not present

## 2018-07-28 DIAGNOSIS — M25562 Pain in left knee: Secondary | ICD-10-CM | POA: Diagnosis not present

## 2018-07-28 DIAGNOSIS — R2681 Unsteadiness on feet: Secondary | ICD-10-CM

## 2018-07-28 DIAGNOSIS — M6281 Muscle weakness (generalized): Secondary | ICD-10-CM | POA: Insufficient documentation

## 2018-07-28 DIAGNOSIS — M25572 Pain in left ankle and joints of left foot: Secondary | ICD-10-CM | POA: Diagnosis not present

## 2018-07-28 NOTE — Therapy (Signed)
[facility] [address] [place], [place], [address] Phone: [phone]   Fax:  [phone]  Physical Therapy Treatment  Patient Details  Name: Kristi David MRN: [id number] Date of Birth: May 21, 1941 Referring Provider (PT): Doneen Poisson, MD   Encounter Date: 07/28/2018  PT End of Session - 07/28/18 0954    Visit Number  3    Number of Visits  12    Date for PT Re-Evaluation  09/01/18    Authorization Type  MCR    PT Start Time  1018    PT Stop Time  1059    PT Time Calculation (min)  41 min    Activity Tolerance  Patient tolerated treatment well       Past Medical History:  Diagnosis Date  . Acute blood loss anemia   . Anemia   . Arthritis    "legs, knees" (01/07/2016)  . Arthritis of right hand 01/11/2017  . Diabetes mellitus ([facility])   . Diastolic dysfunction   . Dysarthria, post-stroke   . Dysphagia, post-stroke   . Facial droop   . GERD (gastroesophageal reflux disease)    no meds  . High cholesterol   . History of blood transfusion   . Hyperlipidemia   . Hypertension   . Hypokalemia   . Left basal ganglia embolic stroke ([facility]) 07/18/2017  . Osteoarthritis of right knee 01/06/2016  . Seasonal allergies   . Shortness of breath dyspnea    if too active  . Stroke ([facility]) 07/16/2017  . Syncope    passed out and was hospitalized for a couple of days  . Type II diabetes mellitus ([facility])    does not take medicine diet controlled    Past Surgical History:  Procedure Laterality Date  . BUNIONECTOMY Bilateral   . CATARACT EXTRACTION W/ INTRAOCULAR LENS  IMPLANT, BILATERAL Bilateral   . INGUINAL HERNIA REPAIR Right   . KNEE ARTHROSCOPY WITH EXCISION BAKER'S CYST Right   . MR LOWER LEG LEFT ([facility] HX) Left    after a break  . PARTIAL KNEE ARTHROPLASTY Right 01/06/2016   Procedure: RIGHT UNICOMPARTMENTAL KNEE ARTHROPLASTY;  Surgeon: Kathryne Hitch, MD;  Location: [facility] OR;  Service: Orthopedics;  Laterality:  Right;  . VEIN LIGATION AND STRIPPING Bilateral     There were no vitals filed for this visit.  Subjective Assessment - 07/28/18 1022    Subjective  Kristi David reports she was sore after her last treatment, rested and now is good again.     Patient Stated Goals  Walk better to do more home tasks.        Currently in Pain?  No/denies                       [facility] Adult PT Treatment/Exercise - 07/28/18 0001      High Level Balance   High Level Balance Activities  Side stepping;Backward walking   at counter top.    High Level Balance Comments  toe taps alternating on 8" step with single arm assist, tandem stance alternating arm reaches       Exercises   Exercises  Knee/Hip      Knee/Hip Exercises: Stretches   Gastroc Stretch  Both;1 rep;20 seconds      Knee/Hip Exercises: Aerobic   Nustep  L5x6' arms and legs      Knee/Hip Exercises: Standing   Heel Raises  Both;5 reps;3 sets   toes in/out/straight   SLS  10 reps  to review her HEP toe taps FWD/side/BWD each leg.       Knee/Hip Exercises: Seated   Sit to Sand  10 reps;with UE support   minimal UE support today     Knee/Hip Exercises: Supine   Bridges  Strengthening;15 reps    Other Supine Knee/Hip Exercises  15 reps isometric hip flex in single leg table top position    Other Supine Knee/Hip Exercises  10 x 5 sec leg lengtheners each side.                PT Short Term Goals - 07/18/18 1130      PT SHORT TERM GOAL #1   Title  Pt will deomonstate independence with initial HEP    Time  3    Period  Weeks    Status  New      PT SHORT TERM GOAL #2   Title  pt will demonstrate tandem walking 15steps min guard     Time  3    Period  Weeks    Status  New      PT SHORT TERM GOAL #3   Title  She will improve BERG score to 45/56 to demo improved balance.     Time  3    Period  Weeks    Status  New      PT SHORT TERM GOAL #5   Title  pain in right knee with daily activities with incr to no more than  3/10     Time  3    Period  Weeks    Status  New        PT Long Term Goals - 07/18/18 1133      PT LONG TERM GOAL #1   Title  pt will demonstrate independence in ongoing HEP/ Fitness program in order to continue to maintain improvement made in PT     Time  6    Period  Weeks    Status  New      PT LONG TERM GOAL #2   Title  Berg score improved to 50/56 to demo improved balance     Time  6    Period  Weeks    Status  New      PT LONG TERM GOAL #3   Title  She will demo 4/5 hip strength bilaterally to improve stability with gait and home tasks    Time  6    Period  Weeks    Status  New      PT LONG TERM GOAL #5   Title  She will be able to perform 50% more home tasks due to improved strength    Time  6    Period  Weeks    Status  New            Plan - 07/28/18 1055    Clinical Impression Statement  Kristi David was able to perform her new HEP with minimal VC for form.  she walked without her cane in the clinic between her exercises with no loss of balamce.  Does tend to have a wider BOS and LE ER to assist with stabilization.     Rehab Potential  Good    PT Frequency  2x / week    PT Duration  6 weeks    PT Treatment/Interventions  Passive range of motion;Manual techniques;Patient/family education;Therapeutic exercise;Therapeutic activities;Cryotherapy;Moist Heat;Functional mobility training    PT Next Visit Plan  LE and core stengthening and balance  work.     Financial planner with Plan of Care  Patient       Patient will benefit from skilled therapeutic intervention in order to improve the following deficits and impairments:  Pain, Decreased activity tolerance, Decreased endurance, Decreased balance, Decreased strength, Difficulty walking, Increased edema  Visit Diagnosis: Muscle weakness (generalized)  Unsteadiness on feet  Abnormal posture  Other abnormalities of gait and mobility  Pain in left ankle and joints of left foot  Chronic pain of right  knee  Chronic pain of left knee     Problem List Patient Active Problem List   Diagnosis Date Noted  . Adhesive capsulitis of right shoulder 11/28/2017  . Left basal ganglia embolic stroke ([facility]) 07/18/2017  . Facial droop   . Right sided weakness   . Diabetes mellitus ([facility])   . Diastolic dysfunction   . Dysphagia, post-stroke   . Dysarthria, post-stroke   . Hypokalemia   . Acute blood loss anemia   . Hyperlipidemia   . Stroke ([facility]) 07/16/2017  . Hypertension 07/16/2017  . Arthritis of right hand 01/11/2017  . Osteoarthritis of right knee 01/06/2016  . Status post right partial knee replacement 01/06/2016    Roderic Scarce PT  07/28/2018, 11:00 AM  [facility] [address] [place], [place], [address] Phone: [phone]   Fax:  [phone]  Name: Kristi David MRN: [id number] Date of Birth: [birth year]

## 2018-07-31 ENCOUNTER — Ambulatory Visit: Payer: Medicare Other

## 2018-07-31 DIAGNOSIS — M6281 Muscle weakness (generalized): Secondary | ICD-10-CM | POA: Diagnosis not present

## 2018-07-31 DIAGNOSIS — R2681 Unsteadiness on feet: Secondary | ICD-10-CM

## 2018-07-31 DIAGNOSIS — R293 Abnormal posture: Secondary | ICD-10-CM

## 2018-07-31 DIAGNOSIS — R2689 Other abnormalities of gait and mobility: Secondary | ICD-10-CM | POA: Diagnosis not present

## 2018-07-31 DIAGNOSIS — M25561 Pain in right knee: Secondary | ICD-10-CM | POA: Diagnosis not present

## 2018-07-31 DIAGNOSIS — M25572 Pain in left ankle and joints of left foot: Secondary | ICD-10-CM | POA: Diagnosis not present

## 2018-07-31 NOTE — Therapy (Signed)
[facility] [address] [place], [place], [address] Phone: [phone]   Fax:  [phone]  Physical Therapy Treatment  Patient Details  Name: Kristi David MRN: [id number] Date of Birth: 01-18-1941 Referring Provider (PT): Doneen Poisson, MD   Encounter Date: 07/31/2018  PT End of Session - 07/31/18 1036    Visit Number  4    Number of Visits  12    Date for PT Re-Evaluation  09/01/18    Authorization Type  MCR    Authorization Time Period  Progress visit 10  KX visit 15    PT Start Time  1037    PT Stop Time  1132    PT Time Calculation (min)  55 min    Activity Tolerance  Patient tolerated treatment well    Behavior During Therapy  [facility] for tasks assessed/performed       Past Medical History:  Diagnosis Date  . Acute blood loss anemia   . Anemia   . Arthritis    "legs, knees" (01/07/2016)  . Arthritis of right hand 01/11/2017  . Diabetes mellitus ([facility])   . Diastolic dysfunction   . Dysarthria, post-stroke   . Dysphagia, post-stroke   . Facial droop   . GERD (gastroesophageal reflux disease)    no meds  . High cholesterol   . History of blood transfusion   . Hyperlipidemia   . Hypertension   . Hypokalemia   . Left basal ganglia embolic stroke ([facility]) 07/18/2017  . Osteoarthritis of right knee 01/06/2016  . Seasonal allergies   . Shortness of breath dyspnea    if too active  . Stroke ([facility]) 07/16/2017  . Syncope    passed out and was hospitalized for a couple of days  . Type II diabetes mellitus ([facility])    does not take medicine diet controlled    Past Surgical History:  Procedure Laterality Date  . BUNIONECTOMY Bilateral   . CATARACT EXTRACTION W/ INTRAOCULAR LENS  IMPLANT, BILATERAL Bilateral   . INGUINAL HERNIA REPAIR Right   . KNEE ARTHROSCOPY WITH EXCISION BAKER'S CYST Right   . MR LOWER LEG LEFT ([facility] HX) Left    after a break  . PARTIAL KNEE ARTHROPLASTY Right 01/06/2016   Procedure: RIGHT  UNICOMPARTMENTAL KNEE ARTHROPLASTY;  Surgeon: Kathryne Hitch, MD;  Location: [facility] OR;  Service: Orthopedics;  Laterality: Right;  . VEIN LIGATION AND STRIPPING Bilateral     There were no vitals filed for this visit.  Subjective Assessment - 07/31/18 1132    Subjective  No pain . Getting along ok.  no complaints    Currently in Pain?  No/denies                       [facility] Adult PT Treatment/Exercise - 07/31/18 0001      Exercises   Exercises  Knee/Hip      Knee/Hip Exercises: Aerobic   Nustep  Le only L5 10 min      Knee/Hip Exercises: Seated   Sit to Sand  2 sets;10 reps;without UE support   blue therapad under hips     Knee/Hip Exercises: Supine   Bridges  15 reps    Bridges with [employer]  15 reps    Bridges with Clamshell  15 reps    Other Supine Knee/Hip Exercises  bent leg raise x 15 RT/LT       Knee/Hip Exercises: Sidelying   Clams  green band x 12  then reverse clam no band x 15             PT Education - 07/31/18 1123    Education Details  HEP    Person(s) Educated  Patient    Methods  Explanation;Tactile cues;Verbal cues;Handout    Comprehension  Verbalized understanding;Returned demonstration       PT Short Term Goals - 07/31/18 1130      PT SHORT TERM GOAL #1   Title  Pt will deomonstate independence with initial HEP    Time  3    Period  Weeks    Status  On-going      PT SHORT TERM GOAL #2   Title  pt will demonstrate tandem walking 15steps min guard     Time  3    Period  Weeks    Status  On-going      PT SHORT TERM GOAL #3   Title  She will improve BERG score to 45/56 to demo improved balance.     Status  Unable to assess      PT SHORT TERM GOAL #5   Title  pain in right knee with daily activities with incr to no more than 3/10     Baseline  no pain lately    Status  Achieved        PT Long Term Goals - 07/18/18 1133      PT LONG TERM GOAL #1   Title  pt will demonstrate independence in ongoing HEP/  Fitness program in order to continue to maintain improvement made in PT     Time  6    Period  Weeks    Status  New      PT LONG TERM GOAL #2   Title  Berg score improved to 50/56 to demo improved balance     Time  6    Period  Weeks    Status  New      PT LONG TERM GOAL #3   Title  She will demo 4/5 hip strength bilaterally to improve stability with gait and home tasks    Time  6    Period  Weeks    Status  New      PT LONG TERM GOAL #5   Title  She will be able to perform 50% more home tasks due to improved strength    Time  6    Period  Weeks    Status  New            Plan - 07/31/18 1038    Clinical Impression Statement  She is doing well with exercises without pain just fatigue,   Added to HEP.     PT Treatment/Interventions  Passive range of motion;Manual techniques;Patient/family education;Therapeutic exercise;Therapeutic activities;Cryotherapy;Moist Heat;Functional mobility training    PT Next Visit Plan  LE and core stengthening and balance work.     PT Home Exercise Plan  sit to stand, bridge, SLR , side clam , LAQ,     Consulted and Agree with Plan of Care  Patient       Patient will benefit from skilled therapeutic intervention in order to improve the following deficits and impairments:  Pain, Decreased activity tolerance, Decreased endurance, Decreased balance, Decreased strength, Difficulty walking, Increased edema  Visit Diagnosis: Muscle weakness (generalized)  Unsteadiness on feet  Abnormal posture     Problem List Patient Active Problem List   Diagnosis Date Noted  . Adhesive capsulitis of right shoulder  11/28/2017  . Left basal ganglia embolic stroke ([facility]) 07/18/2017  . Facial droop   . Right sided weakness   . Diabetes mellitus ([facility])   . Diastolic dysfunction   . Dysphagia, post-stroke   . Dysarthria, post-stroke   . Hypokalemia   . Acute blood loss anemia   . Hyperlipidemia   . Stroke ([facility]) 07/16/2017  . Hypertension 07/16/2017   . Arthritis of right hand 01/11/2017  . Osteoarthritis of right knee 01/06/2016  . Status post right partial knee replacement 01/06/2016    Caprice Red  PT 07/31/2018, 11:33 AM  [facility] [address] [place], [place], [address] Phone: [phone]   Fax:  [phone]  Name: Kristi David MRN: [id number] Date of Birth: 1941/02/07

## 2018-07-31 NOTE — Patient Instructions (Signed)
KNEE: Extension, Long Arc Quads - Sitting    Raise leg until knee is straight. 20___ reps per set, __1-2_ sets per day, __5_ days per week  Copyright  VHI. All rights reserved.  Functional Quadriceps: Sit to Stand    Sit on edge of chair, feet flat on floor. Stand upright, extending knees fully. Repeat _10-20___ times per set. Do __1-2__ sets per session. Do _1___ sessions per day.  http://orth.exer.us/735   Copyright  VHI. All rights reserved.  Abduction: Clam (Eccentric) - Side-Lying    Lie on side with knees bent. Lift top knee, keeping feet together. Keep trunk steady. Slowly lower for 3-5 seconds. 10-20___ reps per set, _1-2__ sets per day, _5__ days per week.  http://ecce.exer.us/65   Copyright  VHI. All rights reserved.  Straight Leg Raise    Tighten stomach and slowly raise locked right leg ____ inches from floor. Repeat   10-20 ____ times per set. Do _1-2___ sets per session. Do __1-2__ sessions per day.  http://orth.exer.us/1103   Copyright  VHI. All rights reserved.  Bridge    Lie back, legs bent. Inhale, pressing hips up. Keeping ribs in, lengthen lower back. Exhale, rolling down along spine from top. Repeat __10-20__ times. Do ___1-2_ sessions per day.  http://pm.exer.us/55   Copyright  VHI. All rights reserved.

## 2018-08-02 ENCOUNTER — Other Ambulatory Visit: Payer: Self-pay

## 2018-08-02 NOTE — Patient Outreach (Signed)
[facility]) Care Management  08/02/2018  Kristi David 03/06/1941 [id number]   Referral Date:08/02/18 Referral Source: MD referral Referral Reason: Medication review/adherence   Outreach Attempt: No answer at both numbers.  HIPAA compliant voice message left.     Plan: RN CM will attempt again within 4 business days and send letter.   Incoming call from patient.  Son also called at the same time.  Patient ok with merging call.  Patient able to verify HIPAA.  Patient lives alone in apartment. Patient is independent with care and able to fix meals.  Discussed [facility] services and how we could support patient.  Patient and son agree to pharmacy services at this time.    Patient states that she is managing her own medications at this time.  Son feels that patient has side effects from different medications and wants to be involved in process with pharmacy.  Patient admits to arthritis, stroke one year ago, DM, and HTN.  Patient reports that her sugars run in the 120 range and that her blood pressure is controlled and declined any further nurse support at this time.    Plan: RN CM will refer to pharmacy for medication adherence/management.     Bary Leriche, RN, MSN [facility] Care Management Care Management Coordinator Direct Line [phone] Toll Free: [phone]  Fax: [phone]

## 2018-08-03 ENCOUNTER — Ambulatory Visit: Payer: Medicare Other | Admitting: Physical Therapy

## 2018-08-04 ENCOUNTER — Ambulatory Visit: Payer: Self-pay | Admitting: Pharmacist

## 2018-08-04 ENCOUNTER — Other Ambulatory Visit: Payer: Self-pay | Admitting: Pharmacist

## 2018-08-04 NOTE — Patient Outreach (Signed)
[facility]) Care Management  [facility]  08/04/2018  Susie Ehresman 1940/10/28 [id number]   Reason for referral: medication management  Unsuccessful telephone call attempt #1 to patient.   HIPAA compliant voicemail left requesting a return call  Plan:  I will make another outreach attempt to patient within 3-4 business days  I will mail unsuccessful letter  Kieth Brightly, PharmD, [facility] Clinical Pharmacist [facility]  [phone]

## 2018-08-07 ENCOUNTER — Encounter (INDEPENDENT_AMBULATORY_CARE_PROVIDER_SITE_OTHER): Payer: Self-pay | Admitting: Orthopaedic Surgery

## 2018-08-07 ENCOUNTER — Ambulatory Visit (INDEPENDENT_AMBULATORY_CARE_PROVIDER_SITE_OTHER): Payer: Medicare Other | Admitting: Orthopaedic Surgery

## 2018-08-07 DIAGNOSIS — M25562 Pain in left knee: Secondary | ICD-10-CM | POA: Diagnosis not present

## 2018-08-07 DIAGNOSIS — M25561 Pain in right knee: Secondary | ICD-10-CM | POA: Diagnosis not present

## 2018-08-07 DIAGNOSIS — G8929 Other chronic pain: Secondary | ICD-10-CM | POA: Diagnosis not present

## 2018-08-07 NOTE — Progress Notes (Signed)
The patient is following up 6 weeks after having bilateral knee injections.  She actually has a partial knee replacement in the right knee.  She says the injections have helped well.  She is 77 years old.  She says she occasionally needs some pain medication after she exercises.  She feels like she is doing well overall.  On exam her right knee is slightly more swollen than her left knee but otherwise to both move well with no instability.  This point we will hold off any other injections to the least 3 to 4 months after her first injections.  If he gets bad enough she will call us.  Follow-up will be as needed.

## 2018-08-08 ENCOUNTER — Ambulatory Visit: Payer: Medicare Other | Admitting: Physical Therapy

## 2018-08-08 ENCOUNTER — Encounter: Payer: Self-pay | Admitting: Physical Therapy

## 2018-08-08 DIAGNOSIS — M6281 Muscle weakness (generalized): Secondary | ICD-10-CM | POA: Diagnosis not present

## 2018-08-08 DIAGNOSIS — M25561 Pain in right knee: Secondary | ICD-10-CM

## 2018-08-08 DIAGNOSIS — R2681 Unsteadiness on feet: Secondary | ICD-10-CM | POA: Diagnosis not present

## 2018-08-08 DIAGNOSIS — G8929 Other chronic pain: Secondary | ICD-10-CM

## 2018-08-08 DIAGNOSIS — M25562 Pain in left knee: Secondary | ICD-10-CM

## 2018-08-08 DIAGNOSIS — R2689 Other abnormalities of gait and mobility: Secondary | ICD-10-CM

## 2018-08-08 DIAGNOSIS — M25572 Pain in left ankle and joints of left foot: Secondary | ICD-10-CM

## 2018-08-08 DIAGNOSIS — R293 Abnormal posture: Secondary | ICD-10-CM | POA: Diagnosis not present

## 2018-08-08 NOTE — Therapy (Signed)
[place], [place], [address] Phone: [phone]   Fax:  [phone]  Physical Therapy Treatment  Patient Details  Name: Kristi David MRN: [id number] Date of Birth: 1941-02-07 Referring Provider (PT): Jean Rosenthal, MD   Encounter Date: 08/08/2018  PT End of Session - 08/08/18 1120    Visit Number  5    Number of Visits  12    Date for PT Re-Evaluation  09/01/18    Authorization Type  MCR    Authorization Time Period  Progress visit 10  KX visit 15    PT Start Time  1132    PT Stop Time  1212    PT Time Calculation (min)  40 min    Activity Tolerance  Patient tolerated treatment well       Past Medical History:  Diagnosis Date  . Acute blood loss anemia   . Anemia   . Arthritis    "legs, knees" (01/07/2016)  . Arthritis of right hand 01/11/2017  . Diabetes mellitus ([place])   . Diastolic dysfunction   . Dysarthria, post-stroke   . Dysphagia, post-stroke   . Facial droop   . GERD (gastroesophageal reflux disease)    no meds  . High cholesterol   . History of blood transfusion   . Hyperlipidemia   . Hypertension   . Hypokalemia   . Left basal ganglia embolic stroke ([place]) 29/79/8921  . Osteoarthritis of right knee 01/06/2016  . Seasonal allergies   . Shortness of breath dyspnea    if too active  . Stroke ([place]) 07/16/2017  . Syncope    passed out and was hospitalized for a couple of days  . Type II diabetes mellitus ([place])    does not take medicine diet controlled    Past Surgical History:  Procedure Laterality Date  . BUNIONECTOMY Bilateral   . CATARACT EXTRACTION W/ INTRAOCULAR LENS  IMPLANT, BILATERAL Bilateral   . INGUINAL HERNIA REPAIR Right   . KNEE ARTHROSCOPY WITH EXCISION BAKER'S CYST Right   . MR LOWER LEG LEFT ([place] HX) Left    after a break  . PARTIAL KNEE ARTHROPLASTY Right 01/06/2016   Procedure: RIGHT UNICOMPARTMENTAL KNEE ARTHROPLASTY;  Surgeon: Mcarthur Rossetti, MD;  Location: [facility];  Service: Orthopedics;  Laterality: Right;  . VEIN LIGATION AND STRIPPING Bilateral     There were no vitals filed for this visit.  Subjective Assessment - 08/08/18 1136    Subjective  Shatha reports he is doing good, only has stiffness in her ankle and knees.  Saw MD yesterday and he said to hold on injeciton, come back in January to see how she is doing. Wishes for her to continue with PT for now.     Patient Stated Goals  Walk better to do more home tasks.        Currently in Pain?  No/denies         [facility] PT Assessment - 08/08/18 0001      Assessment   Medical Diagnosis  chronic Lt ankle and bilateral knee pain      Standardized Balance Assessment   Standardized Balance Assessment  Berg Balance Test      Berg Balance Test   Sit to Stand  Able to stand without using hands and stabilize independently    Standing Unsupported  Able to stand safely 2 minutes    Sitting with Back Unsupported but Feet Supported on Floor or Stool  Able to sit  safely and securely 2 minutes    Stand to Sit  Sits safely with minimal use of hands    Transfers  Able to transfer safely, minor use of hands    Standing Unsupported with Eyes Closed  Able to stand 10 seconds safely    Standing Ubsupported with Feet Together  Able to place feet together independently and stand 1 minute safely    From Standing, Reach Forward with Outstretched Arm  Can reach confidently >25 cm (10")    From Standing Position, Pick up Object from Floor  Able to pick up shoe safely and easily    From Standing Position, Turn to Look Behind Over each Shoulder  Looks behind from both sides and weight shifts well    Turn 360 Degrees  Able to turn 360 degrees safely in 4 seconds or less   4sec to the Rt and Lt   Standing Unsupported, Alternately Place Feet on Step/Stool  Able to stand independently and safely and complete 8 steps in 20 seconds   13 sec    Standing Unsupported, One Foot in Front  Able to  plae foot ahead of the other independently and hold 30 seconds    Standing on One Leg  Tries to lift leg/unable to hold 3 seconds but remains standing independently    Total Score  52                   [facility] Adult PT Treatment/Exercise - 08/08/18 0001      Neuro Re-ed    Neuro Re-ed Details   standing in tandem with 2x10 FWD reach holding 3#, alternating feet, close supervision,sitting on blue fitter first soft form performing LAQ no UE assist, FWD/BWD leans,       Exercises   Exercises  Knee/Hip      Knee/Hip Exercises: Stretches   Passive Hamstring Stretch  Both;30 seconds   seated FWD lean   Gastroc Stretch  Both;30 seconds   VC for form to keep back foot straight     Knee/Hip Exercises: Aerobic   Nustep   L5 x 7' U/LE      Knee/Hip Exercises: Standing   SLS  3x30sec each side, next to counter with UE assist PRN               PT Short Term Goals - 08/08/18 1137      PT SHORT TERM GOAL #1   Title  Pt will deomonstate independence with initial HEP    Status  Achieved      PT SHORT TERM GOAL #2   Title  pt will demonstrate tandem walking 15steps min guard       PT SHORT TERM GOAL #3   Title  She will improve BERG score to 45/56 to demo improved balance.     Status  Achieved   52/56     PT SHORT TERM GOAL #4   Title  ----      PT SHORT TERM GOAL #5   Title  pain in right knee with daily activities with incr to no more than 3/10     Status  Achieved        PT Long Term Goals - 08/08/18 1140      PT LONG TERM GOAL #1   Title  pt will demonstrate independence in ongoing HEP/ Fitness program in order to continue to maintain improvement made in PT     Status  On-going      PT  LONG TERM GOAL #2   Title  Berg score improved to 50/56 to demo improved balance     Status  Achieved   52/56     PT LONG TERM GOAL #3   Title  She will demo 4/5 hip strength bilaterally to improve stability with gait and home tasks    Status  On-going      PT LONG  TERM GOAL #4   Title  pt dill demonstrate gait speed of >/=2.62 ft/sec safely in order to indicate a safe community level ambulator    Status  On-going      PT LONG TERM GOAL #5   Title  She will be able to perform 50% more home tasks due to improved strength    Status  Partially Met            Plan - 08/08/18 1210    Clinical Impression Statement  Kadian continues to make progress, she met both her STG and LTG for the BERG score, she is now at a level that recommends she does not need and assistive device.  She does still have some weakness in her legs and fatigues with standing exercise and balance work.  Would benefit from continued tx to work on these.     Rehab Potential  Good    PT Frequency  2x / week    PT Duration  6 weeks    PT Treatment/Interventions  Passive range of motion;Manual techniques;Patient/family education;Therapeutic exercise;Therapeutic activities;Cryotherapy;Moist Heat;Functional mobility training    PT Next Visit Plan  LE and core stengthening and balance work.     Consulted and Agree with Plan of Care  Patient       Patient will benefit from skilled therapeutic intervention in order to improve the following deficits and impairments:  Pain, Decreased activity tolerance, Decreased endurance, Decreased balance, Decreased strength, Difficulty walking, Increased edema  Visit Diagnosis: Muscle weakness (generalized)  Unsteadiness on feet  Abnormal posture  Other abnormalities of gait and mobility  Pain in left ankle and joints of left foot  Chronic pain of right knee  Chronic pain of left knee     Problem List Patient Active Problem List   Diagnosis Date Noted  . Adhesive capsulitis of right shoulder 11/28/2017  . Left basal ganglia embolic stroke ([place]) 43/32/9518  . Facial droop   . Right sided weakness   . Diabetes mellitus ([place])   . Diastolic dysfunction   . Dysphagia, post-stroke   . Dysarthria, post-stroke   . Hypokalemia   . Acute  blood loss anemia   . Hyperlipidemia   . Stroke ([place]) 07/16/2017  . Hypertension 07/16/2017  . Arthritis of right hand 01/11/2017  . Osteoarthritis of right knee 01/06/2016  . Status post right partial knee replacement 01/06/2016    Jeral Pinch PT  08/08/2018, 12:12 PM  [facility] [address] [place], [place], [address] Phone: [phone]   Fax:  [phone]  Name: Kristi David MRN: [id number] Date of Birth: 09-14-1941

## 2018-08-09 ENCOUNTER — Ambulatory Visit: Payer: Self-pay | Admitting: Pharmacist

## 2018-08-10 ENCOUNTER — Ambulatory Visit: Payer: Medicare Other

## 2018-08-10 DIAGNOSIS — M25561 Pain in right knee: Secondary | ICD-10-CM | POA: Diagnosis not present

## 2018-08-10 DIAGNOSIS — M25572 Pain in left ankle and joints of left foot: Secondary | ICD-10-CM | POA: Diagnosis not present

## 2018-08-10 DIAGNOSIS — M6281 Muscle weakness (generalized): Secondary | ICD-10-CM | POA: Diagnosis not present

## 2018-08-10 DIAGNOSIS — R2689 Other abnormalities of gait and mobility: Secondary | ICD-10-CM | POA: Diagnosis not present

## 2018-08-10 DIAGNOSIS — R2681 Unsteadiness on feet: Secondary | ICD-10-CM | POA: Diagnosis not present

## 2018-08-10 DIAGNOSIS — R293 Abnormal posture: Secondary | ICD-10-CM | POA: Diagnosis not present

## 2018-08-10 NOTE — Therapy (Signed)
[place], [place], [address] Phone: [phone]   Fax:  [phone]  Physical Therapy Treatment  Patient Details  Name: Kristi David MRN: [id number] Date of Birth: 12/12/1940 Referring Provider (PT): Jean Rosenthal, MD   Encounter Date: 08/10/2018  PT End of Session - 08/10/18 1024    Visit Number  6    Number of Visits  12    Date for PT Re-Evaluation  09/01/18    Authorization Type  MCR    Authorization Time Period  Progress visit 10  KX visit 15    PT Start Time  1025    PT Stop Time  1110    PT Time Calculation (min)  45 min    Activity Tolerance  Patient tolerated treatment well    Behavior During Therapy  [facility] for tasks assessed/performed       Past Medical History:  Diagnosis Date  . Acute blood loss anemia   . Anemia   . Arthritis    "legs, knees" (01/07/2016)  . Arthritis of right hand 01/11/2017  . Diabetes mellitus ([place])   . Diastolic dysfunction   . Dysarthria, post-stroke   . Dysphagia, post-stroke   . Facial droop   . GERD (gastroesophageal reflux disease)    no meds  . High cholesterol   . History of blood transfusion   . Hyperlipidemia   . Hypertension   . Hypokalemia   . Left basal ganglia embolic stroke ([place]) 66/44/0347  . Osteoarthritis of right knee 01/06/2016  . Seasonal allergies   . Shortness of breath dyspnea    if too active  . Stroke ([place]) 07/16/2017  . Syncope    passed out and was hospitalized for a couple of days  . Type II diabetes mellitus ([place])    does not take medicine diet controlled    Past Surgical History:  Procedure Laterality Date  . BUNIONECTOMY Bilateral   . CATARACT EXTRACTION W/ INTRAOCULAR LENS  IMPLANT, BILATERAL Bilateral   . INGUINAL HERNIA REPAIR Right   . KNEE ARTHROSCOPY WITH EXCISION BAKER'S CYST Right   . MR LOWER LEG LEFT ([place] HX) Left    after a break  . PARTIAL KNEE ARTHROPLASTY Right 01/06/2016   Procedure: RIGHT  UNICOMPARTMENTAL KNEE ARTHROPLASTY;  Surgeon: Mcarthur Rossetti, MD;  Location: [place] [facility];  Service: Orthopedics;  Laterality: Right;  . VEIN LIGATION AND STRIPPING Bilateral     There were no vitals filed for this visit.  Subjective Assessment - 08/10/18 1116    Subjective  Reports doing well with no pain just stiffness.  Feels better balance and strength    Currently in Pain?  No/denies                       [facility] Adult PT Treatment/Exercise - 08/10/18 0001      Neuro Re-ed    Neuro Re-ed Details   standing in tandem with trunk rotation RT?LT  LT  then RT foot in front. light UE contact to bars, alternating feet, close supervision, Single leg stance RT and LT 5 reps each      Knee/Hip Exercises: Aerobic   Nustep   L5 x 7' LE      Knee/Hip Exercises: Standing   SLS  3x30sec each side, next to counter with UE assist PRN, and step to RT/Lt x 15 reps RT/LT      Knee/Hip Exercises: Seated   Sit to Sand  2 sets;10  reps;without UE support   with blue foam and holding 5 pounds on chest      Knee/Hip Exercises: Supine   Bridges  15 reps    Bridges with [employer]  15 reps    Bridges with [employer]  15 reps    Other Supine Knee/Hip Exercises  bent leg raise x 15 RT/LT       Knee/Hip Exercises: Sidelying   Clams  RT/LT hip IR x 15                PT Short Term Goals - 08/08/18 1137      PT SHORT TERM GOAL #1   Title  Pt will deomonstate independence with initial HEP    Status  Achieved      PT SHORT TERM GOAL #2   Title  pt will demonstrate tandem walking 15steps min guard       PT SHORT TERM GOAL #3   Title  She will improve BERG score to 45/56 to demo improved balance.     Status  Achieved   52/56     PT SHORT TERM GOAL #4   Title  ----      PT SHORT TERM GOAL #5   Title  pain in right knee with daily activities with incr to no more than 3/10     Status  Achieved        PT Long Term Goals - 08/08/18 1140      PT LONG TERM GOAL #1    Title  pt will demonstrate independence in ongoing HEP/ Fitness program in order to continue to maintain improvement made in PT     Status  On-going      PT LONG TERM GOAL #2   Title  Berg score improved to 50/56 to demo improved balance     Status  Achieved   52/56     PT LONG TERM GOAL #3   Title  She will demo 4/5 hip strength bilaterally to improve stability with gait and home tasks    Status  On-going      PT LONG TERM GOAL #4   Title  pt dill demonstrate gait speed of >/=2.62 ft/sec safely in order to indicate a safe community level ambulator    Status  On-going      PT LONG TERM GOAL #5   Title  She will be able to perform 50% more home tasks due to improved strength    Status  Partially Met            Plan - 08/10/18 1025    Clinical Impression Statement  She continues to do well with exercise without complaint.   She is slow with doing exer but is able to complete all that is asked.   Will continue with strength and balance.     PT Treatment/Interventions  Passive range of motion;Manual techniques;Patient/family education;Therapeutic exercise;Therapeutic activities;Cryotherapy;Moist Heat;Functional mobility training    PT Next Visit Plan  LE and core stengthening and balance work.     PT Home Exercise Plan  sit to stand, bridge, SLR , side clam , LAQ,     Consulted and Agree with Plan of Care  Patient       Patient will benefit from skilled therapeutic intervention in order to improve the following deficits and impairments:  Pain, Decreased activity tolerance, Decreased endurance, Decreased balance, Decreased strength, Difficulty walking, Increased edema  Visit Diagnosis: Muscle weakness (generalized)  Unsteadiness on feet  Problem List Patient Active Problem List   Diagnosis Date Noted  . Adhesive capsulitis of right shoulder 11/28/2017  . Left basal ganglia embolic stroke ([place]) 95/05/3266  . Facial droop   . Right sided weakness   . Diabetes mellitus  ([place])   . Diastolic dysfunction   . Dysphagia, post-stroke   . Dysarthria, post-stroke   . Hypokalemia   . Acute blood loss anemia   . Hyperlipidemia   . Stroke ([place]) 07/16/2017  . Hypertension 07/16/2017  . Arthritis of right hand 01/11/2017  . Osteoarthritis of right knee 01/06/2016  . Status post right partial knee replacement 01/06/2016    Darrel Hoover  PT 08/10/2018, 11:18 AM  [facility] [address] [place], [place], [address] Phone: [phone]   Fax:  [phone]  Name: Kristi David MRN: [id number] Date of Birth: 03/10/1941

## 2018-08-11 ENCOUNTER — Ambulatory Visit: Payer: Self-pay | Admitting: Pharmacist

## 2018-08-11 ENCOUNTER — Other Ambulatory Visit: Payer: Self-pay | Admitting: Pharmacist

## 2018-08-11 NOTE — Patient Outreach (Signed)
[facility] Care Management  [facility]  08/11/2018  Jossie NgHettie Gable 04/23/1941 [id number]   Reason for referral: medication management  Unsuccessful telephone call attempt #2 to patient.   HIPAA compliant voicemail left requesting a return call on son's mobile phone  Plan:  I will make another outreach attempt to patient within 3-4 business days   Kieth BrightlyJulie Dattero [name], PharmD, [facility] Clinical Pharmacist [facility]  [phone]

## 2018-08-14 ENCOUNTER — Ambulatory Visit: Payer: Medicare Other

## 2018-08-14 DIAGNOSIS — R2681 Unsteadiness on feet: Secondary | ICD-10-CM | POA: Diagnosis not present

## 2018-08-14 DIAGNOSIS — R293 Abnormal posture: Secondary | ICD-10-CM | POA: Diagnosis not present

## 2018-08-14 DIAGNOSIS — M6281 Muscle weakness (generalized): Secondary | ICD-10-CM | POA: Diagnosis not present

## 2018-08-14 DIAGNOSIS — M25561 Pain in right knee: Secondary | ICD-10-CM | POA: Diagnosis not present

## 2018-08-14 DIAGNOSIS — M25572 Pain in left ankle and joints of left foot: Secondary | ICD-10-CM | POA: Diagnosis not present

## 2018-08-14 DIAGNOSIS — R2689 Other abnormalities of gait and mobility: Secondary | ICD-10-CM | POA: Diagnosis not present

## 2018-08-14 NOTE — Therapy (Signed)
[facility] [address] [place], [place], [address] Phone: [phone]   Fax:  [phone]  Physical Therapy Treatment  Patient Details  Name: Kristi David MRN: [id number] Date of Birth: Oct 31, 1940 Referring Provider (PT): Doneen Poisson, MD   Encounter Date: 08/14/2018  PT End of Session - 08/14/18 1002    Visit Number  7    Number of Visits  12    Date for PT Re-Evaluation  09/01/18    Authorization Type  MCR    Authorization Time Period  Progress visit 10  KX visit 15    PT Start Time  1015    PT Stop Time  1105    PT Time Calculation (min)  50 min    Activity Tolerance  Patient tolerated treatment well    Behavior During Therapy  [facility] for tasks assessed/performed       Past Medical History:  Diagnosis Date  . Acute blood loss anemia   . Anemia   . Arthritis    "legs, knees" (01/07/2016)  . Arthritis of right hand 01/11/2017  . Diabetes mellitus ([facility])   . Diastolic dysfunction   . Dysarthria, post-stroke   . Dysphagia, post-stroke   . Facial droop   . GERD (gastroesophageal reflux disease)    no meds  . High cholesterol   . History of blood transfusion   . Hyperlipidemia   . Hypertension   . Hypokalemia   . Left basal ganglia embolic stroke ([facility]) 07/18/2017  . Osteoarthritis of right knee 01/06/2016  . Seasonal allergies   . Shortness of breath dyspnea    if too active  . Stroke ([facility]) 07/16/2017  . Syncope    passed out and was hospitalized for a couple of days  . Type II diabetes mellitus ([facility])    does not take medicine diet controlled    Past Surgical History:  Procedure Laterality Date  . BUNIONECTOMY Bilateral   . CATARACT EXTRACTION W/ INTRAOCULAR LENS  IMPLANT, BILATERAL Bilateral   . INGUINAL HERNIA REPAIR Right   . KNEE ARTHROSCOPY WITH EXCISION BAKER'S CYST Right   . MR LOWER LEG LEFT ([facility] HX) Left    after a break  . PARTIAL KNEE ARTHROPLASTY Right 01/06/2016   Procedure: RIGHT  UNICOMPARTMENTAL KNEE ARTHROPLASTY;  Surgeon: Kathryne Hitch, MD;  [facility];  Service: Orthopedics;  Laterality: Right;  . VEIN LIGATION AND STRIPPING Bilateral     There were no vitals filed for this visit.      [facility] PT Assessment - 08/14/18 0001      Strength   Right Hip Flexion  4/5    Right Hip ABduction  3-/5    Left Hip Flexion  4+/5    Left Hip ABduction  3+/5    Right Knee Flexion  5/5    Right Knee Extension  4+/5   crepitus noted   Left Knee Flexion  5/5    Left Knee Extension  5/5    Right Ankle Dorsiflexion  5/5    Right Ankle Plantar Flexion  3+/5    Right Ankle Inversion  5/5    Right Ankle Eversion  5/5    Left Ankle Dorsiflexion  5/5    Left Ankle Plantar Flexion  3+/5    Left Ankle Inversion  5/5    Left Ankle Eversion  5/5      Standardized Balance Assessment   Standardized Balance Assessment  Timed Up and Go Test  Timed Up and Go Test   Normal TUG (seconds)  22    TUG Comments  below normal spped but limited fall risk.                   [facility] Adult PT Treatment/Exercise - 08/14/18 0001      Neuro Re-ed    Neuro Re-ed Details   walking in tandem  LT  then RT foot in front. light UE contact to counter,  close supervision,  side stepping and backward walking  and high knee walking . Balancing on tilt board RT to LT  With UE support.      Knee/Hip Exercises: Aerobic   Nustep   L5 x 7' LE      Knee/Hip Exercises: Standing   Heel Raises  Both;15 reps   with DF   SLS  lateral step up 4 inch step RT/LT x 15 each moderate UE assist hold to post       Knee/Hip Exercises: Supine   Bridges  20 reps    Other Supine Knee/Hip Exercises  SLR RT                PT Short Term Goals - 08/08/18 1137      PT SHORT TERM GOAL #1   Title  Pt will deomonstate independence with initial HEP    Status  Achieved      PT SHORT TERM GOAL #2   Title  pt will demonstrate tandem walking 15steps min guard       PT SHORT TERM  GOAL #3   Title  She will improve BERG score to 45/56 to demo improved balance.     Status  Achieved   52/56     PT SHORT TERM GOAL #4   Title  ----      PT SHORT TERM GOAL #5   Title  pain in right knee with daily activities with incr to no more than 3/10     Status  Achieved        PT Long Term Goals - 08/14/18 1101      Additional Long Term Goals   Additional Long Term Goals  Yes      PT LONG TERM GOAL #6   Title  timed up and go speed decr to 15 sec or less    Time  3    Period  Weeks    Status  New            Plan - 08/14/18 1003    Clinical Impression Statement  Weakness in hips bilaterally and RT quad limit walking speed, getting out of chair and balance.  Otherwise she is doing well .    PT Treatment/Interventions  Passive range of motion;Manual techniques;Patient/family education;Therapeutic exercise;Therapeutic activities;Cryotherapy;Moist Heat;Functional mobility training    PT Next Visit Plan  LE and core stengthening and balance work. work incr speed with walking    PT Home Exercise Plan  sit to stand, bridge, SLR , side clam , LAQ,     Consulted and Agree with Plan of Care  Patient       Patient will benefit from skilled therapeutic intervention in order to improve the following deficits and impairments:  Pain, Decreased activity tolerance, Decreased endurance, Decreased balance, Decreased strength, Difficulty walking, Increased edema  Visit Diagnosis: Muscle weakness (generalized)  Unsteadiness on feet     Problem List Patient Active Problem List   Diagnosis Date Noted  . Adhesive capsulitis of right  shoulder 11/28/2017  . Left basal ganglia embolic stroke ([facility]) 07/18/2017  . Facial droop   . Right sided weakness   . Diabetes mellitus ([facility])   . Diastolic dysfunction   . Dysphagia, post-stroke   . Dysarthria, post-stroke   . Hypokalemia   . Acute blood loss anemia   . Hyperlipidemia   . Stroke ([facility]) 07/16/2017  . Hypertension 07/16/2017   . Arthritis of right hand 01/11/2017  . Osteoarthritis of right knee 01/06/2016  . Status post right partial knee replacement 01/06/2016    Caprice Red PT 08/14/2018, 11:13 AM  [facility] [address] [place], [place], [address] Phone: [phone]   Fax:  [phone]  Name: Kristi David MRN: [id number] Date of Birth: 08-08-1941

## 2018-08-16 ENCOUNTER — Ambulatory Visit: Payer: Self-pay | Admitting: Pharmacist

## 2018-08-16 ENCOUNTER — Other Ambulatory Visit: Payer: Self-pay | Admitting: Pharmacist

## 2018-08-16 NOTE — Patient Outreach (Signed)
[facility]) Care Management  [facility]  08/16/2018  Kristi NgHettie David 10/31/1940 [id number]   Reason for referral: medication management  Unsuccessful telephone call attempt #3 to patient.   HIPAA compliant voicemail left requesting a return call  Plan:  I will follow-up on 10th business day from opening case.  If no response from patient at this time, I will close [name]    Kieth BrightlyJulie Dattero Hermela Hardt, PharmD, [facility] Clinical Pharmacist [facility]  [phone]

## 2018-08-17 ENCOUNTER — Ambulatory Visit: Payer: Medicare Other

## 2018-08-17 DIAGNOSIS — R2681 Unsteadiness on feet: Secondary | ICD-10-CM

## 2018-08-17 DIAGNOSIS — M25561 Pain in right knee: Secondary | ICD-10-CM | POA: Diagnosis not present

## 2018-08-17 DIAGNOSIS — M25572 Pain in left ankle and joints of left foot: Secondary | ICD-10-CM | POA: Diagnosis not present

## 2018-08-17 DIAGNOSIS — M6281 Muscle weakness (generalized): Secondary | ICD-10-CM

## 2018-08-17 DIAGNOSIS — R293 Abnormal posture: Secondary | ICD-10-CM | POA: Diagnosis not present

## 2018-08-17 DIAGNOSIS — R2689 Other abnormalities of gait and mobility: Secondary | ICD-10-CM | POA: Diagnosis not present

## 2018-08-17 NOTE — Therapy (Signed)
[facility] Outpatient Rehabilitation [facility] [address] [place], [place], [address] Phone: [phone]   Fax:  [phone]  Physical Therapy Treatment  Patient Details  Name: Kristi David MRN: [id number] Date of Birth: 02/10/1941 Referring Provider (PT): Doneen Poisson, MD   Encounter Date: 08/17/2018  PT End of Session - 08/17/18 1031    Visit Number  8    Number of Visits  12    Date for PT Re-Evaluation  09/01/18    Authorization Type  MCR    Authorization Time Period  Progress visit 10  KX visit 15    PT Start Time  1035    PT Stop Time  1120    PT Time Calculation (min)  45 min    Activity Tolerance  Patient tolerated treatment well    Behavior During Therapy  [facility] for tasks assessed/performed       Past Medical History:  Diagnosis Date  . Acute blood loss anemia   . Anemia   . Arthritis    "legs, knees" (01/07/2016)  . Arthritis of right hand 01/11/2017  . Diabetes mellitus (HCC)   . Diastolic dysfunction   . Dysarthria, post-stroke   . Dysphagia, post-stroke   . Facial droop   . GERD (gastroesophageal reflux disease)    no meds  . High cholesterol   . History of blood transfusion   . Hyperlipidemia   . Hypertension   . Hypokalemia   . Left basal ganglia embolic stroke (HCC) 07/18/2017  . Osteoarthritis of right knee 01/06/2016  . Seasonal allergies   . Shortness of breath dyspnea    if too active  . Stroke (HCC) 07/16/2017  . Syncope    passed out and was hospitalized for a couple of days  . Type II diabetes mellitus (HCC)    does not take medicine diet controlled    Past Surgical History:  Procedure Laterality Date  . BUNIONECTOMY Bilateral   . CATARACT EXTRACTION W/ INTRAOCULAR LENS  IMPLANT, BILATERAL Bilateral   . INGUINAL HERNIA REPAIR Right   . KNEE ARTHROSCOPY WITH EXCISION BAKER'S CYST Right   . MR LOWER LEG LEFT ([facility] HX) Left    after a break  . PARTIAL KNEE ARTHROPLASTY Right 01/06/2016   Procedure: RIGHT  UNICOMPARTMENTAL KNEE ARTHROPLASTY;  Surgeon: Kathryne Hitch, MD;  Location: [facility] OR;  Service: Orthopedics;  Laterality: Right;  . VEIN LIGATION AND STRIPPING Bilateral     There were no vitals filed for this visit.  Subjective Assessment - 08/17/18 1044    Subjective  No complaints today. Mild stiff.  She feels PT has been helpful.     Currently in Pain?  No/denies                       [facility] Adult PT Treatment/Exercise - 08/17/18 0001      Ambulation/Gait   Ambulation Distance (Feet)  600 Feet   asked to stop due to leg fatigue. 4 min 10 sec   Assistive device  None    Gait Pattern  Decreased arm swing - right;Decreased dorsiflexion - right;Decreased hip/knee flexion - right   decr RT knee ext end of swing   Ambulation Surface  Level;Indoor;Paved    Gait velocity  Worked on walking fast and changing speeds    gait belt used for safety      Exercises   Exercises  Knee/Hip      Knee/Hip Exercises: Aerobic   Nustep   L6  x 9 min LE      Knee/Hip Exercises: Standing   Heel Raises  Both;15 reps    SLS  lateral step up 4 inch step RT/LT x 20 each moderate UE assist hold to post       Knee/Hip Exercises: Seated   Long Arc Quad  Right;Left;15 reps    Long Arc Quad Weight  3 lbs.    Long Arc Quad Limitations  5 sec hold      Knee/Hip Exercises: Supine   Bridges  20 reps    Straight Leg Raises  Right;2 sets;10 reps    Other Supine Knee/Hip Exercises  SLR RT  x10               PT Short Term Goals - 08/08/18 1137      PT SHORT TERM GOAL #1   Title  Pt will deomonstate independence with initial HEP    Status  Achieved      PT SHORT TERM GOAL #2   Title  pt will demonstrate tandem walking 15steps min guard       PT SHORT TERM GOAL #3   Title  She will improve BERG score to 45/56 to demo improved balance.     Status  Achieved   52/56     PT SHORT TERM GOAL #4   Title  ----      PT SHORT TERM GOAL #5   Title  pain in right knee with daily  activities with incr to no more than 3/10     Status  Achieved        PT Long Term Goals - 08/14/18 1101      Additional Long Term Goals   Additional Long Term Goals  Yes      PT LONG TERM GOAL #6   Title  timed up and go speed decr to 15 sec or less    Time  3    Period  Weeks    Status  New            Plan - 08/17/18 1032    Clinical Impression Statement  Continue to work on strength and gait speed today. Her distance endurance 40% less than at last Neuro PT visit.  Discussed need to incr walking time /distance at home to improve endurance and sugested to walk legth of hall and added time before going back to appartment  to sit.   I think we will be done after next 3 sessions.  It appears some residual impairments from CVA are impacting function.    PT Treatment/Interventions  Passive range of motion;Manual techniques;Patient/family education;Therapeutic exercise;Therapeutic activities;Cryotherapy;Moist Heat;Functional mobility training    PT Next Visit Plan  LE and core stengthening and balance work. work incr speed with walking or distance    PT Home Exercise Plan  sit to stand, bridge, SLR , side clam , LAQ,     Consulted and Agree with Plan of Care  Patient       Patient will benefit from skilled therapeutic intervention in order to improve the following deficits and impairments:  Pain, Decreased activity tolerance, Decreased endurance, Decreased balance, Decreased strength, Difficulty walking, Increased edema  Visit Diagnosis: Muscle weakness (generalized)  Unsteadiness on feet     Problem List Patient Active Problem List   Diagnosis Date Noted  . Adhesive capsulitis of right shoulder 11/28/2017  . Left basal ganglia embolic stroke (HCC) 07/18/2017  . Facial droop   . Right sided weakness   .  Diabetes mellitus (HCC)   . Diastolic dysfunction   . Dysphagia, post-stroke   . Dysarthria, post-stroke   . Hypokalemia   . Acute blood loss anemia   .  Hyperlipidemia   . Stroke (HCC) 07/16/2017  . Hypertension 07/16/2017  . Arthritis of right hand 01/11/2017  . Osteoarthritis of right knee 01/06/2016  . Status post right partial knee replacement 01/06/2016    Caprice Red  PT 08/17/2018, 12:18 PM  [facility] Outpatient Rehabilitation [facility] [address] [place], [place], [address] Phone: [phone]   Fax:  [phone]  Name: Kristi David MRN: [id number] Date of Birth: 1941/04/23

## 2018-08-18 ENCOUNTER — Other Ambulatory Visit: Payer: Self-pay | Admitting: Pharmacist

## 2018-08-18 ENCOUNTER — Ambulatory Visit: Payer: Self-pay | Admitting: Pharmacist

## 2018-08-18 NOTE — Patient Outreach (Signed)
[facility] Care Management  [facility]  08/18/2018  Jossie NgHettie Soledad 08/22/1941 [id number]   Reason for referral: medication management   3 Unsuccessful telephone call attempts to patient.    Plan:  I will close [facility] case at this time as I have been unable to establish and/or maintain contact with patient    Kieth BrightlyJulie Dattero Elysse Polidore, PharmD, [facility] Clinical Pharmacist [facility]  [phone]

## 2018-08-21 ENCOUNTER — Ambulatory Visit: Payer: Medicare Other

## 2018-08-21 DIAGNOSIS — M6281 Muscle weakness (generalized): Secondary | ICD-10-CM

## 2018-08-21 DIAGNOSIS — M25561 Pain in right knee: Secondary | ICD-10-CM | POA: Diagnosis not present

## 2018-08-21 DIAGNOSIS — R2681 Unsteadiness on feet: Secondary | ICD-10-CM | POA: Diagnosis not present

## 2018-08-21 DIAGNOSIS — R2689 Other abnormalities of gait and mobility: Secondary | ICD-10-CM | POA: Diagnosis not present

## 2018-08-21 DIAGNOSIS — R293 Abnormal posture: Secondary | ICD-10-CM | POA: Diagnosis not present

## 2018-08-21 DIAGNOSIS — M25572 Pain in left ankle and joints of left foot: Secondary | ICD-10-CM | POA: Diagnosis not present

## 2018-08-21 NOTE — Therapy (Signed)
[facility] Outpatient Rehabilitation [facility] [address] [place], [place], [address] Phone: [phone]   Fax:  [phone]  Physical Therapy Treatment  Patient Details  Name: Kristi David MRN: [id number] Date of Birth: 15-Mar-1941 Referring Provider (PT): Doneen Poisson, MD   Encounter Date: 08/21/2018  PT End of Session - 08/21/18 1334    Visit Number  9    Number of Visits  12    Date for PT Re-Evaluation  09/01/18    Authorization Type  MCR    Authorization Time Period  Progress visit 10  KX visit 15    PT Start Time  0130    PT Stop Time  0215    PT Time Calculation (min)  45 min    Activity Tolerance  Patient tolerated treatment well    Behavior During Therapy  [facility] for tasks assessed/performed       Past Medical History:  Diagnosis Date  . Acute blood loss anemia   . Anemia   . Arthritis    "legs, knees" (01/07/2016)  . Arthritis of right hand 01/11/2017  . Diabetes mellitus (HCC)   . Diastolic dysfunction   . Dysarthria, post-stroke   . Dysphagia, post-stroke   . Facial droop   . GERD (gastroesophageal reflux disease)    no meds  . High cholesterol   . History of blood transfusion   . Hyperlipidemia   . Hypertension   . Hypokalemia   . Left basal ganglia embolic stroke (HCC) 07/18/2017  . Osteoarthritis of right knee 01/06/2016  . Seasonal allergies   . Shortness of breath dyspnea    if too active  . Stroke (HCC) 07/16/2017  . Syncope    passed out and was hospitalized for a couple of days  . Type II diabetes mellitus (HCC)    does not take medicine diet controlled    Past Surgical History:  Procedure Laterality Date  . BUNIONECTOMY Bilateral   . CATARACT EXTRACTION W/ INTRAOCULAR LENS  IMPLANT, BILATERAL Bilateral   . INGUINAL HERNIA REPAIR Right   . KNEE ARTHROSCOPY WITH EXCISION BAKER'S CYST Right   . MR LOWER LEG LEFT ([facility] HX) Left    after a break  . PARTIAL KNEE ARTHROPLASTY Right 01/06/2016   Procedure: RIGHT  UNICOMPARTMENTAL KNEE ARTHROPLASTY;  Surgeon: Kathryne Hitch, MD;  Location: [facility] OR;  Service: Orthopedics;  Laterality: Right;  . VEIN LIGATION AND STRIPPING Bilateral     There were no vitals filed for this visit.      [facility] PT Assessment - 08/21/18 0001      Strength   Overall Strength Comments  RT hip 3-/5 abduction , flexion 3+/5  extension 3+/5    Lt hip  4-/5 abduction 4/5 flexion 3+/5 ext                       [facility] Adult PT Treatment/Exercise - 08/21/18 0001      Ambulation/Gait   Gait velocity  Worked on walking fast and changing speeds    gait belt used for safety      Knee/Hip Exercises: Aerobic   Nustep   L6 x 6 min LE      Knee/Hip Exercises: Standing   Heel Raises  Both;15 reps    SLS  forward step ups x 15 RT/LT hip abduction x 15 RT/ LT alternating       Knee/Hip Exercises: Seated   Long Arc [employer]    Long Arc [employer]  Weight  5 lbs.    Long Arc Quad Limitations  5 sec hold  25 reps       Knee/Hip Exercises: Supine   Bridges  --   25 reps   Straight Leg Raises  Right;Left;10 reps      Knee/Hip Exercises: Sidelying   Clams  RTR/LT x 15 then SLR x12 RT /Lt                PT Short Term Goals - 08/08/18 1137      PT SHORT TERM GOAL #1   Title  Pt will deomonstate independence with initial HEP    Status  Achieved      PT SHORT TERM GOAL #2   Title  pt will demonstrate tandem walking 15steps min guard       PT SHORT TERM GOAL #3   Title  She will improve BERG score to 45/56 to demo improved balance.     Status  Achieved   52/56     PT SHORT TERM GOAL #4   Title  ----      PT SHORT TERM GOAL #5   Title  pain in right knee with daily activities with incr to no more than 3/10     Status  Achieved        PT Long Term Goals - 08/14/18 1101      Additional Long Term Goals   Additional Long Term Goals  Yes      PT LONG TERM GOAL #6   Title  timed up and go speed decr to 15 sec or less    Time  3    Period   Weeks    Status  New            Plan - 08/21/18 1335    Clinical Impression Statement  Continiued hip weakness . Will see through next week then discharge with HEP.     PT Treatment/Interventions  Passive range of motion;Manual techniques;Patient/family education;Therapeutic exercise;Therapeutic activities;Cryotherapy;Moist Heat;Functional mobility training    PT Next Visit Plan  LE and core stengthening and balance work. work incr speed with walking or distance    PT Home Exercise Plan  sit to stand, bridge, SLR , side clam , LAQ,     Consulted and Agree with Plan of Care  Patient       Patient will benefit from skilled therapeutic intervention in order to improve the following deficits and impairments:  Pain, Decreased activity tolerance, Decreased endurance, Decreased balance, Decreased strength, Difficulty walking, Increased edema  Visit Diagnosis: Muscle weakness (generalized)  Unsteadiness on feet     Problem List Patient Active Problem List   Diagnosis Date Noted  . Adhesive capsulitis of right shoulder 11/28/2017  . Left basal ganglia embolic stroke (HCC) 07/18/2017  . Facial droop   . Right sided weakness   . Diabetes mellitus (HCC)   . Diastolic dysfunction   . Dysphagia, post-stroke   . Dysarthria, post-stroke   . Hypokalemia   . Acute blood loss anemia   . Hyperlipidemia   . Stroke (HCC) 07/16/2017  . Hypertension 07/16/2017  . Arthritis of right hand 01/11/2017  . Osteoarthritis of right knee 01/06/2016  . Status post right partial knee replacement 01/06/2016    Caprice RedChasse, Sanav Remer M PT 08/21/2018, 2:14 PM  [facility] [address] [place], [place], [address] Phone: [phone]   Fax:  [phone]  Name: Kristi David MRN: [id number] Date of Birth: 02/26/1941

## 2018-08-28 ENCOUNTER — Ambulatory Visit: Payer: Medicare Other | Attending: Orthopaedic Surgery

## 2018-08-28 DIAGNOSIS — R2681 Unsteadiness on feet: Secondary | ICD-10-CM | POA: Diagnosis not present

## 2018-08-28 DIAGNOSIS — M25561 Pain in right knee: Secondary | ICD-10-CM | POA: Insufficient documentation

## 2018-08-28 DIAGNOSIS — G8929 Other chronic pain: Secondary | ICD-10-CM | POA: Insufficient documentation

## 2018-08-28 DIAGNOSIS — M6281 Muscle weakness (generalized): Secondary | ICD-10-CM | POA: Diagnosis not present

## 2018-08-28 DIAGNOSIS — R293 Abnormal posture: Secondary | ICD-10-CM | POA: Insufficient documentation

## 2018-08-28 DIAGNOSIS — R2689 Other abnormalities of gait and mobility: Secondary | ICD-10-CM | POA: Diagnosis not present

## 2018-08-28 DIAGNOSIS — M25572 Pain in left ankle and joints of left foot: Secondary | ICD-10-CM | POA: Diagnosis not present

## 2018-08-28 NOTE — Patient Instructions (Signed)
HIP: Abduction - Side-Lying    Lie on side, legs straight and in line with trunk. Squeeze glutes. Raise top leg up and slightly back. Point toes forward. ___ reps per set, ___ sets per day, ___ days per week Bend bottom leg to stabilize pelvis.  Copyright  VHI. All rights reserved.  Flexion and Extension - Supine    Raise straight uninvolved leg toward ceiling ___ times. Repeat with other leg and hip. Do ___ times per day.  Copyright  VHI. All rights reserved.  Hip Flexion / Extension    Stand with support and raise leg to 90 bend. Hold 1 count. Then extend leg back just off floor, keeping it straight. Slowly return to starting position. Repeat ____ times each leg.  Copyright  VHI. All rights reserved.  HIP: Flexion / KNEE: Extension, Straight Leg Raise    Raise leg, keeping knee straight. Perform slowly. ___ reps per set, ___ sets per day, ___ days per week   Copyright  VHI. All rights reserved.  Do all 10-15 reps 1x/day 4-5x per week

## 2018-08-28 NOTE — Therapy (Signed)
[place], [place], [address] Phone: [phone]   Fax:  [phone]  Physical Therapy Treatment  Patient Details  Name: Kristi David MRN: [id number] Date of Birth: 02/06/1941 Referring Provider (PT): Jean Rosenthal, MD Progress Note Reporting Period 07/18/18 to 08/28/18  See note below for Objective Data and Assessment of Progress/Goals.       Encounter Date: 08/28/2018  PT End of Session - 08/28/18 1113    Visit Number  11    Number of Visits  12    Date for PT Re-Evaluation  09/01/18    Authorization Type  MCR    Authorization Time Period  Progress visit 10  KX visit 15    PT Start Time  1055    PT Stop Time  1140    PT Time Calculation (min)  45 min    Activity Tolerance  Patient tolerated treatment well    Behavior During Therapy  [facility] for tasks assessed/performed       Past Medical History:  Diagnosis Date  . Acute blood loss anemia   . Anemia   . Arthritis    "legs, knees" (01/07/2016)  . Arthritis of right hand 01/11/2017  . Diabetes mellitus ([place])   . Diastolic dysfunction   . Dysarthria, post-stroke   . Dysphagia, post-stroke   . Facial droop   . GERD (gastroesophageal reflux disease)    no meds  . High cholesterol   . History of blood transfusion   . Hyperlipidemia   . Hypertension   . Hypokalemia   . Left basal ganglia embolic stroke ([place]) 20/25/4270  . Osteoarthritis of right knee 01/06/2016  . Seasonal allergies   . Shortness of breath dyspnea    if too active  . Stroke ([place]) 07/16/2017  . Syncope    passed out and was hospitalized for a couple of days  . Type II diabetes mellitus ([place])    does not take medicine diet controlled    Past Surgical History:  Procedure Laterality Date  . BUNIONECTOMY Bilateral   . CATARACT EXTRACTION W/ INTRAOCULAR LENS  IMPLANT, BILATERAL Bilateral   . INGUINAL HERNIA REPAIR Right   . KNEE ARTHROSCOPY WITH EXCISION BAKER'S CYST  Right   . MR LOWER LEG LEFT ([place] HX) Left    after a break  . PARTIAL KNEE ARTHROPLASTY Right 01/06/2016   Procedure: RIGHT UNICOMPARTMENTAL KNEE ARTHROPLASTY;  Surgeon: Mcarthur Rossetti, MD;  Location: [facility];  Service: Orthopedics;  Laterality: Right;  . VEIN LIGATION AND STRIPPING Bilateral     There were no vitals filed for this visit.  Subjective Assessment - 08/28/18 1107    Subjective  No complaints today. Mild stiff.  She feels PT has been helpful. No problems over the weekend/holiday    Currently in Pain?  No/denies                       [facility] Adult PT Treatment/Exercise - 08/28/18 0001      Ambulation/Gait   Gait velocity  2.3 feet per sec,  then timed up and go  timed up and go 22 sec      Knee/Hip Exercises: Aerobic   Nustep   L6 x 6 min LE      Knee/Hip Exercises: Seated   Long Arc Quad Limitations  5 sec hold  25 reps       Knee/Hip Exercises: Supine   Bridges  20 reps    Straight  Leg Raises  Right;Left;2 sets;10 reps    Other Supine Knee/Hip Exercises  bent leg raise x 15 RT/LT       Knee/Hip Exercises: Sidelying   Clams  RTR/LT x 15 then SLR x12 RT /Lt        Spent last 15 min with HEP      PT Education - 08/28/18 1145    Education Details  HEP    Person(s) Educated  Patient    Methods  Explanation;Demonstration;Handout;Tactile cues    Comprehension  Returned demonstration;Verbalized understanding       PT Short Term Goals - 08/08/18 1137      PT SHORT TERM GOAL #1   Title  Pt will deomonstate independence with initial HEP    Status  Achieved      PT SHORT TERM GOAL #2   Title  pt will demonstrate tandem walking 15steps min guard       PT SHORT TERM GOAL #3   Title  She will improve BERG score to 45/56 to demo improved balance.     Status  Achieved   52/56     PT SHORT TERM GOAL #4   Title  ----      PT SHORT TERM GOAL #5   Title  pain in right knee with daily activities with incr to no more than 3/10     Status   Achieved        PT Long Term Goals - 08/28/18 1114      PT LONG TERM GOAL #1   Title  pt will demonstrate independence in ongoing HEP/ Fitness program in order to continue to maintain improvement made in PT     Status  On-going      PT LONG TERM GOAL #2   Title  Berg score improved to 50/56 to demo improved balance     Status  Achieved      PT LONG TERM GOAL #3   Title  She will demo 4/5 hip strength bilaterally to improve stability with gait and home tasks    Status  On-going      PT LONG TERM GOAL #5   Title  She will be able to perform 50% more home tasks due to improved strength    Status  Partially Met      PT LONG TERM GOAL #6   Title  timed up and go speed decr to 15 sec or less            Plan - 08/28/18 1044    Clinical Impression Statement  Plan next visit discharge. Hope she will continue to exercise at home as she needs to build hip strength for future function on feet    PT Treatment/Interventions  Passive range of motion;Manual techniques;Patient/family education;Therapeutic exercise;Therapeutic activities;Cryotherapy;Moist Heat;Functional mobility training    PT Next Visit Plan  discharge next visit    PT Home Exercise Plan  sit to stand, bridge, SLR , side clam , LAQ,  side lye abduction, bent knee raise    Consulted and Agree with Plan of Care  Patient       Patient will benefit from skilled therapeutic intervention in order to improve the following deficits and impairments:  Pain, Decreased activity tolerance, Decreased endurance, Decreased balance, Decreased strength, Difficulty walking, Increased edema  Visit Diagnosis: Muscle weakness (generalized)  Unsteadiness on feet     Problem List Patient Active Problem List   Diagnosis Date Noted  . Adhesive capsulitis of right shoulder  11/28/2017  . Left basal ganglia embolic stroke ([place]) 69/45/0388  . Facial droop   . Right sided weakness   . Diabetes mellitus ([place])   . Diastolic dysfunction    . Dysphagia, post-stroke   . Dysarthria, post-stroke   . Hypokalemia   . Acute blood loss anemia   . Hyperlipidemia   . Stroke ([place]) 07/16/2017  . Hypertension 07/16/2017  . Arthritis of right hand 01/11/2017  . Osteoarthritis of right knee 01/06/2016  . Status post right partial knee replacement 01/06/2016    Darrel Hoover  PT 08/28/2018, 11:57 AM  [facility] [address] [place], [place], [address] Phone: [phone]   Fax:  [phone]  Name: Josue Kass MRN: [id number] Date of Birth: April 09, 1941

## 2018-08-30 ENCOUNTER — Ambulatory Visit: Payer: Medicare Other

## 2018-09-06 ENCOUNTER — Ambulatory Visit: Payer: Medicare Other | Admitting: Physical Therapy

## 2018-09-06 ENCOUNTER — Encounter: Payer: Self-pay | Admitting: Physical Therapy

## 2018-09-06 DIAGNOSIS — M6281 Muscle weakness (generalized): Secondary | ICD-10-CM

## 2018-09-06 DIAGNOSIS — G8929 Other chronic pain: Secondary | ICD-10-CM

## 2018-09-06 DIAGNOSIS — M25572 Pain in left ankle and joints of left foot: Secondary | ICD-10-CM | POA: Diagnosis not present

## 2018-09-06 DIAGNOSIS — R2689 Other abnormalities of gait and mobility: Secondary | ICD-10-CM

## 2018-09-06 DIAGNOSIS — M25561 Pain in right knee: Secondary | ICD-10-CM | POA: Diagnosis not present

## 2018-09-06 DIAGNOSIS — R2681 Unsteadiness on feet: Secondary | ICD-10-CM | POA: Diagnosis not present

## 2018-09-06 DIAGNOSIS — R293 Abnormal posture: Secondary | ICD-10-CM | POA: Diagnosis not present

## 2018-09-06 NOTE — Therapy (Signed)
[place], [place], [address] Phone: [phone]   Fax:  [phone]  Physical Therapy Treatment/Discharge  Patient Details  Name: Kristi David MRN: [id number] Date of Birth: 08-01-1941 Referring Provider (PT): Jean Rosenthal, MD   Encounter Date: 09/06/2018  PT End of Session - 09/06/18 1102    Visit Number  12    Authorization Type  MCR    PT Start Time  9924    PT Stop Time  1143    PT Time Calculation (min)  40 min    Activity Tolerance  Patient tolerated treatment well       Past Medical History:  Diagnosis Date  . Acute blood loss anemia   . Anemia   . Arthritis    "legs, knees" (01/07/2016)  . Arthritis of right hand 01/11/2017  . Diabetes mellitus ([place])   . Diastolic dysfunction   . Dysarthria, post-stroke   . Dysphagia, post-stroke   . Facial droop   . GERD (gastroesophageal reflux disease)    no meds  . High cholesterol   . History of blood transfusion   . Hyperlipidemia   . Hypertension   . Hypokalemia   . Left basal ganglia embolic stroke ([place]) 26/83/4196  . Osteoarthritis of right knee 01/06/2016  . Seasonal allergies   . Shortness of breath dyspnea    if too active  . Stroke ([place]) 07/16/2017  . Syncope    passed out and was hospitalized for a couple of days  . Type II diabetes mellitus ([place])    does not take medicine diet controlled    Past Surgical History:  Procedure Laterality Date  . BUNIONECTOMY Bilateral   . CATARACT EXTRACTION W/ INTRAOCULAR LENS  IMPLANT, BILATERAL Bilateral   . INGUINAL HERNIA REPAIR Right   . KNEE ARTHROSCOPY WITH EXCISION BAKER'S CYST Right   . MR LOWER LEG LEFT ([place] HX) Left    after a break  . PARTIAL KNEE ARTHROPLASTY Right 01/06/2016   Procedure: RIGHT UNICOMPARTMENTAL KNEE ARTHROPLASTY;  Surgeon: Mcarthur Rossetti, MD;  Location: [facility];  Service: Orthopedics;  Laterality: Right;  . VEIN LIGATION AND STRIPPING Bilateral      There were no vitals filed for this visit.  Subjective Assessment - 09/06/18 1106    Subjective  Pt reports that Kristi David is ready for today being her last visit. Her son is having back surgery and Kristi David will be helping care for him.     Currently in Pain?  No/denies         [facility] PT Assessment - 09/06/18 0001      Assessment   Medical Diagnosis  chronic Lt ankle and bilateral knee pain    Referring Provider (PT)  Jean Rosenthal, MD      Strength   Strength Assessment Site  Hip    Right/Left Hip  Left;Right    Right Hip Flexion  4+/5    Right Hip Extension  4/5    Right Hip ABduction  4/5    Left Hip Flexion  5/5    Left Hip Extension  4+/5    Left Hip ABduction  5/5      Ambulation/Gait   Gait velocity  TUG 13 sec                   [facility] Adult PT Treatment/Exercise - 09/06/18 0001      Exercises   Exercises  Knee/Hip      Knee/Hip Exercises: Aerobic  Nustep   L5 x 6 min LE      Knee/Hip Exercises: Standing   Other Standing Knee Exercises  side stepping and BWD gait for strength and balance      Knee/Hip Exercises: Supine   Bridges  20 reps               PT Short Term Goals - 09/06/18 1107      PT SHORT TERM GOAL #1   Title  Pt will deomonstate independence with initial HEP    Status  Achieved      PT SHORT TERM GOAL #2   Title  pt will demonstrate tandem walking 15steps min guard     Status  Not Met   required 2 episodes of mod A.      PT SHORT TERM GOAL #3   Title  Kristi David will improve BERG score to 45/56 to demo improved balance.     Status  Achieved      PT SHORT TERM GOAL #4   Baseline  11/24/17: Pt able to ambulate 650' without AD performing naming task with continual gait with supervision.     Status  Achieved      PT SHORT TERM GOAL #5   Title  pain in right knee with daily activities with incr to no more than 3/10     Status  Achieved        PT Long Term Goals - 09/06/18 1112      PT LONG TERM GOAL #1   Title  pt  will demonstrate independence in ongoing HEP/ Fitness program in order to continue to maintain improvement made in PT     Status  Achieved      PT LONG TERM GOAL #2   Title  Berg score improved to 50/56 to demo improved balance     Status  Achieved   52/56     PT LONG TERM GOAL #3   Title  Kristi David will demo 4/5 hip strength bilaterally to improve stability with gait and home tasks    Status  Achieved      PT LONG TERM GOAL #4   Title  pt dill demonstrate gait speed of >/=2.62 ft/sec safely in order to indicate a safe community level ambulator    Status  Achieved      PT LONG TERM GOAL #5   Title  Kristi David will be able to perform 50% more home tasks due to improved strength    Status  Achieved      PT LONG TERM GOAL #6   Title  timed up and go speed decr to 15 sec or less    Status  Achieved   13 sec           Plan - 09/06/18 1153    Clinical Impression Statement  Kristi David has done very well with PT.  Kristi David has met all but one of her goals.  Kristi David has significant improvment in LE strength, balance and gait.  Pt reports Kristi David participates in exercise at her residence twice a week and is trying to perform her HEP on the other days.  Kristi David is ready for discharge and should do well as long as Kristi David continues with her HEP     Rehab Potential  Good    PT Next Visit Plan  discharge to HEP    Consulted and Agree with Plan of Care  Patient       Patient will benefit from skilled therapeutic intervention  in order to improve the following deficits and impairments:  Pain, Decreased activity tolerance, Decreased endurance, Decreased balance, Decreased strength, Difficulty walking, Increased edema  Visit Diagnosis: Muscle weakness (generalized)  Unsteadiness on feet  Abnormal posture  Other abnormalities of gait and mobility  Pain in left ankle and joints of left foot  Chronic pain of right knee     Problem List Patient Active Problem List   Diagnosis Date Noted  . Adhesive capsulitis of  right shoulder 11/28/2017  . Left basal ganglia embolic stroke ([place]) 50/05/3817  . Facial droop   . Right sided weakness   . Diabetes mellitus ([place])   . Diastolic dysfunction   . Dysphagia, post-stroke   . Dysarthria, post-stroke   . Hypokalemia   . Acute blood loss anemia   . Hyperlipidemia   . Stroke ([place]) 07/16/2017  . Hypertension 07/16/2017  . Arthritis of right hand 01/11/2017  . Osteoarthritis of right knee 01/06/2016  . Status post right partial knee replacement 01/06/2016    Jeral Pinch PT  09/06/2018, 11:55 AM  [facility] [address] [place], [place], [address] Phone: [phone]   Fax:  [phone]  Name: Donald Jacque MRN: [id number] Date of Birth: 1940-12-24   PHYSICAL THERAPY DISCHARGE SUMMARY  Visits from Start of Care:12  Current functional level related to goals / functional outcomes: See above for current strength and balance scores    Remaining deficits: none   Education / Equipment: HEP Plan: Patient agrees to discharge.  Patient goals were partially met.Only one goal not met and that was for tandem gait.  Patient is being discharged due to being pleased with the current functional level.  ?????    Jeral Pinch, PT 09/06/18 11:57 AM

## 2018-09-12 ENCOUNTER — Ambulatory Visit: Payer: Medicare Other | Admitting: Physical Therapy

## 2018-09-24 IMAGING — CT CT ANGIO NECK
2 of 7 series · 8 of 33 positions shown · IV contrast (APPLIED)
Comparison: MRI brain 07/17/2017.  CT head 07/16/2017.

CLINICAL DATA: Acute LEFT MCA territory lenticulostriate infarct.
Continued surveillance.

EXAM:
CT ANGIOGRAPHY NECK
TECHNIQUE: Multidetector CT imaging of the neck was performed using the
standard protocol during bolus administration of intravenous
contrast. Multiplanar CT image reconstructions and MIPs were
obtained to evaluate the vascular anatomy. Carotid stenosis
measurements (when applicable) are obtained utilizing NASCET
criteria, using the distal internal carotid diameter as the
denominator.
CONTRAST:  Isovue 370, 50 mL.

[Series 6: cta neck · axial · 0.49mm/px · z∈[-297,-217]mm · 2 of 120 slices shown]
[im 40/120  soft-tissue]
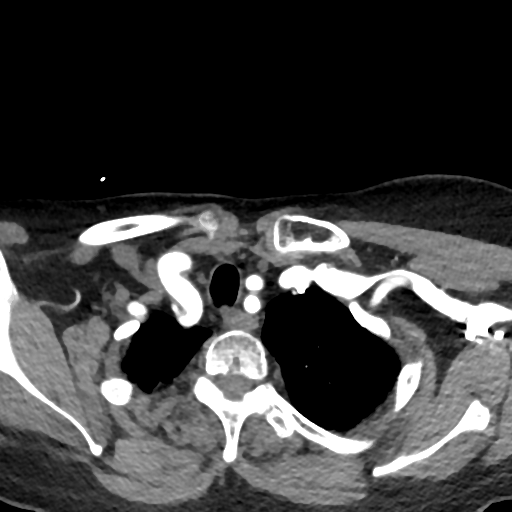
[im 80/120  soft-tissue]
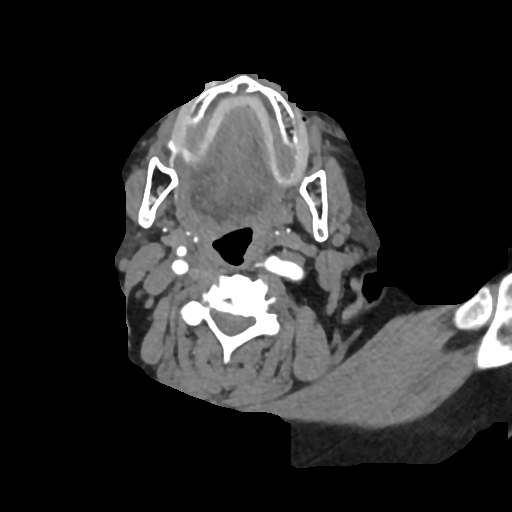

[Series 8: ax thins · axial · 0.39mm/px · z∈[-340,-170]mm · 6 of 238 slices shown]
[im 34/238  soft-tissue]
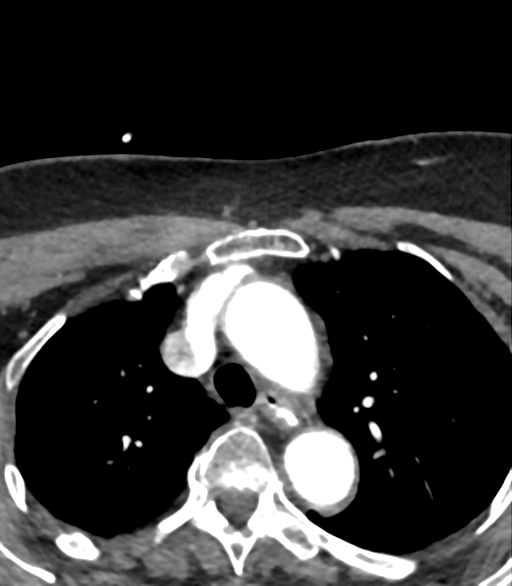
[im 68/238  bone]
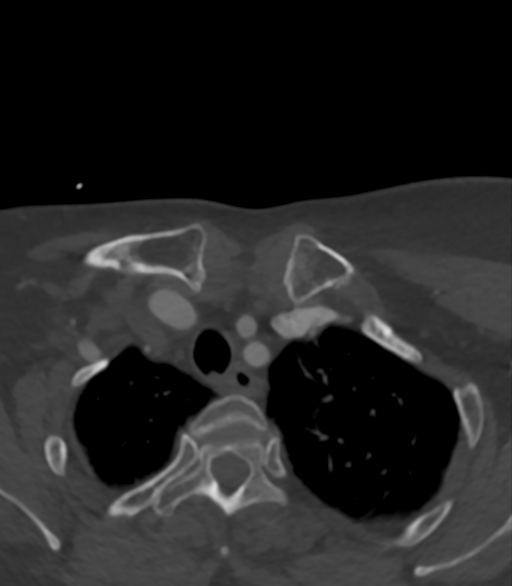
[im 102/238  soft-tissue]
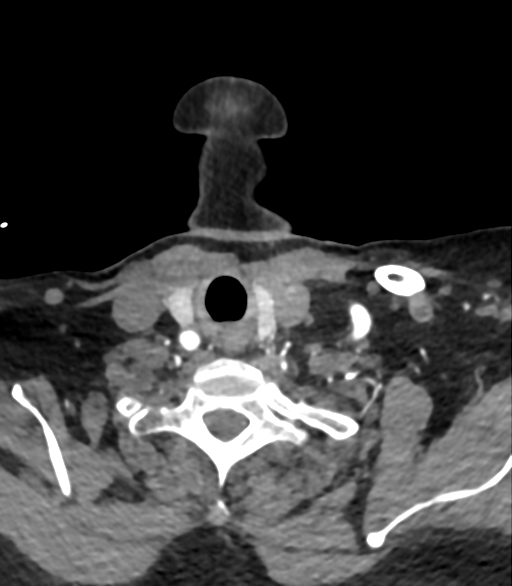
[im 136/238  bone]
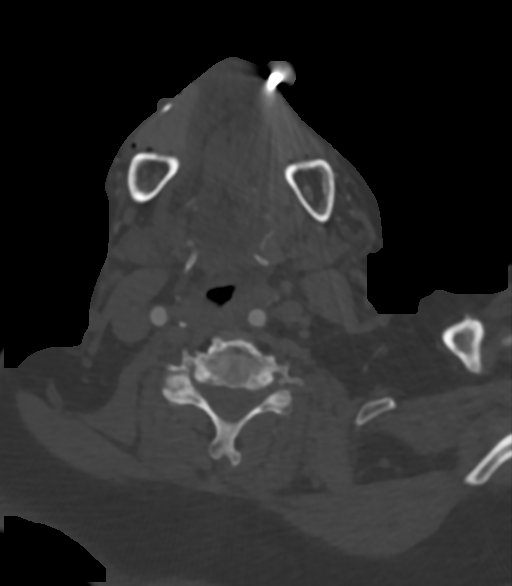
[im 170/238  soft-tissue]
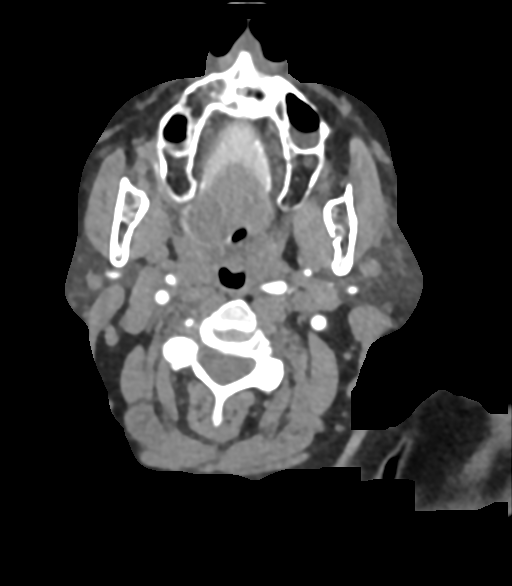
[im 204/238  bone]
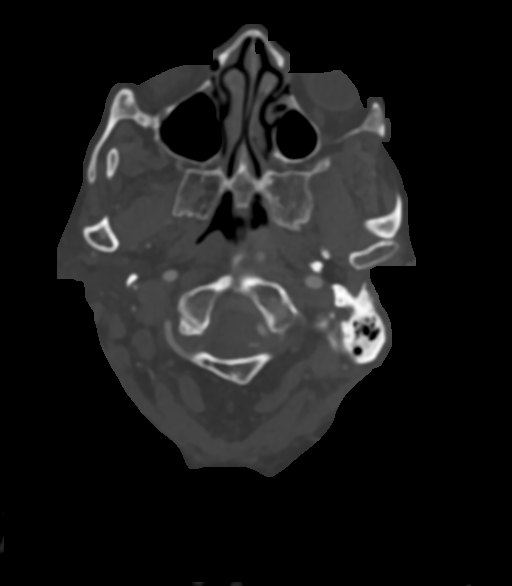

[8 of 33 positions shown; findings below may reference images not displayed]

FINDINGS: Aortic arch: Dolichoectasia.  No proximal stenosis.

Right carotid system: No evidence of dissection, stenosis (50% or
greater) or occlusion. Minor atheromatous change.

Left carotid system: No evidence of dissection, stenosis (50% or
greater) or occlusion. Minor atheromatous change.

Vertebral arteries: Codominant. No evidence of dissection, stenosis
(50% or greater) or occlusion.

Skeleton: Spondylosis.  Edentulous.

Other neck: No masses.

Upper chest: No pneumothorax or nodules.
IMPRESSION: Minor carotid bifurcation atherosclerosis. No extracranial flow
reducing lesion lesion or dissection.

## 2018-10-11 ENCOUNTER — Other Ambulatory Visit (INDEPENDENT_AMBULATORY_CARE_PROVIDER_SITE_OTHER): Payer: Self-pay | Admitting: Physician Assistant

## 2018-10-11 NOTE — Telephone Encounter (Signed)
Please advise 

## 2018-10-25 DIAGNOSIS — K582 Mixed irritable bowel syndrome: Secondary | ICD-10-CM | POA: Diagnosis not present

## 2018-10-25 DIAGNOSIS — N3941 Urge incontinence: Secondary | ICD-10-CM | POA: Diagnosis not present

## 2018-10-25 DIAGNOSIS — I10 Essential (primary) hypertension: Secondary | ICD-10-CM | POA: Diagnosis not present

## 2018-10-25 DIAGNOSIS — R69 Illness, unspecified: Secondary | ICD-10-CM | POA: Diagnosis not present

## 2018-10-25 DIAGNOSIS — I69351 Hemiplegia and hemiparesis following cerebral infarction affecting right dominant side: Secondary | ICD-10-CM | POA: Diagnosis not present

## 2018-10-25 DIAGNOSIS — E119 Type 2 diabetes mellitus without complications: Secondary | ICD-10-CM | POA: Diagnosis not present

## 2018-10-25 DIAGNOSIS — N3281 Overactive bladder: Secondary | ICD-10-CM | POA: Diagnosis not present

## 2018-10-25 DIAGNOSIS — E785 Hyperlipidemia, unspecified: Secondary | ICD-10-CM | POA: Diagnosis not present

## 2018-10-25 DIAGNOSIS — M1712 Unilateral primary osteoarthritis, left knee: Secondary | ICD-10-CM | POA: Diagnosis not present

## 2018-10-25 DIAGNOSIS — E663 Overweight: Secondary | ICD-10-CM | POA: Diagnosis not present

## 2018-11-23 DIAGNOSIS — K589 Irritable bowel syndrome without diarrhea: Secondary | ICD-10-CM | POA: Diagnosis not present

## 2018-11-23 DIAGNOSIS — M1990 Unspecified osteoarthritis, unspecified site: Secondary | ICD-10-CM | POA: Diagnosis not present

## 2018-11-23 DIAGNOSIS — I10 Essential (primary) hypertension: Secondary | ICD-10-CM | POA: Diagnosis not present

## 2018-11-23 DIAGNOSIS — G629 Polyneuropathy, unspecified: Secondary | ICD-10-CM | POA: Diagnosis not present

## 2018-11-23 DIAGNOSIS — I700 Atherosclerosis of aorta: Secondary | ICD-10-CM | POA: Diagnosis not present

## 2018-11-23 DIAGNOSIS — N3281 Overactive bladder: Secondary | ICD-10-CM | POA: Diagnosis not present

## 2018-11-23 DIAGNOSIS — E7800 Pure hypercholesterolemia, unspecified: Secondary | ICD-10-CM | POA: Diagnosis not present

## 2019-01-09 ENCOUNTER — Other Ambulatory Visit (INDEPENDENT_AMBULATORY_CARE_PROVIDER_SITE_OTHER): Payer: Self-pay | Admitting: Physician Assistant

## 2019-01-09 NOTE — Telephone Encounter (Signed)
Please advise 

## 2019-02-20 ENCOUNTER — Telehealth: Payer: Self-pay | Admitting: *Deleted

## 2019-02-20 ENCOUNTER — Encounter: Payer: Self-pay | Admitting: Physician Assistant

## 2019-02-20 NOTE — Telephone Encounter (Signed)
Call placed to pt re: virtual visit scheduled 02/22/2019 with Ronie Spies, PA-C. Do not have consent documented. Left message for pt to call back.

## 2019-02-20 NOTE — Progress Notes (Signed)
Virtual Visit via Telephone Note   This visit type was conducted due to national recommendations for restrictions regarding the COVID-19 Pandemic (e.g. social distancing) in an effort to limit this patient's exposure and mitigate transmission in our community.  Due to her co-morbid illnesses, this patient is at least at moderate risk for complications without adequate follow up.  This format is felt to be most appropriate for this patient at this time.  The patient did not have access to video technology/had technical difficulties with video requiring transitioning to audio format only (telephone).  All issues noted in this document were discussed and addressed.  No physical exam could be performed with this format.  Please refer to the patient's chart for her  consent to telehealth for [facility].   Date:  02/22/2019   ID:  Susen Haskew, DOB [birth year], MRN [id number]  Patient Location: Home Provider Location: Home  PCP:  Kathyrn Lass, MD  Cardiologist:  Larae Grooms, MD  Electrophysiologist:  None   Evaluation Performed:  Follow-Up Visit  Chief Complaint:  Overdue 6 month f/u of SOB (last seen 2018)  History of Present Illness:    Brenisha Hepp is a 78 y.o. female with obesity, HTN, diabetes, stroke, aortic and coronary atherosclerosis by CT, diastolic dysfunction, hypertensive heart disease, mild mitral regurgitation, deconditioning/sedentary lifestyle who presents for routine follow-up.  Per review of remote records from outside cardiology office [facility]), she had a cardionet in 08/2014-2016 showing normal study except occasional PVCs, HR 83-133. Nuclear stress test 09/2014 showed no ischemia, possible breast attenuation, EF 86%, focus of radiotracer near left breast (she had followed with PCP for this per notes). 2d echo 2016 showed EF >28%, LVH, diastolic dysfunction, bubble negative, RVSP 35mHg, trace MR/TR. Carotid duplex 2015 unremarkable. She was seen by Dr.  VIrish Lackin 07/2017 for SOB. Echo in our system 06/2017 had shown EF 55-60%, grade 1 DD, mild MR, trivial AI, mild TR, PASP 246mg. He felt SOB was related to deconditioning and he advised increase activity with close f/u in a year. 10/2018 [facility] labs show albumin 3.9, ALT/AST wnl, BUN 24, Cr 0.930, Hgb 12.2, LDL 96, K 4.5, plt 222, Na 139, 2018 TSH wnl.  She is seen today virtually by phone. She is very soft spoken. [facility] [place] has not had any new cardiac symptoms. She denies significant SOB. She remains somewhat sedentary. She denies any CP, edema, palpitations, syncope, dizziness. She has not checked her BP personally but states when she saw PCP recently it was fine.The patient does not have symptoms concerning for COVID-19 infection (fever, chills, cough, or new shortness of breath).    Past Medical History:  Diagnosis Date  . Acute blood loss anemia   . Anemia   . Arthritis    "legs, knees" (01/07/2016)  . Arthritis of right hand 01/11/2017  . Coronary artery calcification seen on CT scan   . Diabetes mellitus ([place]  . Diastolic dysfunction   . Dysarthria, post-stroke   . Dysphagia, post-stroke   . Facial droop   . GERD (gastroesophageal reflux disease)    no meds  . High cholesterol   . History of blood transfusion   . Hyperlipidemia   . Hypertension   . Hypertensive heart disease   . Hypokalemia   . Left basal ganglia embolic stroke ([id number]. Osteoarthritis of right knee 01/06/2016  . Seasonal allergies   . Shortness of breath dyspnea  if too active  . Stroke ([place]) 07/16/2017  . Syncope    passed out and was hospitalized for a couple of days  . Type II diabetes mellitus ([place])    does not take medicine diet controlled   Past Surgical History:  Procedure Laterality Date  . BUNIONECTOMY Bilateral   . CATARACT EXTRACTION W/ INTRAOCULAR LENS  IMPLANT, BILATERAL Bilateral   . INGUINAL HERNIA REPAIR Right   . KNEE ARTHROSCOPY  WITH EXCISION BAKER'S CYST Right   . MR LOWER LEG LEFT ([place] HX) Left    after a break  . PARTIAL KNEE ARTHROPLASTY Right 01/06/2016   Procedure: RIGHT UNICOMPARTMENTAL KNEE ARTHROPLASTY;  Surgeon: Mcarthur Rossetti, MD;  Location: [facility];  Service: Orthopedics;  Laterality: Right;  . VEIN LIGATION AND STRIPPING Bilateral      Current Meds  Medication Sig  . atorvastatin (LIPITOR) 80 MG tablet Take 1 tablet (80 mg total) by mouth daily at 6 PM.  . blood glucose meter kit and supplies Dispense based on patient and insurance preference. Use up to four times daily as directed. (FOR ICD-9 250.00, 250.01).  . citalopram (CELEXA) 40 MG tablet Take 1 tablet (40 mg total) by mouth daily.  . clopidogrel (PLAVIX) 75 MG tablet Take 1 tablet (75 mg total) by mouth daily.  Marland Kitchen gabapentin (NEURONTIN) 600 MG tablet TAKE 1 TABLET BY MOUTH EVERY DAY  . hyoscyamine (LEVSIN, ANASPAZ) 0.125 MG tablet Take 1 tablet (0.125 mg total) by mouth every 6 (six) hours as needed.  Marland Kitchen losartan (COZAAR) 100 MG tablet Take 1 tablet (100 mg total) by mouth daily.  . metoprolol succinate (TOPROL-XL) 25 MG 24 hr tablet Take 1 tablet (25 mg total) by mouth daily. 1 time curtesy refill for patient, further refill of this type of medication will need to be addressed by patients primary care provider  . solifenacin (VESICARE) 10 MG tablet Take 10 mg by mouth daily as needed (frequent urination).      Allergies:   Oregano [origanum oil]; Peanut-containing drug products; and Tomato   Social History   Tobacco Use  . Smoking status: Never Smoker  . Smokeless tobacco: Never Used  Substance Use Topics  . Alcohol use: No  . Drug use: No     Family Hx: The patient's family history includes CAD in her father; Stroke in her mother.  ROS:   Please see the history of present illness.    All other systems reviewed and are negative.   Prior CV studies:   Most recent pertinent cardiac studies are outlined above.   Labs/Other  Tests and Data Reviewed:    EKG:  An ECG dated 07/16/17 was personally reviewed today and demonstrated:  NSR 71bpm probable LAE, + LAFB, nonspecific T wave changes  Recent Labs: No results found for requested labs within last 8760 hours.   Recent Lipid Panel Lab Results  Component Value Date/Time   CHOL 250 (H) 07/17/2017 06:58 AM   TRIG 85 07/17/2017 06:58 AM   HDL 58 07/17/2017 06:58 AM   CHOLHDL 4.3 07/17/2017 06:58 AM   LDLCALC 175 (H) 07/17/2017 06:58 AM    Wt Readings from Last 3 Encounters:  02/22/19 181 lb (82.1 kg)  01/30/18 202 lb (91.6 kg)  08/05/17 192 lb 9.6 oz (87.4 kg)     Objective:    Vital Signs:  Ht '5\' 7"'  (1.702 m)   Wt 181 lb (82.1 kg)   BMI 28.35 kg/m    VITAL SIGNS:  reviewed  General - elderly female in no acute distress Pulm - No labored breathing, no coughing during visit, no audible wheezing, speaking in full sentences Neuro - A+Ox3, no slurred speech, answers questions appropriately Psych - Pleasant affect     ASSESSMENT & PLAN:    1. Shortness of breath - stable. Previously suspected to be due to deconditioning. She will notify for any escalating symptoms. 2. Coronary atherosclerosis on CT - continue risk factor modification with BP control, statin, and surveillance for symptoms. She is on Plavix for hx of stroke. PCP follows lipids. 3. Essential HTN - pt declines checking BP at home, but states she has not had any recent issues with it running high when seen by PCP.  4. Hyperlipidemia - this is managed by primary care and she is on high dose statin therapy given history of stroke. 5. Mild mitral regurgitation - follow-up echo would be due around 06/2022 but since she is relatively asymptomatic at present time, would continue to follow clinically at this time.  COVID-19 Education: The signs and symptoms of COVID-19 were discussed with the patient and how to seek care for testing (follow up with PCP or arrange E-visit).  The importance of  social distancing was discussed today.  Time:   Today, I have spent 15 minutes with the patient with telehealth technology discussing the above problems.     Medication Adjustments/Labs and Tests Ordered: Current medicines are reviewed at length with the patient today.  Concerns regarding medicines are outlined above.    Disposition:  Follow up in 8 months (since it has been since 2018 since in-office OV) with Dr. Irish Lack  Signed, Charlie Pitter, PA-C  02/22/2019 2:38 PM    [place]

## 2019-02-22 ENCOUNTER — Other Ambulatory Visit: Payer: Self-pay

## 2019-02-22 ENCOUNTER — Telehealth (INDEPENDENT_AMBULATORY_CARE_PROVIDER_SITE_OTHER): Payer: Medicare HMO | Admitting: Physician Assistant

## 2019-02-22 ENCOUNTER — Encounter: Payer: Self-pay | Admitting: Physician Assistant

## 2019-02-22 VITALS — Ht 67.0 in | Wt 181.0 lb

## 2019-02-22 DIAGNOSIS — R0602 Shortness of breath: Secondary | ICD-10-CM | POA: Diagnosis not present

## 2019-02-22 DIAGNOSIS — I340 Nonrheumatic mitral (valve) insufficiency: Secondary | ICD-10-CM

## 2019-02-22 DIAGNOSIS — E785 Hyperlipidemia, unspecified: Secondary | ICD-10-CM

## 2019-02-22 DIAGNOSIS — I10 Essential (primary) hypertension: Secondary | ICD-10-CM

## 2019-02-22 DIAGNOSIS — I2510 Atherosclerotic heart disease of native coronary artery without angina pectoris: Secondary | ICD-10-CM

## 2019-02-22 NOTE — Patient Instructions (Signed)
Medication Instructions:  Your physician recommends that you continue on your current medications as directed. Please refer to the Current Medication list given to you today.  If you need a refill on your cardiac medications before your next appointment, please call your pharmacy.   Lab work: None ordered  If you have labs (blood work) drawn today and your tests are completely normal, you will receive your results only by: Marland Kitchen MyChart Message (if you have MyChart) OR . A paper copy in the mail If you have any lab test that is abnormal or we need to change your treatment, we will call you to review the results.  Testing/Procedures: None ordered  Follow-Up: At [facility], you and your health needs are our priority.  As part of our continuing mission to provide you with exceptional heart care, we have created designated Provider Care Teams.  These Care Teams include your primary Cardiologist (physician) and Advanced Practice Providers (APPs -  Physician Assistants and Nurse Practitioners) who all work together to provide you with the care you need, when you need it. You will need a follow up appointment in 8 months. (IN OFFICE)   Please call our office 2 months in advance to schedule this appointment.  You may see Lance Muss, MD or one of the following Advanced Practice Providers on your designated Care Team:   [place], PA-C Ronie Spies, PA-C . Jacolyn Reedy, PA-C  Any Other Special Instructions Will Be Listed Below (If Applicable).

## 2019-04-09 ENCOUNTER — Other Ambulatory Visit (INDEPENDENT_AMBULATORY_CARE_PROVIDER_SITE_OTHER): Payer: Self-pay | Admitting: Physician Assistant

## 2019-04-09 NOTE — Telephone Encounter (Signed)
Please advise 

## 2019-05-16 ENCOUNTER — Encounter: Payer: Self-pay | Admitting: Orthopaedic Surgery

## 2019-05-16 ENCOUNTER — Ambulatory Visit (INDEPENDENT_AMBULATORY_CARE_PROVIDER_SITE_OTHER): Payer: Medicare HMO | Admitting: Orthopaedic Surgery

## 2019-05-16 DIAGNOSIS — M25561 Pain in right knee: Secondary | ICD-10-CM | POA: Diagnosis not present

## 2019-05-16 DIAGNOSIS — M25562 Pain in left knee: Secondary | ICD-10-CM

## 2019-05-16 MED ORDER — LIDOCAINE HCL 1 % IJ SOLN
3.0000 mL | INTRAMUSCULAR | Status: AC | PRN
  Administered 2019-05-16: 3 mL

## 2019-05-16 MED ORDER — METHYLPREDNISOLONE ACETATE 40 MG/ML IJ SUSP
40.0000 mg | INTRAMUSCULAR | Status: AC | PRN
  Administered 2019-05-16: 40 mg via INTRA_ARTICULAR

## 2019-05-16 MED ORDER — TRAMADOL HCL 50 MG PO TABS: 50.0000 mg | ORAL_TABLET | Freq: Four times a day (QID) | ORAL | 0 refills | Status: DC | PRN

## 2019-05-16 NOTE — Progress Notes (Signed)
Office Visit Note   Patient: Kristi David           Date of Birth: 03/15/1941           MRN: [id number] Visit Date: 05/16/2019              Requested by: Sigmund HazelMiller, Lisa, MD [address] [place],  [place] [id number] PCP: Sigmund HazelMiller, Lisa, MD   Assessment & Plan: Visit Diagnoses:  1. Left knee pain, unspecified chronicity   2. Right knee pain, unspecified chronicity     Plan: I did place a steroid injection in both knees today per her wishes.  I counseled her about the risk and benefits of these types of injections.  Follow-up will be as needed.  She knows to wait at least 4 months for an injection in her left knee and wait up to a year from injection of the right knee.  Follow-Up Instructions: Return if symptoms worsen or fail to improve.   Orders:  Orders Placed This Encounter  Procedures  . Large Joint Inj  . Large Joint Inj   Meds ordered this encounter  Medications  . traMADol (ULTRAM) 50 MG tablet    Sig: Take 1-2 tablets (50-100 mg total) by mouth every 6 (six) hours as needed.    Dispense:  40 tablet    Refill:  0      Procedures: Large Joint Inj: R knee on 05/16/2019 1:01 PM Indications: diagnostic evaluation and pain Details: 22 G 1.5 in needle, superolateral approach  Arthrogram: No  Medications: 3 mL lidocaine 1 %; 40 mg methylPREDNISolone acetate 40 MG/ML Outcome: tolerated well, no immediate complications Procedure, treatment alternatives, risks and benefits explained, specific risks discussed. Consent was given by the patient. Immediately prior to procedure a time out was called to verify the correct patient, procedure, equipment, support staff and site/side marked as required. Patient was prepped and draped in the usual sterile fashion.   Large Joint Inj: L knee on 05/16/2019 1:02 PM Indications: diagnostic evaluation and pain Details: 22 G 1.5 in needle, superolateral approach  Arthrogram: No  Medications: 3 mL lidocaine 1 %; 40 mg methylPREDNISolone  acetate 40 MG/ML Outcome: tolerated well, no immediate complications Procedure, treatment alternatives, risks and benefits explained, specific risks discussed. Consent was given by the patient. Immediately prior to procedure a time out was called to verify the correct patient, procedure, equipment, support staff and site/side marked as required. Patient was prepped and draped in the usual sterile fashion.       Clinical Data: No additional findings.   Subjective: Chief Complaint  Patient presents with  . Left Knee - Pain  . Right Knee - Pain  The patient comes in today well-known to us.  She has known end-stage arthritis of her left knee and has a history of the right partial knee arthroplasty.  Both knees hurt and she would like to have a steroid injection in both knees today.  We actually did this 11 months ago.  She knows to wait for a long time between injections and especially her right knee.  She is had no other acute change in her medical status.  She does ambulate slowly.  She is requesting some tramadol for pain.  She does use a cane when she walks and has limited activities.  HPI  Review of Systems She currently denies any headache, chest pain, shortness of breath, fever, chills, nausea, vomiting  Objective: Vital Signs: There were no vitals taken for this visit.  Physical Exam She is alert and orient x3 and in no acute distress Ortho Exam Examination of both knees show slight swelling of both knees.  She has edema in both lower extremities below the knees.  She has limited range of motion of both knees mainly secondary to just being slow but no blocks to motion. Specialty Comments:  No specialty comments available.  Imaging: No results found.   PMFS History: Patient Active Problem List   Diagnosis Date Noted  . Adhesive capsulitis of right shoulder 11/28/2017  . Left basal ganglia embolic stroke ([facility]) 63/87/5643  . Facial droop   . Right sided weakness   .  Diabetes mellitus ([facility])   . Diastolic dysfunction   . Dysphagia, post-stroke   . Dysarthria, post-stroke   . Hypokalemia   . Acute blood loss anemia   . Hyperlipidemia   . Stroke ([facility]) 07/16/2017  . Hypertension 07/16/2017  . Arthritis of right hand 01/11/2017  . Osteoarthritis of right knee 01/06/2016  . Status post right partial knee replacement 01/06/2016   Past Medical History:  Diagnosis Date  . Acute blood loss anemia   . Anemia   . Arthritis    "legs, knees" (01/07/2016)  . Arthritis of right hand 01/11/2017  . Coronary artery calcification seen on CT scan   . Diabetes mellitus ([facility])   . Diastolic dysfunction   . Dysarthria, post-stroke   . Dysphagia, post-stroke   . Facial droop   . GERD (gastroesophageal reflux disease)    no meds  . High cholesterol   . History of blood transfusion   . Hyperlipidemia   . Hypertension   . Hypertensive heart disease   . Hypokalemia   . Left basal ganglia embolic stroke ([facility]) 32/95/1884  . Osteoarthritis of right knee 01/06/2016  . Seasonal allergies   . Shortness of breath dyspnea    if too active  . Stroke ([facility]) 07/16/2017  . Syncope    passed out and was hospitalized for a couple of days  . Type II diabetes mellitus ([facility])    does not take medicine diet controlled    Family History  Problem Relation Age of Onset  . Stroke Mother   . CAD Father     Past Surgical History:  Procedure Laterality Date  . BUNIONECTOMY Bilateral   . CATARACT EXTRACTION W/ INTRAOCULAR LENS  IMPLANT, BILATERAL Bilateral   . INGUINAL HERNIA REPAIR Right   . KNEE ARTHROSCOPY WITH EXCISION BAKER'S CYST Right   . MR LOWER LEG LEFT ([facility] HX) Left    after a break  . PARTIAL KNEE ARTHROPLASTY Right 01/06/2016   Procedure: RIGHT UNICOMPARTMENTAL KNEE ARTHROPLASTY;  Surgeon: Mcarthur Rossetti, MD;  Location: [facility];  Service: Orthopedics;  Laterality: Right;  . VEIN LIGATION AND STRIPPING Bilateral    Social History   Occupational History  .  Not on file  Tobacco Use  . Smoking status: Never Smoker  . Smokeless tobacco: Never Used  Substance and Sexual Activity  . Alcohol use: No  . Drug use: No  . Sexual activity: Not on file

## 2019-05-30 DIAGNOSIS — Z23 Encounter for immunization: Secondary | ICD-10-CM | POA: Diagnosis not present

## 2019-05-30 DIAGNOSIS — G629 Polyneuropathy, unspecified: Secondary | ICD-10-CM | POA: Diagnosis not present

## 2019-05-30 DIAGNOSIS — R109 Unspecified abdominal pain: Secondary | ICD-10-CM | POA: Diagnosis not present

## 2019-05-30 DIAGNOSIS — N3941 Urge incontinence: Secondary | ICD-10-CM | POA: Diagnosis not present

## 2019-05-30 DIAGNOSIS — Z6829 Body mass index (BMI) 29.0-29.9, adult: Secondary | ICD-10-CM | POA: Diagnosis not present

## 2019-05-30 DIAGNOSIS — M1990 Unspecified osteoarthritis, unspecified site: Secondary | ICD-10-CM | POA: Diagnosis not present

## 2019-05-30 DIAGNOSIS — I69322 Dysarthria following cerebral infarction: Secondary | ICD-10-CM | POA: Diagnosis not present

## 2019-05-30 DIAGNOSIS — E7800 Pure hypercholesterolemia, unspecified: Secondary | ICD-10-CM | POA: Diagnosis not present

## 2019-05-30 DIAGNOSIS — N3281 Overactive bladder: Secondary | ICD-10-CM | POA: Diagnosis not present

## 2019-05-30 DIAGNOSIS — I10 Essential (primary) hypertension: Secondary | ICD-10-CM | POA: Diagnosis not present

## 2019-06-27 DIAGNOSIS — R634 Abnormal weight loss: Secondary | ICD-10-CM | POA: Diagnosis not present

## 2019-06-27 DIAGNOSIS — R1084 Generalized abdominal pain: Secondary | ICD-10-CM | POA: Diagnosis not present

## 2019-06-27 DIAGNOSIS — K5900 Constipation, unspecified: Secondary | ICD-10-CM | POA: Diagnosis not present

## 2019-07-03 ENCOUNTER — Other Ambulatory Visit: Payer: Self-pay | Admitting: Gastroenterology

## 2019-07-03 DIAGNOSIS — R634 Abnormal weight loss: Secondary | ICD-10-CM

## 2019-07-08 ENCOUNTER — Other Ambulatory Visit (INDEPENDENT_AMBULATORY_CARE_PROVIDER_SITE_OTHER): Payer: Self-pay | Admitting: Orthopaedic Surgery

## 2019-07-09 ENCOUNTER — Ambulatory Visit
Admission: RE | Admit: 2019-07-09 | Discharge: 2019-07-09 | Disposition: A | Discharge status: Home / Self Care | Payer: Medicare HMO | Source: Ambulatory Visit | Attending: Gastroenterology | Admitting: Gastroenterology

## 2019-07-09 DIAGNOSIS — K5730 Diverticulosis of large intestine without perforation or abscess without bleeding: Secondary | ICD-10-CM | POA: Diagnosis not present

## 2019-07-09 DIAGNOSIS — R634 Abnormal weight loss: Secondary | ICD-10-CM

## 2019-07-09 MED ORDER — IOPAMIDOL (ISOVUE-300) INJECTION 61%
100.0000 mL | Freq: Once | INTRAVENOUS | Status: AC | PRN
  Administered 2019-07-09: 100 mL via INTRAVENOUS

## 2019-07-09 NOTE — Telephone Encounter (Signed)
Please advise 

## 2019-09-06 ENCOUNTER — Telehealth: Payer: Self-pay | Admitting: Orthopaedic Surgery

## 2019-09-06 NOTE — Telephone Encounter (Signed)
Patients daughter did pick up card

## 2019-09-06 NOTE — Telephone Encounter (Signed)
Patient's daughter called stating that the patient is flying out in the morning due to a family emergency and is needing the medical card stating that the patient has metal in her body.  The daughter would like to come pick that up today before we close.  CB#[phone] or the daughter Freddi Starr) [phone].  Thank you.

## 2019-10-06 ENCOUNTER — Other Ambulatory Visit (INDEPENDENT_AMBULATORY_CARE_PROVIDER_SITE_OTHER): Payer: Self-pay | Admitting: Orthopaedic Surgery

## 2019-10-08 NOTE — Telephone Encounter (Signed)
Please advise 

## 2019-12-31 ENCOUNTER — Other Ambulatory Visit: Payer: Self-pay | Admitting: Family Medicine

## 2019-12-31 DIAGNOSIS — Z1231 Encounter for screening mammogram for malignant neoplasm of breast: Secondary | ICD-10-CM

## 2020-01-04 ENCOUNTER — Other Ambulatory Visit (INDEPENDENT_AMBULATORY_CARE_PROVIDER_SITE_OTHER): Payer: Self-pay | Admitting: Orthopaedic Surgery

## 2020-01-04 NOTE — Telephone Encounter (Signed)
Pls advise. Thanks.  

## 2020-01-15 ENCOUNTER — Other Ambulatory Visit: Payer: Self-pay

## 2020-01-15 ENCOUNTER — Ambulatory Visit
Admission: RE | Admit: 2020-01-15 | Discharge: 2020-01-15 | Disposition: A | Discharge status: Home / Self Care | Payer: Medicare HMO | Source: Ambulatory Visit | Attending: Family Medicine | Admitting: Family Medicine

## 2020-01-15 DIAGNOSIS — Z1231 Encounter for screening mammogram for malignant neoplasm of breast: Secondary | ICD-10-CM

## 2020-01-30 ENCOUNTER — Ambulatory Visit: Payer: Medicare HMO | Admitting: Orthopaedic Surgery

## 2020-02-06 ENCOUNTER — Ambulatory Visit (INDEPENDENT_AMBULATORY_CARE_PROVIDER_SITE_OTHER): Payer: Medicare HMO | Admitting: Orthopaedic Surgery

## 2020-02-06 ENCOUNTER — Other Ambulatory Visit: Payer: Self-pay

## 2020-02-06 ENCOUNTER — Encounter: Payer: Self-pay | Admitting: Orthopaedic Surgery

## 2020-02-06 DIAGNOSIS — G8929 Other chronic pain: Secondary | ICD-10-CM | POA: Diagnosis not present

## 2020-02-06 DIAGNOSIS — M25561 Pain in right knee: Secondary | ICD-10-CM

## 2020-02-06 DIAGNOSIS — M25562 Pain in left knee: Secondary | ICD-10-CM

## 2020-02-06 DIAGNOSIS — M1712 Unilateral primary osteoarthritis, left knee: Secondary | ICD-10-CM

## 2020-02-06 DIAGNOSIS — M1711 Unilateral primary osteoarthritis, right knee: Secondary | ICD-10-CM | POA: Insufficient documentation

## 2020-02-06 MED ORDER — METHYLPREDNISOLONE ACETATE 40 MG/ML IJ SUSP
40.0000 mg | INTRAMUSCULAR | Status: AC | PRN
  Administered 2020-02-06: 40 mg via INTRA_ARTICULAR

## 2020-02-06 MED ORDER — TRAMADOL HCL 50 MG PO TABS: 50.0000 mg | ORAL_TABLET | Freq: Four times a day (QID) | ORAL | 0 refills | Status: DC | PRN

## 2020-02-06 MED ORDER — LIDOCAINE HCL 1 % IJ SOLN
3.0000 mL | INTRAMUSCULAR | Status: AC | PRN
  Administered 2020-02-06: 3 mL

## 2020-02-06 NOTE — Progress Notes (Signed)
Office Visit Note   Patient: Kristi David           Date of Birth: 1941-02-03           MRN: [id number] Visit Date: 02/06/2020              Requested by: Sigmund Hazel, MD [address] [place],  [place] [address] PCP: Sigmund Hazel, MD   Assessment & Plan: Visit Diagnoses:  1. Chronic pain of both knees   2. Unilateral primary osteoarthritis, left knee     Plan: I was able to place a steroid injection easily into her left knee today per her request.  She did tolerate it well.  All questions and concerns were answered and addressed.  She understands her only other treatment option would be a knee replacement.  Follow-up can be as needed.  Follow-Up Instructions: Return if symptoms worsen or fail to improve.   Orders:  Orders Placed This Encounter  Procedures  . Large Joint Inj   No orders of the defined types were placed in this encounter.     Procedures: Large Joint Inj: L knee on 02/06/2020 1:13 PM Indications: diagnostic evaluation and pain Details: 22 G 1.5 in needle, superolateral approach  Arthrogram: No  Medications: 3 mL lidocaine 1 %; 40 mg methylPREDNISolone acetate 40 MG/ML Outcome: tolerated well, no immediate complications Procedure, treatment alternatives, risks and benefits explained, specific risks discussed. Consent was given by the patient. Immediately prior to procedure a time out was called to verify the correct patient, procedure, equipment, support staff and site/side marked as required. Patient was prepped and draped in the usual sterile fashion.       Clinical Data: No additional findings.   Subjective: Chief Complaint  Patient presents with  . Left Knee - Pain  . Right Knee - Pain  The patient comes in today well-known to me.  She has known arthritis of her left knee and has had a partial knee replacement on her right knee.  We have injected steroid in her left knee before which is the last time about 9 months ago.  She ambulates with a  cane.  She says her knee is hurting.  She states the injections do not last long but she is willing to still consider injections because she is not interested in the knee replacement surgery.  It is the left knee that is hurting worse.  The right knee does hurt some but not like left knee.  She has had no other acute change in her medical status.  She is 79 years old.  She does ambulate using a cane.  HPI  Review of Systems She currently denies any headache, chest pain, shortness of breath, fever, chills, nausea, vomiting  Objective: Vital Signs: There were no vitals taken for this visit.  Physical Exam She is alert and orient x3 and in no acute distress Ortho Exam Examination of her left knee shows just a mild effusion.  There is medial lateral joint line tenderness and patellofemoral rotation but good range of motion.  There is pain on her arc of motion.  The knee is ligamentously stable. Specialty Comments:  No specialty comments available.  Imaging: No results found.   PMFS History: Patient Active Problem List   Diagnosis Date Noted  . Unilateral primary osteoarthritis, right knee 02/06/2020  . Unilateral primary osteoarthritis, left knee 02/06/2020  . Adhesive capsulitis of right shoulder 11/28/2017  . Left basal ganglia embolic stroke ([facility]) 07/18/2017  .  Facial droop   . Right sided weakness   . Diabetes mellitus ([facility])   . Diastolic dysfunction   . Dysphagia, post-stroke   . Dysarthria, post-stroke   . Hypokalemia   . Acute blood loss anemia   . Hyperlipidemia   . Stroke ([facility]) 07/16/2017  . Hypertension 07/16/2017  . Arthritis of right hand 01/11/2017  . Osteoarthritis of right knee 01/06/2016  . Status post right partial knee replacement 01/06/2016   Past Medical History:  Diagnosis Date  . Acute blood loss anemia   . Anemia   . Arthritis    "legs, knees" (01/07/2016)  . Arthritis of right hand 01/11/2017  . Coronary artery calcification seen on CT scan   .  Diabetes mellitus ([facility])   . Diastolic dysfunction   . Dysarthria, post-stroke   . Dysphagia, post-stroke   . Facial droop   . GERD (gastroesophageal reflux disease)    no meds  . High cholesterol   . History of blood transfusion   . Hyperlipidemia   . Hypertension   . Hypertensive heart disease   . Hypokalemia   . Left basal ganglia embolic stroke ([facility]) 16/06/9603  . Osteoarthritis of right knee 01/06/2016  . Seasonal allergies   . Shortness of breath dyspnea    if too active  . Stroke ([facility]) 07/16/2017  . Syncope    passed out and was hospitalized for a couple of days  . Type II diabetes mellitus ([facility])    does not take medicine diet controlled    Family History  Problem Relation Age of Onset  . Stroke Mother   . CAD Father     Past Surgical History:  Procedure Laterality Date  . BUNIONECTOMY Bilateral   . CATARACT EXTRACTION W/ INTRAOCULAR LENS  IMPLANT, BILATERAL Bilateral   . INGUINAL HERNIA REPAIR Right   . KNEE ARTHROSCOPY WITH EXCISION BAKER'S CYST Right   . MR LOWER LEG LEFT ([facility] HX) Left    after a break  . PARTIAL KNEE ARTHROPLASTY Right 01/06/2016   Procedure: RIGHT UNICOMPARTMENTAL KNEE ARTHROPLASTY;  Surgeon: Mcarthur Rossetti, MD;  Location: [facility];  Service: Orthopedics;  Laterality: Right;  . VEIN LIGATION AND STRIPPING Bilateral    Social History   Occupational History  . Not on file  Tobacco Use  . Smoking status: Never Smoker  . Smokeless tobacco: Never Used  Substance and Sexual Activity  . Alcohol use: No  . Drug use: No  . Sexual activity: Not on file

## 2020-04-16 ENCOUNTER — Other Ambulatory Visit: Payer: Self-pay | Admitting: Orthopaedic Surgery

## 2020-04-17 NOTE — Telephone Encounter (Signed)
Can you advise while CB out? °

## 2020-07-02 ENCOUNTER — Other Ambulatory Visit (INDEPENDENT_AMBULATORY_CARE_PROVIDER_SITE_OTHER): Payer: Self-pay | Admitting: Family

## 2020-07-02 NOTE — Telephone Encounter (Signed)
Please advise 

## 2020-08-05 ENCOUNTER — Other Ambulatory Visit: Payer: Self-pay | Admitting: Orthopedic Surgery

## 2020-08-05 NOTE — Telephone Encounter (Signed)
Please advise 

## 2020-10-14 ENCOUNTER — Ambulatory Visit: Payer: Medicare HMO | Admitting: Orthopaedic Surgery

## 2021-03-13 ENCOUNTER — Other Ambulatory Visit: Payer: Self-pay | Admitting: Orthopaedic Surgery

## 2021-04-19 NOTE — Progress Notes (Deleted)
  Cardiology Office Note   Date:  04/19/2021   ID:  Kristi David, DOB 04/12/1941, MRN [id number]  PCP:  Miller, Lisa, MD    No chief complaint on file.    Wt Readings from Last 3 Encounters:  02/22/19 181 lb (82.1 kg)  01/30/18 202 lb (91.6 kg)  08/05/17 192 lb 9.6 oz (87.4 kg)       History of Present Illness: Kristi David is a 80 y.o. female   with a history of obesity, hypertension, hyperlipidemia,, diabetes and stroke.   I saw her initially in 2018 when She has had dyspnea on exertion.  She had a CT scan showing no evidence of pulmonary embolism.  There was aortic and coronary atherosclerosis noted by CT.  She is here for further evaluation.   Echocardiogram was done in 2018:   - Upper normal LV wall thickness with LVEF 55-60% and grade 1   diastolic dysfunction. Mildly calcified mitral annulus with mild   mitral regurgitation. Trivial aortic regurgitation. Mild   tricuspid regurgitation with PASP 28 mmHg. No obvious PFO or ASD.   There is a report of a normal stress test in 2016.  Those results are unavailable to me.  Tis was done in [place] , [place].   In 2018, "THe most exercise she does is stretching.  SHe does not get her HR elevated much.  With walking, she feels some DOE."  Plan in 2018 was : "DOE:  Likely multifactorial.  Patient with prior stroke and limited activity.  I am sure there is a component of deconditioning.  Echocardiogram showed normal LV function. ... she is not having chest pain per se.  Would hold off on any ischemic workup at this time.  She may benefit more from a stationary bike, and trying to increase cardio exercise gradually.  She does not show evidence of volume overload.  Chest x-ray reviewed and showed no evidence of pulmonary edema. Discussed with daughter."   Past Medical History:  Diagnosis Date   Acute blood loss anemia    Anemia    Arthritis    "legs, knees" (01/07/2016)   Arthritis of right hand 01/11/2017   Coronary artery  calcification seen on CT scan    Diabetes mellitus ([facility])    Diastolic dysfunction    Dysarthria, post-stroke    Dysphagia, post-stroke    Facial droop    GERD (gastroesophageal reflux disease)    no meds   High cholesterol    History of blood transfusion    Hyperlipidemia    Hypertension    Hypertensive heart disease    Hypokalemia    Left basal ganglia embolic stroke ([facility]) 07/18/2017   Osteoarthritis of right knee 01/06/2016   Seasonal allergies    Shortness of breath dyspnea    if too active   Stroke ([facility]) 07/16/2017   Syncope    passed out and was hospitalized for a couple of days   Type II diabetes mellitus ([facility])    does not take medicine diet controlled    Past Surgical History:  Procedure Laterality Date   BUNIONECTOMY Bilateral    CATARACT EXTRACTION W/ INTRAOCULAR LENS  IMPLANT, BILATERAL Bilateral    INGUINAL HERNIA REPAIR Right    KNEE ARTHROSCOPY WITH EXCISION BAKER'S CYST Right    MR LOWER LEG LEFT ([facility] HX) Left    after a break   PARTIAL KNEE ARTHROPLASTY Right 01/06/2016   Procedure: RIGHT UNICOMPARTMENTAL KNEE ARTHROPLASTY;  Surgeon: Christopher Y Blackman, MD;  [facility];  Service: Orthopedics;  Laterality: Right;   VEIN LIGATION AND STRIPPING Bilateral      Current Outpatient Medications  Medication Sig Dispense Refill   atorvastatin (LIPITOR) 80 MG tablet Take 1 tablet (80 mg total) by mouth daily at 6 PM. 30 tablet 0   blood glucose meter kit and supplies Dispense based on patient and insurance preference. Use up to four times daily as directed. (FOR ICD-9 250.00, 250.01). 1 each 0   citalopram (CELEXA) 40 MG tablet Take 1 tablet (40 mg total) by mouth daily. 30 tablet 0   clopidogrel (PLAVIX) 75 MG tablet Take 1 tablet (75 mg total) by mouth daily. 30 tablet 0   gabapentin (NEURONTIN) 600 MG tablet TAKE 1 TABLET BY MOUTH EVERY DAY 90 tablet 0   hyoscyamine (LEVSIN, ANASPAZ) 0.125 MG tablet Take 1 tablet (0.125 mg total) by mouth every 6 (six)  hours as needed. 45 tablet 2   losartan (COZAAR) 100 MG tablet Take 1 tablet (100 mg total) by mouth daily. 30 tablet 0   metoprolol succinate (TOPROL-XL) 25 MG 24 hr tablet Take 1 tablet (25 mg total) by mouth daily. 1 time curtesy refill for patient, further refill of this type of medication will need to be addressed by patients primary care provider 30 tablet 0   solifenacin (VESICARE) 10 MG tablet Take 10 mg by mouth daily as needed (frequent urination).      traMADol (ULTRAM) 50 MG tablet TAKE 1 TABLET BY MOUTH EVERY 6 HOURS AS NEEDED 40 tablet 0   No current facility-administered medications for this visit.    Allergies:   Oregano [origanum oil], Peanut-containing drug products, and Tomato    Social History:  The patient  reports that she has never smoked. She has never used smokeless tobacco. She reports that she does not drink alcohol and does not use drugs.   Family History:  The patient's ***family history includes CAD in her father; Stroke in her mother.    ROS:  Please see the history of present illness.   Otherwise, review of systems are positive for ***.   All other systems are reviewed and negative.    PHYSICAL EXAM: VS:  There were no vitals taken for this visit. , BMI There is no height or weight on file to calculate BMI. GEN: Well nourished, well developed, in no acute distress HEENT: normal Neck: no JVD, carotid bruits, or masses Cardiac: ***RRR; no murmurs, rubs, or gallops,no edema  Respiratory:  clear to auscultation bilaterally, normal work of breathing GI: soft, nontender, nondistended, + BS MS: no deformity or atrophy Skin: warm and dry, no rash Neuro:  Strength and sensation are intact Psych: euthymic mood, full affect   EKG:   The ekg ordered today demonstrates ***   Recent Labs: No results found for requested labs within last 8760 hours.   Lipid Panel    Component Value Date/Time   CHOL 250 (H) 07/17/2017 0658   TRIG 85 07/17/2017 0658   HDL  58 07/17/2017 0658   CHOLHDL 4.3 07/17/2017 0658   VLDL 17 07/17/2017 0658   LDLCALC 175 (H) 07/17/2017 0658     Other studies Reviewed: Additional studies/ records that were reviewed today with results demonstrating: ***.   ASSESSMENT AND PLAN:  Coronary artery calcification: DOE since 2018. HTN: Hyperlipidemia:   Current medicines are reviewed at length with the patient today.  The patient concerns regarding her medicines were addressed.  The following changes have been made:    No change***  Labs/ tests ordered today include: *** No orders of the defined types were placed in this encounter.   Recommend 150 minutes/week of aerobic exercise Low fat, low carb, high fiber diet recommended  Disposition:   FU in ***   Signed, Jayadeep Varanasi, MD  04/19/2021 5:00 PM    [facility] [address], [place], [place]  [address] Phone: [phone]; Fax: [phone]    

## 2021-04-20 ENCOUNTER — Ambulatory Visit: Payer: PRIVATE HEALTH INSURANCE | Admitting: Interventional Cardiology

## 2021-04-20 DIAGNOSIS — I10 Essential (primary) hypertension: Secondary | ICD-10-CM

## 2021-04-20 DIAGNOSIS — I2510 Atherosclerotic heart disease of native coronary artery without angina pectoris: Secondary | ICD-10-CM

## 2021-04-20 DIAGNOSIS — E1159 Type 2 diabetes mellitus with other circulatory complications: Secondary | ICD-10-CM

## 2021-04-20 DIAGNOSIS — E782 Mixed hyperlipidemia: Secondary | ICD-10-CM

## 2021-05-12 ENCOUNTER — Ambulatory Visit (INDEPENDENT_AMBULATORY_CARE_PROVIDER_SITE_OTHER): Payer: Medicare HMO

## 2021-05-12 ENCOUNTER — Encounter: Payer: Self-pay | Admitting: Orthopaedic Surgery

## 2021-05-12 ENCOUNTER — Ambulatory Visit (INDEPENDENT_AMBULATORY_CARE_PROVIDER_SITE_OTHER): Payer: Medicare HMO | Admitting: Orthopaedic Surgery

## 2021-05-12 ENCOUNTER — Ambulatory Visit: Payer: Self-pay

## 2021-05-12 ENCOUNTER — Other Ambulatory Visit: Payer: Self-pay

## 2021-05-12 DIAGNOSIS — M1712 Unilateral primary osteoarthritis, left knee: Secondary | ICD-10-CM

## 2021-05-12 DIAGNOSIS — Z96651 Presence of right artificial knee joint: Secondary | ICD-10-CM | POA: Diagnosis not present

## 2021-05-12 MED ORDER — GABAPENTIN 100 MG PO CAPS
100.0000 mg | ORAL_CAPSULE | Freq: Every day | ORAL | 0 refills | Status: DC
Start: 2021-05-12 — End: 2021-06-22

## 2021-05-12 MED ORDER — TRAMADOL HCL 50 MG PO TABS: 50.0000 mg | ORAL_TABLET | Freq: Four times a day (QID) | ORAL | 0 refills | Status: DC | PRN

## 2021-05-12 MED ORDER — METHYLPREDNISOLONE ACETATE 40 MG/ML IJ SUSP
40.0000 mg | INTRAMUSCULAR | Status: AC | PRN
  Administered 2021-05-12: 40 mg via INTRA_ARTICULAR

## 2021-05-12 MED ORDER — LIDOCAINE HCL 1 % IJ SOLN
3.0000 mL | INTRAMUSCULAR | Status: AC | PRN
  Administered 2021-05-12: 3 mL

## 2021-05-12 NOTE — Progress Notes (Signed)
Office Visit Note   Patient: Kristi David           Date of Birth: 07/27/1941           MRN: [id number] Visit Date: 05/12/2021              Requested by: Sigmund Hazel, MD [address] [place],  [place] [address] PCP: Sigmund Hazel, MD   Assessment & Plan: Visit Diagnoses:  1. Status post right partial knee replacement   2. Unilateral primary osteoarthritis, left knee     Plan: Patient not interested in any type of knee replacement this point in time.  She would like cortisone injection left knee.  She is also asking for pain medication.  We will place her on tramadol.  Start her back on Neurontin 100 mg nightly and slowly titrate up to therapeutic dose.  Questions encouraged and answered at length.  We will see her back as needed.  She understands that she could have cortisone injections in the left knee no more often than every 3 months.  Questions were encouraged and answered by Dr. Magnus Ivan myself.  Follow-Up Instructions: No follow-ups on file.   Orders:  Orders Placed This Encounter  Procedures   Large Joint Inj   XR Knee 1-2 Views Left   XR Knee 1-2 Views Right   Meds ordered this encounter  Medications   gabapentin (NEURONTIN) 100 MG capsule    Sig: Take 1 capsule (100 mg total) by mouth at bedtime.    Dispense:  30 capsule    Refill:  0   traMADol (ULTRAM) 50 MG tablet    Sig: Take 1 tablet (50 mg total) by mouth every 6 (six) hours as needed.    Dispense:  30 tablet    Refill:  0      Procedures: Large Joint Inj: L knee on 05/12/2021 9:05 AM Indications: pain Details: 25 G 1.5 in needle, anterolateral approach  Arthrogram: No  Medications: 3 mL lidocaine 1 %; 40 mg methylPREDNISolone acetate 40 MG/ML Outcome: tolerated well, no immediate complications Procedure, treatment alternatives, risks and benefits explained, specific risks discussed. Consent was given by the patient. Immediately prior to procedure a time out was called to verify the correct  patient, procedure, equipment, support staff and site/side marked as required. Patient was prepped and draped in the usual sterile fashion.      Clinical Data: No additional findings.   Subjective: Chief Complaint  Patient presents with   Right Knee - Pain   Left Knee - Pain    HPI Kristi David comes in today with bilateral knee pain.  She was last seen 02/06/2020.  At that time she was given an injection which is done well until recently.  She is status post right unicondylar knee arthroplasty 8 01/06/2016.  She is having swelling pain in both knees no new injuries.  She cannot take anything for pain.  She is asking for her gabapentin to be resumed which she has taken in the past for neuropathy.  She states she did stop the medication and would like to get back on this.  Review of Systems  Constitutional:  Negative for chills and fever.    Objective: Vital Signs: There were no vitals taken for this visit.  Physical Exam Constitutional:      Appearance: She is not ill-appearing or diaphoretic.  Neurological:     Mental Status: She is alert and oriented to person, place, and time.    Ortho  Exam Bilateral knees no abnormal warmth erythema or effusion.  Significant patellofemoral crepitus bilateral knees.  Well-healed surgical incision right knee.  No significant instability valgus varus stress in the left knee.  Tenderness along medial lateral joint lateral left knee.  Right knee slight laxity with valgus stressing.  Otherwise knee is stable.  Calves are supple nontender bilaterally. Specialty Comments:  No specialty comments available.  Imaging: XR Knee 1-2 Views Left  Result Date: 05/12/2021 Left knee 2 views: Significant patellofemoral arthritic changes.  Tricompartmental changes with narrowing medial lateral joint line moderate.  No acute fractures.  Knee is well located.  No significant change from prior x-rays.  XR Knee 1-2 Views Right  Result Date: 05/12/2021 Right  knee: No acute fracture.  Knee is well located.  Status post partial knee replacement arthroplasty components medial joint line are without any complicating features.  Significant patellofemoral changes.  No acute fractures.    PMFS History: Patient Active Problem List   Diagnosis Date Noted   Unilateral primary osteoarthritis, right knee 02/06/2020   Unilateral primary osteoarthritis, left knee 02/06/2020   Adhesive capsulitis of right shoulder 11/28/2017   Left basal ganglia embolic stroke (HCC) 07/18/2017   Facial droop    Right sided weakness    Diabetes mellitus (HCC)    Diastolic dysfunction    Dysphagia, post-stroke    Dysarthria, post-stroke    Hypokalemia    Acute blood loss anemia    Hyperlipidemia    Stroke (HCC) 07/16/2017   Hypertension 07/16/2017   Arthritis of right hand 01/11/2017   Osteoarthritis of right knee 01/06/2016   Status post right partial knee replacement 01/06/2016   Past Medical History:  Diagnosis Date   Acute blood loss anemia    Anemia    Arthritis    "legs, knees" (01/07/2016)   Arthritis of right hand 01/11/2017   Coronary artery calcification seen on CT scan    Diabetes mellitus (HCC)    Diastolic dysfunction    Dysarthria, post-stroke    Dysphagia, post-stroke    Facial droop    GERD (gastroesophageal reflux disease)    no meds   High cholesterol    History of blood transfusion    Hyperlipidemia    Hypertension    Hypertensive heart disease    Hypokalemia    Left basal ganglia embolic stroke (HCC) 07/18/2017   Osteoarthritis of right knee 01/06/2016   Seasonal allergies    Shortness of breath dyspnea    if too active   Stroke (HCC) 07/16/2017   Syncope    passed out and was hospitalized for a couple of days   Type II diabetes mellitus (HCC)    does not take medicine diet controlled    Family History  Problem Relation Age of Onset   Stroke Mother    CAD Father     Past Surgical History:  Procedure Laterality Date    BUNIONECTOMY Bilateral    CATARACT EXTRACTION W/ INTRAOCULAR LENS  IMPLANT, BILATERAL Bilateral    INGUINAL HERNIA REPAIR Right    KNEE ARTHROSCOPY WITH EXCISION BAKER'S CYST Right    MR LOWER LEG LEFT ([facility] HX) Left    after a break   PARTIAL KNEE ARTHROPLASTY Right 01/06/2016   Procedure: RIGHT UNICOMPARTMENTAL KNEE ARTHROPLASTY;  Surgeon: Kathryne Hitch, MD;  Location: [facility] OR;  Service: Orthopedics;  Laterality: Right;   VEIN LIGATION AND STRIPPING Bilateral    Social History   Occupational History   Not on file  Tobacco Use  Smoking status: Never   Smokeless tobacco: Never  Substance and Sexual Activity   Alcohol use: No   Drug use: No   Sexual activity: Not on file

## 2021-06-02 ENCOUNTER — Other Ambulatory Visit: Payer: Self-pay | Admitting: Family Medicine

## 2021-06-02 DIAGNOSIS — Z1231 Encounter for screening mammogram for malignant neoplasm of breast: Secondary | ICD-10-CM

## 2021-06-19 ENCOUNTER — Telehealth: Payer: Self-pay

## 2021-06-19 NOTE — Telephone Encounter (Signed)
Patient called asking if she can take more than 100mg  of her Gabapentin

## 2021-06-19 NOTE — Telephone Encounter (Signed)
Patient son aware of the below message

## 2021-06-20 ENCOUNTER — Other Ambulatory Visit: Payer: Self-pay | Admitting: Physician Assistant

## 2021-06-22 NOTE — Telephone Encounter (Signed)
Please advise 

## 2021-07-22 ENCOUNTER — Other Ambulatory Visit: Payer: Self-pay | Admitting: Physician Assistant

## 2021-11-06 ENCOUNTER — Other Ambulatory Visit: Payer: Self-pay | Admitting: Physician Assistant

## 2021-11-09 ENCOUNTER — Other Ambulatory Visit: Payer: Self-pay | Admitting: Physician Assistant

## 2021-11-09 MED ORDER — TRAMADOL HCL 50 MG PO TABS: 50.0000 mg | ORAL_TABLET | Freq: Four times a day (QID) | ORAL | 0 refills | Status: DC | PRN

## 2022-01-04 ENCOUNTER — Ambulatory Visit (INDEPENDENT_AMBULATORY_CARE_PROVIDER_SITE_OTHER): Payer: Medicare HMO

## 2022-01-04 ENCOUNTER — Ambulatory Visit: Payer: Medicare HMO | Admitting: Orthopaedic Surgery

## 2022-01-04 ENCOUNTER — Encounter: Payer: Self-pay | Admitting: Orthopaedic Surgery

## 2022-01-04 ENCOUNTER — Other Ambulatory Visit: Payer: Self-pay

## 2022-01-04 DIAGNOSIS — Z96651 Presence of right artificial knee joint: Secondary | ICD-10-CM

## 2022-01-04 DIAGNOSIS — M25572 Pain in left ankle and joints of left foot: Secondary | ICD-10-CM

## 2022-01-04 DIAGNOSIS — G8929 Other chronic pain: Secondary | ICD-10-CM

## 2022-01-04 MED ORDER — TRAMADOL HCL 50 MG PO TABS: 100.0000 mg | ORAL_TABLET | Freq: Four times a day (QID) | ORAL | 0 refills | Status: DC | PRN

## 2022-01-04 NOTE — Progress Notes (Signed)
The patient is an 81 year old female that comes in for left ankle pain with swelling with no known injury.  This is been going on for about 2 weeks now.  She does ambulate with a cane has been walking wearing an ASO.  She points the medial aspect of her ankle as a source of her pain.  She is on Plavix so she cannot take anti-inflammatories.  She is also diabetic which is a type II diabetic.  She cannot have steroids because of that. ? ?Examination of her left ankle does show pain over the medial aspect of the ankle but no instability on exam.  She does have edema in both her legs. ? ?3 views left ankle show no obvious fracture. ? ?She likely has tendinitis of her ankle and I recommended a short walking boot.  She is asking about stronger pain medications but I am not comfortable putting anyone on hydrocodone for this situation.  She can try Voltaren gel.  I can send in some tramadol to have on occasion.  She is also had some home health agency work with her and she said she needs equipment such as a shower chair and a bedside commode and other equipment.  We can certainly look into see if this is feasible for her to have and we can order this.  I can then see her back in 3 weeks to see how she is doing overall. ?

## 2022-01-05 ENCOUNTER — Telehealth: Payer: Self-pay | Admitting: Orthopaedic Surgery

## 2022-01-05 NOTE — Telephone Encounter (Signed)
Faxed to provided number  

## 2022-01-05 NOTE — Telephone Encounter (Signed)
[facility] called PT:  ?Eval orders. Base ion disgnoses from 04/10.  ? ?Would like OV notes and medication list faxed  ?CB [phone] ?Fax [phone] ?

## 2022-01-06 NOTE — Telephone Encounter (Signed)
[facility] called and states that they did not receive the referral orders. Please call.  ? ?CB [phone]  ?

## 2022-01-11 ENCOUNTER — Telehealth: Payer: Self-pay | Admitting: Orthopaedic Surgery

## 2022-01-11 NOTE — Telephone Encounter (Signed)
Thayer Ohm (PT) from [facility] called for home health pt. Asking 2 wk 1 and 1 wk 4. If unable to answer phone please leave vm on secure line. Phone number is [phone]. ?

## 2022-01-12 NOTE — Telephone Encounter (Signed)
Verbal order given  

## 2022-01-25 ENCOUNTER — Encounter: Payer: Self-pay | Admitting: Orthopaedic Surgery

## 2022-01-25 ENCOUNTER — Ambulatory Visit: Payer: Medicare HMO | Admitting: Orthopaedic Surgery

## 2022-01-25 DIAGNOSIS — M25562 Pain in left knee: Secondary | ICD-10-CM

## 2022-01-25 DIAGNOSIS — G8929 Other chronic pain: Secondary | ICD-10-CM

## 2022-01-25 DIAGNOSIS — M25572 Pain in left ankle and joints of left foot: Secondary | ICD-10-CM | POA: Diagnosis not present

## 2022-01-25 MED ORDER — GABAPENTIN 100 MG PO CAPS: 100.0000 mg | ORAL_CAPSULE | Freq: Two times a day (BID) | ORAL | 1 refills | Status: DC | PRN

## 2022-01-25 MED ORDER — LIDOCAINE HCL 1 % IJ SOLN
3.0000 mL | INTRAMUSCULAR | Status: AC | PRN
  Administered 2022-01-25: 3 mL

## 2022-01-25 MED ORDER — METHYLPREDNISOLONE ACETATE 40 MG/ML IJ SUSP
40.0000 mg | INTRAMUSCULAR | Status: AC | PRN
  Administered 2022-01-25: 40 mg via INTRA_ARTICULAR

## 2022-01-25 MED ORDER — TRAMADOL HCL 50 MG PO TABS: 100.0000 mg | ORAL_TABLET | Freq: Four times a day (QID) | ORAL | 0 refills | Status: DC | PRN

## 2022-01-25 NOTE — Progress Notes (Signed)
? ?Office Visit Note ?  ?Patient: Kristi David           ?Date of Birth: 04-09-1941           ?MRN: [id number] ?Visit Date: 01/25/2022 ?             ?Requested by: Sigmund Hazel, MD ?[address] ?[place],  [place] [address] ?PCP: Sigmund Hazel, MD ? ? ?Assessment & Plan: ?Visit Diagnoses:  ?1. Pain in left ankle and joints of left foot   ?2. Chronic pain of left knee   ? ? ?Plan: I did place a steroid injection in her left knee today per her request.  I will refill her Neurontin and tramadol and she knows to use these sparingly.  Follow-up for now is as needed. ? ?Follow-Up Instructions: Return if symptoms worsen or fail to improve.  ? ?Orders:  ?Orders Placed This Encounter  ?Procedures  ? Large Joint Inj  ? ?Meds ordered this encounter  ?Medications  ? traMADol (ULTRAM) 50 MG tablet  ?  Sig: Take 2 tablets (100 mg total) by mouth every 6 (six) hours as needed.  ?  Dispense:  40 tablet  ?  Refill:  0  ? gabapentin (NEURONTIN) 100 MG capsule  ?  Sig: Take 1 capsule (100 mg total) by mouth 2 (two) times daily as needed.  ?  Dispense:  60 capsule  ?  Refill:  1  ?  PATIENT REQUESTS REFILLS PLEASE AND THANK YOU  ? ? ? ? Procedures: ?Large Joint Inj: L knee on 01/25/2022 1:46 PM ?Indications: diagnostic evaluation and pain ?Details: 22 G 1.5 in needle, superolateral approach ? ?Arthrogram: No ? ?Medications: 3 mL lidocaine 1 %; 40 mg methylPREDNISolone acetate 40 MG/ML ?Outcome: tolerated well, no immediate complications ?Procedure, treatment alternatives, risks and benefits explained, specific risks discussed. Consent was given by the patient. Immediately prior to procedure a time out was called to verify the correct patient, procedure, equipment, support staff and site/side marked as required. Patient was prepped and draped in the usual sterile fashion.  ? ? ? ? ?Clinical Data: ?No additional findings. ? ? ?Subjective: ?Chief Complaint  ?Patient presents with  ? Left Ankle - Follow-up  ?The patient is a 81 year old  female comes in today for follow-up as it relates to her left ankle pain.  She was hurting on the medial aspect of the right ankle.  She is active and has been using a cane and having therapy work with her at home.  She is also altered an ankle brace and a cam walking boot.  She is in regular shoes today.  She also has known osteoarthritis of her left knee and is requesting a steroid injection in her left knee.  Its been over 8 months since her last steroid injection in that knee.  She does report that her ankle is feeling much better overall and she is doing better.  She feels like it is almost back to normal she states. ? ?HPI ? ?Review of Systems ?There is no listed fever, chills, nausea, vomiting ? ?Objective: ?Vital Signs: There were no vitals taken for this visit. ? ?Physical Exam ?She is alert and oriented x3 and in no acute distress ?Ortho Exam ?Examination of her left knee shows a slight effusion with global pain and tenderness throughout the arc of motion.  There is no significant tenderness of her left ankle with full range of motion of the left ankle. ?Specialty Comments:  ?No specialty comments available. ? ?  Imaging: ?No results found. ? ? ?PMFS History: ?Patient Active Problem List  ? Diagnosis Date Noted  ? Unilateral primary osteoarthritis, right knee 02/06/2020  ? Unilateral primary osteoarthritis, left knee 02/06/2020  ? Adhesive capsulitis of right shoulder 11/28/2017  ? Left basal ganglia embolic stroke (HCC) 07/18/2017  ? Facial droop   ? Right sided weakness   ? Diabetes mellitus (HCC)   ? Diastolic dysfunction   ? Dysphagia, post-stroke   ? Dysarthria, post-stroke   ? Hypokalemia   ? Acute blood loss anemia   ? Hyperlipidemia   ? Stroke (HCC) 07/16/2017  ? Hypertension 07/16/2017  ? Arthritis of right hand 01/11/2017  ? Osteoarthritis of right knee 01/06/2016  ? Status post right partial knee replacement 01/06/2016  ? ?Past Medical History:  ?Diagnosis Date  ? Acute blood loss anemia   ?  Anemia   ? Arthritis   ? "legs, knees" (01/07/2016)  ? Arthritis of right hand 01/11/2017  ? Coronary artery calcification seen on CT scan   ? Diabetes mellitus (HCC)   ? Diastolic dysfunction   ? Dysarthria, post-stroke   ? Dysphagia, post-stroke   ? Facial droop   ? GERD (gastroesophageal reflux disease)   ? no meds  ? High cholesterol   ? History of blood transfusion   ? Hyperlipidemia   ? Hypertension   ? Hypertensive heart disease   ? Hypokalemia   ? Left basal ganglia embolic stroke (HCC) 07/18/2017  ? Osteoarthritis of right knee 01/06/2016  ? Seasonal allergies   ? Shortness of breath dyspnea   ? if too active  ? Stroke (HCC) 07/16/2017  ? Syncope   ? passed out and was hospitalized for a couple of days  ? Type II diabetes mellitus (HCC)   ? does not take medicine diet controlled  ?  ?Family History  ?Problem Relation Age of Onset  ? Stroke Mother   ? CAD Father   ?  ?Past Surgical History:  ?Procedure Laterality Date  ? BUNIONECTOMY Bilateral   ? CATARACT EXTRACTION W/ INTRAOCULAR LENS  IMPLANT, BILATERAL Bilateral   ? INGUINAL HERNIA REPAIR Right   ? KNEE ARTHROSCOPY WITH EXCISION BAKER'S CYST Right   ? MR LOWER LEG LEFT ([facility] HX) Left   ? after a break  ? PARTIAL KNEE ARTHROPLASTY Right 01/06/2016  ? Procedure: RIGHT UNICOMPARTMENTAL KNEE ARTHROPLASTY;  Surgeon: Kathryne Hitch, MD;  Location: [facility] OR;  Service: Orthopedics;  Laterality: Right;  ? VEIN LIGATION AND STRIPPING Bilateral   ? ?Social History  ? ?Occupational History  ? Not on file  ?Tobacco Use  ? Smoking status: Never  ? Smokeless tobacco: Never  ?Substance and Sexual Activity  ? Alcohol use: No  ? Drug use: No  ? Sexual activity: Not on file  ? ? ? ? ? ? ?

## 2022-05-27 ENCOUNTER — Other Ambulatory Visit: Payer: Self-pay | Admitting: Orthopaedic Surgery

## 2022-11-10 ENCOUNTER — Telehealth: Payer: Self-pay

## 2022-11-10 ENCOUNTER — Ambulatory Visit (INDEPENDENT_AMBULATORY_CARE_PROVIDER_SITE_OTHER): Payer: Medicare HMO | Admitting: Orthopaedic Surgery

## 2022-11-10 DIAGNOSIS — M1712 Unilateral primary osteoarthritis, left knee: Secondary | ICD-10-CM | POA: Diagnosis not present

## 2022-11-10 DIAGNOSIS — G8929 Other chronic pain: Secondary | ICD-10-CM | POA: Diagnosis not present

## 2022-11-10 DIAGNOSIS — M25562 Pain in left knee: Secondary | ICD-10-CM

## 2022-11-10 MED ORDER — TRAMADOL HCL 50 MG PO TABS: 50.0000 mg | ORAL_TABLET | Freq: Two times a day (BID) | ORAL | 0 refills | Status: DC | PRN

## 2022-11-10 MED ORDER — GABAPENTIN 600 MG PO TABS: 600.0000 mg | ORAL_TABLET | Freq: Every day | ORAL | 1 refills | Status: AC

## 2022-11-10 NOTE — Telephone Encounter (Signed)
Left knee gel injection ?

## 2022-11-10 NOTE — Progress Notes (Signed)
The patient is well-known to me.  She is a 82 year old female who comes in with continued pain in her legs and especially her left knee.  She says both her feet hurt her quite a bit.  She does have calluses and corns with her feet and a bunion deformity on the right side.  We have placed a steroid injection before and her left knee with known arthritis in that left knee.  We have x-rayed her ankle before as well which showed no acute changes.  She did request a steroid injection in her left knee today.  Is been since May of last year that we did this to her left knee.  She actually has a partial knee replacement on the right side.  I was able to successfully place a steroid injection in her left knee today easily.  At this point she is a candidate for trying hyaluronic acid as well.  We will certainly order that for her knee for the left side.  She would like to see Dr. Sharol Given to assess her feet I think this is reasonable so we can set up appointment for Dr. Sharol Given to see her for bunions and corns.  Originally she said she was on 600 mg of gabapentin which helped significantly with her leg pain.  She has been on 100 mg but would like this to be increased to 600 mg so I will send that into her pharmacy.  She also wants occasional tramadol which has had to use sparingly and I did send this in as well.  Will order hyaluronic acid for her as a neck step to treat the pain from osteoarthritis of her left knee.  This patient is diagnosed with osteoarthritis of the knee(s).    Radiographs show evidence of joint space narrowing, osteophytes, subchondral sclerosis and/or subchondral cysts.  This patient has knee pain which interferes with functional and activities of daily living.    This patient has experienced inadequate response, adverse effects and/or intolerance with conservative treatments such as acetaminophen, NSAIDS, topical creams, physical therapy or regular exercise, knee bracing and/or weight loss.   This  patient has experienced inadequate response or has a contraindication to intra articular steroid injections for at least 3 months.   This patient is not scheduled to have a total knee replacement within 6 months of starting treatment with viscosupplementation.    Office Visit Note   Patient: Kristi David           Date of Birth: [birth year]           MRN: [id number] Visit Date:               Requested by: Kathyrn Lass, [place],  [place] [address] PCP: Kathyrn Lass, MD   Assessment & Plan: Visit Diagnoses:  1. Chronic pain of left knee   2. Unilateral primary osteoarthritis, left knee     Plan: See above note.  Follow-Up Instructions: Return in about 5 weeks (around 12/15/2022).   Orders:  No orders of the defined types were placed in this encounter.  Meds ordered this encounter  Medications   gabapentin (NEURONTIN) 600 MG tablet    Sig: Take 1 tablet (600 mg total) by mouth at bedtime.    Dispense:  60 tablet    Refill:  1   traMADol (ULTRAM) 50 MG tablet    Sig: Take 1-2 tablets (50-100 mg total) by mouth every 12 (twelve) hours as needed.    Dispense:  40 tablet    Refill:  0      Procedures: No procedures performed   Clinical Data: No additional findings.   Subjective: Chief Complaint  Patient presents with   Left Foot - Pain   Right Foot - Pain   Left Leg - Pain   Right Leg - Pain    HPI  Review of Systems   Objective: Vital Signs: There were no vitals taken for this visit.  Physical Exam  Ortho Exam  Specialty Comments:  No specialty comments available.  Imaging: No results found.   PMFS History: Patient Active Problem List   Diagnosis Date Noted   Unilateral primary osteoarthritis, right knee 02/06/2020   Unilateral primary osteoarthritis, left knee 02/06/2020   Adhesive capsulitis of right shoulder 11/28/2017   Left basal ganglia embolic stroke ([place]) 0000000   Facial droop    Right sided weakness     Diabetes mellitus ([place])    Diastolic dysfunction    Dysphagia, post-stroke    Dysarthria, post-stroke    Hypokalemia    Acute blood loss anemia    Hyperlipidemia    Stroke ([place]) 07/16/2017   Hypertension 07/16/2017   Arthritis of right hand 01/11/2017   Osteoarthritis of right knee 01/06/2016   Status post right partial knee replacement 01/06/2016   Past Medical History:  Diagnosis Date   Acute blood loss anemia    Anemia    Arthritis    "legs, knees" (01/07/2016)   Arthritis of right hand 01/11/2017   Coronary artery calcification seen on CT scan    Diabetes mellitus (HCC)    Diastolic dysfunction    Dysarthria, post-stroke    Dysphagia, post-stroke    Facial droop    GERD (gastroesophageal reflux disease)    no meds   High cholesterol    History of blood transfusion    Hyperlipidemia    Hypertension    Hypertensive heart disease    Hypokalemia    Left basal ganglia embolic stroke ([place]) 0000000   Osteoarthritis of right knee 01/06/2016   Seasonal allergies    Shortness of breath dyspnea    if too active   Stroke ([place]) 07/16/2017   Syncope    passed out and was hospitalized for a couple of days   Type II diabetes mellitus ([place])    does not take medicine diet controlled    Family History  Problem Relation Age of Onset   Stroke Mother    CAD Father     Past Surgical History:  Procedure Laterality Date   BUNIONECTOMY Bilateral    CATARACT EXTRACTION W/ INTRAOCULAR LENS  IMPLANT, BILATERAL Bilateral    INGUINAL HERNIA REPAIR Right    KNEE ARTHROSCOPY WITH EXCISION BAKER'S CYST Right    MR LOWER LEG LEFT ([facility] HX) Left    after a break   PARTIAL KNEE ARTHROPLASTY Right 01/06/2016   Procedure: RIGHT UNICOMPARTMENTAL KNEE ARTHROPLASTY;  Surgeon: Mcarthur Rossetti, MD;  Location: [facility];  Service: Orthopedics;  Laterality: Right;   VEIN LIGATION AND STRIPPING Bilateral    Social History   Occupational History   Not on file  Tobacco Use   Smoking status:  Never   Smokeless tobacco: Never  Substance and Sexual Activity   Alcohol use: No   Drug use: No   Sexual activity: Not on file

## 2022-11-12 NOTE — Telephone Encounter (Signed)
VOB submitted for Durolane, left knee

## 2022-11-15 ENCOUNTER — Telehealth: Payer: Self-pay

## 2022-11-15 NOTE — Telephone Encounter (Signed)
PA required for Durolane, left knee. PA pending

## 2022-11-19 ENCOUNTER — Other Ambulatory Visit: Payer: Self-pay

## 2022-11-19 DIAGNOSIS — M1712 Unilateral primary osteoarthritis, left knee: Secondary | ICD-10-CM

## 2022-11-25 ENCOUNTER — Ambulatory Visit: Payer: Medicare HMO | Admitting: Orthopedic Surgery

## 2022-12-02 ENCOUNTER — Ambulatory Visit (INDEPENDENT_AMBULATORY_CARE_PROVIDER_SITE_OTHER): Payer: Medicare HMO | Admitting: Orthopedic Surgery

## 2022-12-02 ENCOUNTER — Ambulatory Visit (INDEPENDENT_AMBULATORY_CARE_PROVIDER_SITE_OTHER): Payer: Medicare HMO

## 2022-12-02 DIAGNOSIS — M21619 Bunion of unspecified foot: Secondary | ICD-10-CM

## 2022-12-02 DIAGNOSIS — M79672 Pain in left foot: Secondary | ICD-10-CM

## 2022-12-02 DIAGNOSIS — M79671 Pain in right foot: Secondary | ICD-10-CM | POA: Diagnosis not present

## 2022-12-02 DIAGNOSIS — I739 Peripheral vascular disease, unspecified: Secondary | ICD-10-CM

## 2022-12-02 DIAGNOSIS — I872 Venous insufficiency (chronic) (peripheral): Secondary | ICD-10-CM | POA: Diagnosis not present

## 2022-12-08 ENCOUNTER — Encounter: Payer: Self-pay | Admitting: Orthopedic Surgery

## 2022-12-08 NOTE — Progress Notes (Signed)
Office Visit Note   Patient: Kristi David           Date of Birth: 1941-09-05           MRN: [id number] Visit Date: 12/02/2022              Requested by: Kathyrn Lass, [place],  [place] [address] PCP: Kathyrn Lass, MD  Chief Complaint  Patient presents with   Right Foot - Pain   Left Foot - Pain      HPI: Patient is a 82 year old woman who was seen in referral from Dr. Ninfa Linden for bunion deformity right foot and left forefoot pain.  Assessment & Plan: Visit Diagnoses:  1. Bilateral foot pain   2. Bunion of great toe     Plan: Recommended elevation and compression for the venous insufficiency.  Recommended a wide toe box and stiff shoe such as a new balance walking sneaker.  With patient's peripheral vascular disease do not recommend surgery for the forefoot.  Follow-Up Instructions: No follow-ups on file.   Ortho Exam  Patient is alert, oriented, no adenopathy, well-dressed, normal affect, normal respiratory effort. Examination patient has pain to palpation beneath the left second metatarsal head there is no redness or cellulitis this seems consistent with the avascular necrosis.  Patient has varicose veins in both feet with venous stasis insufficiency swelling bilaterally.  She is on Plavix with a history of a stroke.  She does not have palpable pulses bilaterally.  There are no open ulcers around the bunion deformity of the right great toe.  Imaging: No results found. No images are attached to the encounter.  Labs: Lab Results  Component Value Date   HGBA1C 5.5 07/17/2017   HGBA1C 5.2 12/31/2015   ESRSEDRATE 82 (H) 07/17/2017     Lab Results  Component Value Date   ALBUMIN 3.4 (L) 07/19/2017   ALBUMIN 3.2 (L) 07/17/2017   ALBUMIN 3.5 07/16/2017    No results found for: "MG" No results found for: "VD25OH"  No results found for: "PREALBUMIN"    Latest Ref Rng & Units 07/19/2017    8:56 AM 07/18/2017    2:28 AM 07/17/2017    6:58  AM  CBC EXTENDED  WBC 4.0 - 10.5 K/uL 4.7  4.4  5.3   RBC 3.87 - 5.11 MIL/uL 4.39  3.89  4.12   Hemoglobin 12.0 - 15.0 g/dL 12.2  11.3  11.5   HCT 36.0 - 46.0 % 37.2  33.6  34.8    34.5   Platelets 150 - 400 K/uL 222  164  200   NEUT# 1.7 - 7.7 K/uL 2.5     Lymph# 0.7 - 4.0 K/uL 1.8        There is no height or weight on file to calculate BMI.  Orders:  Orders Placed This Encounter  Procedures   XR Foot 2 Views Right   XR Foot 2 Views Left   No orders of the defined types were placed in this encounter.    Procedures: No procedures performed  Clinical Data: No additional findings.  ROS:  All other systems negative, except as noted in the HPI. Review of Systems  Objective: Vital Signs: There were no vitals taken for this visit.  Specialty Comments:  No specialty comments available.  PMFS History: Patient Active Problem List   Diagnosis Date Noted   Unilateral primary osteoarthritis, right knee 02/06/2020   Unilateral primary osteoarthritis, left knee 02/06/2020   Adhesive capsulitis  of right shoulder 11/28/2017   Left basal ganglia embolic stroke ([facility]) 0000000   Facial droop    Right sided weakness    Diabetes mellitus ([facility])    Diastolic dysfunction    Dysphagia, post-stroke    Dysarthria, post-stroke    Hypokalemia    Acute blood loss anemia    Hyperlipidemia    Stroke ([facility]) 07/16/2017   Hypertension 07/16/2017   Arthritis of right hand 01/11/2017   Osteoarthritis of right knee 01/06/2016   Status post right partial knee replacement 01/06/2016   Past Medical History:  Diagnosis Date   Acute blood loss anemia    Anemia    Arthritis    "legs, knees" (01/07/2016)   Arthritis of right hand 01/11/2017   Coronary artery calcification seen on CT scan    Diabetes mellitus ([facility])    Diastolic dysfunction    Dysarthria, post-stroke    Dysphagia, post-stroke    Facial droop    GERD (gastroesophageal reflux disease)    no meds   High cholesterol     History of blood transfusion    Hyperlipidemia    Hypertension    Hypertensive heart disease    Hypokalemia    Left basal ganglia embolic stroke ([facility]) 0000000   Osteoarthritis of right knee 01/06/2016   Seasonal allergies    Shortness of breath dyspnea    if too active   Stroke ([facility]) 07/16/2017   Syncope    passed out and was hospitalized for a couple of days   Type II diabetes mellitus ([facility])    does not take medicine diet controlled    Family History  Problem Relation Age of Onset   Stroke Mother    CAD Father     Past Surgical History:  Procedure Laterality Date   BUNIONECTOMY Bilateral    CATARACT EXTRACTION W/ INTRAOCULAR LENS  IMPLANT, BILATERAL Bilateral    INGUINAL HERNIA REPAIR Right    KNEE ARTHROSCOPY WITH EXCISION BAKER'S CYST Right    MR LOWER LEG LEFT ([facility] HX) Left    after a break   PARTIAL KNEE ARTHROPLASTY Right 01/06/2016   Procedure: RIGHT UNICOMPARTMENTAL KNEE ARTHROPLASTY;  Surgeon: Mcarthur Rossetti, MD;  Location: [facility];  Service: Orthopedics;  Laterality: Right;   VEIN LIGATION AND STRIPPING Bilateral    Social History   Occupational History   Not on file  Tobacco Use   Smoking status: Never   Smokeless tobacco: Never  Substance and Sexual Activity   Alcohol use: No   Drug use: No   Sexual activity: Not on file

## 2022-12-15 ENCOUNTER — Ambulatory Visit: Payer: Medicare HMO | Admitting: Orthopaedic Surgery

## 2022-12-15 ENCOUNTER — Encounter: Payer: Self-pay | Admitting: Orthopaedic Surgery

## 2022-12-15 DIAGNOSIS — M1712 Unilateral primary osteoarthritis, left knee: Secondary | ICD-10-CM

## 2022-12-15 MED ORDER — SODIUM HYALURONATE 60 MG/3ML IX PRSY
60.0000 mg | PREFILLED_SYRINGE | INTRA_ARTICULAR | Status: AC | PRN
  Administered 2022-12-15: 60 mg via INTRA_ARTICULAR

## 2022-12-15 MED ORDER — LIDOCAINE HCL 1 % IJ SOLN
3.0000 mL | INTRAMUSCULAR | Status: AC | PRN
  Administered 2022-12-15: 3 mL

## 2022-12-15 NOTE — Progress Notes (Signed)
   Procedure Note  Patient: Kristi David             Date of Birth: 23-Dec-1940           MRN: [id number]             Visit Date: 12/15/2022  Procedures: Visit Diagnoses:  1. Unilateral primary osteoarthritis, left knee     Large Joint Inj: L knee on 12/15/2022 10:25 AM Indications: diagnostic evaluation and pain Details: 22 G 1.5 in needle, superolateral approach  Arthrogram: No  Medications: 3 mL lidocaine 1 %; 60 mg Sodium Hyaluronate 60 MG/3ML Outcome: tolerated well, no immediate complications Procedure, treatment alternatives, risks and benefits explained, specific risks discussed. Consent was given by the patient. Immediately prior to procedure a time out was called to verify the correct patient, procedure, equipment, support staff and site/side marked as required. Patient was prepped and draped in the usual sterile fashion.    Lot [id number]  The patient comes in today for hyaluronic acid in her left knee to treat the pain from osteoarthritis.  This is with Durolane.  She has tried and failed other conservative treatment measures including steroid injection.  There is no knee joint effusion.  She has well-documented osteoarthritis of the left knee.  She has a previous right partial knee arthroplasty that does bother her some.  She did tolerate the injection her left knee today.  I will like to see her back in 6 weeks to see if she has had any results from this injection.

## 2022-12-27 ENCOUNTER — Other Ambulatory Visit: Payer: Self-pay

## 2022-12-27 ENCOUNTER — Emergency Department (HOSPITAL_COMMUNITY): Payer: Medicare HMO

## 2022-12-27 ENCOUNTER — Encounter (HOSPITAL_COMMUNITY): Payer: Self-pay | Admitting: Radiology

## 2022-12-27 ENCOUNTER — Emergency Department (HOSPITAL_COMMUNITY)
Admission: EM | Admit: 2022-12-27 | Discharge: 2022-12-28 | Disposition: A | Discharge status: Home / Self Care | Payer: Medicare HMO | Attending: Emergency Medicine | Admitting: Emergency Medicine

## 2022-12-27 DIAGNOSIS — E119 Type 2 diabetes mellitus without complications: Secondary | ICD-10-CM | POA: Diagnosis not present

## 2022-12-27 DIAGNOSIS — R600 Localized edema: Secondary | ICD-10-CM | POA: Diagnosis not present

## 2022-12-27 DIAGNOSIS — Z7902 Long term (current) use of antithrombotics/antiplatelets: Secondary | ICD-10-CM | POA: Diagnosis not present

## 2022-12-27 DIAGNOSIS — Z7984 Long term (current) use of oral hypoglycemic drugs: Secondary | ICD-10-CM | POA: Insufficient documentation

## 2022-12-27 DIAGNOSIS — Z91010 Allergy to peanuts: Secondary | ICD-10-CM | POA: Diagnosis not present

## 2022-12-27 DIAGNOSIS — R4182 Altered mental status, unspecified: Secondary | ICD-10-CM | POA: Diagnosis not present

## 2022-12-27 DIAGNOSIS — Z79899 Other long term (current) drug therapy: Secondary | ICD-10-CM | POA: Diagnosis not present

## 2022-12-27 DIAGNOSIS — I10 Essential (primary) hypertension: Secondary | ICD-10-CM | POA: Diagnosis not present

## 2022-12-27 DIAGNOSIS — R55 Syncope and collapse: Secondary | ICD-10-CM | POA: Diagnosis not present

## 2022-12-27 LAB — CBC
HCT: 33.8 % — ABNORMAL LOW (ref 36.0–46.0)
Hemoglobin: 11 g/dL — ABNORMAL LOW (ref 12.0–15.0)
MCH: 29.1 pg (ref 26.0–34.0)
MCHC: 32.5 g/dL (ref 30.0–36.0)
MCV: 89.4 fL (ref 80.0–100.0)
Platelets: 173 10*3/uL (ref 150–400)
RBC: 3.78 MIL/uL — ABNORMAL LOW (ref 3.87–5.11)
RDW: 13.7 % (ref 11.5–15.5)
WBC: 5.3 10*3/uL (ref 4.0–10.5)
nRBC: 0 % (ref 0.0–0.2)

## 2022-12-27 LAB — CBG MONITORING, ED: Glucose-Capillary: 101 mg/dL — ABNORMAL HIGH (ref 70–99)

## 2022-12-27 LAB — BASIC METABOLIC PANEL
Anion gap: 9 (ref 5–15)
BUN: 14 mg/dL (ref 8–23)
CO2: 24 mmol/L (ref 22–32)
Calcium: 8.4 mg/dL — ABNORMAL LOW (ref 8.9–10.3)
Chloride: 106 mmol/L (ref 98–111)
Creatinine, Ser: 0.87 mg/dL (ref 0.44–1.00)
GFR, Estimated: >60 mL/min (ref >60)
Glucose, Bld: 128 mg/dL — ABNORMAL HIGH (ref 70–99)
Potassium: 3.6 mmol/L (ref 3.5–5.1)
Sodium: 139 mmol/L (ref 135–145)

## 2022-12-27 NOTE — ED Triage Notes (Signed)
Pt sitting at dinner table and had a syncopal episode. Pt was easily aroused by family. EMS initial pressure was 90/40 pt received 200 ml NS and pressure is now 136/72.  Pt has had an old stroke with R facial droop and R weakness. This is not new.

## 2022-12-28 ENCOUNTER — Emergency Department (HOSPITAL_COMMUNITY): Payer: Medicare HMO

## 2022-12-28 LAB — CBC WITH DIFFERENTIAL/PLATELET
Abs Immature Granulocytes: 0.02 10*3/uL (ref 0.00–0.07)
Basophils Absolute: 0 10*3/uL (ref 0.0–0.1)
Basophils Relative: 1 %
Eosinophils Absolute: 0.3 10*3/uL (ref 0.0–0.5)
Eosinophils Relative: 5 %
HCT: 37.7 % (ref 36.0–46.0)
Hemoglobin: 11.9 g/dL — ABNORMAL LOW (ref 12.0–15.0)
Immature Granulocytes: 0 %
Lymphocytes Relative: 26 %
Lymphs Abs: 1.7 10*3/uL (ref 0.7–4.0)
MCH: 29 pg (ref 26.0–34.0)
MCHC: 31.6 g/dL (ref 30.0–36.0)
MCV: 91.7 fL (ref 80.0–100.0)
Monocytes Absolute: 0.6 10*3/uL (ref 0.1–1.0)
Monocytes Relative: 8 %
Neutro Abs: 4.1 10*3/uL (ref 1.7–7.7)
Neutrophils Relative %: 60 %
Platelets: 186 10*3/uL (ref 150–400)
RBC: 4.11 MIL/uL (ref 3.87–5.11)
RDW: 13.8 % (ref 11.5–15.5)
WBC: 6.8 10*3/uL (ref 4.0–10.5)
nRBC: 0 % (ref 0.0–0.2)

## 2022-12-28 LAB — TROPONIN I (HIGH SENSITIVITY)
Troponin I (High Sensitivity): 6 ng/L (ref <18)
Troponin I (High Sensitivity): 5 ng/L (ref <18)

## 2022-12-28 LAB — URINALYSIS, ROUTINE W REFLEX MICROSCOPIC
Bilirubin Urine: NEGATIVE
Glucose, UA: NEGATIVE mg/dL
Hgb urine dipstick: NEGATIVE
Ketones, ur: NEGATIVE mg/dL
Nitrite: NEGATIVE
Protein, ur: NEGATIVE mg/dL
Specific Gravity, Urine: 1.014 (ref 1.005–1.030)
pH: 7 (ref 5.0–8.0)

## 2022-12-28 LAB — PROTIME-INR
INR: 1 (ref 0.8–1.2)
Prothrombin Time: 12.5 seconds (ref 11.4–15.2)

## 2022-12-28 LAB — RAPID URINE DRUG SCREEN, HOSP PERFORMED
Amphetamines: NOT DETECTED
Barbiturates: NOT DETECTED
Benzodiazepines: NOT DETECTED
Cocaine: NOT DETECTED
Opiates: NOT DETECTED
Tetrahydrocannabinol: NOT DETECTED

## 2022-12-28 LAB — APTT: aPTT: 20 seconds — ABNORMAL LOW (ref 24–36)

## 2022-12-28 NOTE — ED Provider Notes (Signed)
Patient has remained stable [facility] Provider Note   CSN: [id number] Arrival date & time: 12/27/22  2153     History {Add pertinent medical, surgical, social history, OB history to HPI:1} Chief Complaint  Patient presents with  . Loss of Consciousness    Kristi David is a 82 y.o. female.  HPI     This is an 82 year old female who presents with an episode of altered mental status at dinner.  Per her family, she was sitting at the dinner table.  They noticed that her drink fell over.  She appeared to be unresponsive although she never lost posture.  Family noted that she was leaning to the right.  No tonic-clonic activity was noted.  She would not respond to their questioning.  Patient took several minutes to "come to."  They report that it took up to 15 minutes for her to "get back to herself."  No history of seizures but has a history of stroke.  She is on Plavix for preventative measures.  No recent illnesses.  Currently she feels back to her baseline.  Family agrees.  Home Medications Prior to Admission medications   Medication Sig Start Date End Date Taking? Authorizing Provider  atorvastatin (LIPITOR) 80 MG tablet Take 1 tablet (80 mg total) by mouth daily at 6 PM. 08/25/17   Meredith Staggers, MD  blood glucose meter kit and supplies Dispense based on patient and insurance preference. Use up to four times daily as directed. (FOR ICD-9 250.00, 250.01). 07/28/17   Angiulli, Lavon Paganini, PA-C  citalopram (CELEXA) 40 MG tablet Take 1 tablet (40 mg total) by mouth daily. 07/28/17   Angiulli, Lavon Paganini, PA-C  clopidogrel (PLAVIX) 75 MG tablet Take 1 tablet (75 mg total) by mouth daily. 07/29/17   Angiulli, Lavon Paganini, PA-C  gabapentin (NEURONTIN) 600 MG tablet Take 1 tablet (600 mg total) by mouth at bedtime. 11/10/22   Mcarthur Rossetti, MD  hyoscyamine (LEVSIN, ANASPAZ) 0.125 MG tablet Take 1 tablet (0.125 mg total) by mouth every 6 (six)  hours as needed. 08/30/17   Meredith Staggers, MD  losartan (COZAAR) 100 MG tablet Take 1 tablet (100 mg total) by mouth daily. 07/28/17   Angiulli, Lavon Paganini, PA-C  metoprolol succinate (TOPROL-XL) 25 MG 24 hr tablet Take 1 tablet (25 mg total) by mouth daily. 1 time curtesy refill for patient, further refill of this type of medication will need to be addressed by patients primary care provider 08/25/17   Meredith Staggers, MD  solifenacin (VESICARE) 10 MG tablet Take 10 mg by mouth daily as needed (frequent urination).     [provider]  traMADol (ULTRAM) 50 MG tablet Take 1-2 tablets (50-100 mg total) by mouth every 12 (twelve) hours as needed. 11/10/22   Mcarthur Rossetti, MD      Allergies    Chana Bode oil], Peanut-containing drug products, and Tomato    Review of Systems   Review of Systems  Constitutional:  Negative for fever.  Respiratory:  Negative for shortness of breath.   Cardiovascular:  Negative for chest pain.  Neurological:        Altered consciousness  All other systems reviewed and are negative.   Physical Exam Updated Vital Signs BP 132/62 (BP Location: Right Arm)   Pulse (!) 55   Temp 98.3 F (36.8 C) (Oral)   Resp 18   Ht 1.702 m (5\' 7" )   Wt 84.4 kg  SpO2 96%   BMI 29.13 kg/m  Physical Exam Vitals and nursing note reviewed.  Constitutional:      Appearance: She is well-developed. She is not ill-appearing.  HENT:     Head: Normocephalic and atraumatic.  Eyes:     Pupils: Pupils are equal, round, and reactive to light.  Cardiovascular:     Rate and Rhythm: Normal rate and regular rhythm.     Heart sounds: Normal heart sounds.  Pulmonary:     Effort: Pulmonary effort is normal. No respiratory distress.     Breath sounds: No wheezing.  Abdominal:     Palpations: Abdomen is soft.     Tenderness: There is no abdominal tenderness.  Musculoskeletal:     Cervical back: Neck supple.     Right lower leg: Edema present.     Left  lower leg: Edema present.  Skin:    General: Skin is warm and dry.  Neurological:     Mental Status: She is alert and oriented to person, place, and time.     Comments: Slight right-sided facial droop noted, 5 out of 5 strength in all 4 extremities, no drift noted, fluent speech  Psychiatric:        Mood and Affect: Mood normal.     ED Results / Procedures / Treatments   Labs (all labs ordered are listed, but only abnormal results are displayed) Labs Reviewed  BASIC METABOLIC PANEL - Abnormal; Notable for the following components:      Result Value   Glucose, Bld 128 (*)    Calcium 8.4 (*)    All other components within normal limits  CBC - Abnormal; Notable for the following components:   RBC 3.78 (*)    Hemoglobin 11.0 (*)    HCT 33.8 (*)    All other components within normal limits  URINALYSIS, ROUTINE W REFLEX MICROSCOPIC - Abnormal; Notable for the following components:   APPearance HAZY (*)    Leukocytes,Ua LARGE (*)    Bacteria, UA RARE (*)    All other components within normal limits  APTT - Abnormal; Notable for the following components:   aPTT 20 (*)    All other components within normal limits  CBC WITH DIFFERENTIAL/PLATELET - Abnormal; Notable for the following components:   Hemoglobin 11.9 (*)    All other components within normal limits  CBG MONITORING, ED - Abnormal; Notable for the following components:   Glucose-Capillary 101 (*)    All other components within normal limits  PROTIME-INR  RAPID URINE DRUG SCREEN, HOSP PERFORMED  ETHANOL  TROPONIN I (HIGH SENSITIVITY)  TROPONIN I (HIGH SENSITIVITY)    EKG EKG Interpretation  Date/Time:  Monday December 27 2022 22:05:02 EDT Ventricular Rate:  66 PR Interval:  164 QRS Duration: 82 QT Interval:  446 QTC Calculation: 467 R Axis:   -37 Text Interpretation: Normal sinus rhythm Left axis deviation Moderate voltage criteria for LVH, may be normal variant ( R in aVL , Cornell product ) Possible Anterolateral  infarct , age undetermined Abnormal ECG When compared with ECG of 16-Jul-2017 18:08, PREVIOUS ECG IS PRESENT Confirmed by Thayer Jew [phone]) on 12/27/2022 11:41:53 PM  Radiology CT HEAD WO CONTRAST (5MM)  Result Date: 12/28/2022 CLINICAL DATA:  TIA EXAM: CT HEAD WITHOUT CONTRAST TECHNIQUE: Contiguous axial images were obtained from the base of the skull through the vertex without intravenous contrast. RADIATION DOSE REDUCTION: This exam was performed according to the departmental dose-optimization program which includes automated exposure control, adjustment of  the mA and/or kV according to patient size and/or use of iterative reconstruction technique. COMPARISON:  07/16/2017 FINDINGS: Brain: No evidence of acute infarction, hemorrhage, hydrocephalus, extra-axial collection or mass lesion/mass effect. Remote perforator infarct at the left corona radiata. Vascular: No hyperdense vessel or unexpected calcification. Skull: Normal. Negative for fracture or focal lesion. Sinuses/Orbits: No acute finding. IMPRESSION: 1. No acute finding. 2. Remote perforator infarct at the left corona radiata. Electronically Signed   By: Jorje Guild M.D.   On: 12/28/2022 04:15   DG Chest Portable 1 View  Result Date: 12/27/2022 CLINICAL DATA:  Loss of consciousness EXAM: PORTABLE CHEST 1 VIEW COMPARISON:  07/16/2017 FINDINGS: A single frontal view of the chest demonstrates a stable cardiac silhouette. Stable ectasia and atherosclerosis of the thoracic aorta. No acute airspace disease, effusion, or pneumothorax. No acute bony abnormality. IMPRESSION: 1. No acute intrathoracic process. Electronically Signed   By: Randa Ngo M.D.   On: 12/27/2022 22:58    Procedures Procedures  {Document cardiac monitor, telemetry assessment procedure when appropriate:1}  Medications Ordered in ED Medications - No data to display  ED Course/ Medical Decision Making/ A&P Clinical Course as of 12/28/22 0734  Tue Dec 28, 2022   0714 Patient has remained stable.  Will obtain MRI to ensure no new stroke. [CH]  [id number] Spoke with Dr. Leonel Ramsay, neurology.  Feels that her old stroke would not likely be predisposing for seizure.  If MRI is negative, would treat as presyncope versus seizure and give precautions and neurology follow-up. [CH]    Clinical Course User Index [CH] Elvira Langston, Barbette Hair, MD   {   Click here for ABCD2, HEART and other calculatorsREFRESH Note before signing :1}                          Medical Decision Making Amount and/or Complexity of Data Reviewed Labs: ordered. Radiology: ordered.   ***  {Document critical care time when appropriate:1} {Document review of labs and clinical decision tools ie heart score, Chads2Vasc2 etc:1}  {Document your independent review of radiology images, and any outside records:1} {Document your discussion with family members, caretakers, and with consultants:1} {Document social determinants of health affecting pt's care:1} {Document your decision making why or why not admission, treatments were needed:1} Final Clinical Impression(s) / ED Diagnoses Final diagnoses:  None    Rx / DC Orders ED Discharge Orders     None

## 2022-12-28 NOTE — Discharge Instructions (Addendum)
We discussed your situation with on-call neurology Dr. Leonel Ramsay if MRI was negative you are okay for discharge home they are not convinced that it would be seizure related.  MRI shows nothing new or acute.  We would recommend follow-up with your primary care doctor and follow-up with neurology call to make an appointment to the number above.  Return for any recurrent passing out or any recurrent seizures.

## 2022-12-28 NOTE — ED Provider Notes (Signed)
Patient's MRI without any acute findings.  No evidence of new infarct.  Dr. Dina Rich had discussion with Dr. Leonel Ramsay and if MRI was negative was going to give her a pass on possible seizure and have her follow-up.  Did not want to start antiseizure meds.  Patient stable for discharge home.   Fredia Sorrow, MD 12/28/22 [phone]

## 2022-12-28 NOTE — ED Notes (Signed)
Upon walking into the room, pt is awake and looking at her phone. Pt is a&ox4, warm and dry. Pt requesting to use the restroom. Pt assisted to restroom. Once back from restroom pt reattached to monitor/vitals. Pt denies any pain or distress. Side rails up, covered with blankets and call light at the bedside.

## 2022-12-28 NOTE — ED Notes (Signed)
Patient transported back from MRI. 

## 2022-12-28 NOTE — ED Notes (Signed)
Pt and family agreed with discharge, instructions were reviewed, follow up care and prescription were also reviewed. Denies pain at the moment

## 2023-01-26 ENCOUNTER — Ambulatory Visit: Payer: Medicare HMO | Admitting: Orthopaedic Surgery

## 2023-01-26 ENCOUNTER — Telehealth: Payer: Self-pay

## 2023-01-26 ENCOUNTER — Other Ambulatory Visit (INDEPENDENT_AMBULATORY_CARE_PROVIDER_SITE_OTHER): Payer: Medicare HMO

## 2023-01-26 DIAGNOSIS — M1711 Unilateral primary osteoarthritis, right knee: Secondary | ICD-10-CM | POA: Diagnosis not present

## 2023-01-26 DIAGNOSIS — Z96651 Presence of right artificial knee joint: Secondary | ICD-10-CM

## 2023-01-26 DIAGNOSIS — G8929 Other chronic pain: Secondary | ICD-10-CM | POA: Diagnosis not present

## 2023-01-26 DIAGNOSIS — M25561 Pain in right knee: Secondary | ICD-10-CM | POA: Diagnosis not present

## 2023-01-26 MED ORDER — METHYLPREDNISOLONE ACETATE 40 MG/ML IJ SUSP
40.0000 mg | INTRAMUSCULAR | Status: AC | PRN
Start: 2023-01-26 — End: 2023-01-26
  Administered 2023-01-26: 40 mg via INTRA_ARTICULAR

## 2023-01-26 MED ORDER — TRAMADOL HCL 50 MG PO TABS: 50.0000 mg | ORAL_TABLET | Freq: Two times a day (BID) | ORAL | 0 refills | Status: AC | PRN

## 2023-01-26 MED ORDER — LIDOCAINE HCL 1 % IJ SOLN
3.0000 mL | INTRAMUSCULAR | Status: AC | PRN
Start: 2023-01-26 — End: 2023-01-26
  Administered 2023-01-26: 3 mL

## 2023-01-26 NOTE — Telephone Encounter (Signed)
Left knee gel injection ?

## 2023-01-26 NOTE — Progress Notes (Signed)
The patient comes in today with continued pain of her right knee with known osteoarthritis of the right knee.  She has remote history of a right partial knee arthroplasty.  She has developed with time arthritis in the lateral compartment of her right knee as well as the patellofemoral joint.  She does have arthritis of her left knee and had a hyaluronic acid injection in that knee about 6 weeks ago.  She says that is done well.  The right knee does have some patellofemoral crepitation and lateral tenderness to the knee.  The medial side has no tenderness there is posterior tenderness.  The knee is ligamentously stable but has painful arc of motion.  X-rays of the right knee shows no complicating features of the medial compartment arthroplasty but there is significant arthritis of the lateral compartment and the patellofemoral joint.  At this point she is certainly a candidate for hyaluronic acid for the right knee.  I did place a steroid injection into her knee today to temporize her pain while we order and get approved hyaluronic acid for the right knee.  She is requesting narcotic pain medications.  She understands that I can provide this may be just 1 more time but I would not keep her on long-term narcotics at all.  Counseled her about that as well. This patient is diagnosed with osteoarthritis of the knee(s).    Radiographs show evidence of joint space narrowing, osteophytes, subchondral sclerosis and/or subchondral cysts.  This patient has knee pain which interferes with functional and activities of daily living.    This patient has experienced inadequate response, adverse effects and/or intolerance with conservative treatments such as acetaminophen, NSAIDS, topical creams, physical therapy or regular exercise, knee bracing and/or weight loss.   This patient has experienced inadequate response or has a contraindication to intra articular steroid injections for at least 3 months.   This patient is  not scheduled to have a total knee replacement within 6 months of starting treatment with viscosupplementation.      Procedure Note  Patient: Kristi David             Date of Birth: June 19, 1941           MRN: [id number]             Visit Date: 01/26/2023  Procedures: Visit Diagnoses:  1. Status post right partial knee replacement   2. Unilateral primary osteoarthritis, right knee   3. Chronic pain of right knee     Large Joint Inj: R knee on 01/26/2023 1:46 PM Indications: diagnostic evaluation and pain Details: 22 G 1.5 in needle, superolateral approach  Arthrogram: No  Medications: 3 mL lidocaine 1 %; 40 mg methylPREDNISolone acetate 40 MG/ML Outcome: tolerated well, no immediate complications Procedure, treatment alternatives, risks and benefits explained, specific risks discussed. Consent was given by the patient. Immediately prior to procedure a time out was called to verify the correct patient, procedure, equipment, support staff and site/side marked as required. Patient was prepped and draped in the usual sterile fashion.

## 2023-01-27 NOTE — Telephone Encounter (Signed)
Patient just received gel injection on 12/15/2022 for left knee.  Next available gel injection will need to be after 06/17/2023.  Will submit in August, 2024.

## 2023-01-27 NOTE — Telephone Encounter (Signed)
Called and left a VM concerning gel injection for patient.

## 2023-02-17 ENCOUNTER — Telehealth: Payer: Self-pay

## 2023-02-17 NOTE — Telephone Encounter (Signed)
VOB submitted for Durolane, right knee.  

## 2023-02-18 ENCOUNTER — Telehealth: Payer: Self-pay

## 2023-02-18 NOTE — Telephone Encounter (Signed)
PA required for durolane, right knee.  PA started online through [id number] portal. PA Pending

## 2023-02-24 ENCOUNTER — Telehealth: Payer: Self-pay

## 2023-02-24 NOTE — Telephone Encounter (Signed)
PA currently still pending for Durolane, right knee.

## 2023-03-03 ENCOUNTER — Other Ambulatory Visit: Payer: Self-pay

## 2023-03-03 DIAGNOSIS — M1711 Unilateral primary osteoarthritis, right knee: Secondary | ICD-10-CM

## 2023-03-22 ENCOUNTER — Ambulatory Visit: Payer: Medicare HMO | Admitting: Physician Assistant

## 2023-03-23 ENCOUNTER — Telehealth (HOSPITAL_BASED_OUTPATIENT_CLINIC_OR_DEPARTMENT_OTHER): Payer: Self-pay

## 2023-03-23 NOTE — Telephone Encounter (Signed)
Pt needs to be r/s once we have durolane in stock

## 2023-03-23 NOTE — Telephone Encounter (Signed)
Called and r/s pt 

## 2023-03-29 ENCOUNTER — Ambulatory Visit: Payer: Medicare HMO | Admitting: Physician Assistant

## 2023-03-29 ENCOUNTER — Encounter: Payer: Self-pay | Admitting: Physician Assistant

## 2023-03-29 DIAGNOSIS — M1712 Unilateral primary osteoarthritis, left knee: Secondary | ICD-10-CM

## 2023-03-29 DIAGNOSIS — M1711 Unilateral primary osteoarthritis, right knee: Secondary | ICD-10-CM | POA: Diagnosis not present

## 2023-03-29 MED ORDER — SODIUM HYALURONATE 60 MG/3ML IX PRSY
60.0000 mg | PREFILLED_SYRINGE | INTRA_ARTICULAR | Status: AC | PRN
Start: 2023-03-29 — End: 2023-03-29
  Administered 2023-03-29: 60 mg via INTRA_ARTICULAR

## 2023-03-29 MED ORDER — METHYLPREDNISOLONE ACETATE 40 MG/ML IJ SUSP
40.0000 mg | INTRAMUSCULAR | Status: AC | PRN
Start: 2023-03-29 — End: 2023-03-29
  Administered 2023-03-29: 40 mg via INTRA_ARTICULAR

## 2023-03-29 MED ORDER — LIDOCAINE HCL 1 % IJ SOLN
3.0000 mL | INTRAMUSCULAR | Status: AC | PRN
Start: 2023-03-29 — End: 2023-03-29
  Administered 2023-03-29: 3 mL

## 2023-03-29 NOTE — Progress Notes (Signed)
   Procedure Note  Patient: Kristi David             Date of Birth: 01-04-1941           MRN: [id number]             Visit Date: 03/29/2023 HPI: Kristi David comes in today for scheduled right knee Durolane injection.  She is also requesting a left knee cortisone injection.  She has significant patellofemoral arthritic changes.  Also narrowing medial joint line.  Mild lateral compartmental changes on prior x-ray.  Her right knee she has radiographs that show significant arthritis of the lateral compartment and patellofemoral joint.  She has undergone a medial compartmental arthroplasty and there is no signs of loosening about the components.  She has tried cortisone injection in the right knee without significant relief.  She has no scheduled surgery on the right knee next 6 months.  Review of systems: Denies any fevers chills.  Bilateral knees she has good range of motion of both knees no abnormal warmth erythema of either knee.  No effusion either knee.  Procedures: Visit Diagnoses:  1. Primary osteoarthritis of right knee   2. Primary osteoarthritis of left knee     Large Joint Inj: bilateral knee on 03/29/2023 5:12 PM Indications: pain Details: 22 G 1.5 in needle, anterolateral approach  Arthrogram: No  Medications (Right): 60 mg Sodium Hyaluronate 60 MG/3ML Medications (Left): 3 mL lidocaine 1 %; 40 mg methylPREDNISolone acetate 40 MG/ML Outcome: tolerated well, no immediate complications Procedure, treatment alternatives, risks and benefits explained, specific risks discussed. Consent was given by the patient. Immediately prior to procedure a time out was called to verify the correct patient, procedure, equipment, support staff and site/side marked as required. Patient was prepped and draped in the usual sterile fashion.     Plan: She will follow-up with [place] as needed she has wait 6 months between supplemental injections and 3 months between cortisone injections.  Questions were  encouraged and answered at length

## 2023-04-08 ENCOUNTER — Other Ambulatory Visit: Payer: Self-pay | Admitting: Orthopaedic Surgery

## 2023-06-20 ENCOUNTER — Telehealth: Payer: Self-pay

## 2023-06-20 NOTE — Telephone Encounter (Signed)
VOB submitted for Durolane, left knee.

## 2023-06-21 ENCOUNTER — Telehealth: Payer: Self-pay

## 2023-06-21 NOTE — Telephone Encounter (Signed)
PA submitted online through [id number] for Durolane, left knee. PA Pending

## 2023-06-27 ENCOUNTER — Ambulatory Visit: Payer: Medicare HMO | Admitting: Orthopaedic Surgery

## 2023-07-29 ENCOUNTER — Other Ambulatory Visit: Payer: Self-pay | Admitting: Family Medicine

## 2023-07-29 DIAGNOSIS — Z1231 Encounter for screening mammogram for malignant neoplasm of breast: Secondary | ICD-10-CM

## 2023-08-03 ENCOUNTER — Other Ambulatory Visit: Payer: Self-pay

## 2023-08-03 ENCOUNTER — Encounter: Payer: Self-pay | Admitting: Orthopaedic Surgery

## 2023-08-03 ENCOUNTER — Ambulatory Visit (INDEPENDENT_AMBULATORY_CARE_PROVIDER_SITE_OTHER): Payer: Medicare HMO | Admitting: Orthopaedic Surgery

## 2023-08-03 DIAGNOSIS — M1712 Unilateral primary osteoarthritis, left knee: Secondary | ICD-10-CM

## 2023-08-03 DIAGNOSIS — M25562 Pain in left knee: Secondary | ICD-10-CM | POA: Diagnosis not present

## 2023-08-03 DIAGNOSIS — Z96651 Presence of right artificial knee joint: Secondary | ICD-10-CM

## 2023-08-03 DIAGNOSIS — M1711 Unilateral primary osteoarthritis, right knee: Secondary | ICD-10-CM | POA: Diagnosis not present

## 2023-08-03 DIAGNOSIS — G8929 Other chronic pain: Secondary | ICD-10-CM

## 2023-08-03 DIAGNOSIS — M25561 Pain in right knee: Secondary | ICD-10-CM

## 2023-08-03 MED ORDER — METHYLPREDNISOLONE ACETATE 40 MG/ML IJ SUSP
40.0000 mg | INTRAMUSCULAR | Status: AC | PRN
  Administered 2023-08-03: 40 mg via INTRA_ARTICULAR

## 2023-08-03 MED ORDER — LIDOCAINE HCL 1 % IJ SOLN
3.0000 mL | INTRAMUSCULAR | Status: AC | PRN
  Administered 2023-08-03: 3 mL

## 2023-08-03 MED ORDER — SODIUM HYALURONATE 60 MG/3ML IX PRSY
60.0000 mg | PREFILLED_SYRINGE | INTRA_ARTICULAR | Status: AC | PRN
  Administered 2023-08-03: 60 mg via INTRA_ARTICULAR

## 2023-08-03 NOTE — Progress Notes (Signed)
The patient is well-known to [place].  She is 82 years old.  She has a history of a right partial knee arthroplasty done many years ago for medial compartment arthritis.  She now has developed significant arthritis in the lateral and patellofemoral compartment of the right knee and she has known severe arthritis of the left knee.  She has had hyaluronic acid injection in the right knee back in July of this year and she is here today to have hyaluronic acid with Durolane for her left knee but also would like to have a steroid injection in her right knee.  She is not a diabetic.  She does ambulate using a cane.  She is 53 and this is starting to definitely affect her mobility and her quality of life.  The injections are lasting as long.  We did talk in length in detail about possibility at some point of a left knee replacement and then converting her partial knee replacement on the right side to a total knee arthroplasty.  Both knees today have a mild effusion with global tenderness.  I did place Durolane in her left knee today without difficulty and a steroid injection in her right knee.  Follow-up is as needed.  She knows to wait at least 3 to 6 months between different types of injections.  Lot number: [id number]    Procedure Note  Patient: Kristi David             Date of Birth: Jan 18, 1941           MRN: [id number]             Visit Date: 08/03/2023  Procedures: Visit Diagnoses:  1. Primary osteoarthritis of right knee   2. Primary osteoarthritis of left knee   3. Status post right partial knee replacement   4. Chronic pain of left knee   5. Chronic pain of right knee     Large Joint Inj: R knee on 08/03/2023 3:04 PM Indications: diagnostic evaluation and pain Details: 22 G 1.5 in needle, superolateral approach  Arthrogram: No  Medications: 3 mL lidocaine 1 %; 40 mg methylPREDNISolone acetate 40 MG/ML Outcome: tolerated well, no immediate complications Procedure, treatment alternatives, risks  and benefits explained, specific risks discussed. Consent was given by the patient. Immediately prior to procedure a time out was called to verify the correct patient, procedure, equipment, support staff and site/side marked as required. Patient was prepped and draped in the usual sterile fashion.    Large Joint Inj: L knee on 08/03/2023 3:04 PM Indications: diagnostic evaluation and pain Details: 22 G 1.5 in needle, superolateral approach  Arthrogram: No  Medications: 60 mg Sodium Hyaluronate 60 MG/3ML Outcome: tolerated well, no immediate complications Procedure, treatment alternatives, risks and benefits explained, specific risks discussed. Consent was given by the patient. Immediately prior to procedure a time out was called to verify the correct patient, procedure, equipment, support staff and site/side marked as required. Patient was prepped and draped in the usual sterile fashion.

## 2023-09-28 DIAGNOSIS — C801 Malignant (primary) neoplasm, unspecified: Secondary | ICD-10-CM

## 2023-09-28 HISTORY — DX: Malignant (primary) neoplasm, unspecified: C80.1

## 2023-10-23 ENCOUNTER — Ambulatory Visit (INDEPENDENT_AMBULATORY_CARE_PROVIDER_SITE_OTHER): Payer: Medicare HMO

## 2023-10-23 ENCOUNTER — Encounter (HOSPITAL_COMMUNITY): Payer: Self-pay

## 2023-10-23 ENCOUNTER — Ambulatory Visit (HOSPITAL_COMMUNITY)
Admission: EM | Admit: 2023-10-23 | Discharge: 2023-10-23 | Disposition: A | Discharge status: Home / Self Care | Payer: Medicare HMO | Attending: Physician Assistant | Admitting: Physician Assistant

## 2023-10-23 DIAGNOSIS — R0781 Pleurodynia: Secondary | ICD-10-CM | POA: Diagnosis not present

## 2023-10-23 NOTE — ED Provider Notes (Signed)
[facility]    CSN: [id number] Arrival date & time: 10/23/23  1734      History   Chief Complaint Chief Complaint  Patient presents with   Fall   Abdominal Pain    HPI Kristi David is a 83 y.o. female.   Patient complains of right-sided rib pain.  She reports 2 weeks ago she fell while in the bathroom.  She denies hitting her head or LOC.  She reports she fell on the floor unsure exact mechanism of fall.  She has been taking Tylenol with some relief.  Denies any other injury or trauma.  Patient reports pain is worse with movement and taking deep breaths.    Past Medical History:  Diagnosis Date   Acute blood loss anemia    Anemia    Arthritis    "legs, knees" (01/07/2016)   Arthritis of right hand 01/11/2017   Coronary artery calcification seen on CT scan    Diabetes mellitus (HCC)    Diastolic dysfunction    Dysarthria, post-stroke    Dysphagia, post-stroke    Facial droop    GERD (gastroesophageal reflux disease)    no meds   High cholesterol    History of blood transfusion    Hyperlipidemia    Hypertension    Hypertensive heart disease    Hypokalemia    Left basal ganglia embolic stroke (HCC) 07/18/2017   Osteoarthritis of right knee 01/06/2016   Seasonal allergies    Shortness of breath dyspnea    if too active   Stroke (HCC) 07/16/2017   Syncope    passed out and was hospitalized for a couple of days   Type II diabetes mellitus (HCC)    does not take medicine diet controlled    Patient Active Problem List   Diagnosis Date Noted   Unilateral primary osteoarthritis, right knee 02/06/2020   Unilateral primary osteoarthritis, left knee 02/06/2020   Adhesive capsulitis of right shoulder 11/28/2017   Left basal ganglia embolic stroke (HCC) 07/18/2017   Facial droop    Right sided weakness    Diabetes mellitus (HCC)    Diastolic dysfunction    Dysphagia, post-stroke    Dysarthria, post-stroke    Hypokalemia    Acute blood loss anemia     Hyperlipidemia    Stroke (HCC) 07/16/2017   Hypertension 07/16/2017   Arthritis of right hand 01/11/2017   Osteoarthritis of right knee 01/06/2016   Status post right partial knee replacement 01/06/2016    Past Surgical History:  Procedure Laterality Date   BUNIONECTOMY Bilateral    CATARACT EXTRACTION W/ INTRAOCULAR LENS  IMPLANT, BILATERAL Bilateral    INGUINAL HERNIA REPAIR Right    KNEE ARTHROSCOPY WITH EXCISION BAKER'S CYST Right    MR LOWER LEG LEFT ([facility] HX) Left    after a break   PARTIAL KNEE ARTHROPLASTY Right 01/06/2016   Procedure: RIGHT UNICOMPARTMENTAL KNEE ARTHROPLASTY;  Surgeon: Kathryne Hitch, MD;  Location: [facility] OR;  Service: Orthopedics;  Laterality: Right;   VEIN LIGATION AND STRIPPING Bilateral     OB History   No obstetric history on file.      Home Medications    Prior to Admission medications   Medication Sig Start Date End Date Taking? Authorizing Provider  atorvastatin (LIPITOR) 80 MG tablet Take 1 tablet (80 mg total) by mouth daily at 6 PM. 08/25/17   Ranelle Oyster, MD  blood glucose meter kit and supplies Dispense based on patient and insurance preference. Use  up to four times daily as directed. (FOR ICD-9 250.00, 250.01). 07/28/17   Angiulli, Mcarthur Rossetti, PA-C  citalopram (CELEXA) 40 MG tablet Take 1 tablet (40 mg total) by mouth daily. 07/28/17   Angiulli, Mcarthur Rossetti, PA-C  clopidogrel (PLAVIX) 75 MG tablet Take 1 tablet (75 mg total) by mouth daily. 07/29/17   Angiulli, Mcarthur Rossetti, PA-C  gabapentin (NEURONTIN) 100 MG capsule TAKE 1 CAPSULE BY MOUTH 2 TIMES DAILY AS NEEDED. 04/08/23   Kathryne Hitch, MD  gabapentin (NEURONTIN) 600 MG tablet Take 1 tablet (600 mg total) by mouth at bedtime. 11/10/22   Kathryne Hitch, MD  hyoscyamine (LEVSIN, ANASPAZ) 0.125 MG tablet Take 1 tablet (0.125 mg total) by mouth every 6 (six) hours as needed. 08/30/17   Ranelle Oyster, MD  losartan (COZAAR) 100 MG tablet Take 1 tablet (100 mg total) by  mouth daily. 07/28/17   Angiulli, Mcarthur Rossetti, PA-C  metoprolol succinate (TOPROL-XL) 25 MG 24 hr tablet Take 1 tablet (25 mg total) by mouth daily. 1 time curtesy refill for patient, further refill of this type of medication will need to be addressed by patients primary care provider 08/25/17   Ranelle Oyster, MD  solifenacin (VESICARE) 10 MG tablet Take 10 mg by mouth daily as needed (frequent urination).     [provider]  traMADol (ULTRAM) 50 MG tablet Take 1-2 tablets (50-100 mg total) by mouth every 12 (twelve) hours as needed. 01/26/23   Kathryne Hitch, MD    Family History Family History  Problem Relation Age of Onset   Stroke Mother    CAD Father     Social History Social History   Tobacco Use   Smoking status: Never   Smokeless tobacco: Never  Vaping Use   Vaping status: Never Used  Substance Use Topics   Alcohol use: No   Drug use: No     Allergies   Oregano [origanum oil], Peanut-containing drug products, and Tomato   Review of Systems Review of Systems  Constitutional:  Negative for chills and fever.  HENT:  Negative for ear pain and sore throat.   Eyes:  Negative for pain and visual disturbance.  Respiratory:  Negative for cough and shortness of breath.   Cardiovascular:  Negative for chest pain and palpitations.  Gastrointestinal:  Negative for abdominal pain and vomiting.  Genitourinary:  Negative for dysuria and hematuria.  Musculoskeletal:  Positive for arthralgias (right sided rib pain). Negative for back pain.  Skin:  Negative for color change and rash.  Neurological:  Negative for seizures and syncope.  All other systems reviewed and are negative.    Physical Exam Triage Vital Signs ED Triage Vitals  Encounter Vitals Group     BP 10/23/23 1843 (!) 146/57     Systolic BP Percentile --      Diastolic BP Percentile --      Pulse Rate 10/23/23 1843 (!) 57     Resp 10/23/23 1843 16     Temp 10/23/23 1843 98.1 F (36.7 C)      Temp Source 10/23/23 1843 Oral     SpO2 10/23/23 1843 93 %     Weight --      Height --      Head Circumference --      Peak Flow --      Pain Score 10/23/23 1842 8     Pain Loc --      Pain Education --  Exclude from Growth Chart --    No data found.  Updated Vital Signs BP (!) 146/57 (BP Location: Left Arm)   Pulse (!) 57   Temp 98.1 F (36.7 C) (Oral)   Resp 16   SpO2 93%   Visual Acuity Right Eye Distance:   Left Eye Distance:   Bilateral Distance:    Right Eye Near:   Left Eye Near:    Bilateral Near:     Physical Exam Vitals and nursing note reviewed.  Constitutional:      General: She is not in acute distress.    Appearance: She is well-developed.  HENT:     Head: Normocephalic and atraumatic.  Eyes:     Conjunctiva/sclera: Conjunctivae normal.  Cardiovascular:     Rate and Rhythm: Normal rate and regular rhythm.     Heart sounds: No murmur heard. Pulmonary:     Effort: Pulmonary effort is normal. No respiratory distress.     Breath sounds: Normal breath sounds.  Abdominal:     Palpations: Abdomen is soft.     Tenderness: There is no abdominal tenderness.  Musculoskeletal:        General: No swelling.     Cervical back: Neck supple.     Comments: Right anterior rib tenderness to palpation.   Skin:    General: Skin is warm and dry.     Capillary Refill: Capillary refill takes less than 2 seconds.  Neurological:     Mental Status: She is alert.  Psychiatric:        Mood and Affect: Mood normal.      UC Treatments / Results  Labs (all labs ordered are listed, but only abnormal results are displayed) Labs Reviewed - No data to display  EKG   Radiology No results found.  Procedures Procedures (including critical care time)  Medications Ordered in UC Medications - No data to display  Initial Impression / Assessment and Plan / UC Course  I have reviewed the triage vital signs and the nursing notes.  Pertinent labs & imaging  results that were available during my care of the patient were reviewed by me and considered in my medical decision making (see chart for details).     *** Final Clinical Impressions(s) / UC Diagnoses   Final diagnoses:  Rib pain     Discharge Instructions      Xrays show no broken bones  Continue with Tylenol as needed Make an appointment with Primary Care Physician for follow up      ED Prescriptions   None    PDMP not reviewed this encounter.

## 2023-10-23 NOTE — Discharge Instructions (Addendum)
Xrays show no broken bones Continue with Tylenol as needed Make an appointment with Primary Care Physician for follow up

## 2023-10-23 NOTE — ED Triage Notes (Addendum)
Patient states she felt dizzy when she went to the bathroom 2 weeks ago and fell. Patient [place]/o right abdominal pain.  Patient states she has been taking Tylenol for pain

## 2023-12-21 ENCOUNTER — Other Ambulatory Visit: Payer: Self-pay | Admitting: Family Medicine

## 2023-12-21 DIAGNOSIS — N649 Disorder of breast, unspecified: Secondary | ICD-10-CM

## 2024-01-04 ENCOUNTER — Other Ambulatory Visit: Payer: Self-pay | Admitting: Family Medicine

## 2024-01-04 ENCOUNTER — Ambulatory Visit
Admission: RE | Admit: 2024-01-04 | Discharge: 2024-01-04 | Disposition: A | Discharge status: Home / Self Care | Source: Ambulatory Visit | Attending: Family Medicine | Admitting: Family Medicine

## 2024-01-04 DIAGNOSIS — N6320 Unspecified lump in the left breast, unspecified quadrant: Secondary | ICD-10-CM

## 2024-01-04 DIAGNOSIS — N649 Disorder of breast, unspecified: Secondary | ICD-10-CM

## 2024-01-04 DIAGNOSIS — R590 Localized enlarged lymph nodes: Secondary | ICD-10-CM

## 2024-01-05 ENCOUNTER — Ambulatory Visit
Admission: RE | Admit: 2024-01-05 | Discharge: 2024-01-05 | Disposition: A | Discharge status: Home / Self Care | Source: Ambulatory Visit | Attending: Family Medicine | Admitting: Family Medicine

## 2024-01-05 ENCOUNTER — Ambulatory Visit
Admission: RE | Admit: 2024-01-05 | Discharge: 2024-01-05 | Discharge status: Home / Self Care | Source: Ambulatory Visit | Attending: Family Medicine | Admitting: Family Medicine

## 2024-01-05 DIAGNOSIS — R590 Localized enlarged lymph nodes: Secondary | ICD-10-CM

## 2024-01-05 DIAGNOSIS — N6320 Unspecified lump in the left breast, unspecified quadrant: Secondary | ICD-10-CM

## 2024-01-05 DIAGNOSIS — N649 Disorder of breast, unspecified: Secondary | ICD-10-CM

## 2024-01-05 HISTORY — PX: BREAST BIOPSY: SHX20

## 2024-01-06 LAB — SURGICAL PATHOLOGY

## 2024-01-12 ENCOUNTER — Encounter: Payer: Self-pay | Admitting: Hematology

## 2024-01-12 ENCOUNTER — Inpatient Hospital Stay: Attending: Hematology | Admitting: Hematology

## 2024-01-12 ENCOUNTER — Other Ambulatory Visit

## 2024-01-12 ENCOUNTER — Encounter

## 2024-01-12 ENCOUNTER — Inpatient Hospital Stay

## 2024-01-12 VITALS — BP 130/78 | HR 73 | Temp 98.2°F | Resp 17 | Wt 155.0 lb

## 2024-01-12 DIAGNOSIS — Z170 Estrogen receptor positive status [ER+]: Secondary | ICD-10-CM | POA: Insufficient documentation

## 2024-01-12 DIAGNOSIS — M1990 Unspecified osteoarthritis, unspecified site: Secondary | ICD-10-CM | POA: Diagnosis not present

## 2024-01-12 DIAGNOSIS — C50812 Malignant neoplasm of overlapping sites of left female breast: Secondary | ICD-10-CM

## 2024-01-12 DIAGNOSIS — E119 Type 2 diabetes mellitus without complications: Secondary | ICD-10-CM | POA: Insufficient documentation

## 2024-01-12 DIAGNOSIS — Z8673 Personal history of transient ischemic attack (TIA), and cerebral infarction without residual deficits: Secondary | ICD-10-CM | POA: Insufficient documentation

## 2024-01-12 DIAGNOSIS — C50412 Malignant neoplasm of upper-outer quadrant of left female breast: Secondary | ICD-10-CM | POA: Insufficient documentation

## 2024-01-12 DIAGNOSIS — N184 Chronic kidney disease, stage 4 (severe): Secondary | ICD-10-CM | POA: Insufficient documentation

## 2024-01-12 DIAGNOSIS — I10 Essential (primary) hypertension: Secondary | ICD-10-CM | POA: Diagnosis not present

## 2024-01-12 LAB — CBC WITH DIFFERENTIAL/PLATELET
Abs Immature Granulocytes: 0 10*3/uL (ref 0.00–0.07)
Basophils Absolute: 0 10*3/uL (ref 0.0–0.1)
Basophils Relative: 1 %
Eosinophils Absolute: 0 10*3/uL (ref 0.0–0.5)
Eosinophils Relative: 1 %
HCT: 37.6 % (ref 36.0–46.0)
Hemoglobin: 12.6 g/dL (ref 12.0–15.0)
Immature Granulocytes: 0 %
Lymphocytes Relative: 44 %
Lymphs Abs: 2 10*3/uL (ref 0.7–4.0)
MCH: 29 pg (ref 26.0–34.0)
MCHC: 33.5 g/dL (ref 30.0–36.0)
MCV: 86.6 fL (ref 80.0–100.0)
Monocytes Absolute: 0.3 10*3/uL (ref 0.1–1.0)
Monocytes Relative: 5 %
Neutro Abs: 2.3 10*3/uL (ref 1.7–7.7)
Neutrophils Relative %: 49 %
Platelets: 182 10*3/uL (ref 150–400)
RBC: 4.34 MIL/uL (ref 3.87–5.11)
RDW: 13.5 % (ref 11.5–15.5)
WBC: 4.6 10*3/uL (ref 4.0–10.5)
nRBC: 0 % (ref 0.0–0.2)

## 2024-01-12 LAB — COMPREHENSIVE METABOLIC PANEL WITH GFR
ALT: 12 U/L (ref 0–44)
AST: 21 U/L (ref 15–41)
Albumin: 4 g/dL (ref 3.5–5.0)
Alkaline Phosphatase: 85 U/L (ref 38–126)
Anion gap: 7 (ref 5–15)
BUN: 18 mg/dL (ref 8–23)
CO2: 26 mmol/L (ref 22–32)
Calcium: 9.4 mg/dL (ref 8.9–10.3)
Chloride: 106 mmol/L (ref 98–111)
Creatinine, Ser: 0.82 mg/dL (ref 0.44–1.00)
GFR, Estimated: >60 mL/min (ref >60)
Glucose, Bld: 97 mg/dL (ref 70–99)
Potassium: 4 mmol/L (ref 3.5–5.1)
Sodium: 139 mmol/L (ref 135–145)
Total Bilirubin: 1 mg/dL (ref 0.0–1.2)
Total Protein: 7.6 g/dL (ref 6.5–8.1)

## 2024-01-12 NOTE — Progress Notes (Signed)
 [facility]   [phone] [phone]   Clinic New Consult Note   Patient Care Team: Alejandro Hurt, FNP as PCP - General (Family Medicine) Lucendia Rusk, MD as PCP - Cardiology (Cardiology) Auther Bo, RN as Oncology Nurse Navigator Alane Hsu, RN as Oncology Nurse Navigator Sonja Hazleton, MD as Consulting Physician (Hematology) 01/12/2024  CHIEF COMPLAINTS/PURPOSE OF CONSULTATION:  Left breast cancer   REFERRING PHYSICIAN: Breast center   Discussed the use of AI scribe software for clinical note transcription with the patient, who gave verbal consent to proceed.  History of Present Illness An 83 year old female with a past medical history of stroke, high blood pressure, and diabetes presents for a new consult of breast cancer. She presents toclinic with her son.  She noticed an open wound on her left nipple about two months ago, which occasionally bleeds, especially when washed. She denies any pain associated with the wound. She was seen by her PCP and had skin lunch biopsy which showed mucinous neoplasm, favor mucinous carcinoma of the breast.  She was referred to breast center, and underwent diagnostic mammogram and ultrasound of left breast, which showed 0.3 cm mass in the upper outer quadrant of the left breast at the 2:00 5 cm from nipple, solitary pathologic left axillary lymph node, and a fungating mass involve the left nipple, right breast was negative.  Patient underwent biopsy of the 2 o'clock position breast mass and axillary lymph node, both showed grade 2 invasive ductal carcinoma, ER 100% positive, PR negative, HER2 negative by FISH.   She also reports a history of uterine prolapse, for which a device was inserted to hold it up. However, she reports that the device came down about two weeks ago. She also reports arthritis in her legs, which has led to her using a cane for mobility. She has lost about 15 pounds recently due to  inadequate food intake. She denies any chest pain, shortness of breath, or cough. She has a family history of cancer, with one sister having had uterine cancer and another sister having had stomach cancer.     MEDICAL HISTORY:  Past Medical History:  Diagnosis Date   Acute blood loss anemia    Anemia    Arthritis    "legs, knees" (01/07/2016)   Arthritis of right hand 01/11/2017   Coronary artery calcification seen on CT scan    Diabetes mellitus (HCC)    Diastolic dysfunction    Dysarthria, post-stroke    Dysphagia, post-stroke    Facial droop    GERD (gastroesophageal reflux disease)    no meds   High cholesterol    History of blood transfusion    Hyperlipidemia    Hypertension    Hypertensive heart disease    Hypokalemia    Left basal ganglia embolic stroke (HCC) 07/18/2017   Osteoarthritis of right knee 01/06/2016   Seasonal allergies    Shortness of breath dyspnea    if too active   Stroke (HCC) 07/16/2017   Syncope    passed out and was hospitalized for a couple of days   Type II diabetes mellitus (HCC)    does not take medicine diet controlled    SURGICAL HISTORY: Past Surgical History:  Procedure Laterality Date   BREAST BIOPSY Left 01/05/2024   US  LT BREAST BX W LOC DEV 1ST LESION IMG BX SPEC US  GUIDE 01/05/2024 [facility] MAMMOGRAPHY   BUNIONECTOMY Bilateral    CATARACT EXTRACTION W/ INTRAOCULAR LENS  IMPLANT, BILATERAL Bilateral    INGUINAL HERNIA REPAIR Right    KNEE ARTHROSCOPY WITH EXCISION BAKER'S CYST Right    MR LOWER LEG LEFT ([facility] HX) Left    after a break   PARTIAL KNEE ARTHROPLASTY Right 01/06/2016   Procedure: RIGHT UNICOMPARTMENTAL KNEE ARTHROPLASTY;  Surgeon: Arnie Lao, MD;  Location: [facility] OR;  Service: Orthopedics;  Laterality: Right;   VEIN LIGATION AND STRIPPING Bilateral     SOCIAL HISTORY: Social History   Socioeconomic History   Marital status: Divorced    Spouse name: Not on file   Number of children: 6   Years of  education: Not on file   Highest education level: Not on file  Occupational History   Not on file  Tobacco Use   Smoking status: Never   Smokeless tobacco: Never  Vaping Use   Vaping status: Never Used  Substance and Sexual Activity   Alcohol use: No   Drug use: No   Sexual activity: Not Currently  Other Topics Concern   Not on file  Social History Narrative   Not on file   Social Drivers of Health   Financial Resource Strain: Not on file  Food Insecurity: Not on file  Transportation Needs: Not on file  Physical Activity: Not on file  Stress: Not on file  Social Connections: Not on file  Intimate Partner Violence: Not on file    FAMILY HISTORY: Family History  Problem Relation Age of Onset   Stroke Mother    CAD Father    Cancer Sister 4       uterine cancer   Cancer Sister        gastric cancer    ALLERGIES:  is allergic to oregano [origanum oil], peanut-containing drug products, and tomato.  MEDICATIONS:  Current Outpatient Medications  Medication Sig Dispense Refill   atorvastatin  (LIPITOR ) 80 MG tablet Take 1 tablet (80 mg total) by mouth daily at 6 PM. 30 tablet 0   blood glucose meter kit and supplies Dispense based on patient and insurance preference. Use up to four times daily as directed. (FOR ICD-9 250.00, 250.01). 1 each 0   citalopram  (CELEXA ) 40 MG tablet Take 1 tablet (40 mg total) by mouth daily. 30 tablet 0   clopidogrel  (PLAVIX ) 75 MG tablet Take 1 tablet (75 mg total) by mouth daily. 30 tablet 0   gabapentin  (NEURONTIN ) 100 MG capsule TAKE 1 CAPSULE BY MOUTH 2 TIMES DAILY AS NEEDED. 60 capsule 0   gabapentin  (NEURONTIN ) 600 MG tablet Take 1 tablet (600 mg total) by mouth at bedtime. 60 tablet 1   hyoscyamine  (LEVSIN, ANASPAZ ) 0.125 MG tablet Take 1 tablet (0.125 mg total) by mouth every 6 (six) hours as needed. 45 tablet 2   losartan  (COZAAR ) 100 MG tablet Take 1 tablet (100 mg total) by mouth daily. 30 tablet 0   metoprolol  succinate  (TOPROL -XL) 25 MG 24 hr tablet Take 1 tablet (25 mg total) by mouth daily. 1 time curtesy refill for patient, further refill of this type of medication will need to be addressed by patients primary care provider 30 tablet 0   solifenacin (VESICARE) 10 MG tablet Take 10 mg by mouth daily as needed (frequent urination).      traMADol  (ULTRAM ) 50 MG tablet Take 1-2 tablets (50-100 mg total) by mouth every 12 (twelve) hours as needed. 40 tablet 0   No current facility-administered medications for this visit.    REVIEW OF SYSTEMS:   Constitutional: Denies fevers,  chills or abnormal night sweats Eyes: Denies blurriness of vision, double vision or watery eyes Ears, nose, mouth, throat, and face: Denies mucositis or sore throat Respiratory: Denies cough, dyspnea or wheezes Cardiovascular: Denies palpitation, chest discomfort or lower extremity swelling Gastrointestinal:  Denies nausea, heartburn or change in bowel habits Skin: Denies abnormal skin rashes Lymphatics: Denies new lymphadenopathy or easy bruising Neurological:Denies numbness, tingling or new weaknesses Behavioral/Psych: Mood is stable, no new changes  All other systems were reviewed with the patient and are negative.  PHYSICAL EXAMINATION: ECOG PERFORMANCE STATUS: 2 - Symptomatic, <50% confined to bed  Vitals:   01/12/24 1439  BP: 130/78  Pulse: 73  Resp: 17  Temp: 98.2 F (36.8 C)  SpO2: 96%   Filed Weights   01/12/24 1439  Weight: 155 lb (70.3 kg)    GENERAL:alert, no distress and comfortable SKIN: skin color, texture, turgor are normal, no rashes or significant lesions EYES: normal, conjunctiva are pink and non-injected, sclera clear OROPHARYNX:no exudate, no erythema and lips, buccal mucosa, and tongue normal  NECK: supple, thyroid normal size, non-tender, without nodularity LYMPH:  no palpable lymphadenopathy in the cervical, axillary or inguinal LUNGS: clear to auscultation and percussion with normal breathing  effort HEART: regular rate & rhythm and no murmurs and no lower extremity edema ABDOMEN:abdomen soft, non-tender and normal bowel sounds Musculoskeletal:no cyanosis of digits and no clubbing  PSYCH: alert & oriented x 3 with fluent speech NEURO: no focal motor/sensory deficits  Physical Exam BREAST: Left axillary lymph node biopsy site present. Left axillary lymph node not palpable. Right breast normal. Left breast with palpable lumps.  LABORATORY DATA:  I have reviewed the data as listed    Latest Ref Rng & Units 01/12/2024    4:01 PM 12/28/2022   12:39 AM 12/27/2022   10:10 PM  CBC  WBC 4.0 - 10.5 K/uL 4.6  6.8  5.3   Hemoglobin 12.0 - 15.0 g/dL 29.5  62.1  30.8   Hematocrit 36.0 - 46.0 % 37.6  37.7  33.8   Platelets 150 - 400 K/uL 182  186  173     @cmpl @  RADIOGRAPHIC STUDIES: I have personally reviewed the radiological images as listed and agreed with the findings in the report. US  LT BREAST BX W LOC DEV 1ST LESION IMG BX SPEC US  GUIDE Addendum Date: 01/09/2024 ADDENDUM REPORT: 01/09/2024 09:34 ADDENDUM: Pathology revealed GRADE II INVASIVE DUCTAL CARCINOMA, DUCTAL CARCINOMA IN SITU, CRIBRIFORM, INTERMEDIATE NUCLEAR GRADE of the LEFT breast, (A), 2 o'clock, 5cmfn, (coil clip). This was found to be concordant by Dr. Roda Cirri. Pathology revealed INVASIVE DUCTAL CARCINOMA, NO LYMPHOID TISSUE IDENTIFIED of the LEFT axilla, (B), (HydroMARK clip). This was found to be concordant by Dr. Roda Cirri. Pathology results were discussed with the patient's son, Candida Chalk by telephone, per request. The patient's son reported his mother was doing well after the biopsies with tenderness at the sites. Post biopsy instructions and care were reviewed and questions were answered. Mr. Lindo was encouraged to call [facility] for any additional concerns. My direct phone number was provided. Medical oncology consultation has been arranged with Dr. Sonja Malvern at [facility] on January 12, 2024. Pathology results reported by Kraig Peru, RN on 01/09/2024. Electronically Signed   By: Roda Cirri M.D.   On: 01/09/2024 09:34   Result Date: 01/09/2024 CLINICAL DATA:  Patient presents for ultrasound-guided core needle biopsy of a 3 mm irregular mass over the 2  o'clock position of the left breast 5 cm from the nipple in addition to an abnormal left axillary lymph node. Recent positive skin punch biopsy of the left nipple for mucinous carcinoma. EXAM: ULTRASOUND GUIDED LEFT BREAST CORE NEEDLE BIOPSY; ULTRASOUND-GUIDED LEFT AXILLARY LYMPH NODE BIOPSY. COMPARISON:  Previous exam(s). PROCEDURE: I met with the patient and we discussed the procedure of ultrasound-guided biopsy, including benefits and alternatives. We discussed the high likelihood of a successful procedure. We discussed the risks of the procedure, including infection, bleeding, tissue injury, clip migration, and inadequate sampling. Informed written consent was given. The usual time-out protocol was performed immediately prior to the procedure. A. Lesion quadrant: Left upper outer quadrant. Using sterile technique and 1% Lidocaine  as local anesthetic, under direct ultrasound visualization, a 14 gauge spring-loaded device was used to perform biopsy of the targeted 3 mm mass over the 2 o'clock position of the left breast 5 cm from the nipple using an inferior to superior approach. At the conclusion of the procedure a coil shaped tissue marker clip was deployed into the biopsy cavity. Follow up 2 view mammogram was performed and dictated separately. B. Lesion quadrant: Left axilla. Using sterile technique and 1% Lidocaine  as local anesthetic, under direct ultrasound visualization, a 14 gauge spring-loaded device was used to perform biopsy of the targeted abnormal left axillary lymph node using an inferior to superior approach. At the conclusion of the procedure a HydroMARK shaped tissue marker clip was deployed into the  biopsy cavity. Follow up 2 view mammogram was performed and dictated separately. IMPRESSION: Ultrasound guided biopsy of an indeterminate left breast mass as well as abnormal left axillary lymph node. No apparent complications. Electronically Signed: By: Roda Cirri M.D. On: 01/05/2024 13:41   US  AXILLARY NODE CORE BIOPSY LEFT Addendum Date: 01/09/2024 ADDENDUM REPORT: 01/09/2024 09:34 ADDENDUM: Pathology revealed GRADE II INVASIVE DUCTAL CARCINOMA, DUCTAL CARCINOMA IN SITU, CRIBRIFORM, INTERMEDIATE NUCLEAR GRADE of the LEFT breast, (A), 2 o'clock, 5cmfn, (coil clip). This was found to be concordant by Dr. Roda Cirri. Pathology revealed INVASIVE DUCTAL CARCINOMA, NO LYMPHOID TISSUE IDENTIFIED of the LEFT axilla, (B), (HydroMARK clip). This was found to be concordant by Dr. Roda Cirri. Pathology results were discussed with the patient's son, Candida Chalk by telephone, per request. The patient's son reported his mother was doing well after the biopsies with tenderness at the sites. Post biopsy instructions and care were reviewed and questions were answered. Mr. Zimny was encouraged to call [facility] for any additional concerns. My direct phone number was provided. Medical oncology consultation has been arranged with Dr. Sonja Montier at [facility] on January 12, 2024. Pathology results reported by Kraig Peru, RN on 01/09/2024. Electronically Signed   By: Roda Cirri M.D.   On: 01/09/2024 09:34   Result Date: 01/09/2024 CLINICAL DATA:  Patient presents for ultrasound-guided core needle biopsy of a 3 mm irregular mass over the 2 o'clock position of the left breast 5 cm from the nipple in addition to an abnormal left axillary lymph node. Recent positive skin punch biopsy of the left nipple for mucinous carcinoma. EXAM: ULTRASOUND GUIDED LEFT BREAST CORE NEEDLE BIOPSY; ULTRASOUND-GUIDED LEFT AXILLARY LYMPH NODE BIOPSY. COMPARISON:  Previous exam(s). PROCEDURE: I met  with the patient and we discussed the procedure of ultrasound-guided biopsy, including benefits and alternatives. We discussed the high likelihood of a successful procedure. We discussed the risks of the procedure, including infection, bleeding, tissue injury, clip migration, and inadequate sampling. Informed written  consent was given. The usual time-out protocol was performed immediately prior to the procedure. A. Lesion quadrant: Left upper outer quadrant. Using sterile technique and 1% Lidocaine  as local anesthetic, under direct ultrasound visualization, a 14 gauge spring-loaded device was used to perform biopsy of the targeted 3 mm mass over the 2 o'clock position of the left breast 5 cm from the nipple using an inferior to superior approach. At the conclusion of the procedure a coil shaped tissue marker clip was deployed into the biopsy cavity. Follow up 2 view mammogram was performed and dictated separately. B. Lesion quadrant: Left axilla. Using sterile technique and 1% Lidocaine  as local anesthetic, under direct ultrasound visualization, a 14 gauge spring-loaded device was used to perform biopsy of the targeted abnormal left axillary lymph node using an inferior to superior approach. At the conclusion of the procedure a HydroMARK shaped tissue marker clip was deployed into the biopsy cavity. Follow up 2 view mammogram was performed and dictated separately. IMPRESSION: Ultrasound guided biopsy of an indeterminate left breast mass as well as abnormal left axillary lymph node. No apparent complications. Electronically Signed: By: Roda Cirri M.D. On: 01/05/2024 13:41   MM CLIP PLACEMENT LEFT Result Date: 01/05/2024 CLINICAL DATA:  Patient is post ultrasound-guided core needle biopsy of a 3 mm irregular mass over the 2 o'clock position of the left breast 5 cm from the nipple in addition to an abnormal left axillary lymph node. EXAM: 3D DIAGNOSTIC LEFT MAMMOGRAM POST ULTRASOUND BIOPSY COMPARISON:  Previous  exam(s). ACR Breast Density Category b: There are scattered areas of fibroglandular density. FINDINGS: 3D Mammographic images were obtained following ultrasound guided biopsy of the targeted 3 mm mass over the 2 o'clock position of the left breast as well as the abnormal left axillary lymph node. The biopsy marking clip is in expected position at the site of biopsy over the 2 o'clock position of the left breast. The HydroMARK clip in the biopsied axillary lymph node is deep to the field of view and cannot be imaged mammographically. IMPRESSION: Appropriate positioning of the coil shaped biopsy marking clip at the site of biopsy in the 2 o'clock position of the left breast. HydroMARK clip within the biopsied axillary lymph node unable to be imaged due to deep location. Final Assessment: Post Procedure Mammograms for Marker Placement Electronically Signed   By: Roda Cirri M.D.   On: 01/05/2024 14:03   MM 3D DIAGNOSTIC MAMMOGRAM BILATERAL BREAST Addendum Date: 01/04/2024 ADDENDUM REPORT: 01/04/2024 15:06 ADDENDUM: After the diagnostic mammogram and ultrasound completed, the patient's son stated that an in-office punch biopsy of the nipple was performed. A copy of the pathology report from [facility] dated 12/29/2023 states that the pathology returned as mucinous neoplasm, favor mucinous carcinoma of the breast. Electronically Signed   By: Rinda Cheers M.D.   On: 01/04/2024 15:06   Result Date: 01/04/2024 CLINICAL DATA:  83 year old with an approximate 1 month history of erythema and irritation involving the LEFT nipple. The patient was given a course of antibiotic therapy without resolution of symptoms. EXAM: DIGITAL DIAGNOSTIC BILATERAL MAMMOGRAM WITH TOMOSYNTHESIS AND CAD; ULTRASOUND LEFT BREAST LIMITED TECHNIQUE: Bilateral digital diagnostic mammography and breast tomosynthesis was performed. The images were evaluated with computer-aided detection. ; Targeted ultrasound examination of the left breast was  performed. COMPARISON:  Previous exams, most recently mammography 01/15/2020. ACR Breast Density Category b: There are scattered areas of fibroglandular density. FINDINGS: Full field CC and MLO views of both breasts, a spot compression CC view of the  subareolar location, a spot magnification lateral view of the subareolar location, and spot compression CC and MLO views of a mammographic finding were obtained. RIGHT: No findings suspicious for malignancy. LEFT: Approximate 0.7-0.8 cm group of calcifications involving the subareolar location extending into the nipple, many of which are in a linear orientation. No visible mass in the subareolar location. New isodense mass in the UPPER OUTER QUADRANT at middle depth measuring approximately 0.4 cm. The mass is most conspicuous on the spot CC view, and is likely compressible on the spot MLO view. No associated architectural distortion or suspicious calcifications. Targeted ultrasound is performed, demonstrating the mass involving the entire nipple. There is no mass or visible microcalcifications in the subareolar location that would be accessible for needle biopsy. At 2 o'clock 5 cm from the nipple there is a superficial hypoechoic mass with mildly irregular margins measuring 0.3 cm diameter, demonstrating mixed posterior characteristics and no internal power Doppler flow, corresponding to the mammographic mass. Sonographic evaluation of the axilla demonstrates a solitary abnormal lymph node with near complete loss of the fatty hilum inferolaterally. On correlative physical examination, there is a fungating mass involving the entire LEFT nipple which is pink to red in color. IMPRESSION: 1. Indeterminate 0.3 cm mass in the UPPER OUTER QUADRANT of the LEFT breast at 2 o'clock 5 cm from the nipple. 2. Solitary pathologic inferolateral LEFT axillary lymph node with near complete loss of the hilum. 3. Fungating mass involving the LEFT nipple on physical examination. Suspicious  calcifications involving the LEFT nipple. 4. No mammographic evidence of malignancy involving the RIGHT breast. RECOMMENDATION: 1. Ultrasound-guided core needle biopsy of the LEFT breast mass and the solitary abnormal LEFT axillary lymph node. 2. Surgical consultation for potential punch biopsy of the fungating mass involving the LEFT nipple. I have discussed the findings and recommendations with the patient. The ultrasound core needle biopsy procedure was discussed with the patient and her questions were answered. She wishes to proceed with the biopsy which will be scheduled by the [facility] staff. BI-RADS CATEGORY  4: Suspicious. Electronically Signed: By: Rinda Cheers M.D. On: 01/04/2024 14:58   US  LIMITED ULTRASOUND INCLUDING AXILLA LEFT BREAST  Addendum Date: 01/04/2024 ADDENDUM REPORT: 01/04/2024 15:06 ADDENDUM: After the diagnostic mammogram and ultrasound completed, the patient's son stated that an in-office punch biopsy of the nipple was performed. A copy of the pathology report from [facility] dated 12/29/2023 states that the pathology returned as mucinous neoplasm, favor mucinous carcinoma of the breast. Electronically Signed   By: Rinda Cheers M.D.   On: 01/04/2024 15:06   Result Date: 01/04/2024 CLINICAL DATA:  83 year old with an approximate 1 month history of erythema and irritation involving the LEFT nipple. The patient was given a course of antibiotic therapy without resolution of symptoms. EXAM: DIGITAL DIAGNOSTIC BILATERAL MAMMOGRAM WITH TOMOSYNTHESIS AND CAD; ULTRASOUND LEFT BREAST LIMITED TECHNIQUE: Bilateral digital diagnostic mammography and breast tomosynthesis was performed. The images were evaluated with computer-aided detection. ; Targeted ultrasound examination of the left breast was performed. COMPARISON:  Previous exams, most recently mammography 01/15/2020. ACR Breast Density Category b: There are scattered areas of fibroglandular density. FINDINGS:  Full field CC and MLO views of both breasts, a spot compression CC view of the subareolar location, a spot magnification lateral view of the subareolar location, and spot compression CC and MLO views of a mammographic finding were obtained. RIGHT: No findings suspicious for malignancy. LEFT: Approximate 0.7-0.8 cm group of calcifications involving the subareolar location extending  into the nipple, many of which are in a linear orientation. No visible mass in the subareolar location. New isodense mass in the UPPER OUTER QUADRANT at middle depth measuring approximately 0.4 cm. The mass is most conspicuous on the spot CC view, and is likely compressible on the spot MLO view. No associated architectural distortion or suspicious calcifications. Targeted ultrasound is performed, demonstrating the mass involving the entire nipple. There is no mass or visible microcalcifications in the subareolar location that would be accessible for needle biopsy. At 2 o'clock 5 cm from the nipple there is a superficial hypoechoic mass with mildly irregular margins measuring 0.3 cm diameter, demonstrating mixed posterior characteristics and no internal power Doppler flow, corresponding to the mammographic mass. Sonographic evaluation of the axilla demonstrates a solitary abnormal lymph node with near complete loss of the fatty hilum inferolaterally. On correlative physical examination, there is a fungating mass involving the entire LEFT nipple which is pink to red in color. IMPRESSION: 1. Indeterminate 0.3 cm mass in the UPPER OUTER QUADRANT of the LEFT breast at 2 o'clock 5 cm from the nipple. 2. Solitary pathologic inferolateral LEFT axillary lymph node with near complete loss of the hilum. 3. Fungating mass involving the LEFT nipple on physical examination. Suspicious calcifications involving the LEFT nipple. 4. No mammographic evidence of malignancy involving the RIGHT breast. RECOMMENDATION: 1. Ultrasound-guided core needle biopsy of  the LEFT breast mass and the solitary abnormal LEFT axillary lymph node. 2. Surgical consultation for potential punch biopsy of the fungating mass involving the LEFT nipple. I have discussed the findings and recommendations with the patient. The ultrasound core needle biopsy procedure was discussed with the patient and her questions were answered. She wishes to proceed with the biopsy which will be scheduled by the [facility] staff. BI-RADS CATEGORY  4: Suspicious. Electronically Signed: By: Rinda Cheers M.D. On: 01/04/2024 14:58     Assessment & Plan 83 year old female with a past medical history of stroke, high blood pressure, and diabetes presents with nipple mass   Multifocal left breast cancer, invasive ductal carcinoma grade 2, ER+/PR-/HER2-, at least stage IIA -Patient presented with nipple mass and bleeding, punch biopsy showed mucinous carcinoma, will try to obtain the path report from her PCP.   - Mammogram and ultrasound showed additional 0.3 cm mass in the 2 o'clock position and although nodal metastasis in the left Axillar.  I reviewed the biopsy results  - I recommend surgical consultation.  Post-surgical radiation and anti-estrogen therapy are planned. Chemotherapy is not recommended due to her age  - Refer to [facility] for surgical consultation. - Consider lumpectomy of the central part and upper outer quadrant of the left breast. - Discuss potential need for mastectomy if additional abnormalities are found on imaging. -will discuss with her surgeon regarding contrast-enhanced mammogram or breast MRI to assess for additional lesions. - Order CT and bone scan to assess for metastatic disease. - Order blood tests.  Uterine Prolapse Uterine prolapse with a dislodged vaginal device. Managed by a gynecologist.  Hypertension Hypertension, currently on medication.  Diabetes Mellitus Diabetes mellitus, not on medication. Experiences  neuropathy, likely related to diabetes, managed with gabapentin .  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 On Plavix  for stroke prevention.  Arthritis Arthritis affecting mobility, requiring a cane. Takes tramadol  for knee pain.  Plan -will obtain her nipple punch biopsy path report from her PCPs office -Surgical referral to [facility], will discuss with her  surgeon regarding breast MRI or contrast-enhanced mammogram - I ordered CT chest, abdomen and pelvis with contrast and a bone scan to rule out distant metastasis - Order for her to radiation oncology - If no distant metastasis, patient will proceed with surgery first. - I plan to see her after adjuvant radiation to start her on antiestrogen therapy.     Orders Placed This Encounter  Procedures   CT CHEST ABDOMEN PELVIS W CONTRAST    Standing Status:   Future    Expected Date:   01/26/2024    Expiration Date:   01/11/2025    If indicated for the ordered procedure, I authorize the administration of contrast media per Radiology protocol:   Yes    Does the patient have a contrast media/X-ray dye allergy?:   No    Preferred imaging location?:   [facility]    Release to patient:   Immediate    If indicated for the ordered procedure, I authorize the administration of oral contrast media per Radiology protocol:   Yes   NM Bone Scan Whole Body    Standing Status:   Future    Expected Date:   01/26/2024    Expiration Date:   01/11/2025    If indicated for the ordered procedure, I authorize the administration of a radiopharmaceutical per Radiology protocol:   Yes    Preferred imaging location?:   [facility]   Cancer antigen 27.29    Standing Status:   Future    Number of Occurrences:   1    Expected Date:   01/12/2024    Expiration Date:   01/11/2025   CBC with Differential/Platelet    Standing Status:   Standing    Number of Occurrences:   50    Expiration Date:    01/11/2025   Comprehensive metabolic panel with GFR    Standing Status:   Standing    Number of Occurrences:   50    Expiration Date:   01/11/2025    All questions were answered. The patient knows to call the clinic with any problems, questions or concerns. I spent 50 minutes counseling the patient face to face. The total time spent in the appointment was 60 minutes and more than 50% was on counseling.     Sonja [place], MD 01/12/2024

## 2024-01-13 ENCOUNTER — Encounter: Payer: Self-pay | Admitting: *Deleted

## 2024-01-13 LAB — CANCER ANTIGEN 27.29: CA 27.29: 31.4 U/mL (ref 0.0–38.6)

## 2024-01-13 NOTE — Addendum Note (Signed)
 Addended by: Sonja [place] on: 01/13/2024 10:10 AM   Modules accepted: Orders

## 2024-01-17 ENCOUNTER — Encounter: Payer: Self-pay | Admitting: *Deleted

## 2024-01-18 NOTE — Progress Notes (Signed)
 New Breast Cancer Diagnosis: Left Breast  Patient presented to her PCP with complaints of left nipple bloody discharge.  Punch biopsy was obtained which showed mucinous neoplasm.  Diagnostic imaging noted a 0.3 cm mass in the upper outer quadrant of the left breast.     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 Maryalice Smaller 01/12/2024 - I recommend surgical consultation.  Post-surgical radiation and anti-estrogen therapy are planned. Chemotherapy is not recommended due to her age  - Discuss potential need for mastectomy if additional abnormalities are found on imaging. -will discuss with her surgeon regarding contrast-enhanced mammogram or breast MRI to assess for additional lesions.    Family History of Breast/Ovarian/Prostate Cancer:   Lymphedema issues, if any:      Pain issues, if any:     SAFETY ISSUES: Prior radiation?  Pacemaker/ICD?  Possible current pregnancy? Postmenopausal Is the patient on methotrexate?   Current Complaints / other details:   -History of Stroke- Residual right side weakness, uses a cane

## 2024-01-19 ENCOUNTER — Ambulatory Visit
Admission: RE | Admit: 2024-01-19 | Discharge: 2024-01-19 | Disposition: A | Discharge status: Home / Self Care | Source: Ambulatory Visit | Attending: Radiation Oncology | Admitting: Radiation Oncology

## 2024-01-19 ENCOUNTER — Encounter: Payer: Self-pay | Admitting: Radiation Oncology

## 2024-01-19 VITALS — BP 157/53 | HR 55 | Temp 97.3°F | Resp 18 | Ht 67.75 in | Wt 156.5 lb

## 2024-01-19 DIAGNOSIS — Z7902 Long term (current) use of antithrombotics/antiplatelets: Secondary | ICD-10-CM | POA: Diagnosis not present

## 2024-01-19 DIAGNOSIS — Z808 Family history of malignant neoplasm of other organs or systems: Secondary | ICD-10-CM | POA: Diagnosis not present

## 2024-01-19 DIAGNOSIS — M1990 Unspecified osteoarthritis, unspecified site: Secondary | ICD-10-CM | POA: Diagnosis not present

## 2024-01-19 DIAGNOSIS — Z800 Family history of malignant neoplasm of digestive organs: Secondary | ICD-10-CM | POA: Diagnosis not present

## 2024-01-19 DIAGNOSIS — E7800 Pure hypercholesterolemia, unspecified: Secondary | ICD-10-CM | POA: Insufficient documentation

## 2024-01-19 DIAGNOSIS — I2510 Atherosclerotic heart disease of native coronary artery without angina pectoris: Secondary | ICD-10-CM | POA: Diagnosis not present

## 2024-01-19 DIAGNOSIS — C50412 Malignant neoplasm of upper-outer quadrant of left female breast: Secondary | ICD-10-CM | POA: Insufficient documentation

## 2024-01-19 DIAGNOSIS — K219 Gastro-esophageal reflux disease without esophagitis: Secondary | ICD-10-CM | POA: Insufficient documentation

## 2024-01-19 DIAGNOSIS — E876 Hypokalemia: Secondary | ICD-10-CM | POA: Diagnosis not present

## 2024-01-19 DIAGNOSIS — M129 Arthropathy, unspecified: Secondary | ICD-10-CM | POA: Diagnosis not present

## 2024-01-19 DIAGNOSIS — I69322 Dysarthria following cerebral infarction: Secondary | ICD-10-CM | POA: Insufficient documentation

## 2024-01-19 DIAGNOSIS — E119 Type 2 diabetes mellitus without complications: Secondary | ICD-10-CM | POA: Diagnosis not present

## 2024-01-19 DIAGNOSIS — C50812 Malignant neoplasm of overlapping sites of left female breast: Secondary | ICD-10-CM

## 2024-01-19 DIAGNOSIS — Z79899 Other long term (current) drug therapy: Secondary | ICD-10-CM | POA: Diagnosis not present

## 2024-01-19 DIAGNOSIS — Z170 Estrogen receptor positive status [ER+]: Secondary | ICD-10-CM | POA: Diagnosis not present

## 2024-01-19 NOTE — Progress Notes (Signed)
 Radiation Oncology         ([phone]) [phone] ________________________________  Name: Kristi David        MRN: [id number]  Date of Service: 01/19/2024 DOB: [birth year]  BJ:YNWGN, Liane Redman, FNP  Sonja [place], MD     REFERRING PHYSICIAN: Sonja Washburn, MD   DIAGNOSIS: The encounter diagnosis was Cancer of overlapping sites of left breast [facility]).   HISTORY OF PRESENT ILLNESS: Kristi David is a 83 y.o. female seen at the request of Dr. Maryalice Smaller for new diagnosis of left breast cancer.  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  When she went for diagnostic mammography on 01/04/2024, the right breast showed no suspicious findings, but the left breast showed a 7 to 8 mm group of calcifications in the subareolar location extending into the nipple, and a new isodense mass in the upper outer quadrant measuring up to 4 mm was noted.  By ultrasound the mass involves the entire nipple there was no mass or visible microcalcifications in the retroareolar area, and at the 2 o'clock position there was a superficial hypoechoic mass measuring 3 mm in greatest dimension.  Her axilla demonstrated a solitary abnormal lymph node with near complete loss of the fatty hilum.  She on physical exam had a fungating mass involving the entire left nipple which was pink to red in color.  A biopsy of the left breast mass under ultrasound guidance on 01/05/2024 showed a grade 2 invasive ductal carcinoma with associated intermediate grade DCIS. A sampled lymph node in the left axilla showed invasive ductal carcinoma with no lymphoid tissue identified. The breast specimen showed the cancer to be ER positive, PR negative HER2 negative by FISH with a Ki 67 of 10%.  She met with Dr. Maryalice Smaller and has been  referred to general surgery.  Dr. Maryalice Smaller suggested a contrast-enhanced mammogram versus MRI of the breast to assess for extent of disease.  She is seen to discuss the role of radiotherapy at the appropriate time.  Of note Dr. Maryalice Smaller does not feel the patient is a good candidate for systemic chemo based on her age and comorbidities.    PREVIOUS RADIATION THERAPY: No   PAST MEDICAL HISTORY:  Past Medical History:  Diagnosis Date   Acute blood loss anemia    Anemia    Arthritis    "legs, knees" (01/07/2016)   Arthritis of right hand 01/11/2017   Coronary artery calcification seen on CT scan    Diabetes mellitus (HCC)    Diastolic dysfunction    Dysarthria, post-stroke    Dysphagia, post-stroke    Facial droop    GERD (gastroesophageal reflux disease)    no meds   High cholesterol    History of blood transfusion    Hyperlipidemia    Hypertension    Hypertensive heart disease    Hypokalemia    Left basal ganglia embolic stroke (HCC) 07/18/2017   Osteoarthritis of right knee 01/06/2016   Seasonal allergies    Shortness of breath dyspnea    if too active   Stroke (HCC) 07/16/2017   Syncope    passed out and was hospitalized for a couple of days   Type II diabetes mellitus (HCC)    does not take medicine diet controlled  PAST SURGICAL HISTORY: Past Surgical History:  Procedure Laterality Date   BREAST BIOPSY Left 01/05/2024   US  LT BREAST BX W LOC DEV 1ST LESION IMG BX SPEC US  GUIDE 01/05/2024 [facility] MAMMOGRAPHY   BUNIONECTOMY Bilateral    CATARACT EXTRACTION W/ INTRAOCULAR LENS  IMPLANT, BILATERAL Bilateral    INGUINAL HERNIA REPAIR Right    KNEE ARTHROSCOPY WITH EXCISION BAKER'S CYST Right    MR LOWER LEG LEFT ([facility] HX) Left    after a break   PARTIAL KNEE ARTHROPLASTY Right 01/06/2016   Procedure: RIGHT UNICOMPARTMENTAL KNEE ARTHROPLASTY;  Surgeon: Arnie Lao, MD;  Location: [facility] OR;  Service: Orthopedics;  Laterality: Right;   VEIN LIGATION AND STRIPPING  Bilateral      FAMILY HISTORY:  Family History  Problem Relation Age of Onset   Stroke Mother    CAD Father    Cancer Sister 94       uterine cancer   Cancer Sister        gastric cancer     SOCIAL HISTORY:  reports that she has never smoked. She has never used smokeless tobacco. She reports that she does not drink alcohol and does not use drugs.  The patient is divorced and lives in [place] and lives with her son. 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 [employer] before moving to [facility].    ALLERGIES: Oregano [origanum oil], Peanut-containing drug products, and Tomato   MEDICATIONS:  Current Outpatient Medications  Medication Sig Dispense Refill   atorvastatin  (LIPITOR ) 80 MG tablet Take 1 tablet (80 mg total) by mouth daily at 6 PM. 30 tablet 0   blood glucose meter kit and supplies Dispense based on patient and insurance preference. Use up to four times daily as directed. (FOR ICD-9 250.00, 250.01). 1 each 0   citalopram  (CELEXA ) 40 MG tablet Take 1 tablet (40 mg total) by mouth daily. 30 tablet 0   clopidogrel  (PLAVIX ) 75 MG tablet Take 1 tablet (75 mg total) by mouth daily. 30 tablet 0   gabapentin  (NEURONTIN ) 100 MG capsule TAKE 1 CAPSULE BY MOUTH 2 TIMES DAILY AS NEEDED. 60 capsule 0   gabapentin  (NEURONTIN ) 600 MG tablet Take 1 tablet (600 mg total) by mouth at bedtime. 60 tablet 1   hyoscyamine  (LEVSIN, ANASPAZ ) 0.125 MG tablet Take 1 tablet (0.125 mg total) by mouth every 6 (six) hours as needed. 45 tablet 2   losartan  (COZAAR ) 100 MG tablet Take 1 tablet (100 mg total) by mouth daily. 30 tablet 0   metoprolol  succinate (TOPROL -XL) 25 MG 24 hr tablet Take 1 tablet (25 mg total) by mouth daily. 1 time curtesy refill for patient, further refill of this type of medication will need to be addressed by patients primary care provider 30 tablet 0   solifenacin (VESICARE) 10 MG tablet Take 10 mg by mouth daily as needed (frequent urination).      traMADol   (ULTRAM ) 50 MG tablet Take 1-2 tablets (50-100 mg total) by mouth every 12 (twelve) hours as needed. 40 tablet 0   No current facility-administered medications for this encounter.     REVIEW OF SYSTEMS: On review of systems, the patient reports that she is doing okay overall. She reports bleeding from her nipple and has a bandage over the area keeping it from bleeding. She is interested in transportation assistance. No other complaints are verbalized.     PHYSICAL EXAM:  Wt Readings from Last 3 Encounters:  01/12/24 155 lb (70.3 kg)  12/27/22 186 lb (84.4 kg)  02/22/19 181 lb (82.1 kg)   Temp Readings from Last 3 Encounters:  01/12/24 98.2 F (36.8 C)  10/23/23 98.1 F (36.7 C) (Oral)  12/28/22 (!) 97.3 F (36.3 C) (Oral)   BP Readings from Last 3 Encounters:  01/12/24 130/78  10/23/23 (!) 146/57  12/28/22 (!) 173/71   Pulse Readings from Last 3 Encounters:  01/12/24 73  10/23/23 (!) 57  12/28/22 63    In general this is a well appearing African American female in no acute distress. She's alert and oriented x4 and appropriate throughout the examination. Cardiopulmonary assessment is negative for acute distress and she exhibits normal effort. Her lift breast is inspected and has a fungating mass that has widened and flattened the nipple with active bleeding laterally.      ECOG = 1  0 - Asymptomatic (Fully active, able to carry on all predisease activities without restriction)  1 - Symptomatic but completely ambulatory (Restricted in physically strenuous activity but ambulatory and able to carry out work of a light or sedentary nature. For example, light housework, office work)  2 - Symptomatic, <50% in bed during the day (Ambulatory and capable of all self care but unable to carry out any work activities. Up and about more than 50% of waking hours)  3 - Symptomatic, >50% in bed, but not bedbound (Capable of only limited self-care, confined to bed or chair 50% or more  of waking hours)  4 - Bedbound (Completely disabled. Cannot carry on any self-care. Totally confined to bed or chair)  5 - Death   Aurea Blossom MM, Creech RH, Tormey DC, et al. [phone]). "Toxicity and response criteria of the [facility]". Am. Hillard Lowes. Oncol. 5 (6): 649-55    LABORATORY DATA:  Lab Results  Component Value Date   WBC 4.6 01/12/2024   HGB 12.6 01/12/2024   HCT 37.6 01/12/2024   MCV 86.6 01/12/2024   PLT 182 01/12/2024   Lab Results  Component Value Date   NA 139 01/12/2024   K 4.0 01/12/2024   CL 106 01/12/2024   CO2 26 01/12/2024   Lab Results  Component Value Date   ALT 12 01/12/2024   AST 21 01/12/2024   ALKPHOS 85 01/12/2024   BILITOT 1.0 01/12/2024      RADIOGRAPHY: US  LT BREAST BX W LOC DEV 1ST LESION IMG BX SPEC US  GUIDE Addendum Date: 01/09/2024 ADDENDUM REPORT: 01/09/2024 09:34 ADDENDUM: Pathology revealed GRADE II INVASIVE DUCTAL CARCINOMA, DUCTAL CARCINOMA IN SITU, CRIBRIFORM, INTERMEDIATE NUCLEAR GRADE of the LEFT breast, (A), 2 o'clock, 5cmfn, (coil clip). This was found to be concordant by Dr. Roda Cirri. Pathology revealed INVASIVE DUCTAL CARCINOMA, NO LYMPHOID TISSUE IDENTIFIED of the LEFT axilla, (B), (HydroMARK clip). This was found to be concordant by Dr. Roda Cirri. Pathology results were discussed with the patient's son, Candida Chalk by telephone, per request. The patient's son reported his mother was doing well after the biopsies with tenderness at the sites. Post biopsy instructions and care were reviewed and questions were answered. Mr. Samons was encouraged to call [facility] for any additional concerns. My direct phone number was provided. Medical oncology consultation has been arranged with Dr. Sonja Ferguson at [facility] on January 12, 2024. Pathology results reported by Kraig Peru, RN on 01/09/2024. Electronically Signed   By: Roda Cirri M.D.   On: 01/09/2024 09:34   Result  Date: 01/09/2024 CLINICAL DATA:  Patient  presents for ultrasound-guided core needle biopsy of a 3 mm irregular mass over the 2 o'clock position of the left breast 5 cm from the nipple in addition to an abnormal left axillary lymph node. Recent positive skin punch biopsy of the left nipple for mucinous carcinoma. EXAM: ULTRASOUND GUIDED LEFT BREAST CORE NEEDLE BIOPSY; ULTRASOUND-GUIDED LEFT AXILLARY LYMPH NODE BIOPSY. COMPARISON:  Previous exam(s). PROCEDURE: I met with the patient and we discussed the procedure of ultrasound-guided biopsy, including benefits and alternatives. We discussed the high likelihood of a successful procedure. We discussed the risks of the procedure, including infection, bleeding, tissue injury, clip migration, and inadequate sampling. Informed written consent was given. The usual time-out protocol was performed immediately prior to the procedure. A. Lesion quadrant: Left upper outer quadrant. Using sterile technique and 1% Lidocaine  as local anesthetic, under direct ultrasound visualization, a 14 gauge spring-loaded device was used to perform biopsy of the targeted 3 mm mass over the 2 o'clock position of the left breast 5 cm from the nipple using an inferior to superior approach. At the conclusion of the procedure a coil shaped tissue marker clip was deployed into the biopsy cavity. Follow up 2 view mammogram was performed and dictated separately. B. Lesion quadrant: Left axilla. Using sterile technique and 1% Lidocaine  as local anesthetic, under direct ultrasound visualization, a 14 gauge spring-loaded device was used to perform biopsy of the targeted abnormal left axillary lymph node using an inferior to superior approach. At the conclusion of the procedure a HydroMARK shaped tissue marker clip was deployed into the biopsy cavity. Follow up 2 view mammogram was performed and dictated separately. IMPRESSION: Ultrasound guided biopsy of an indeterminate left breast mass as well as abnormal  left axillary lymph node. No apparent complications. Electronically Signed: By: Roda Cirri M.D. On: 01/05/2024 13:41   US  AXILLARY NODE CORE BIOPSY LEFT Addendum Date: 01/09/2024 ADDENDUM REPORT: 01/09/2024 09:34 ADDENDUM: Pathology revealed GRADE II INVASIVE DUCTAL CARCINOMA, DUCTAL CARCINOMA IN SITU, CRIBRIFORM, INTERMEDIATE NUCLEAR GRADE of the LEFT breast, (A), 2 o'clock, 5cmfn, (coil clip). This was found to be concordant by Dr. Roda Cirri. Pathology revealed INVASIVE DUCTAL CARCINOMA, NO LYMPHOID TISSUE IDENTIFIED of the LEFT axilla, (B), (HydroMARK clip). This was found to be concordant by Dr. Roda Cirri. Pathology results were discussed with the patient's son, Candida Chalk by telephone, per request. The patient's son reported his mother was doing well after the biopsies with tenderness at the sites. Post biopsy instructions and care were reviewed and questions were answered. Mr. Paterson was encouraged to call [facility] for any additional concerns. My direct phone number was provided. Medical oncology consultation has been arranged with Dr. Sonja Lincoln at [facility] on January 12, 2024. Pathology results reported by Kraig Peru, RN on 01/09/2024. Electronically Signed   By: Roda Cirri M.D.   On: 01/09/2024 09:34   Result Date: 01/09/2024 CLINICAL DATA:  Patient presents for ultrasound-guided core needle biopsy of a 3 mm irregular mass over the 2 o'clock position of the left breast 5 cm from the nipple in addition to an abnormal left axillary lymph node. Recent positive skin punch biopsy of the left nipple for mucinous carcinoma. EXAM: ULTRASOUND GUIDED LEFT BREAST CORE NEEDLE BIOPSY; ULTRASOUND-GUIDED LEFT AXILLARY LYMPH NODE BIOPSY. COMPARISON:  Previous exam(s). PROCEDURE: I met with the patient and we discussed the procedure of ultrasound-guided biopsy, including benefits and alternatives. We discussed the high likelihood of a successful procedure.  We discussed the risks  of the procedure, including infection, bleeding, tissue injury, clip migration, and inadequate sampling. Informed written consent was given. The usual time-out protocol was performed immediately prior to the procedure. A. Lesion quadrant: Left upper outer quadrant. Using sterile technique and 1% Lidocaine  as local anesthetic, under direct ultrasound visualization, a 14 gauge spring-loaded device was used to perform biopsy of the targeted 3 mm mass over the 2 o'clock position of the left breast 5 cm from the nipple using an inferior to superior approach. At the conclusion of the procedure a coil shaped tissue marker clip was deployed into the biopsy cavity. Follow up 2 view mammogram was performed and dictated separately. B. Lesion quadrant: Left axilla. Using sterile technique and 1% Lidocaine  as local anesthetic, under direct ultrasound visualization, a 14 gauge spring-loaded device was used to perform biopsy of the targeted abnormal left axillary lymph node using an inferior to superior approach. At the conclusion of the procedure a HydroMARK shaped tissue marker clip was deployed into the biopsy cavity. Follow up 2 view mammogram was performed and dictated separately. IMPRESSION: Ultrasound guided biopsy of an indeterminate left breast mass as well as abnormal left axillary lymph node. No apparent complications. Electronically Signed: By: Roda Cirri M.D. On: 01/05/2024 13:41   MM CLIP PLACEMENT LEFT Result Date: 01/05/2024 CLINICAL DATA:  Patient is post ultrasound-guided core needle biopsy of a 3 mm irregular mass over the 2 o'clock position of the left breast 5 cm from the nipple in addition to an abnormal left axillary lymph node. EXAM: 3D DIAGNOSTIC LEFT MAMMOGRAM POST ULTRASOUND BIOPSY COMPARISON:  Previous exam(s). ACR Breast Density Category b: There are scattered areas of fibroglandular density. FINDINGS: 3D Mammographic images were obtained following ultrasound guided biopsy  of the targeted 3 mm mass over the 2 o'clock position of the left breast as well as the abnormal left axillary lymph node. The biopsy marking clip is in expected position at the site of biopsy over the 2 o'clock position of the left breast. The HydroMARK clip in the biopsied axillary lymph node is deep to the field of view and cannot be imaged mammographically. IMPRESSION: Appropriate positioning of the coil shaped biopsy marking clip at the site of biopsy in the 2 o'clock position of the left breast. HydroMARK clip within the biopsied axillary lymph node unable to be imaged due to deep location. Final Assessment: Post Procedure Mammograms for Marker Placement Electronically Signed   By: Roda Cirri M.D.   On: 01/05/2024 14:03   MM 3D DIAGNOSTIC MAMMOGRAM BILATERAL BREAST Addendum Date: 01/04/2024 ADDENDUM REPORT: 01/04/2024 15:06 ADDENDUM: After the diagnostic mammogram and ultrasound completed, the patient's son stated that an in-office punch biopsy of the nipple was performed. A copy of the pathology report from [facility] dated 12/29/2023 states that the pathology returned as mucinous neoplasm, favor mucinous carcinoma of the breast. Electronically Signed   By: Rinda Cheers M.D.   On: 01/04/2024 15:06   Result Date: 01/04/2024 CLINICAL DATA:  83 year old with an approximate 1 month history of erythema and irritation involving the LEFT nipple. The patient was given a course of antibiotic therapy without resolution of symptoms. EXAM: DIGITAL DIAGNOSTIC BILATERAL MAMMOGRAM WITH TOMOSYNTHESIS AND CAD; ULTRASOUND LEFT BREAST LIMITED TECHNIQUE: Bilateral digital diagnostic mammography and breast tomosynthesis was performed. The images were evaluated with computer-aided detection. ; Targeted ultrasound examination of the left breast was performed. COMPARISON:  Previous exams, most recently mammography 01/15/2020. ACR Breast Density Category b: There are scattered areas of fibroglandular density. FINDINGS: Full  field CC and MLO views of both breasts, a spot compression CC view of the subareolar location, a spot magnification lateral view of the subareolar location, and spot compression CC and MLO views of a mammographic finding were obtained. RIGHT: No findings suspicious for malignancy. LEFT: Approximate 0.7-0.8 cm group of calcifications involving the subareolar location extending into the nipple, many of which are in a linear orientation. No visible mass in the subareolar location. New isodense mass in the UPPER OUTER QUADRANT at middle depth measuring approximately 0.4 cm. The mass is most conspicuous on the spot CC view, and is likely compressible on the spot MLO view. No associated architectural distortion or suspicious calcifications. Targeted ultrasound is performed, demonstrating the mass involving the entire nipple. There is no mass or visible microcalcifications in the subareolar location that would be accessible for needle biopsy. At 2 o'clock 5 cm from the nipple there is a superficial hypoechoic mass with mildly irregular margins measuring 0.3 cm diameter, demonstrating mixed posterior characteristics and no internal power Doppler flow, corresponding to the mammographic mass. Sonographic evaluation of the axilla demonstrates a solitary abnormal lymph node with near complete loss of the fatty hilum inferolaterally. On correlative physical examination, there is a fungating mass involving the entire LEFT nipple which is pink to red in color. IMPRESSION: 1. Indeterminate 0.3 cm mass in the UPPER OUTER QUADRANT of the LEFT breast at 2 o'clock 5 cm from the nipple. 2. Solitary pathologic inferolateral LEFT axillary lymph node with near complete loss of the hilum. 3. Fungating mass involving the LEFT nipple on physical examination. Suspicious calcifications involving the LEFT nipple. 4. No mammographic evidence of malignancy involving the RIGHT breast. RECOMMENDATION: 1. Ultrasound-guided core needle biopsy of the  LEFT breast mass and the solitary abnormal LEFT axillary lymph node. 2. Surgical consultation for potential punch biopsy of the fungating mass involving the LEFT nipple. I have discussed the findings and recommendations with the patient. The ultrasound core needle biopsy procedure was discussed with the patient and her questions were answered. She wishes to proceed with the biopsy which will be scheduled by the [facility] staff. BI-RADS CATEGORY  4: Suspicious. Electronically Signed: By: Rinda Cheers M.D. On: 01/04/2024 14:58   US  LIMITED ULTRASOUND INCLUDING AXILLA LEFT BREAST  Addendum Date: 01/04/2024 ADDENDUM REPORT: 01/04/2024 15:06 ADDENDUM: After the diagnostic mammogram and ultrasound completed, the patient's son stated that an in-office punch biopsy of the nipple was performed. A copy of the pathology report from [facility] dated 12/29/2023 states that the pathology returned as mucinous neoplasm, favor mucinous carcinoma of the breast. Electronically Signed   By: Rinda Cheers M.D.   On: 01/04/2024 15:06   Result Date: 01/04/2024 CLINICAL DATA:  83 year old with an approximate 1 month history of erythema and irritation involving the LEFT nipple. The patient was given a course of antibiotic therapy without resolution of symptoms. EXAM: DIGITAL DIAGNOSTIC BILATERAL MAMMOGRAM WITH TOMOSYNTHESIS AND CAD; ULTRASOUND LEFT BREAST LIMITED TECHNIQUE: Bilateral digital diagnostic mammography and breast tomosynthesis was performed. The images were evaluated with computer-aided detection. ; Targeted ultrasound examination of the left breast was performed. COMPARISON:  Previous exams, most recently mammography 01/15/2020. ACR Breast Density Category b: There are scattered areas of fibroglandular density. FINDINGS: Full field CC and MLO views of both breasts, a spot compression CC view of the subareolar location, a spot magnification lateral view of the subareolar location, and spot  compression CC and MLO views of a mammographic finding were obtained. RIGHT: No findings  suspicious for malignancy. LEFT: Approximate 0.7-0.8 cm group of calcifications involving the subareolar location extending into the nipple, many of which are in a linear orientation. No visible mass in the subareolar location. New isodense mass in the UPPER OUTER QUADRANT at middle depth measuring approximately 0.4 cm. The mass is most conspicuous on the spot CC view, and is likely compressible on the spot MLO view. No associated architectural distortion or suspicious calcifications. Targeted ultrasound is performed, demonstrating the mass involving the entire nipple. There is no mass or visible microcalcifications in the subareolar location that would be accessible for needle biopsy. At 2 o'clock 5 cm from the nipple there is a superficial hypoechoic mass with mildly irregular margins measuring 0.3 cm diameter, demonstrating mixed posterior characteristics and no internal power Doppler flow, corresponding to the mammographic mass. Sonographic evaluation of the axilla demonstrates a solitary abnormal lymph node with near complete loss of the fatty hilum inferolaterally. On correlative physical examination, there is a fungating mass involving the entire LEFT nipple which is pink to red in color. IMPRESSION: 1. Indeterminate 0.3 cm mass in the UPPER OUTER QUADRANT of the LEFT breast at 2 o'clock 5 cm from the nipple. 2. Solitary pathologic inferolateral LEFT axillary lymph node with near complete loss of the hilum. 3. Fungating mass involving the LEFT nipple on physical examination. Suspicious calcifications involving the LEFT nipple. 4. No mammographic evidence of malignancy involving the RIGHT breast. RECOMMENDATION: 1. Ultrasound-guided core needle biopsy of the LEFT breast mass and the solitary abnormal LEFT axillary lymph node. 2. Surgical consultation for potential punch biopsy of the fungating mass involving the LEFT  nipple. I have discussed the findings and recommendations with the patient. The ultrasound core needle biopsy procedure was discussed with the patient and her questions were answered. She wishes to proceed with the biopsy which will be scheduled by the [facility] staff. BI-RADS CATEGORY  4: Suspicious. Electronically Signed: By: Rinda Cheers M.D. On: 01/04/2024 14:58       IMPRESSION/PLAN: 1. At least Stage IIA, cT1aN1M0, grade 2, ER positive invasive ductal carcinoma of the left breast. Dr. Jeryl Moris discusses the pathology findings and reviews the nature of node positive breast disease. The patient will meet with Dr. Cherlynn Cornfield tomorrow and has staging CT CAP and bone scan scheduled in about two weeks. She is aware that Dr. Cherlynn Cornfield may recommend an MRI or contrast enhanced mammogram to determine extent of disease. Dr. Maryalice Smaller does not recommend chemotherapy. The patient is counseled on the rationale for external radiotherapy to the breast and regional nodes to reduce risks of local recurrence. Dr. Maryalice Smaller also anticipates adjuvant antiestrogen therapy to follow. We discussed the risks, benefits, short, and long term effects of radiotherapy, as well as the curative intent, and the patient is interested in proceeding. Dr. Jeryl Moris discusses the delivery and logistics of radiotherapy and anticipates a course of 6 1/2 weeks of radiotherapy to the left breast and regional nodes with deep inspiration breath hold technique. We will see her back a few weeks after surgery to discuss the simulation process and anticipate we starting radiotherapy about 4-6 weeks after surgery.    In a visit lasting 60 minutes, greater than 50% of the time was spent face to face reviewing her case, as well as in preparation of, discussing, and coordinating the patient's care.  The above documentation reflects my direct findings during this shared patient visit. Please see the separate note by Dr. Jeryl Moris on this date for  the  remainder of the patient's plan of care.    Shelvia Dick, [facility]    **Disclaimer: This note was dictated with voice recognition software. Similar sounding words can inadvertently be transcribed and this note may contain transcription errors which may not have been corrected upon publication of note.**

## 2024-01-23 ENCOUNTER — Other Ambulatory Visit: Payer: Self-pay | Admitting: General Surgery

## 2024-01-23 ENCOUNTER — Telehealth (HOSPITAL_BASED_OUTPATIENT_CLINIC_OR_DEPARTMENT_OTHER): Payer: Self-pay | Admitting: *Deleted

## 2024-01-23 DIAGNOSIS — C50812 Malignant neoplasm of overlapping sites of left female breast: Secondary | ICD-10-CM

## 2024-01-23 NOTE — Telephone Encounter (Signed)
   Pre-operative Risk Assessment    Patient Name: Kristi David  DOB: Oct 30, 1940 MRN: [id number]   Date of last office visit: 02/22/2019 Date of next office visit: None  Request for Surgical Clearance    Procedure:   Breast Cancer Surgery (URGENT)  Date of Surgery:  Clearance TBD                                 Surgeon:  Dr. [name] Surgeon's Group or Practice Name:  [facility] Phone number:  [phone] Fax number:  [phone]   Type of Clearance Requested:   - Medical  - Pharmacy:  Hold Clopidogrel  (Plavix ) Not Indicated   Type of Anesthesia:  General    Additional requests/questions:    Signed, Lauris Port   01/23/2024, 12:52 PM

## 2024-01-23 NOTE — Telephone Encounter (Signed)
 Preoperative team,  Please review Dr. Marvia Slocumb the day schedule to see if patient can be seen urgently.  Patient will need an in office appointment because she has not been seen in several years.  Patient's Plavix  is not prescribed by cardiology.  She has a history of CVA and it appears that her PCP will need to make recommendations on holding Plavix .  Thank you for your help.  Chet Cota. Steed Kanaan NP-C     01/23/2024, 1:09 PM [facility] [address] Office [phone] Fax [phone]

## 2024-01-25 NOTE — Therapy (Signed)
 OUTPATIENT PHYSICAL THERAPY BREAST CANCER BASELINE EVALUATION   Patient Name: Kristi David MRN: [id number] DOB:[birth year], 83 y.o., female Today's Date: 01/26/2024  END OF SESSION:  PT End of Session - 01/26/24 1249     Visit Number 1    Number of Visits 2    Date for PT Re-Evaluation 04/19/24    Authorization Type none needed    PT Start Time 1200    PT Stop Time 1239    PT Time Calculation (min) 39 min    Activity Tolerance Patient tolerated treatment well    Behavior During Therapy WFL for tasks assessed/performed             Past Medical History:  Diagnosis Date   Acute blood loss anemia    Anemia    Arthritis    "legs, knees" (01/07/2016)   Arthritis of right hand 01/11/2017   Coronary artery calcification seen on CT scan    Diabetes mellitus (HCC)    Diastolic dysfunction    Dysarthria, post-stroke    Dysphagia, post-stroke    Facial droop    GERD (gastroesophageal reflux disease)    no meds   High cholesterol    History of blood transfusion    Hyperlipidemia    Hypertension    Hypertensive heart disease    Hypokalemia    Left basal ganglia embolic stroke (HCC) 07/18/2017   Osteoarthritis of right knee 01/06/2016   Seasonal allergies    Shortness of breath dyspnea    if too active   Stroke (HCC) 07/16/2017   Syncope    passed out and was hospitalized for a couple of days   Type II diabetes mellitus (HCC)    does not take medicine diet controlled   Past Surgical History:  Procedure Laterality Date   BREAST BIOPSY Left 01/05/2024   US  LT BREAST BX W LOC DEV 1ST LESION IMG BX SPEC US  GUIDE 01/05/2024 [facility]   BUNIONECTOMY Bilateral    CATARACT EXTRACTION W/ INTRAOCULAR LENS  IMPLANT, BILATERAL Bilateral    INGUINAL HERNIA REPAIR Right    KNEE ARTHROSCOPY WITH EXCISION BAKER'S CYST Right    MR LOWER LEG LEFT ([facility] HX) Left    after a break   PARTIAL KNEE ARTHROPLASTY Right 01/06/2016   Procedure: RIGHT UNICOMPARTMENTAL KNEE ARTHROPLASTY;   Surgeon: Arnie Lao, MD;  Location: [facility] OR;  Service: Orthopedics;  Laterality: Right;   VEIN LIGATION AND STRIPPING Bilateral    Patient Active Problem List   Diagnosis Date Noted   Cancer of overlapping sites of left breast (HCC) 01/12/2024   Unilateral primary osteoarthritis, right knee 02/06/2020   Unilateral primary osteoarthritis, left knee 02/06/2020   Adhesive capsulitis of right shoulder 11/28/2017   Left basal ganglia embolic stroke (HCC) 07/18/2017   Facial droop    Right sided weakness    Diabetes mellitus (HCC)    Diastolic dysfunction    Dysphagia, post-stroke    Dysarthria, post-stroke    Hypokalemia    Acute blood loss anemia    Hyperlipidemia    Stroke (HCC) 07/16/2017   Hypertension 07/16/2017   Arthritis of right hand 01/11/2017   Osteoarthritis of right knee 01/06/2016   Status post right partial knee replacement 01/06/2016    PCP: Sharry Deem, NP  REFERRING PROVIDER: [name], MD  REFERRING DIAG: left breast cancer  THERAPY DIAG:  Cancer of overlapping sites of left breast (HCC)  At risk for lymphedema  Abnormal posture  Rationale for Evaluation and Treatment: Rehabilitation  ONSET DATE: 01/12/24  SUBJECTIVE:                                                                                                                                                                                           SUBJECTIVE STATEMENT: Patient reports she is here today to be seen by her medical team for her newly diagnosed left breast cancer.   PERTINENT HISTORY:  Patient was diagnosed on 01/12/24 with left grade IDC. It measures .3 cm and is located in the upper outer quadrant and a fungating mass involving the left nipple. It is ER positive, PR neg, HERE2neg. PMH includes stroke that affected the Rt side, HTN, DM  PATIENT GOALS:   reduce lymphedema risk and learn post op HEP.   PAIN:  Are you having pain? No  PRECAUTIONS: Active CA   RED  FLAGS: None   HAND DOMINANCE: right- but uses left more due to stroke   WEIGHT BEARING RESTRICTIONS: No  FALLS:  Has patient fallen in last 6 months? Yes. Number of falls 3 1st time: I got dizzy and fell in my room I had bruised my ribs The other times they were "easy falls", one off the toilet seat and the other one was not reported  No fear of falling Uses SPC out of the house.    LIVING ENVIRONMENT: Patient lives with: her son , helper that comes 3x per week to help with self care  No stairs  Has following equipment at home: Single point cane, Walker - 2 wheeled, and shower chair, raised toilet seat   OCCUPATION: not working  LEISURE: I do the dishes, go on walks   PRIOR LEVEL OF FUNCTION: Independent with basic ADLs   OBJECTIVE: Note: Objective measures were completed at Evaluation unless otherwise noted.  COGNITION: Overall cognitive status: Within functional limits for tasks assessed    POSTURE:  Forward head and rounded shoulders posture  UPPER EXTREMITY AROM/PROM: SBA for standing   A/PROM RIGHT   eval   Shoulder extension   Shoulder flexion 85  Shoulder abduction 60  Shoulder internal rotation   Shoulder external rotation     (Blank rows = not tested)  A/PROM LEFT   eval  Shoulder extension   Shoulder flexion 120  Shoulder abduction 133  Shoulder internal rotation   Shoulder external rotation     (Blank rows = not tested)  CERVICAL AROM: All within normal limits:   UPPER EXTREMITY STRENGTH: Rt side limited from CVA   LYMPHEDEMA ASSESSMENTS (in cm):   LANDMARK RIGHT   eval  10 cm proximal to olecranon process 27  Olecranon process 24.1  10 cm proximal to ulnar styloid process 17.4  Just proximal to ulnar styloid process 16.2  Across hand at thumb web space 19.2  At base of 2nd digit 6.4  (Blank rows = not tested)  LANDMARK LEFT   eval  10 cm proximal to olecranon process 27.3  Olecranon process 24.5  10 cm proximal to ulnar styloid  process 17.1  Just proximal to ulnar styloid process 16.5  Across hand at thumb web space 19.5  At base of 2nd digit 6.5  (Blank rows = not tested)  L-DEX LYMPHEDEMA SCREENING: The patient was assessed using the L-Dex machine today to produce a lymphedema index baseline score. The patient will be reassessed on a regular basis (typically every 3 months) to obtain new L-Dex scores. If the score is > 6.5 points away from his/her baseline score indicating onset of subclinical lymphedema, it will be recommended to wear a compression garment for 4 weeks, 12 hours per day and then be reassessed. If the score continues to be > 6.5 points from baseline at reassessment, we will initiate lymphedema treatment. Assessing in this manner has a 95% rate of preventing clinically significant lymphedema.  QUICK DASH SURVEY: 18.18%  PATIENT EDUCATION:  Education details: Time spent educating patient on aspects of self-care to maximize post op recovery. Patient was educated on where and how to get a post op compression bra to use to reduce post op edema. Patient was also educated on the use of SOZO screenings and surveillance principles for early identification of lymphedema onset. She was instructed to use the post op pillow in the axilla for pressure and pain relief. Patient educated on lymphedema risk reduction and post op shoulder/posture HEP. Person educated: Patient Education method: Explanation, Demonstration, Handout Education comprehension: Patient verbalized understanding and returned demonstration  HOME EXERCISE PROGRAM: Patient was instructed today in a home exercise program today for post op shoulder range of motion. These included wall flexion, scapular retraction, wall walking with shoulder abduction, and hands behind head external rotation.  She was encouraged to do these twice a day, holding 3 seconds and repeating 5 times when permitted by her physician.   ASSESSMENT:  CLINICAL IMPRESSION: Pt  has some baseline issues with mobility using a SPC. She reports 3 falls in the past 6 months with the most recent one causing injury to the Rt ribs.  Pt lives with her son and has help from a caregiver.  Her HEP was modified to wall walking into flexion due to stroke limiting movement of the Rt arm.  Her SOZO was also already elevated for unknown reasons but she does have a fungating tumor with LN involvement. Pt will benefit from a post op PT reassessment to determine needs and from L-Dex screens every 3 months for 2 years to detect subclinical lymphedema.  Pt will benefit from skilled therapeutic intervention to improve on the following deficits: Decreased knowledge of precautions, impaired UE functional use, pain, decreased ROM, postural dysfunction.   PT treatment/interventions: ADL/self-care home management, pt/family education, therapeutic exercise  REHAB POTENTIAL: Excellent  CLINICAL DECISION MAKING: Stable/uncomplicated  EVALUATION COMPLEXITY: Low   GOALS: Goals reviewed with patient? YES  LONG TERM GOALS: (STG=LTG)    Name Target Date Goal status  1 Pt will be able to verbalize understanding of pertinent lymphedema risk reduction practices relevant to her dx specifically related to skin care.  Baseline:  No knowledge 01/26/2024 Achieved at eval  2 Pt will be able to return demo and/or verbalize understanding of  the post op HEP related to regaining shoulder ROM. Baseline:  No knowledge 01/26/2024 Achieved at eval  3 Pt will be able to verbalize understanding of the importance of viewing the post op After Breast CA Class video for further lymphedema risk reduction education and therapeutic exercise.  Baseline:  No knowledge 01/26/2024 Achieved at eval  4 Pt will demo she has regained full shoulder ROM and function post operatively compared to baselines.  Baseline: See objective measurements taken today. 04/19/24     PLAN:  PT FREQUENCY/DURATION: EVAL and 1 follow up appointment.    PLAN FOR NEXT SESSION: will reassess 3-4 weeks post op to determine needs.   Patient will follow up at outpatient cancer rehab 3-4 weeks following surgery.  If the patient requires physical therapy at that time, a specific plan will be dictated and sent to the referring physician for approval. The patient was educated today on appropriate basic range of motion exercises to begin post operatively and the importance of viewing the After Breast Cancer class video following surgery.  Patient was educated today on lymphedema risk reduction practices as it pertains to recommendations that will benefit the patient immediately following surgery.  She verbalized good understanding.    Physical Therapy Information for After Breast Cancer Surgery/Treatment:  Lymphedema is a swelling condition that you may be at risk for in your arm if you have lymph nodes removed from the armpit area.  After a sentinel node biopsy, the risk is approximately 5-9% and is higher after an axillary node dissection.  There is treatment available for this condition and it is not life-threatening.  Contact your physician or physical therapist with concerns. You may begin the 4 shoulder/posture exercises (see additional sheet) when permitted by your physician (typically a week after surgery).  If you have drains, you may need to wait until those are removed before beginning range of motion exercises.  A general recommendation is to not lift your arms above shoulder height until drains are removed.  These exercises should be done to your tolerance and gently.  This is not a "no pain/no gain" type of recovery so listen to your body and stretch into the range of motion that you can tolerate, stopping if you have pain.  If you are having immediate reconstruction, ask your plastic surgeon about doing exercises as he or she may want you to wait. We encourage you to view the After Breast Cancer class video following surgery.  You will learn  information related to lymphedema risk, prevention and treatment and additional exercises to regain mobility following surgery.   While undergoing any medical procedure or treatment, try to avoid blood pressure being taken or needle sticks from occurring on the arm on the side of cancer.   This recommendation begins after surgery and continues for the rest of your life.  This may help reduce your risk of getting lymphedema (swelling in your arm). An excellent resource for those seeking information on lymphedema is the National Lymphedema Network's web site. It can be accessed at www.lymphnet.org If you notice swelling in your hand, arm or breast at any time following surgery (even if it is many years from now), please contact your doctor or physical therapist to discuss this.  Lymphedema can be treated at any time but it is easier for you if it is treated early on.  If you feel like your shoulder motion is not returning to normal in a reasonable amount of time, please contact your surgeon or  physical therapist.  [facility] [phone]. [address], [place] [address]  [facility] CLASS After Breast Cancer Class  After Breast Cancer Class is a specially designed exercise class video to assist you in a safe recover after having breast cancer surgery.  In this video you will learn how to get back to full function whether your drains were just removed or if you had surgery a month ago. The video can be viewed on this page: https://www.boyd-meyer.org/ or on YouTube here: https://youtu.[email or web address].  Class Goals  Understand specific stretches to improve the flexibility of you chest and shoulder. Learn ways to safely strengthen your upper body and improve your posture. Understand the warning signs of infection and why you may be at risk for an arm infection. Learn about Lymphedema and prevention.  **  You do not need to view this video until after surgery.  Drains should be removed to participate in the recommended exercises on the video.  Patient was instructed today in a home exercise program today for post op shoulder range of motion. These included active assist shoulder flexion in sitting, scapular retraction, wall walking with shoulder abduction, and hands behind head external rotation.  She was encouraged to do these twice a day, holding 3 seconds and repeating 5 times when permitted by her physician.    Encarnacion Harris, PT 01/26/2024, 12:50 PM

## 2024-01-26 ENCOUNTER — Encounter: Payer: Self-pay | Admitting: Rehabilitation

## 2024-01-26 ENCOUNTER — Ambulatory Visit: Attending: General Surgery | Admitting: Rehabilitation

## 2024-01-26 ENCOUNTER — Other Ambulatory Visit: Payer: Self-pay

## 2024-01-26 DIAGNOSIS — Z9189 Other specified personal risk factors, not elsewhere classified: Secondary | ICD-10-CM | POA: Insufficient documentation

## 2024-01-26 DIAGNOSIS — R293 Abnormal posture: Secondary | ICD-10-CM | POA: Diagnosis present

## 2024-01-26 DIAGNOSIS — C50812 Malignant neoplasm of overlapping sites of left female breast: Secondary | ICD-10-CM | POA: Insufficient documentation

## 2024-01-30 ENCOUNTER — Ambulatory Visit: Admitting: Cardiology

## 2024-01-31 ENCOUNTER — Ambulatory Visit (HOSPITAL_COMMUNITY)
Admission: RE | Admit: 2024-01-31 | Discharge: 2024-01-31 | Disposition: A | Discharge status: Home / Self Care | Source: Ambulatory Visit | Attending: Hematology | Admitting: Hematology

## 2024-01-31 DIAGNOSIS — C50812 Malignant neoplasm of overlapping sites of left female breast: Secondary | ICD-10-CM | POA: Diagnosis present

## 2024-01-31 MED ORDER — IOHEXOL 300 MG/ML  SOLN
100.0000 mL | Freq: Once | INTRAMUSCULAR | Status: AC | PRN
  Administered 2024-01-31: 100 mL via INTRAVENOUS

## 2024-01-31 MED ORDER — TECHNETIUM TC 99M MEDRONATE IV KIT
21.6000 | PACK | Freq: Once | INTRAVENOUS | Status: AC
  Administered 2024-01-31: 21.6 via INTRAVENOUS

## 2024-02-01 ENCOUNTER — Encounter: Payer: Self-pay | Admitting: *Deleted

## 2024-02-03 ENCOUNTER — Telehealth: Payer: Self-pay | Admitting: *Deleted

## 2024-02-03 NOTE — Telephone Encounter (Signed)
 Another clearance request was received through [facility] today listing this as urgent.  As of today Pt still needs New Pt appt.

## 2024-02-03 NOTE — Telephone Encounter (Signed)
 Left message on identified VM that scans are negative.

## 2024-02-03 NOTE — Telephone Encounter (Signed)
 2nd attempt to reach pt to schedule a NEW PT APPT FOR PREOP CLEARANCE.  Dr. Cherlynn Cornfield has stated surgery is urgent for breast cancer.   I will ask if our scheduling team may be able to help find an appt with MD or DOD for an Urgent  new pt appt.

## 2024-02-03 NOTE — Telephone Encounter (Signed)
 Message has been sent to the scheduling department in order to get her in to be seen.

## 2024-02-06 NOTE — Telephone Encounter (Signed)
 Pt has new pt appt 02/07/24 with Dr. Alroy Aspen. I will update all parties  involved.

## 2024-02-07 ENCOUNTER — Ambulatory Visit: Attending: Cardiovascular Disease | Admitting: Cardiovascular Disease

## 2024-02-07 ENCOUNTER — Encounter: Payer: Self-pay | Admitting: Cardiovascular Disease

## 2024-02-07 VITALS — BP 122/78 | HR 62 | Ht 67.5 in | Wt 155.8 lb

## 2024-02-07 DIAGNOSIS — I69322 Dysarthria following cerebral infarction: Secondary | ICD-10-CM

## 2024-02-07 DIAGNOSIS — I10 Essential (primary) hypertension: Secondary | ICD-10-CM

## 2024-02-07 DIAGNOSIS — I63312 Cerebral infarction due to thrombosis of left middle cerebral artery: Secondary | ICD-10-CM | POA: Diagnosis not present

## 2024-02-07 NOTE — Telephone Encounter (Signed)
 Patient is a low risk for her breast surgery from a cardiac standpoint.  She is on plavix  for a previous stroke. I am unable to comment on holding her plavix  prior to surgery .   Her primary MD or neurology will need to address that.     Ahmad Alert, MD  02/07/2024 9:35 AM    [facility] [address] [place],  [address] [place], [place]  [address] Phone: [phone]; Fax: [phone]

## 2024-02-07 NOTE — Patient Instructions (Signed)
 Testing/Procedures: Neurology to determine/provide pre-op clearance  Follow-Up: At [facility], you and your health needs are our priority.  As part of our continuing mission to provide you with exceptional heart care, our providers are all part of one team.  This team includes your primary Cardiologist (physician) and Advanced Practice Providers or APPs (Physician Assistants and Nurse Practitioners) who all work together to provide you with the care you need, when you need it.  Your next appointment:   As Needed  Provider:   Ahmad Alert, MD

## 2024-02-07 NOTE — Telephone Encounter (Signed)
   Name: Kristi David  DOB: [birth year]  MRN: [id number]   Primary Cardiologist: Avery Bodo, MD  Chart reviewed as part of pre-operative protocol coverage. Hellen Rodrick was last seen on 02/07/2024 by Dr. Alroy Aspen.  Per Dr. Alroy Aspen "Patient is a low risk for her breast surgery from a cardiac standpoint.  She is on plavix  for a previous stroke. I am unable to comment on holding her plavix  prior to surgery .   Her primary MD or neurology will need to address that."  Therefore, based on ACC/AHA guidelines, the patient would be an acceptable risk for the planned procedure without further cardiovascular testing.   Plavix  prescribed by a noncardiology provider therefore recommendations for holding deferred to prescribing provider.    I will route this recommendation to the requesting party via Epic fax function and remove from pre-op pool. Please call with questions.  Morey Ar, NP 02/07/2024, 10:49 AM

## 2024-02-07 NOTE — Progress Notes (Signed)
  Cardiology Office Note:  .   Date:  02/07/2024  ID:  Kristi David, DOB December 25, 1940, MRN [id number] PCP: Alejandro Hurt, FNP  [facility] Providers Cardiologist:  Avery Bodo, MD    History of Present Illness: .   Kristi David is a 83 y.o. female with a hx of stroke .  She has been on plavix  for her stroke  She needs to have breast cancer surgery She is on plavix  for her strok  She says that she does not take the plavix  every day    She saw Dr. Cena Collet in 2018  Originally from [place] , but spent most of her life in [place] Normal LV function by echo in 2018 , grade I DD  No CP , no dyspnea  Is fairly active     ROS:   Studies Reviewed: Aaron Aas   EKG Interpretation Date/Time:  Tuesday Feb 07 2024 08:48:26 EDT Ventricular Rate:  62 PR Interval:  166 QRS Duration:  76 QT Interval:  450 QTC Calculation: 456 R Axis:   -66  Text Interpretation: Normal sinus rhythm Low voltage QRS Left anterior fascicular block Cannot rule out Anterior infarct (cited on or before 27-Dec-2022) When compared with ECG of 27-Dec-2022 22:05, Questionable change in initial forces of Lateral leads T wave inversion no longer evident in Inferior leads No significant change since last tracing Confirmed by Ahmad Alert ([id number]) on 02/07/2024 8:58:01 AM     Risk Assessment/Calculations:             Physical Exam:   VS:  BP 122/78   Pulse 62   Ht 5' 7.5" (1.715 m)   Wt 155 lb 12.8 oz (70.7 kg)   SpO2 98%   BMI 24.04 kg/m    Wt Readings from Last 3 Encounters:  02/07/24 155 lb 12.8 oz (70.7 kg)  01/19/24 156 lb 8 oz (71 kg)  01/12/24 155 lb (70.3 kg)    GEN: elderly female , in no acute distress NECK: No JVD; No carotid bruits CARDIAC: RRR, no murmurs, rubs, gallops RESPIRATORY:  Clear to auscultation without rales, wheezing or rhonchi  ABDOMEN: Soft, non-tender, non-distended EXTREMITIES:  No edema; No deformity    ASSESSMENT AND PLAN: .     Pre op cardiology eval:   she is very stable .  BP  is well controlled.  No CP , no dyspnea  She is on plavix  due to a stroke I am not able to comment on holding her Plavix   She will need to see her primary MD or neuro to comment on holding plavix   She did inform me that she does not take in every day .  Was not able to tell me how often she takes it   2. HTN:  BP is well controlled.   3.  Stroke:  per primary MD / neuro .           Dispo: PRN    Signed, Ahmad Alert, MD

## 2024-02-14 ENCOUNTER — Other Ambulatory Visit: Payer: Self-pay | Admitting: General Surgery

## 2024-02-14 ENCOUNTER — Encounter: Payer: Self-pay | Admitting: *Deleted

## 2024-02-14 DIAGNOSIS — C50812 Malignant neoplasm of overlapping sites of left female breast: Secondary | ICD-10-CM

## 2024-02-22 ENCOUNTER — Telehealth: Payer: Self-pay | Admitting: Orthopaedic Surgery

## 2024-02-22 NOTE — Telephone Encounter (Signed)
 Pt called requesting to submit to insurance company for bil knee durolane injection. Please all pt when approved at [phone].

## 2024-02-27 NOTE — Telephone Encounter (Signed)
 Submitted online [employer], pending for Monovisc.

## 2024-02-28 NOTE — Telephone Encounter (Signed)
 VOB submitted for Durolane, bilateral knee due to insurance.

## 2024-03-01 ENCOUNTER — Telehealth: Payer: Self-pay

## 2024-03-01 DIAGNOSIS — M1712 Unilateral primary osteoarthritis, left knee: Secondary | ICD-10-CM

## 2024-03-01 DIAGNOSIS — M1711 Unilateral primary osteoarthritis, right knee: Secondary | ICD-10-CM

## 2024-03-01 NOTE — Telephone Encounter (Signed)
 Talked with patient about scheduling for gel injection, but patient stated that she will proceed with injection at a later time due to preparing for a surgery that she is having soon.  See referrals tab

## 2024-03-01 NOTE — Pre-Procedure Instructions (Signed)
 Surgical Instructions   Your procedure is scheduled on March 08, 2024. Report to [facility] Main Entrance "A" at 9:00 A.M., then check in with the Admitting office. Any questions or running late day of surgery: call [phone]  Questions prior to your surgery date: call [phone], Monday-Friday, 8am-4pm. If you experience any cold or flu symptoms such as cough, fever, chills, shortness of breath, etc. between now and your scheduled surgery, please notify us  at the above number.     Remember:  Do not eat after midnight the night before your surgery   You may drink clear liquids until 8:00 AM the morning of your surgery.   Clear liquids allowed are: Water, Non-Citrus Juices (without pulp), Carbonated Beverages, Clear Tea (no milk, honey, etc.), Black Coffee Only (NO MILK, CREAM OR POWDERED CREAMER of any kind), and Gatorade.    Take these medicines the morning of surgery with A SIP OF WATER: citalopram  (CELEXA )  metoprolol  succinate (TOPROL -XL)    May take these medicines IF NEEDED: hyoscyamine  (LEVSIN, ANASPAZ )  solifenacin (VESICARE)  traMADol  (ULTRAM )    STOP taking your clopidogrel  (PLAVIX ) five days prior to surgery. Your last dose will be June 6th.   One week prior to surgery, STOP taking any Aspirin  (unless otherwise instructed by your surgeon) Aleve, Naproxen, Ibuprofen, Motrin, Advil, Goody's, BC's, all herbal medications, fish oil, and non-prescription vitamins.                     Do NOT Smoke (Tobacco/Vaping) for 24 hours prior to your procedure.  If you use a CPAP at night, you may bring your mask/headgear for your overnight stay.   You will be asked to remove any contacts, glasses, piercing's, hearing aid's, dentures/partials prior to surgery. Please bring cases for these items if needed.    Patients discharged the day of surgery will not be allowed to drive home, and someone needs to stay with them for 24 hours.  SURGICAL WAITING ROOM VISITATION Patients  may have no more than 2 support people in the waiting area - these visitors may rotate.   Pre-op nurse will coordinate an appropriate time for 1 ADULT support person, who may not rotate, to accompany patient in pre-op.  Children under the age of 78 must have an adult with them who is not the patient and must remain in the main waiting area with an adult.  If the patient needs to stay at the hospital during part of their recovery, the visitor guidelines for inpatient rooms apply.  Please refer to the [facility] website for the visitor guidelines for any additional information.   If you received a COVID test during your pre-op visit  it is requested that you wear a mask when out in public, stay away from anyone that may not be feeling well and notify your surgeon if you develop symptoms. If you have been in contact with anyone that has tested positive in the last 10 days please notify you surgeon.      Pre-operative CHG Bathing Instructions   You can play a key role in reducing the risk of infection after surgery. Your skin needs to be as free of germs as possible. You can reduce the number of germs on your skin by washing with CHG (chlorhexidine  gluconate) soap before surgery. CHG is an antiseptic soap that kills germs and continues to kill germs even after washing.   DO NOT use if you have an allergy to chlorhexidine /CHG or antibacterial soaps. If  your skin becomes reddened or irritated, stop using the CHG and notify one of our RNs at [phone].              TAKE A SHOWER THE NIGHT BEFORE SURGERY AND THE DAY OF SURGERY    Please keep in mind the following:  DO NOT shave, including legs and underarms, 48 hours prior to surgery.   You may shave your face before/day of surgery.  Place clean sheets on your bed the night before surgery Use a clean washcloth (not used since being washed) for each shower. DO NOT sleep with pet's night before surgery.  CHG Shower Instructions:  Wash your  face and private area with normal soap. If you choose to wash your hair, wash first with your normal shampoo.  After you use shampoo/soap, rinse your hair and body thoroughly to remove shampoo/soap residue.  Turn the water OFF and apply half the bottle of CHG soap to a CLEAN washcloth.  Apply CHG soap ONLY FROM YOUR NECK DOWN TO YOUR TOES (washing for 3-5 minutes)  DO NOT use CHG soap on face, private areas, open wounds, or sores.  Pay special attention to the area where your surgery is being performed.  If you are having back surgery, having someone wash your back for you may be helpful. Wait 2 minutes after CHG soap is applied, then you may rinse off the CHG soap.  Pat dry with a clean towel  Put on clean pajamas    Additional instructions for the day of surgery: DO NOT APPLY any lotions, deodorants, cologne, or perfumes.   Do not wear jewelry or makeup Do not wear nail polish, gel polish, artificial nails, or any other type of covering on natural nails (fingers and toes) Do not bring valuables to the hospital. [facility] is not responsible for valuables/personal belongings. Put on clean/comfortable clothes.  Please brush your teeth.  Ask your nurse before applying any prescription medications to the skin.

## 2024-03-02 ENCOUNTER — Other Ambulatory Visit: Payer: Self-pay

## 2024-03-02 ENCOUNTER — Encounter (HOSPITAL_COMMUNITY): Payer: Self-pay

## 2024-03-02 ENCOUNTER — Encounter (HOSPITAL_COMMUNITY)
Admission: RE | Admit: 2024-03-02 | Discharge: 2024-03-02 | Disposition: A | Discharge status: Home / Self Care | Source: Ambulatory Visit | Attending: General Surgery | Admitting: General Surgery

## 2024-03-02 VITALS — BP 137/56 | HR 59 | Temp 98.3°F | Resp 17 | Ht 67.0 in | Wt 157.7 lb

## 2024-03-02 DIAGNOSIS — Z01812 Encounter for preprocedural laboratory examination: Secondary | ICD-10-CM | POA: Insufficient documentation

## 2024-03-02 DIAGNOSIS — E119 Type 2 diabetes mellitus without complications: Secondary | ICD-10-CM | POA: Diagnosis not present

## 2024-03-02 DIAGNOSIS — I2510 Atherosclerotic heart disease of native coronary artery without angina pectoris: Secondary | ICD-10-CM | POA: Diagnosis not present

## 2024-03-02 DIAGNOSIS — Z7902 Long term (current) use of antithrombotics/antiplatelets: Secondary | ICD-10-CM | POA: Diagnosis not present

## 2024-03-02 DIAGNOSIS — Z8673 Personal history of transient ischemic attack (TIA), and cerebral infarction without residual deficits: Secondary | ICD-10-CM | POA: Diagnosis not present

## 2024-03-02 DIAGNOSIS — E785 Hyperlipidemia, unspecified: Secondary | ICD-10-CM | POA: Diagnosis not present

## 2024-03-02 DIAGNOSIS — I10 Essential (primary) hypertension: Secondary | ICD-10-CM | POA: Diagnosis not present

## 2024-03-02 HISTORY — DX: Headache, unspecified: R51.9

## 2024-03-02 LAB — GLUCOSE, CAPILLARY: Glucose-Capillary: 104 mg/dL — ABNORMAL HIGH (ref 70–99)

## 2024-03-02 LAB — BASIC METABOLIC PANEL WITH GFR
Anion gap: 10 (ref 5–15)
BUN: 21 mg/dL (ref 8–23)
CO2: 29 mmol/L (ref 22–32)
Calcium: 9.3 mg/dL (ref 8.9–10.3)
Chloride: 101 mmol/L (ref 98–111)
Creatinine, Ser: 0.87 mg/dL (ref 0.44–1.00)
GFR, Estimated: >60 mL/min (ref >60)
Glucose, Bld: 95 mg/dL (ref 70–99)
Potassium: 4.3 mmol/L (ref 3.5–5.1)
Sodium: 140 mmol/L (ref 135–145)

## 2024-03-02 LAB — HEMOGLOBIN A1C
Hgb A1c MFr Bld: 5 % (ref 4.8–5.6)
Mean Plasma Glucose: 96.8 mg/dL

## 2024-03-02 LAB — CBC
HCT: 36 % (ref 36.0–46.0)
Hemoglobin: 11.6 g/dL — ABNORMAL LOW (ref 12.0–15.0)
MCH: 29 pg (ref 26.0–34.0)
MCHC: 32.2 g/dL (ref 30.0–36.0)
MCV: 90 fL (ref 80.0–100.0)
Platelets: 170 10*3/uL (ref 150–400)
RBC: 4 MIL/uL (ref 3.87–5.11)
RDW: 13.5 % (ref 11.5–15.5)
WBC: 5.1 10*3/uL (ref 4.0–10.5)
nRBC: 0 % (ref 0.0–0.2)

## 2024-03-02 NOTE — Progress Notes (Signed)
 PCP - Sharry Deem, FNP Cardiologist - Dr. Avery Bodo - Last office visit 02/07/2024 with PRN follow-up  PPM/ICD - Denies Device Orders - n/a Rep Notified - n/a  Chest x-ray - n/a EKG - 02/07/2024 Stress Test - 10/07/2014 ECHO - 07/17/2017 Cardiac Cath - Denies  Sleep Study - Denies CPAP - n/a  Pt is DM2. She does not take any medications and is diet controlled. She seldom checks her blood sugar, but does have a meter at home. When she was checking it regularly, fasting blood sugars were around 120. CBG at pre-op 104. A1c result pending.  Last dose of GLP1 agonist- n/a GLP1 instructions: n/a  Blood Thinner Instructions: Pt instructed to hold Plavix  for 5 days. Last dose was today, June 6th. Aspirin  Instructions: n/a   ERAS Protcol - Clear liquids until 0800 morning of surgery PRE-SURGERY Ensure or G2- n/a  COVID TEST- n/a   Anesthesia review: Yes. PCP and Cardiac clearance. Hx of HTN, CVA, DM2.   Patient denies shortness of breath, fever, cough and chest pain at PAT appointment. Pt denies any respiratory illness/infection in the last two months.   All instructions explained to the patient, with a verbal understanding of the material. Patient agrees to go over the instructions while at home for a better understanding. Patient also instructed to self quarantine after being tested for COVID-19. The opportunity to ask questions was provided.

## 2024-03-05 NOTE — Anesthesia Preprocedure Evaluation (Signed)
 Anesthesia Evaluation  Patient identified by MRN, date of birth, ID band Patient awake    Reviewed: Allergy & Precautions, NPO status , Patient's Chart, lab work & pertinent test results  History of Anesthesia Complications Negative for: history of anesthetic complications  Airway Mallampati: I  TM Distance: >3 FB Neck ROM: Full    Dental no notable dental hx. (+) Edentulous Upper, Edentulous Lower   Pulmonary neg pulmonary ROS   Pulmonary exam normal breath sounds clear to auscultation       Cardiovascular hypertension, Normal cardiovascular exam Rhythm:Regular Rate:Normal     Neuro/Psych CVA (on plavix  R>L)    GI/Hepatic ,GERD  Medicated and Controlled,,  Endo/Other  diabetes, Type 2    Renal/GU Lab Results      Component                Value               Date                           K                        4.3                 03/02/2024                CO2                      29                  03/02/2024                BUN                      21                  03/02/2024                CREATININE               0.87                03/02/2024                    Musculoskeletal  (+) Arthritis ,    Abdominal   Peds  Hematology  (+) Blood dyscrasia, anemia Lab Results      Component                Value               Date                      WBC                      5.1                 03/02/2024                HGB                      11.6 (L)            03/02/2024                HCT  36.0                03/02/2024                MCV                      90.0                03/02/2024                PLT                      170                 03/02/2024              Anesthesia Other Findings   Reproductive/Obstetrics                             Anesthesia Physical Anesthesia Plan  ASA: 3  Anesthesia Plan: General and Regional   Post-op Pain  Management: Ofirmev  IV (intra-op)*, Precedex, Regional block* and Minimal or no pain anticipated   Induction: Intravenous  PONV Risk Score and Plan: Propofol  infusion, TIVA, Treatment may vary due to age or medical condition and Ondansetron   Airway Management Planned: LMA  Additional Equipment: None  Intra-op Plan:   Post-operative Plan: Extubation in OR  Informed Consent: I have reviewed the patients History and Physical, chart, labs and discussed the procedure including the risks, benefits and alternatives for the proposed anesthesia with the patient or authorized representative who has indicated his/her understanding and acceptance.     Dental advisory given  Plan Discussed with: CRNA and Surgeon  Anesthesia Plan Comments: (L Pec block +LMA TIVA)        Anesthesia Quick Evaluation

## 2024-03-05 NOTE — Progress Notes (Signed)
 Anesthesia Chart Review:  83 year old female with pertinent history including prior CVA on Plavix , HTN, HLD, anemia, GERD, diet-controlled DM2.  Seen by cardiologist Dr. Alroy Aspen on 02/07/2024 for preop evaluation.  Per note, "Pre op cardiology eval:   she is very stable .  BP is well controlled.  No CP , no dyspnea . She is on plavix  due to a stroke. I am not able to comment on holding her Plavix . She will need to see her primary MD or neuro to comment on holding plavix . She did inform me that she does not take in every day .  Was not able to tell me how often she takes it."  She was advised to follow-up with cardiology on an as-needed basis.  Patient reports last dose Plavix  03/02/2024.  Preop labs reviewed, mild anemia with hemoglobin 11.6, otherwise WNL. A1c 5.0.  EKG 02/07/2024: NSR.  Rate 62. Low voltage QRS. LAFB. Cannot rule out Anterior infarct (cited on or before 27-Dec-2022). When compared with ECG of 27-Dec-2022 22:05, Questionable change in initial forces of Lateral leads. T wave inversion no longer evident in Inferior leads. No significant change since last tracing  TTE 07/17/2017: - Left ventricle: The cavity size was normal. Wall thickness was at    the upper limits of normal. Systolic function was normal. The    estimated ejection fraction was in the range of 55% to 60%. Wall    motion was normal; there were no regional wall motion    abnormalities. Doppler parameters are consistent with abnormal    left ventricular relaxation (grade 1 diastolic dysfunction).  - Aortic valve: Mildly calcified annulus. Trileaflet. There was    trivial regurgitation.  - Mitral valve: Mildly calcified annulus. There was mild    regurgitation.  - Right atrium: Central venous pressure (est): 3 mm Hg.  - Atrial septum: No defect or patent foramen ovale was identified.  - Tricuspid valve: There was mild regurgitation.  - Pulmonary arteries: PA peak pressure: 28 mm Hg (S).  - Pericardium, extracardiac:  There was no pericardial effusion.   Impressions:   - Upper normal LV wall thickness with LVEF 55-60% and grade 1    diastolic dysfunction. Mildly calcified mitral annulus with mild    mitral regurgitation. Trivial aortic regurgitation. Mild    tricuspid regurgitation with PASP 28 mmHg. No obvious PFO or ASD.   Nuclear stress 10/07/2014: Impression: 1.  No definite scintigraphic evidence of vasodilator induced myocardial ischemia.  The slightly worsened perfusion in the anterior segments on stress compared to rest is favored to be secondary to shifting breast attenuation.  This does limit evaluation for ischemia within the anterior wall. 2.  Normal left ventricular wall motion and wall thickening. 3.  Normal visually estimated left ventricular function with a calculated ejection fraction of 86%. 4.  Focus of superficial radiotracer activity on or near the skin of the left breast.  This is not seen on the rest acquisition.  I favor this to represent radiotracer contamination on the scan, but correlation with mammogram is recommended to exclude underlying breast neoplasm.    Edilia Gordon [facility] Short Stay Center/Anesthesiology Phone [phone] 03/05/2024 1:32 PM

## 2024-03-06 ENCOUNTER — Ambulatory Visit
Admission: RE | Admit: 2024-03-06 | Discharge: 2024-03-06 | Disposition: A | Discharge status: Home / Self Care | Source: Ambulatory Visit | Attending: General Surgery | Admitting: General Surgery

## 2024-03-06 ENCOUNTER — Other Ambulatory Visit: Payer: Self-pay | Admitting: General Surgery

## 2024-03-06 DIAGNOSIS — C50812 Malignant neoplasm of overlapping sites of left female breast: Secondary | ICD-10-CM

## 2024-03-06 HISTORY — PX: BREAST BIOPSY: SHX20

## 2024-03-08 ENCOUNTER — Ambulatory Visit (HOSPITAL_COMMUNITY)
Admission: RE | Admit: 2024-03-08 | Discharge: 2024-03-08 | Disposition: A | Discharge status: Home / Self Care | Attending: General Surgery | Admitting: General Surgery

## 2024-03-08 ENCOUNTER — Ambulatory Visit (HOSPITAL_COMMUNITY): Payer: Self-pay | Admitting: Physician Assistant

## 2024-03-08 ENCOUNTER — Encounter (HOSPITAL_COMMUNITY)
Admission: RE | Disposition: A | Discharge status: Home / Self Care | Payer: Self-pay | Source: Home / Self Care | Attending: General Surgery

## 2024-03-08 ENCOUNTER — Other Ambulatory Visit: Payer: Self-pay

## 2024-03-08 ENCOUNTER — Ambulatory Visit
Admission: RE | Admit: 2024-03-08 | Discharge: 2024-03-08 | Disposition: A | Discharge status: Home / Self Care | Source: Ambulatory Visit | Attending: General Surgery | Admitting: General Surgery

## 2024-03-08 ENCOUNTER — Ambulatory Visit (HOSPITAL_COMMUNITY): Admitting: Anesthesiology

## 2024-03-08 ENCOUNTER — Encounter (HOSPITAL_COMMUNITY): Payer: Self-pay | Admitting: General Surgery

## 2024-03-08 DIAGNOSIS — K219 Gastro-esophageal reflux disease without esophagitis: Secondary | ICD-10-CM | POA: Insufficient documentation

## 2024-03-08 DIAGNOSIS — C773 Secondary and unspecified malignant neoplasm of axilla and upper limb lymph nodes: Secondary | ICD-10-CM

## 2024-03-08 DIAGNOSIS — Z1732 Human epidermal growth factor receptor 2 negative status: Secondary | ICD-10-CM | POA: Diagnosis not present

## 2024-03-08 DIAGNOSIS — E119 Type 2 diabetes mellitus without complications: Secondary | ICD-10-CM | POA: Diagnosis not present

## 2024-03-08 DIAGNOSIS — Z1722 Progesterone receptor negative status: Secondary | ICD-10-CM | POA: Insufficient documentation

## 2024-03-08 DIAGNOSIS — Z7902 Long term (current) use of antithrombotics/antiplatelets: Secondary | ICD-10-CM | POA: Insufficient documentation

## 2024-03-08 DIAGNOSIS — C50412 Malignant neoplasm of upper-outer quadrant of left female breast: Secondary | ICD-10-CM | POA: Insufficient documentation

## 2024-03-08 DIAGNOSIS — C50812 Malignant neoplasm of overlapping sites of left female breast: Secondary | ICD-10-CM

## 2024-03-08 DIAGNOSIS — I10 Essential (primary) hypertension: Secondary | ICD-10-CM | POA: Insufficient documentation

## 2024-03-08 DIAGNOSIS — C50912 Malignant neoplasm of unspecified site of left female breast: Secondary | ICD-10-CM | POA: Diagnosis present

## 2024-03-08 DIAGNOSIS — Z170 Estrogen receptor positive status [ER+]: Secondary | ICD-10-CM | POA: Diagnosis not present

## 2024-03-08 HISTORY — PX: BREAST LUMPECTOMY: SHX2

## 2024-03-08 HISTORY — PX: BREAST LUMPECTOMY WITH RADIOACTIVE SEED LOCALIZATION: SHX6424

## 2024-03-08 HISTORY — PX: AXILLARY SENTINEL NODE BIOPSY: SHX5738

## 2024-03-08 LAB — GLUCOSE, CAPILLARY
Glucose-Capillary: 75 mg/dL (ref 70–99)
Glucose-Capillary: 71 mg/dL (ref 70–99)
Glucose-Capillary: 84 mg/dL (ref 70–99)

## 2024-03-08 SURGERY — BREAST LUMPECTOMY
Anesthesia: Regional | Site: Breast | Laterality: Left

## 2024-03-08 MED ORDER — EPHEDRINE 5 MG/ML INJ
INTRAVENOUS | Status: AC
  Filled 2024-03-08: qty 5

## 2024-03-08 MED ORDER — PROPOFOL 10 MG/ML IV BOLUS
INTRAVENOUS | Status: AC
  Filled 2024-03-08: qty 20

## 2024-03-08 MED ORDER — OXYCODONE HCL 5 MG PO TABS: 2.5000 mg | ORAL_TABLET | Freq: Four times a day (QID) | ORAL | 0 refills | Status: AC | PRN

## 2024-03-08 MED ORDER — ACETAMINOPHEN 500 MG PO TABS: 1000.0000 mg | ORAL_TABLET | ORAL | Status: AC

## 2024-03-08 MED ORDER — CEFAZOLIN SODIUM-DEXTROSE 2-4 GM/100ML-% IV SOLN
2.0000 g | INTRAVENOUS | Status: AC
  Administered 2024-03-08: 2 g via INTRAVENOUS

## 2024-03-08 MED ORDER — FENTANYL CITRATE PF 50 MCG/ML IJ SOSY
50.0000 ug | PREFILLED_SYRINGE | Freq: Once | INTRAMUSCULAR | Status: AC
  Administered 2024-03-08: 50 ug via INTRAVENOUS
  Filled 2024-03-08: qty 1

## 2024-03-08 MED ORDER — PROPOFOL 500 MG/50ML IV EMUL
INTRAVENOUS | Status: DC | PRN
Start: 2024-03-08 — End: 2024-03-08
  Administered 2024-03-08: 75 ug/kg/min via INTRAVENOUS

## 2024-03-08 MED ORDER — PHENYLEPHRINE HCL-NACL 20-0.9 MG/250ML-% IV SOLN
INTRAVENOUS | Status: DC | PRN
  Administered 2024-03-08: 30 ug/min via INTRAVENOUS
  Administered 2024-03-08: 15 ug/min via INTRAVENOUS

## 2024-03-08 MED ORDER — BUPIVACAINE LIPOSOME 1.3 % IJ SUSP
INTRAMUSCULAR | Status: DC | PRN
  Administered 2024-03-08: 10 mL

## 2024-03-08 MED ORDER — FENTANYL CITRATE (PF) 250 MCG/5ML IJ SOLN
INTRAMUSCULAR | Status: AC
  Filled 2024-03-08: qty 5

## 2024-03-08 MED ORDER — PROPOFOL 10 MG/ML IV BOLUS
INTRAVENOUS | Status: AC
Start: 2024-03-08 — End: 2024-03-08
  Filled 2024-03-08: qty 20

## 2024-03-08 MED ORDER — ROCURONIUM BROMIDE 10 MG/ML (PF) SYRINGE
PREFILLED_SYRINGE | INTRAVENOUS | Status: AC
Start: 2024-03-08 — End: 2024-03-08
  Filled 2024-03-08: qty 20

## 2024-03-08 MED ORDER — EPHEDRINE SULFATE-NACL 50-0.9 MG/10ML-% IV SOSY
PREFILLED_SYRINGE | INTRAVENOUS | Status: DC | PRN
  Administered 2024-03-08: 5 mg via INTRAVENOUS

## 2024-03-08 MED ORDER — BUPIVACAINE HCL (PF) 0.5 % IJ SOLN
INTRAMUSCULAR | Status: DC | PRN
  Administered 2024-03-08: 15 mL

## 2024-03-08 MED ORDER — DEXAMETHASONE SODIUM PHOSPHATE 10 MG/ML IJ SOLN
INTRAMUSCULAR | Status: AC
  Filled 2024-03-08: qty 3

## 2024-03-08 MED ORDER — FENTANYL CITRATE (PF) 250 MCG/5ML IJ SOLN
INTRAMUSCULAR | Status: DC | PRN
  Administered 2024-03-08 (×2): 50 ug via INTRAVENOUS

## 2024-03-08 MED ORDER — LABETALOL HCL 5 MG/ML IV SOLN
INTRAVENOUS | Status: AC
  Filled 2024-03-08: qty 4

## 2024-03-08 MED ORDER — BUPIVACAINE-EPINEPHRINE (PF) 0.25% -1:200000 IJ SOLN
INTRAMUSCULAR | Status: AC
  Filled 2024-03-08: qty 30

## 2024-03-08 MED ORDER — CHLORHEXIDINE GLUCONATE 0.12 % MT SOLN
OROMUCOSAL | Status: AC
  Administered 2024-03-08: 15 mL via OROMUCOSAL
  Filled 2024-03-08: qty 15

## 2024-03-08 MED ORDER — METOPROLOL SUCCINATE ER 25 MG PO TB24
25.0000 mg | ORAL_TABLET | Freq: Once | ORAL | Status: AC
  Administered 2024-03-08: 25 mg via ORAL
  Filled 2024-03-08: qty 1

## 2024-03-08 MED ORDER — ONDANSETRON HCL 4 MG/2ML IJ SOLN
INTRAMUSCULAR | Status: DC | PRN
  Administered 2024-03-08: 4 mg via INTRAVENOUS

## 2024-03-08 MED ORDER — LACTATED RINGERS IV SOLN: INTRAVENOUS | Status: DC

## 2024-03-08 MED ORDER — INSULIN ASPART 100 UNIT/ML IJ SOLN: 0.0000 [IU] | INTRAMUSCULAR | Status: DC | PRN

## 2024-03-08 MED ORDER — FENTANYL CITRATE (PF) 100 MCG/2ML IJ SOLN
INTRAMUSCULAR | Status: AC
  Filled 2024-03-08: qty 2

## 2024-03-08 MED ORDER — ACETAMINOPHEN 500 MG PO TABS
ORAL_TABLET | ORAL | Status: AC
  Administered 2024-03-08: 1000 mg via ORAL
  Filled 2024-03-08: qty 2

## 2024-03-08 MED ORDER — PROPOFOL 10 MG/ML IV BOLUS
INTRAVENOUS | Status: DC | PRN
  Administered 2024-03-08: 30 mg via INTRAVENOUS
  Administered 2024-03-08: 40 mg via INTRAVENOUS
  Administered 2024-03-08: 80 mg via INTRAVENOUS

## 2024-03-08 MED ORDER — 0.9 % SODIUM CHLORIDE (POUR BTL) OPTIME
TOPICAL | Status: DC | PRN
Start: 2024-03-08 — End: 2024-03-08
  Administered 2024-03-08: 1000 mL

## 2024-03-08 MED ORDER — LIDOCAINE 2% (20 MG/ML) 5 ML SYRINGE
INTRAMUSCULAR | Status: DC | PRN
  Administered 2024-03-08: 80 mg via INTRAVENOUS

## 2024-03-08 MED ORDER — CHLORHEXIDINE GLUCONATE 0.12 % MT SOLN: 15.0000 mL | Freq: Once | OROMUCOSAL | Status: AC

## 2024-03-08 MED ORDER — ORAL CARE MOUTH RINSE
15.0000 mL | Freq: Once | OROMUCOSAL | Status: AC
Start: 2024-03-08 — End: 2024-03-08

## 2024-03-08 MED ORDER — CHLORHEXIDINE GLUCONATE CLOTH 2 % EX PADS: 6.0000 | MEDICATED_PAD | Freq: Once | CUTANEOUS | Status: DC

## 2024-03-08 MED ORDER — LIDOCAINE 2% (20 MG/ML) 5 ML SYRINGE
INTRAMUSCULAR | Status: AC
  Filled 2024-03-08: qty 15

## 2024-03-08 MED ORDER — CEFAZOLIN SODIUM-DEXTROSE 2-4 GM/100ML-% IV SOLN
INTRAVENOUS | Status: AC
  Filled 2024-03-08: qty 100

## 2024-03-08 MED ORDER — ONDANSETRON HCL 4 MG/2ML IJ SOLN
INTRAMUSCULAR | Status: AC
Start: 2024-03-08 — End: 2024-03-08
  Filled 2024-03-08: qty 6

## 2024-03-08 MED ORDER — LIDOCAINE HCL 1 % IJ SOLN
INTRAMUSCULAR | Status: AC
  Filled 2024-03-08: qty 20

## 2024-03-08 SURGICAL SUPPLY — 37 items
BAG COUNTER SPONGE SURGICOUNT (BAG) ×2 IMPLANT
BINDER BREAST LRG (GAUZE/BANDAGES/DRESSINGS) IMPLANT
BINDER BREAST XLRG (GAUZE/BANDAGES/DRESSINGS) IMPLANT
CANISTER SUCTION 3000ML PPV (SUCTIONS) IMPLANT
CHLORAPREP W/TINT 26 (MISCELLANEOUS) ×2 IMPLANT
CLIP TI LARGE 6 (CLIP) ×2 IMPLANT
COVER PROBE W GEL 5X96 (DRAPES) ×2 IMPLANT
COVER SURGICAL LIGHT HANDLE (MISCELLANEOUS) ×2 IMPLANT
DERMABOND ADVANCED .7 DNX12 (GAUZE/BANDAGES/DRESSINGS) ×2 IMPLANT
DEVICE DUBIN SPECIMEN MAMMOGRA (MISCELLANEOUS) ×2 IMPLANT
DRAPE CHEST BREAST 15X10 FENES (DRAPES) ×2 IMPLANT
ELECT COATED BLADE 2.86 ST (ELECTRODE) ×2 IMPLANT
ELECTRODE REM PT RTRN 9FT ADLT (ELECTROSURGICAL) ×2 IMPLANT
GAUZE PAD ABD 8X10 STRL (GAUZE/BANDAGES/DRESSINGS) ×2 IMPLANT
GAUZE SPONGE 4X4 12PLY STRL (GAUZE/BANDAGES/DRESSINGS) IMPLANT
GAUZE SPONGE 4X4 12PLY STRL LF (GAUZE/BANDAGES/DRESSINGS) ×2 IMPLANT
GLOVE BIO SURGEON STRL SZ 6 (GLOVE) ×2 IMPLANT
GLOVE INDICATOR 6.5 STRL GRN (GLOVE) ×2 IMPLANT
GOWN STRL REUS W/ TWL LRG LVL3 (GOWN DISPOSABLE) ×2 IMPLANT
GOWN STRL REUS W/ TWL XL LVL3 (GOWN DISPOSABLE) ×2 IMPLANT
KIT BASIN OR (CUSTOM PROCEDURE TRAY) ×2 IMPLANT
KIT MARKER MARGIN INK (KITS) ×2 IMPLANT
LIGHT WAVEGUIDE WIDE FLAT (MISCELLANEOUS) IMPLANT
NDL HYPO 25GX1X1/2 BEV (NEEDLE) ×2 IMPLANT
NEEDLE HYPO 25GX1X1/2 BEV (NEEDLE) ×2 IMPLANT
NS IRRIG 1000ML POUR BTL (IV SOLUTION) IMPLANT
PACK GENERAL/GYN (CUSTOM PROCEDURE TRAY) ×2 IMPLANT
STRIP CLOSURE SKIN 1/2X4 (GAUZE/BANDAGES/DRESSINGS) ×2 IMPLANT
STRIP CLOSURE SKIN 1/4X3 (GAUZE/BANDAGES/DRESSINGS) IMPLANT
STRIP CLOSURE SKIN 1/4X4 (GAUZE/BANDAGES/DRESSINGS) IMPLANT
SUT MNCRL AB 4-0 PS2 18 (SUTURE) ×2 IMPLANT
SUT SILK 2 0 SH (SUTURE) IMPLANT
SUT VIC AB 2-0 SH 27XBRD (SUTURE) IMPLANT
SUT VIC AB 3-0 SH 27X BRD (SUTURE) ×2 IMPLANT
SYR CONTROL 10ML LL (SYRINGE) ×2 IMPLANT
TOWEL GREEN STERILE (TOWEL DISPOSABLE) ×2 IMPLANT
TOWEL GREEN STERILE FF (TOWEL DISPOSABLE) ×2 IMPLANT

## 2024-03-08 NOTE — Progress Notes (Signed)
 Patient placed in phase 2 at this time, awaiting ride.

## 2024-03-08 NOTE — Interval H&P Note (Signed)
 History and Physical Interval Note:  03/08/2024 11:15 AM  Kristi David  has presented today for surgery, with the diagnosis of LEFT BREAST CANCER.  The various methods of treatment have been discussed with the patient and family. After consideration of risks, benefits and other options for treatment, the patient has consented to  Procedure(s) with comments: BREAST LUMPECTOMY (Left) - LEFT BREAST CENTRAL LUMPECTOMY BREAST LUMPECTOMY WITH RADIOACTIVE SEED LOCALIZATION (Left) - LEFT BREAST SEED LOCALIZED LUMPECTOMY BIOPSY, LYMPH NODE, SENTINEL, AXILLARY (Left) - LEFT AXILLARY SEED LOCALIZED LYMPH NODE EXCISION as a surgical intervention.  The patient's history has been reviewed, patient examined, no change in status, stable for surgery.  I have reviewed the patient's chart and labs.  Questions were answered to the patient's satisfaction.     [name]

## 2024-03-08 NOTE — Transfer of Care (Signed)
 Immediate Anesthesia Transfer of Care Note  Patient: Kristi David  Procedure(s) Performed: BREAST LUMPECTOMY (Left: Breast) BREAST LUMPECTOMY WITH RADIOACTIVE SEED LOCALIZATION (Left: Breast) BIOPSY, LYMPH NODE, SENTINEL, AXILLARY (Left)  Patient Location: PACU  Anesthesia Type:General  Level of Consciousness: drowsy and patient cooperative  Airway & Oxygen Therapy: Patient Spontanous Breathing  Post-op Assessment: Report given to RN, Post -op Vital signs reviewed and stable, and Patient moving all extremities X 4  Post vital signs: Reviewed and stable  Last Vitals:  Vitals Value Taken Time  BP 154/68 03/08/24 13:31  Temp 36.1 C 03/08/24 13:31  Pulse 64 03/08/24 13:33  Resp 13 03/08/24 13:33  SpO2 97 % 03/08/24 13:33  Vitals shown include unfiled device data.  Last Pain:  Vitals:   03/08/24 0919  TempSrc:   PainSc: 0-No pain         Complications: No notable events documented.

## 2024-03-08 NOTE — Discharge Instructions (Addendum)

## 2024-03-08 NOTE — H&P (Signed)
 REFERRING PHYSICIAN: Maryalice Smaller  PROVIDER: Eppie Hasting, MD  Care Team: Patient Care Team: Arnie Lao, NP as PCP - General (Family Medicine) Eppie Hasting, MD as Consulting Provider (Surgical Oncology) Sonja [place], MD (Hematology and Oncology) Aaron Aas, MD (Radiation Oncology)   MRN: [id number] DOB: 12/26/1940 DATE OF ENCOUNTER: 01/23/2024  Subjective   Chief Complaint: Left breast cancer   History of Present Illness: Kristi David is a 83 y.o. female who is seen today as an office consultation at the request of Dr. Maryalice Smaller for evaluation of Left breast cancer .   Patient presents with a new breast cancer diagnosed April 2025. This was noticed by the patient's son whom she lives with. The patient has a lesion on the nipple. Was brought to the attention of the primary care provider who performed a punch biopsy of it. I do not have a copy of this pathology report, but it reportedly demonstrated a mucinous carcinoma. The patient subsequently had diagnostic imaging. Diagnostic imaging showed a mass involving the entire nipple as well as calcifications measuring 8 mm in the subareolar location, an isodense mass in the upper outer quadrant approximately 4 mm and an abnormal lymph node in the axilla with a loss of fatty hilum. The mass in the upper outer quadrant and the lymph node were both biopsied. These demonstrated grade 2 invasive ductal carcinoma. Prognostic panel showed ER positive, PR negative, HER2 negative, Ki-67 was 10%. The patient's last mammogram was in 2021 and it was normal.  Of note, the patient is on Plavix  for history of a stroke 2018. The patient is relatively frail and uses a cane to get around. She has had 1 minor fall in the past several months.  Patient is accompanied by her son whom she lives with.  Family cancer history -sister had ovarian or uterine cancer and a different sister had gastric cancer  Work - n/a  Diagnostic  mammogram/ultrasound:01/04/24 [facility] ACR Breast Density Category b: There are scattered areas of fibroglandular density. FINDINGS: Full field CC and MLO views of both breasts, a spot compression CC view of the subareolar location, a spot magnification lateral view of the subareolar location, and spot compression CC and MLO views of a mammographic finding were obtained. RIGHT: No findings suspicious for malignancy. LEFT: Approximate 0.7-0.8 cm group of calcifications involving the subareolar location extending into the nipple, many of which are in a linear orientation. No visible mass in the subareolar location. New isodense mass in the UPPER OUTER QUADRANT at middle depth measuring approximately 0.4 cm. The mass is most conspicuous on the spot CC view, and is likely compressible on the spot MLO view. No associated architectural distortion or suspicious calcifications. Targeted ultrasound is performed, demonstrating the mass involving the entire nipple. There is no mass or visible microcalcifications in the subareolar location that would be accessible for needle biopsy. At 2 o'clock 5 cm from the nipple there is a superficial hypoechoic mass with mildly irregular margins measuring 0.3 cm diameter, demonstrating mixed posterior characteristics and no internal power Doppler flow, corresponding to the mammographic mass. Sonographic evaluation of the axilla demonstrates a solitary abnormal lymph node with near complete loss of the fatty hilum inferolaterally. On correlative physical examination, there is a fungating mass involving the entire LEFT nipple which is pink to red in color. IMPRESSION: 1. Indeterminate 0.3 cm mass in the UPPER OUTER QUADRANT of the LEFT breast at 2 o'clock 5 cm from the nipple. 2. Solitary pathologic inferolateral LEFT axillary  lymph node with near complete loss of the hilum. 3. Fungating mass involving the LEFT nipple on physical examination. Suspicious  calcifications involving the LEFT nipple. 4. No mammographic evidence of malignancy involving the RIGHT breast. BI-RADS CATEGORY 4: Suspicious.   Pathology core needle biopsy: 01/05/2024 1. Breast, left, needle core biopsy, (A) 2 o'clock, 5cmfn : INVASIVE DUCTAL CARCINOMA DUCTAL CARCINOMA IN SITU, CRIBRIFORM, INTERMEDIATE NUCLEAR GRADE TUBULE FORMATION: SCORE 3 NUCLEAR PLEOMORPHISM: SCORE 2 MITOTIC COUNT: SCORE 1 TOTAL SCORE: 6 OVERALL GRADE: 2 LYMPHOVASCULAR INVASION: NOT IDENTIFIED CANCER LENGTH: 3.5 MM CALCIFICATIONS: NOT IDENTIFIED OTHER FINDINGS: NONE SEE NOTE  2. Lymph node, needle/core biopsy, (B) left axilla : INVASIVE DUCTAL CARCINOMA. NO LYMPHOID TISSUE IDENTIFIED.  Receptors: Estrogen Receptor: 100%, POSITIVE, STRONG STAINING INTENSITY  Progesterone Receptor: 0%, NEGATIVE  Proliferation Marker Ki67: 10%  GROUP 5: HER2 **NEGATIVE**   Review of Systems: A complete review of systems was obtained from the patient. I have reviewed this information and discussed as appropriate with the patient. See HPI as well for other ROS. ROS o/w negative  Medical History: Past Medical History:  Diagnosis Date  Anemia  Arthritis  Diabetes mellitus without complication (CMS/HHS-HCC)  GERD (gastroesophageal reflux disease)  History of stroke  Hyperlipidemia  Hypertension   Patient Active Problem List  Diagnosis  Cancer of overlapping sites of left breast (CMS/HHS-HCC)  Breast cancer metastasized to axillary lymph node, left (CMS/HHS-HCC)  Platelet inhibition due to Plavix   H/O: CVA (cerebrovascular accident)   Past Surgical History:  Procedure Laterality Date  RIGHT UNICOMPARTMENTAL KNEE ARTHROPLASTY 01/06/2016  Dr. Jannelle Memory  Bunionectomy  CATARACT EXTRACTION  HERNIA REPAIR  JOINT REPLACEMENT    Allergies  Allergen Reactions  Origanum Oil Other (See Comments)  Congestion, stuffy nose  Peanut Other (See Comments)  Congestion, stuffy nose Can eat cooked  peanuts if pre-med with benadryl   Congestion, stuffy nose, Can eat cooked peanuts if pre-med with benadryl   Tomato Other (See Comments)  Congestion, stuffy nose   Current Outpatient Medications on File Prior to Visit  Medication Sig Dispense Refill  amLODIPine (NORVASC) 2.5 MG tablet 1 tablet  clopidogreL  (PLAVIX ) 75 mg tablet Take 75 mg by mouth once daily  escitalopram oxalate (LEXAPRO) 10 MG tablet Take 10 mg by mouth once daily  gabapentin  (NEURONTIN ) 600 MG tablet Take 600 mg by mouth every 12 (twelve) hours  hydroCHLOROthiazide (MICROZIDE) 12.5 mg capsule TAKE 1 CAPSULE BY MOUTH EVERY DAY IN THE MORNING FOR 90 DAYS  hyoscyamine  (LEVSIN) 0.125 mg tablet Take 125 mcg by mouth 3 (three) times daily as needed  metoprolol  SUCCinate (TOPROL -XL) 50 MG XL tablet Take 50 mg by mouth once daily  netarsudiL (RHOPRESSA) 0.02 % eye drops INSTILL 1 DROP INTO BOTH EYES AS DIRECTED  oxyBUTYnin (DITROPAN-XL) 10 MG XL tablet 2 tablets  traMADoL  (ULTRAM ) 50 mg tablet Take 50 mg by mouth every 6 (six) hours as needed  blood glucose diagnostic (CONTOUR NEXT TEST STRIPS) test strip  blood-glucose meter Misc  losartan  (COZAAR ) 100 MG tablet Take 100 mg by mouth once daily  solifenacin (VESICARE) 10 MG tablet Take 10 mg by mouth   No current facility-administered medications on file prior to visit.   Family History  Problem Relation Age of Onset  Stroke Mother  Coronary Artery Disease (Blocked arteries around heart) Father  Ovarian cancer Sister    Social History   Tobacco Use  Smoking Status Never  Smokeless Tobacco Never    Social History   Socioeconomic History  Marital status: Divorced  Tobacco Use  Smoking status: Never  Smokeless tobacco: Never  Substance and Sexual Activity  Alcohol use: Never  Drug use: Never   Social Drivers of Health   Food Insecurity: No Food Insecurity (01/19/2024)  Received from [facility]  Hunger Vital Sign  Worried About Running Out of Food in the  Last Year: Never true  Ran Out of Food in the Last Year: Never true  Transportation Needs: No Transportation Needs (01/19/2024)  [facility] - [facility]  Lack of Transportation (Medical): No  Lack of Transportation (Non-Medical): No  Housing Stability: Unknown (01/23/2024)  Housing Stability Vital Sign  Homeless in the Last Year: No  Recent Concern: Housing Stability - High Risk (01/19/2024)  Received from [facility] Stability Vital Sign  Unable to Pay for Housing in the Last Year: No  Number of Times Moved in the Last Year: 2  Homeless in the Last Year: No   Objective:   Vitals:  01/23/24 1203  BP: (!) 146/78  Pulse: 64  Temp: 36.4 C (97.6 F)  SpO2: 98%  Weight: 72.5 kg (159 lb 12.8 oz)  Height: 170.2 cm (5' 7)  PainSc: 0-No pain   Body mass index is 25.03 kg/m.  Gen: No acute distress. Well nourished and well groomed. Patient is talkative interacts appropriately Neurological: Alert and oriented to person, place, and time. Coordination normal.  Head: Normocephalic and atraumatic.  Eyes: Conjunctivae are normal. Pupils are equal, round, and reactive to light. No scleral icterus.  Neck: Normal range of motion. Neck supple. No tracheal deviation or thyromegaly present.  Cardiovascular: Normal rate, regular rhythm, normal heart sounds and intact distal pulses. Exam reveals no gallop and no friction rub. No murmur heard. Breast: Left breast has an open wound with a small fungating mass. Approximately 3 cm in diameter and is just a little smaller than the areola. There is some crusting and bleeding. It does not extend too deeply. There is some density in the upper outer quadrant that corresponds to the approximate location of the mass, but is not definitively palpable. There is no palpable adenopathy in the axilla. The breasts are ptotic. The left breast is slightly contracted compared to the right. Respiratory: Effort normal. No respiratory  distress. No chest wall tenderness. Breath sounds normal. No wheezes, rales or rhonchi.  GI: Soft. Bowel sounds are normal. The abdomen is soft and nontender. There is no rebound and no guarding.  Musculoskeletal: Normal range of motion. Extremities are nontender.  Lymphadenopathy: No cervical, preauricular, postauricular or axillary adenopathy is present Skin: Skin is warm and dry. No rash noted. No diaphoresis. No erythema. No pallor. No clubbing, cyanosis, or edema.  Psychiatric: Normal mood and affect. Behavior is normal. Judgment and thought content normal.   Labs Labs performed on January 12, 2024 show a normal CBC, normal c-Met, and normal cancer antigen 27.29  Assessment and Plan:   ICD-10-CM  1. Breast cancer metastasized to axillary lymph node, left (CMS/HHS-HCC) C50.912 Ambulatory Referral to Physical Therapy  C77.3   2. Cancer of overlapping sites of left breast (CMS/HHS-HCC) C50.812 Ambulatory Referral to Physical Therapy   3. Platelet inhibition due to Plavix  Z79.02   4. H/O: CVA (cerebrovascular accident) Z86.73    Patient has multifocal left breast cancer. Patient is set up for staging scans which I agree with. She is relatively frail. She has high risk of bleeding due to the Plavix . I think it is reasonable to just take out the areas  of cancer rather than a mastectomy given her frailty. A mastectomy would certainly be a much higher risk of bleeding in this patient. Patient is not a candidate for chemotherapy. I think she would benefit from radiation. I will plan to do 2 lumpectomies and take out the disease lymph node. Should she have metastatic disease, I will plan to just do a central lumpectomy with the fungating mass. I do think this will become a wound problem regardless of whether metastatic disease is present and think this needs to be removed in any event.  I reviewed the risk of surgery including stroke, bleeding, infection, wound breakdown, possible cancer  recurrence, possible need for additional surgeries. I discussed possible heart problems. I reviewed that the patient may need to see cardiology or neurology back prior to surgery. We will plan this once all the information is back from the other studies.  This case required high-level of decision making and review of extensive medical records.  No follow-ups on file.  Deliliah Fender, MD FACS

## 2024-03-08 NOTE — Anesthesia Procedure Notes (Addendum)
 Anesthesia Regional Block: Pectoralis block   Pre-Anesthetic Checklist: , timeout performed,  Correct Patient, Correct Site, Correct Laterality,  Correct Procedure, Correct Position, site marked,  Risks and benefits discussed,  Surgical consent,  Pre-op evaluation,  At surgeon's request and post-op pain management  Laterality: Left and Upper  Prep: chloraprep       Needles:  Injection technique: Single-shot  Needle Type: Echogenic Needle     Needle Length: 9cm  Needle Gauge: 21     Additional Needles:   Procedures:,,,, ultrasound used (permanent image in chart),,    Narrative:  Start time: 03/08/2024 10:57 AM End time: 03/08/2024 11:03 AM Injection made incrementally with aspirations every 5 mL.  Performed by: Personally  Anesthesiologist: Rosalita Combe, MD  Additional Notes: Block assessed. Patient tolerated procedure well.

## 2024-03-08 NOTE — Op Note (Signed)
 Left Breast Radioactive seed localized lumpectomy, central lumpectomy, and seed targeted deep left axillary node biopsy Indications: This patient presents with history of left breast cancer, cT1aN1 grade 2 invasive ductal carcinoma, upper outer quadrant, ER+/PR-/Her2-,Ki 67 10%  Pre-operative Diagnosis: left breast cancer  Post-operative Diagnosis: Same  Surgeon: [name]   Assistant: Woodroe Hazel, RNFA  Anesthesia: General endotracheal anesthesia  ASA Class: 3  Procedure Details  The patient was seen in the Holding Room. The risks, benefits, complications, treatment options, and expected outcomes were discussed with the patient. The possibilities of bleeding, infection, the need for additional procedures, failure to diagnose a condition, and creating a complication requiring transfusion or operation were discussed with the patient. The patient concurred with the proposed plan, giving informed consent.  The site of surgery properly noted/marked. The patient was taken to Operating Room # 2, identified, and the procedure verified as left Breast central lumpectomy, left breast Seed localized Lumpectomy, left breast seed targeted lymph node biopsy. The left arm, breast, and chest were prepped and draped in standard fashion. A Time Out was held and the above information confirmed.   The areolar lesion was addressed first.  An elliptical incision was created with a #10 blade.  This incorporated the entire areola and the fungating lesion present.  Cautery was used to perform the dissection underneath the tissue.  Hemostasis was achieved with cautery.  One clip was placed at the deep margin.  The skin was reapproximated with 3-0 vicryl deep dermal interrupted sutures and running 4-0 monocryl subcuticular sutures.    The UOQ lesion and the node were addressed via the same incision.  This was made in the low axilla.    Dissection was carried down to around the point of maximum signal intensity in the  breast. The cautery was used to perform the dissection.  Hemostasis was achieved with cautery.   The specimen was inked with the margin marker paint kit.    Specimen radiography confirmed inclusion of the mammographic lesion, the clip, and the seed.  The background signal in the breast was zero.  There was some disruption to the breast tissue due to the fatty nature of it so additional margins were taken.  Additional margins were taken at the cardinal margin(s) other than the anterior margin which was skin. The wound was irrigated and reinspected for hemostasis.  The edges of the cavity were marked with large clips.    Via the same incision, dissection was carried through the clavipectoral fascia to locate the node with the seed.  This was located and the lymphovascular channels were clipped with hemaclips.    The clarix confirmed presence of the clip in the node.    The wound was irrigated.  Hemostasis was achieved with cautery.  The axillary incision was closed with a 3-0 vicryl deep dermal interrupted sutures and a 4-0 monocryl subcuticular closure.    Sterile dressings were applied. At the end of the operation, all sponge, instrument, and needle counts were correct.  Findings: grossly clear surgical margins and no adenopathy, with the UOQ lesion the anterior is skin and the posterior margin is pectoralis.   Estimated Blood Loss:  min         Specimens: central lumpectomy, UOQ left breast tissue with seed, additional superior, lateral, inferior, medial, and posterior margins, and left axillary node with seed.         Complications:  None; patient tolerated the procedure well.         Disposition:  PACU - hemodynamically stable.         Condition: stable

## 2024-03-08 NOTE — Anesthesia Procedure Notes (Signed)
 Procedure Name: Intubation Date/Time: 03/08/2024 11:36 AM  Performed by: Rochelle Chu, CRNAPre-anesthesia Checklist: Patient identified, Emergency Drugs available, Suction available and Patient being monitored Patient Re-evaluated:Patient Re-evaluated prior to induction Oxygen Delivery Method: Circle system utilized Preoxygenation: Pre-oxygenation with 100% oxygen Induction Type: IV induction LMA: LMA with gastric port inserted LMA Size: 4.0 Number of attempts: 1 Placement Confirmation: positive ETCO2, breath sounds checked- equal and bilateral and CO2 detector Tube secured with: Tape Dental Injury: Teeth and Oropharynx as per pre-operative assessment

## 2024-03-09 ENCOUNTER — Encounter (HOSPITAL_COMMUNITY): Payer: Self-pay | Admitting: General Surgery

## 2024-03-09 LAB — SURGICAL PATHOLOGY

## 2024-03-09 NOTE — Anesthesia Postprocedure Evaluation (Signed)
 Anesthesia Post Note  Patient: Kristi David  Procedure(s) Performed: BREAST LUMPECTOMY (Left: Breast) BREAST LUMPECTOMY WITH RADIOACTIVE SEED LOCALIZATION (Left: Breast) BIOPSY, LYMPH NODE, SENTINEL, AXILLARY (Left)     Patient location during evaluation: PACU Anesthesia Type: General Level of consciousness: awake and alert and oriented Pain management: pain level controlled Vital Signs Assessment: post-procedure vital signs reviewed and stable Respiratory status: spontaneous breathing, nonlabored ventilation and respiratory function stable Cardiovascular status: blood pressure returned to baseline and stable Postop Assessment: no apparent nausea or vomiting Anesthetic complications: no   No notable events documented.  Last Vitals:  Vitals:   03/08/24 1400 03/08/24 1415  BP: (!) 182/76 (!) 180/74  Pulse: (!) 48 (!) 49  Resp: 14 11  Temp:  36.4 C  SpO2: 97% 97%    Last Pain:  Vitals:   03/08/24 1330  TempSrc:   PainSc: Asleep                 Jazzmon Prindle A.

## 2024-03-12 NOTE — Telephone Encounter (Signed)
 Called and left a VM for patient to CB to schedule for gel injection.  Injection has been approved previously.

## 2024-03-12 NOTE — Telephone Encounter (Signed)
 Pt called and wants BIL-knee injections and wants to come sooner than is available. Can you please assist. [phone]

## 2024-03-12 NOTE — Telephone Encounter (Signed)
 Called patient on 03/01/24 to schedule for gel injection, but patient declined at the time.  Will call to schedule.

## 2024-03-16 ENCOUNTER — Ambulatory Visit: Payer: Self-pay | Admitting: General Surgery

## 2024-03-16 NOTE — Telephone Encounter (Signed)
 Left message

## 2024-03-19 NOTE — Telephone Encounter (Signed)
 Discussed pathology with patient's son.  There is a microscopically positive margin at the central lumpectomy.  Given patient's frailty and risk with holding blood thinners, will plan on NOT reexcising margin.  Her overall prognosis will likely be driven by 2 positive nodes. Patient has appt with oncology to discuss next steps.

## 2024-03-29 ENCOUNTER — Inpatient Hospital Stay: Attending: Hematology | Admitting: Hematology

## 2024-03-29 ENCOUNTER — Telehealth: Payer: Self-pay

## 2024-03-29 NOTE — Telephone Encounter (Signed)
 Patient did not arrive to her office visit today. Attempted to contact the patient and the patient's son. Unable to reach the patient. Unable to reach the patient's son, Lowanda. LVM at both telephone numbers to contact the facility.

## 2024-03-29 NOTE — Assessment & Plan Note (Deleted)
-  multiple focal (2.2cm with nipple involvement and 0.4cm), invasive ductal carcinoma, grade 2, [id number] - Patient presented with nipple mass and bradycardia -Status post a lumpectomy X2 and targeted lymph node dissection.  I reviewed her surgical pathology, surgical margins were negative except microscopically positive margin at the central lumpectomy. Both 2 lymph node were positive.Given patient's frailty and risk with holding blood thinners, Dr. Aron did not recommend reexcising margin.   -Due to her advanced age, she is not a candidate for chemotherapy -We recommend adjuvant radiation and antiestrogen therapy

## 2024-04-06 ENCOUNTER — Other Ambulatory Visit: Payer: Self-pay | Admitting: *Deleted

## 2024-04-06 DIAGNOSIS — C50812 Malignant neoplasm of overlapping sites of left female breast: Secondary | ICD-10-CM

## 2024-04-09 ENCOUNTER — Telehealth: Payer: Self-pay | Admitting: Radiation Oncology

## 2024-04-09 NOTE — Telephone Encounter (Signed)
 Called patient's son to schedule a FUN visit w. PA Donald. No answer. LVM for a return call.

## 2024-04-12 ENCOUNTER — Ambulatory Visit
Admission: RE | Admit: 2024-04-12 | Discharge: 2024-04-12 | Disposition: A | Discharge status: Home / Self Care | Source: Ambulatory Visit | Attending: Radiation Oncology | Admitting: Radiation Oncology

## 2024-04-12 ENCOUNTER — Encounter: Payer: Self-pay | Admitting: Radiation Oncology

## 2024-04-12 VITALS — BP 151/80 | HR 52 | Temp 97.3°F | Resp 120 | Ht 67.0 in

## 2024-04-12 DIAGNOSIS — I10 Essential (primary) hypertension: Secondary | ICD-10-CM | POA: Insufficient documentation

## 2024-04-12 DIAGNOSIS — E7800 Pure hypercholesterolemia, unspecified: Secondary | ICD-10-CM | POA: Insufficient documentation

## 2024-04-12 DIAGNOSIS — Z510 Encounter for antineoplastic radiation therapy: Secondary | ICD-10-CM | POA: Insufficient documentation

## 2024-04-12 DIAGNOSIS — M129 Arthropathy, unspecified: Secondary | ICD-10-CM | POA: Diagnosis not present

## 2024-04-12 DIAGNOSIS — I2510 Atherosclerotic heart disease of native coronary artery without angina pectoris: Secondary | ICD-10-CM | POA: Insufficient documentation

## 2024-04-12 DIAGNOSIS — C50812 Malignant neoplasm of overlapping sites of left female breast: Secondary | ICD-10-CM | POA: Diagnosis present

## 2024-04-12 DIAGNOSIS — E785 Hyperlipidemia, unspecified: Secondary | ICD-10-CM | POA: Diagnosis not present

## 2024-04-12 DIAGNOSIS — E119 Type 2 diabetes mellitus without complications: Secondary | ICD-10-CM | POA: Insufficient documentation

## 2024-04-12 DIAGNOSIS — Z7902 Long term (current) use of antithrombotics/antiplatelets: Secondary | ICD-10-CM | POA: Diagnosis not present

## 2024-04-12 DIAGNOSIS — K219 Gastro-esophageal reflux disease without esophagitis: Secondary | ICD-10-CM | POA: Insufficient documentation

## 2024-04-12 DIAGNOSIS — Z8673 Personal history of transient ischemic attack (TIA), and cerebral infarction without residual deficits: Secondary | ICD-10-CM | POA: Insufficient documentation

## 2024-04-12 DIAGNOSIS — Z170 Estrogen receptor positive status [ER+]: Secondary | ICD-10-CM | POA: Insufficient documentation

## 2024-04-12 DIAGNOSIS — Z8041 Family history of malignant neoplasm of ovary: Secondary | ICD-10-CM | POA: Diagnosis not present

## 2024-04-12 DIAGNOSIS — C773 Secondary and unspecified malignant neoplasm of axilla and upper limb lymph nodes: Secondary | ICD-10-CM | POA: Insufficient documentation

## 2024-04-12 DIAGNOSIS — E876 Hypokalemia: Secondary | ICD-10-CM | POA: Diagnosis not present

## 2024-04-12 DIAGNOSIS — Z79899 Other long term (current) drug therapy: Secondary | ICD-10-CM | POA: Insufficient documentation

## 2024-04-12 NOTE — Progress Notes (Addendum)
 Radiation Oncology         [phone] ________________________________  Name: Kristi David        MRN: [id number]  Date of Service: 04/12/2024 DOB: [birth year]  RR:Amjxz, Prentice SAUNDERS, FNP  Lanny Callander, MD     REFERRING PHYSICIAN: Lanny Callander, MD   DIAGNOSIS: The encounter diagnosis was Cancer of overlapping sites of left breast [facility]).   HISTORY OF PRESENT ILLNESS: Kristi David is a 83 y.o. female with a diagnosis of left breast cancer.  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  When she went for diagnostic mammography on 01/04/2024, the right breast showed no suspicious findings, but the left breast showed a 7 to 8 mm group of calcifications in the subareolar location extending into the nipple, and a new isodense mass in the upper outer quadrant measuring up to 4 mm was noted.  By ultrasound the mass involves the entire nipple there was no mass or visible microcalcifications in the retroareolar area, and at the 2 o'clock position there was a superficial hypoechoic mass measuring 3 mm in greatest dimension.  Her axilla demonstrated a solitary abnormal lymph node with near complete loss of the fatty hilum.  She on physical exam had a fungating mass involving the entire left nipple which was pink to red in color.  A biopsy of the left breast mass under ultrasound guidance on 01/05/2024 showed a grade 2 invasive ductal carcinoma with associated intermediate grade DCIS. A sampled lymph node in the left axilla showed invasive ductal carcinoma with no lymphoid tissue identified. The breast specimen showed the cancer to be ER positive, PR negative HER2 negative by FISH with a Ki 67 of 10%.  She had staging scans that did not show evidence of metastatic  disease outside the nodes. Dr. Lanny did not recommend systemic chemo based on her age and comorbidities.  She proceeded with lumpectomy and sentinel lymph node biopsy on 03/09/2023 which showed a grade 2 invasive ductal carcinoma measuring 4 mm and one of the lumpectomy specimens with negative margins the closest being 0.5 mm from the inferior margin, the left central lumpectomy specimen showed a 2.2 cm grade 2 invasive ductal carcinoma with mucinous features involving the skin and nipple and perforating the epidermis, associated intermediate grade DCIS with necrosis was identified and she had a close margin for DCIS 1 mm from the superior margin and a positive invasive margin in the posterior margin, additional inferior and superior specimens were negative, a lateral margin excision showed DCIS measuring 4 mm in the new lateral margin was 0.5 mm for DCIS.  Additional medial and posterior margins were negative, and both of the 2 sampled lymph nodes contained metastatic disease.  She met with Dr. Aron who recommended reexcision but the patient is not interested in proceeding and struggled with delirium postoperatively.  She is seen to consider adjuvant radiotherapy.  PREVIOUS RADIATION THERAPY: No   PAST MEDICAL HISTORY:  Past Medical History:  Diagnosis Date   Acute blood loss anemia    Anemia    Arthritis    legs, knees (01/07/2016)   Arthritis of right hand 01/11/2017   Cancer (HCC) 2025   Left Breast Cancer   Coronary artery calcification seen on CT scan  Diabetes mellitus (HCC)    Diastolic dysfunction    Dysarthria, post-stroke    Dysphagia, post-stroke    Facial droop    GERD (gastroesophageal reflux disease)    no meds   Headache    Migraines during menses, but none postmenopausal   High cholesterol    History of blood transfusion    Hyperlipidemia    Hypertension    Hypertensive heart disease    Hypokalemia    Left basal ganglia embolic stroke (HCC) 07/18/2017    Osteoarthritis of right knee 01/06/2016   Seasonal allergies    Shortness of breath dyspnea    if too active   Stroke (HCC) 07/16/2017   right hand weakness   Syncope    passed out and was hospitalized for a couple of days   Type II diabetes mellitus (HCC)    does not take medicine diet controlled       PAST SURGICAL HISTORY: Past Surgical History:  Procedure Laterality Date   AXILLARY SENTINEL NODE BIOPSY Left 03/08/2024   Procedure: BIOPSY, LYMPH NODE, SENTINEL, AXILLARY;  Surgeon: Aron Shoulders, MD;  Location: [facility] OR;  Service: General;  Laterality: Left;  LEFT AXILLARY SEED LOCALIZED LYMPH NODE EXCISION   BREAST BIOPSY Left 01/05/2024   US  LT BREAST BX W LOC DEV 1ST LESION IMG BX SPEC US  GUIDE 01/05/2024 [facility]   BREAST BIOPSY Left 03/06/2024   US  LT RADIOACTIVE SEED LOC 03/06/2024 [facility]AMMOGRAPHY   BREAST BIOPSY  03/06/2024   MM LT RADIOACTIVE SEED LOC MAMMO GUIDE 03/06/2024 [facility]   BREAST LUMPECTOMY Left 03/08/2024   Procedure: BREAST LUMPECTOMY;  Surgeon: Aron Shoulders, MD;  Location: [facility] OR;  Service: General;  Laterality: Left;  LEFT BREAST CENTRAL LUMPECTOMY   BREAST LUMPECTOMY WITH RADIOACTIVE SEED LOCALIZATION Left 03/08/2024   Procedure: BREAST LUMPECTOMY WITH RADIOACTIVE SEED LOCALIZATION;  Surgeon: Aron Shoulders, MD;  Location: [facility] OR;  Service: General;  Laterality: Left;  LEFT BREAST SEED LOCALIZED LUMPECTOMY   BUNIONECTOMY Bilateral    CATARACT EXTRACTION W/ INTRAOCULAR LENS  IMPLANT, BILATERAL Bilateral    INGUINAL HERNIA REPAIR Right    KNEE ARTHROSCOPY WITH EXCISION BAKER'S CYST Right    MR LOWER LEG LEFT ([facility] HX) Left    after a break   PARTIAL KNEE ARTHROPLASTY Right 01/06/2016   Procedure: RIGHT UNICOMPARTMENTAL KNEE ARTHROPLASTY;  Surgeon: Lonni CINDERELLA Poli, MD;  Location: [facility] OR;  Service: Orthopedics;  Laterality: Right;   VEIN LIGATION AND STRIPPING Bilateral      FAMILY HISTORY:  Family History  Problem Relation Age of Onset    Stroke Mother    CAD Father    Ovarian cancer Sister    Cancer Sister        gastric cancer     SOCIAL HISTORY:  reports that she has never smoked. She has never used smokeless tobacco. She reports that she does not drink alcohol and does not use drugs.  The patient is divorced and lives in [place] and lives with her son. 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 [facility] hospital before moving to [facility].    ALLERGIES: Oregano [origanum oil], Peanut-containing drug products, and Tomato   MEDICATIONS:  Current Outpatient Medications  Medication Sig Dispense Refill   atorvastatin  (LIPITOR ) 80 MG tablet Take 1 tablet (80 mg total) by mouth daily at 6 PM. 30 tablet 0   blood glucose meter kit and supplies Dispense based on patient and insurance preference. Use up to four times daily as  directed. (FOR ICD-9 250.00, 250.01). 1 each 0   citalopram  (CELEXA ) 40 MG tablet Take 1 tablet (40 mg total) by mouth daily. 30 tablet 0   clopidogrel  (PLAVIX ) 75 MG tablet Take 1 tablet (75 mg total) by mouth daily. 30 tablet 0   gabapentin  (NEURONTIN ) 600 MG tablet Take 1 tablet (600 mg total) by mouth at bedtime. 60 tablet 1   hyoscyamine  (LEVSIN , ANASPAZ ) 0.125 MG tablet Take 1 tablet (0.125 mg total) by mouth every 6 (six) hours as needed. 45 tablet 2   losartan  (COZAAR ) 100 MG tablet Take 1 tablet (100 mg total) by mouth daily. 30 tablet 0   metoprolol  succinate (TOPROL -XL) 25 MG 24 hr tablet Take 1 tablet (25 mg total) by mouth daily. 1 time curtesy refill for patient, further refill of this type of medication will need to be addressed by patients primary care provider 30 tablet 0   oxyCODONE  (OXY IR/ROXICODONE ) 5 MG immediate release tablet Take 0.5-1 tablets (2.5-5 mg total) by mouth every 6 (six) hours as needed for severe pain (pain score 7-10). 8 tablet 0   solifenacin (VESICARE) 10 MG tablet Take 10 mg by mouth daily as needed (frequent urination).      traMADol  (ULTRAM ) 50 MG tablet  Take 1-2 tablets (50-100 mg total) by mouth every 12 (twelve) hours as needed. (Patient not taking: Reported on 03/02/2024) 40 tablet 0   No current facility-administered medications for this visit.     REVIEW OF SYSTEMS: On review of systems, the patient reports that she is doing well overall. She is having some drainage from her breast incision but only notes small amounts on a dressing. She has had some warmth of the skin but no purulent changes or fevers. She is no longer wearing her surgical bra. No other complaints are verbalized.      PHYSICAL EXAM:  Wt Readings from Last 3 Encounters:  03/08/24 157 lb 10.1 oz (71.5 kg)  03/02/24 157 lb 11.2 oz (71.5 kg)  02/07/24 155 lb 12.8 oz (70.7 kg)   Temp Readings from Last 3 Encounters:  03/08/24 97.6 F (36.4 C)  03/02/24 98.3 F (36.8 C)  01/19/24 (!) 97.3 F (36.3 C) (Temporal)   BP Readings from Last 3 Encounters:  03/08/24 (!) 180/74  03/02/24 (!) 137/56  02/07/24 122/78   Pulse Readings from Last 3 Encounters:  03/08/24 (!) 49  03/02/24 (!) 59  02/07/24 62    In general this is a well appearing African American female in no acute distress. She's alert and oriented x4 and appropriate throughout the examination. Cardiopulmonary assessment is negative for acute distress and she exhibits normal effort. Her left breast has a well-healed surgical incision sites without erythema, but there is pinpoint separation along the middle of the central lumpectomy incision without purulence.     ECOG = 1  0 - Asymptomatic (Fully active, able to carry on all predisease activities without restriction)  1 - Symptomatic but completely ambulatory (Restricted in physically strenuous activity but ambulatory and able to carry out work of a light or sedentary nature. For example, light housework, office work)  2 - Symptomatic, <50% in bed during the day (Ambulatory and capable of all self care but unable to carry out any work activities. Up and  about more than 50% of waking hours)  3 - Symptomatic, >50% in bed, but not bedbound (Capable of only limited self-care, confined to bed or chair 50% or more of waking hours)  4 - Bedbound (  Completely disabled. Cannot carry on any self-care. Totally confined to bed or chair)  5 - Death   Raylene MM, Creech RH, Tormey DC, et al. [phone]). Toxicity and response criteria of the [facility]. Am. DOROTHA Bridges. Oncol. 5 (6): 649-55    LABORATORY DATA:  Lab Results  Component Value Date   WBC 5.1 03/02/2024   HGB 11.6 (L) 03/02/2024   HCT 36.0 03/02/2024   MCV 90.0 03/02/2024   PLT 170 03/02/2024   Lab Results  Component Value Date   NA 140 03/02/2024   K 4.3 03/02/2024   CL 101 03/02/2024   CO2 29 03/02/2024   Lab Results  Component Value Date   ALT 12 01/12/2024   AST 21 01/12/2024   ALKPHOS 85 01/12/2024   BILITOT 1.0 01/12/2024      RADIOGRAPHY: No results found.      IMPRESSION/PLAN: 1. IIIB, M100059, grade 2, ER positive invasive ductal carcinoma of the left breast. Dr. Dewey has reviewed the patient's final pathology findings and today we discussed the nature of node positive breast disease. Dr. Dewey recommends external radiotherapy to the breast and regional nodes to reduce risks of local recurrence. Dr. Lanny also anticipates adjuvant antiestrogen therapy to follow. We discussed the risks, benefits, short, and long term effects of radiotherapy, as well as the curative intent, and the patient is interested in proceeding. I reviewed the the delivery and logistics of radiotherapy and Dr. Dewey recommends 6 1/2 weeks of radiotherapy to the left breast and regional nodes with deep inspiration breath hold technique.  We reviewed the rationale for this and she is in agreement to proceed. Written consent is obtained with the patient asking her son to sign on her behalf and placed in the chart, a copy was provided to the patient. She will simulate today and we have  asked her to resume wearing her compression bra and plan to start 04/24/24 or 04/25/24 to allow the area that has separation to heal prior to proceeding. She was given precautions to call if her symptoms worsen or haven't resolved prior to treatment.    In a visit lasting 45 minutes, greater than 50% of the time was spent face to face reviewing her case, as well as in preparation of, discussing, and coordinating the patient's care.      Donald KYM Husband, [facility]    **Disclaimer: This note was dictated with voice recognition software. Similar sounding words can inadvertently be transcribed and this note may contain transcription errors which may not have been corrected upon publication of note.**

## 2024-04-12 NOTE — Progress Notes (Signed)
 Location of Breast Cancer: Left Breast  Histology per Pathology Report: 03/08/2024 FINAL MICROSCOPIC DIAGNOSIS:   A. BREAST, LEFT WITH SEED, LUMPECTOMY:       Invasive ductal carcinoma, 0.4 cm, grade 2  Ductal carcinoma in situ:  Not identified  Margins, invasive:  Negative             Closest, invasive:  0.5 mm from inferior margin (please see  final margin interpretation)  Lymphovascular invasion:  Not identified  Prognostic markers:  ER positive, PR negative, Her2 negative, Ki-67 10%  Other:  None   Past/Anticipated interventions by medical oncology, if any:  Assessment & Plan 83 year old female with a past medical history of stroke, high blood pressure, and diabetes presents with nipple mass    Multifocal left breast cancer, invasive ductal carcinoma grade 2, ER+/PR-/HER2-, at least stage IIA -Patient presented with nipple mass and bleeding, punch biopsy showed mucinous carcinoma, will try to obtain the path report from her PCP.   - Mammogram and ultrasound showed additional 0.3 cm mass in the 2 o'clock position and although nodal metastasis in the left Axillar.  I reviewed the biopsy results  - I recommend surgical consultation.  Post-surgical radiation and anti-estrogen therapy are planned. Chemotherapy is not recommended due to her age  - Refer to [facility] for surgical consultation. - Consider lumpectomy of the central part and upper outer quadrant of the left breast. - Discuss potential need for mastectomy if additional abnormalities are found on imaging. -will discuss with her surgeon regarding contrast-enhanced mammogram or breast MRI to assess for additional lesions. - Order CT and bone scan to assess for metastatic disease. - Order blood tests.  Receptor Status: ER positive, PR negative, Her2 negative, Ki-67 10%   Did patient present with symptoms (if so, please note symptoms) or was this found on screening mammography?- She noticed an open wound on her left  nipple in Feb 2025, which prompted further attention to the matter.  Past/Anticipated interventions by surgeon, if any: Aron Shoulders, MD 03/08/2024     Procedure Laterality Anesthesia  BREAST LUMPECTOMY Left General  LEFT BREAST CENTRAL LUMPECTOMY    BREAST LUMPECTOMY WITH RADIOACTIVE SEED LOCALIZATION Left General  LEFT BREAST SEED LOCALIZED LUMPECTOMY    BIOPSY, LYMPH NODE, SENTINEL, AXILLARY Left General  LEFT AXILLARY SEED LOCALIZED LYMPH NODE EXCISION     Chemotherapy- None  Lymphedema issues, if any:  Denies  Pain issues, if any:  7/10 Lt breast/ Ribs  Skin issues if any: None-  Healing well from surgery.  SAFETY ISSUES: Prior radiation? No Pacemaker/ICD? No Possible current pregnancy? No- Postmenopausal Is the patient on methotrexate? No  Current Complaints / other details:  None  Vitals: BP (!) 151/80 (BP Location: Right Arm, Patient Position: Sitting, Cuff Size: Normal)   Pulse (!) 52   Temp (!) 97.3 F (36.3 C) (Temporal)   Resp (!) 120   Ht 5' 7 (1.702 m)   SpO2 100%   BMI 24.69 kg/m     This concludes the interaction.  Rosaline Minerva, LPN

## 2024-04-19 ENCOUNTER — Encounter: Payer: Self-pay | Admitting: *Deleted

## 2024-04-19 ENCOUNTER — Other Ambulatory Visit: Payer: Self-pay | Admitting: *Deleted

## 2024-04-19 DIAGNOSIS — C50812 Malignant neoplasm of overlapping sites of left female breast: Secondary | ICD-10-CM

## 2024-04-19 DIAGNOSIS — Z510 Encounter for antineoplastic radiation therapy: Secondary | ICD-10-CM | POA: Diagnosis not present

## 2024-04-20 ENCOUNTER — Other Ambulatory Visit: Admitting: Licensed Clinical Social Worker

## 2024-04-24 ENCOUNTER — Ambulatory Visit
Admission: RE | Admit: 2024-04-24 | Discharge: 2024-04-24 | Disposition: A | Discharge status: Home / Self Care | Source: Ambulatory Visit | Attending: Radiation Oncology | Admitting: Radiation Oncology

## 2024-04-24 ENCOUNTER — Other Ambulatory Visit: Payer: Self-pay

## 2024-04-24 DIAGNOSIS — Z510 Encounter for antineoplastic radiation therapy: Secondary | ICD-10-CM | POA: Diagnosis not present

## 2024-04-24 LAB — RAD ONC ARIA SESSION SUMMARY

## 2024-04-25 ENCOUNTER — Other Ambulatory Visit: Payer: Self-pay

## 2024-04-25 ENCOUNTER — Ambulatory Visit
Admission: RE | Admit: 2024-04-25 | Discharge: 2024-04-25 | Disposition: A | Discharge status: Home / Self Care | Source: Ambulatory Visit | Attending: Radiation Oncology | Admitting: Radiation Oncology

## 2024-04-25 DIAGNOSIS — Z510 Encounter for antineoplastic radiation therapy: Secondary | ICD-10-CM | POA: Diagnosis not present

## 2024-04-25 LAB — RAD ONC ARIA SESSION SUMMARY

## 2024-04-26 ENCOUNTER — Encounter

## 2024-04-26 ENCOUNTER — Other Ambulatory Visit: Payer: Self-pay

## 2024-04-26 ENCOUNTER — Ambulatory Visit
Admission: RE | Admit: 2024-04-26 | Discharge: 2024-04-26 | Disposition: A | Discharge status: Home / Self Care | Source: Ambulatory Visit | Attending: Radiation Oncology | Admitting: Radiation Oncology

## 2024-04-26 DIAGNOSIS — Z510 Encounter for antineoplastic radiation therapy: Secondary | ICD-10-CM | POA: Diagnosis not present

## 2024-04-26 LAB — RAD ONC ARIA SESSION SUMMARY

## 2024-04-27 ENCOUNTER — Ambulatory Visit
Admission: RE | Admit: 2024-04-27 | Discharge: 2024-04-27 | Disposition: A | Discharge status: Home / Self Care | Source: Ambulatory Visit | Attending: Radiation Oncology | Admitting: Radiation Oncology

## 2024-04-27 ENCOUNTER — Encounter: Attending: Hematology

## 2024-04-27 ENCOUNTER — Ambulatory Visit

## 2024-04-27 ENCOUNTER — Other Ambulatory Visit: Payer: Self-pay

## 2024-04-27 DIAGNOSIS — C50812 Malignant neoplasm of overlapping sites of left female breast: Secondary | ICD-10-CM | POA: Diagnosis present

## 2024-04-27 LAB — RAD ONC ARIA SESSION SUMMARY

## 2024-04-27 MED ORDER — RADIAPLEXRX EX GEL: Freq: Once | CUTANEOUS | Status: AC

## 2024-04-27 MED ORDER — ALRA NON-METALLIC DEODORANT (RAD-ONC)
1.0000 | Freq: Once | TOPICAL | Status: AC
  Administered 2024-04-27: 1 via TOPICAL

## 2024-04-30 ENCOUNTER — Other Ambulatory Visit: Payer: Self-pay

## 2024-04-30 ENCOUNTER — Ambulatory Visit
Admission: RE | Admit: 2024-04-30 | Discharge: 2024-04-30 | Disposition: A | Discharge status: Home / Self Care | Source: Ambulatory Visit | Attending: Radiation Oncology

## 2024-04-30 ENCOUNTER — Inpatient Hospital Stay

## 2024-04-30 DIAGNOSIS — C50812 Malignant neoplasm of overlapping sites of left female breast: Secondary | ICD-10-CM | POA: Diagnosis not present

## 2024-04-30 LAB — RAD ONC ARIA SESSION SUMMARY

## 2024-05-01 ENCOUNTER — Ambulatory Visit
Admission: RE | Admit: 2024-05-01 | Discharge: 2024-05-01 | Disposition: A | Discharge status: Home / Self Care | Source: Ambulatory Visit | Attending: Radiation Oncology

## 2024-05-01 ENCOUNTER — Encounter

## 2024-05-01 ENCOUNTER — Other Ambulatory Visit: Payer: Self-pay

## 2024-05-01 DIAGNOSIS — C50812 Malignant neoplasm of overlapping sites of left female breast: Secondary | ICD-10-CM | POA: Diagnosis not present

## 2024-05-01 LAB — RAD ONC ARIA SESSION SUMMARY

## 2024-05-02 ENCOUNTER — Other Ambulatory Visit: Payer: Self-pay

## 2024-05-02 ENCOUNTER — Inpatient Hospital Stay

## 2024-05-02 ENCOUNTER — Ambulatory Visit
Admission: RE | Admit: 2024-05-02 | Discharge: 2024-05-02 | Disposition: A | Discharge status: Home / Self Care | Source: Ambulatory Visit | Attending: Radiation Oncology

## 2024-05-02 DIAGNOSIS — C50812 Malignant neoplasm of overlapping sites of left female breast: Secondary | ICD-10-CM | POA: Diagnosis not present

## 2024-05-02 LAB — RAD ONC ARIA SESSION SUMMARY

## 2024-05-03 ENCOUNTER — Other Ambulatory Visit: Payer: Self-pay

## 2024-05-03 ENCOUNTER — Inpatient Hospital Stay

## 2024-05-03 ENCOUNTER — Ambulatory Visit
Admission: RE | Admit: 2024-05-03 | Discharge: 2024-05-03 | Disposition: A | Discharge status: Home / Self Care | Source: Ambulatory Visit | Attending: Radiation Oncology

## 2024-05-03 DIAGNOSIS — C50812 Malignant neoplasm of overlapping sites of left female breast: Secondary | ICD-10-CM | POA: Diagnosis not present

## 2024-05-03 LAB — RAD ONC ARIA SESSION SUMMARY

## 2024-05-04 ENCOUNTER — Other Ambulatory Visit: Payer: Self-pay

## 2024-05-04 ENCOUNTER — Inpatient Hospital Stay

## 2024-05-04 ENCOUNTER — Ambulatory Visit
Admission: RE | Admit: 2024-05-04 | Discharge: 2024-05-04 | Disposition: A | Discharge status: Home / Self Care | Source: Ambulatory Visit | Attending: Radiation Oncology | Admitting: Radiation Oncology

## 2024-05-04 ENCOUNTER — Other Ambulatory Visit: Payer: Self-pay | Admitting: Radiation Oncology

## 2024-05-04 ENCOUNTER — Ambulatory Visit

## 2024-05-04 DIAGNOSIS — C50812 Malignant neoplasm of overlapping sites of left female breast: Secondary | ICD-10-CM | POA: Diagnosis not present

## 2024-05-04 LAB — RAD ONC ARIA SESSION SUMMARY

## 2024-05-07 ENCOUNTER — Other Ambulatory Visit: Payer: Self-pay

## 2024-05-07 ENCOUNTER — Encounter

## 2024-05-07 ENCOUNTER — Ambulatory Visit
Admission: RE | Admit: 2024-05-07 | Discharge: 2024-05-07 | Disposition: A | Discharge status: Home / Self Care | Source: Ambulatory Visit | Attending: Radiation Oncology | Admitting: Radiation Oncology

## 2024-05-07 DIAGNOSIS — C50812 Malignant neoplasm of overlapping sites of left female breast: Secondary | ICD-10-CM | POA: Diagnosis not present

## 2024-05-07 LAB — RAD ONC ARIA SESSION SUMMARY

## 2024-05-08 ENCOUNTER — Other Ambulatory Visit: Payer: Self-pay

## 2024-05-08 ENCOUNTER — Ambulatory Visit
Admission: RE | Admit: 2024-05-08 | Discharge: 2024-05-08 | Disposition: A | Discharge status: Home / Self Care | Source: Ambulatory Visit | Attending: Radiation Oncology

## 2024-05-08 DIAGNOSIS — C50812 Malignant neoplasm of overlapping sites of left female breast: Secondary | ICD-10-CM | POA: Diagnosis not present

## 2024-05-08 LAB — RAD ONC ARIA SESSION SUMMARY

## 2024-05-09 ENCOUNTER — Other Ambulatory Visit: Payer: Self-pay

## 2024-05-09 ENCOUNTER — Ambulatory Visit
Admission: RE | Admit: 2024-05-09 | Discharge: 2024-05-09 | Disposition: A | Discharge status: Home / Self Care | Source: Ambulatory Visit | Attending: Radiation Oncology | Admitting: Radiation Oncology

## 2024-05-09 DIAGNOSIS — C50812 Malignant neoplasm of overlapping sites of left female breast: Secondary | ICD-10-CM | POA: Diagnosis not present

## 2024-05-09 LAB — RAD ONC ARIA SESSION SUMMARY

## 2024-05-10 ENCOUNTER — Encounter

## 2024-05-10 ENCOUNTER — Other Ambulatory Visit: Payer: Self-pay

## 2024-05-10 ENCOUNTER — Ambulatory Visit
Admission: RE | Admit: 2024-05-10 | Discharge: 2024-05-10 | Disposition: A | Discharge status: Home / Self Care | Source: Ambulatory Visit | Attending: Radiation Oncology | Admitting: Radiation Oncology

## 2024-05-10 DIAGNOSIS — C50812 Malignant neoplasm of overlapping sites of left female breast: Secondary | ICD-10-CM | POA: Diagnosis not present

## 2024-05-10 LAB — RAD ONC ARIA SESSION SUMMARY
Course Elapsed Days: 16
Plan Fractions Treated to Date: 13
Plan Fractions Treated to Date: 13
Plan Prescribed Dose Per Fraction: 1.8 Gy
Plan Prescribed Dose Per Fraction: 1.8 Gy
Plan Total Fractions Prescribed: 28
Plan Total Fractions Prescribed: 28
Plan Total Prescribed Dose: 50.4 Gy
Plan Total Prescribed Dose: 50.4 Gy
Reference Point Dosage Given to Date: 23.4 Gy
Reference Point Dosage Given to Date: 23.4 Gy
Reference Point Session Dosage Given: 1.8 Gy
Reference Point Session Dosage Given: 1.8 Gy
Session Number: 13

## 2024-05-11 ENCOUNTER — Ambulatory Visit
Admission: RE | Admit: 2024-05-11 | Discharge: 2024-05-11 | Disposition: A | Discharge status: Home / Self Care | Source: Ambulatory Visit | Attending: Radiation Oncology | Admitting: Radiation Oncology

## 2024-05-11 ENCOUNTER — Ambulatory Visit: Admitting: Radiation Oncology

## 2024-05-11 ENCOUNTER — Inpatient Hospital Stay

## 2024-05-11 ENCOUNTER — Other Ambulatory Visit: Payer: Self-pay

## 2024-05-11 DIAGNOSIS — C50812 Malignant neoplasm of overlapping sites of left female breast: Secondary | ICD-10-CM

## 2024-05-11 LAB — RAD ONC ARIA SESSION SUMMARY
Course Elapsed Days: 17
Plan Fractions Treated to Date: 14
Plan Fractions Treated to Date: 14
Plan Prescribed Dose Per Fraction: 1.8 Gy
Plan Prescribed Dose Per Fraction: 1.8 Gy
Plan Total Fractions Prescribed: 28
Plan Total Fractions Prescribed: 28
Plan Total Prescribed Dose: 50.4 Gy
Plan Total Prescribed Dose: 50.4 Gy
Reference Point Dosage Given to Date: 25.2 Gy
Reference Point Dosage Given to Date: 25.2 Gy
Reference Point Session Dosage Given: 1.8 Gy
Reference Point Session Dosage Given: 1.8 Gy
Session Number: 14

## 2024-05-11 MED ORDER — RADIAPLEXRX EX GEL
Freq: Once | CUTANEOUS | Status: AC
  Administered 2024-05-11: 1 via TOPICAL

## 2024-05-14 ENCOUNTER — Ambulatory Visit
Admission: RE | Admit: 2024-05-14 | Discharge: 2024-05-14 | Disposition: A | Discharge status: Home / Self Care | Source: Ambulatory Visit | Attending: Radiation Oncology | Admitting: Radiation Oncology

## 2024-05-14 ENCOUNTER — Inpatient Hospital Stay

## 2024-05-14 ENCOUNTER — Other Ambulatory Visit: Payer: Self-pay

## 2024-05-14 DIAGNOSIS — C50812 Malignant neoplasm of overlapping sites of left female breast: Secondary | ICD-10-CM | POA: Diagnosis not present

## 2024-05-14 LAB — RAD ONC ARIA SESSION SUMMARY
Course Elapsed Days: 20
Plan Fractions Treated to Date: 15
Plan Fractions Treated to Date: 15
Plan Prescribed Dose Per Fraction: 1.8 Gy
Plan Prescribed Dose Per Fraction: 1.8 Gy
Plan Total Fractions Prescribed: 28
Plan Total Fractions Prescribed: 28
Plan Total Prescribed Dose: 50.4 Gy
Plan Total Prescribed Dose: 50.4 Gy
Reference Point Dosage Given to Date: 27 Gy
Reference Point Dosage Given to Date: 27 Gy
Reference Point Session Dosage Given: 1.8 Gy
Reference Point Session Dosage Given: 1.8 Gy
Session Number: 15

## 2024-05-15 ENCOUNTER — Other Ambulatory Visit: Payer: Self-pay

## 2024-05-15 ENCOUNTER — Ambulatory Visit

## 2024-05-15 ENCOUNTER — Ambulatory Visit
Admission: RE | Admit: 2024-05-15 | Discharge: 2024-05-15 | Disposition: A | Discharge status: Home / Self Care | Source: Ambulatory Visit | Attending: Radiation Oncology

## 2024-05-15 ENCOUNTER — Inpatient Hospital Stay

## 2024-05-15 DIAGNOSIS — C50812 Malignant neoplasm of overlapping sites of left female breast: Secondary | ICD-10-CM | POA: Diagnosis not present

## 2024-05-15 LAB — RAD ONC ARIA SESSION SUMMARY
Course Elapsed Days: 21
Plan Fractions Treated to Date: 16
Plan Fractions Treated to Date: 16
Plan Prescribed Dose Per Fraction: 1.8 Gy
Plan Prescribed Dose Per Fraction: 1.8 Gy
Plan Total Fractions Prescribed: 28
Plan Total Fractions Prescribed: 28
Plan Total Prescribed Dose: 50.4 Gy
Plan Total Prescribed Dose: 50.4 Gy
Reference Point Dosage Given to Date: 28.8 Gy
Reference Point Dosage Given to Date: 28.8 Gy
Reference Point Session Dosage Given: 1.8 Gy
Reference Point Session Dosage Given: 1.8 Gy
Session Number: 16

## 2024-05-16 ENCOUNTER — Ambulatory Visit
Admission: RE | Admit: 2024-05-16 | Discharge: 2024-05-16 | Disposition: A | Discharge status: Home / Self Care | Source: Ambulatory Visit | Attending: Radiation Oncology

## 2024-05-16 ENCOUNTER — Other Ambulatory Visit: Payer: Self-pay

## 2024-05-16 ENCOUNTER — Inpatient Hospital Stay

## 2024-05-16 DIAGNOSIS — C50812 Malignant neoplasm of overlapping sites of left female breast: Secondary | ICD-10-CM | POA: Diagnosis not present

## 2024-05-16 LAB — RAD ONC ARIA SESSION SUMMARY
Course Elapsed Days: 22
Plan Fractions Treated to Date: 17
Plan Fractions Treated to Date: 17
Plan Prescribed Dose Per Fraction: 1.8 Gy
Plan Prescribed Dose Per Fraction: 1.8 Gy
Plan Total Fractions Prescribed: 28
Plan Total Fractions Prescribed: 28
Plan Total Prescribed Dose: 50.4 Gy
Plan Total Prescribed Dose: 50.4 Gy
Reference Point Dosage Given to Date: 30.6 Gy
Reference Point Dosage Given to Date: 30.6 Gy
Reference Point Session Dosage Given: 1.8 Gy
Reference Point Session Dosage Given: 1.8 Gy
Session Number: 17

## 2024-05-17 ENCOUNTER — Ambulatory Visit
Admission: RE | Admit: 2024-05-17 | Discharge: 2024-05-17 | Disposition: A | Discharge status: Home / Self Care | Source: Ambulatory Visit | Attending: Radiation Oncology | Admitting: Radiation Oncology

## 2024-05-17 ENCOUNTER — Other Ambulatory Visit: Payer: Self-pay

## 2024-05-17 ENCOUNTER — Inpatient Hospital Stay

## 2024-05-17 DIAGNOSIS — C50812 Malignant neoplasm of overlapping sites of left female breast: Secondary | ICD-10-CM | POA: Diagnosis not present

## 2024-05-17 LAB — RAD ONC ARIA SESSION SUMMARY
Course Elapsed Days: 23
Plan Fractions Treated to Date: 18
Plan Fractions Treated to Date: 18
Plan Prescribed Dose Per Fraction: 1.8 Gy
Plan Prescribed Dose Per Fraction: 1.8 Gy
Plan Total Fractions Prescribed: 28
Plan Total Fractions Prescribed: 28
Plan Total Prescribed Dose: 50.4 Gy
Plan Total Prescribed Dose: 50.4 Gy
Reference Point Dosage Given to Date: 32.4 Gy
Reference Point Dosage Given to Date: 32.4 Gy
Reference Point Session Dosage Given: 1.8 Gy
Reference Point Session Dosage Given: 1.8 Gy
Session Number: 18

## 2024-05-18 ENCOUNTER — Ambulatory Visit
Admission: RE | Admit: 2024-05-18 | Discharge: 2024-05-18 | Disposition: A | Discharge status: Home / Self Care | Source: Ambulatory Visit | Attending: Radiation Oncology | Admitting: Radiation Oncology

## 2024-05-18 ENCOUNTER — Inpatient Hospital Stay

## 2024-05-18 ENCOUNTER — Other Ambulatory Visit: Payer: Self-pay

## 2024-05-18 DIAGNOSIS — C50812 Malignant neoplasm of overlapping sites of left female breast: Secondary | ICD-10-CM | POA: Diagnosis not present

## 2024-05-18 LAB — RAD ONC ARIA SESSION SUMMARY
Course Elapsed Days: 24
Plan Fractions Treated to Date: 19
Plan Fractions Treated to Date: 19
Plan Prescribed Dose Per Fraction: 1.8 Gy
Plan Prescribed Dose Per Fraction: 1.8 Gy
Plan Total Fractions Prescribed: 28
Plan Total Fractions Prescribed: 28
Plan Total Prescribed Dose: 50.4 Gy
Plan Total Prescribed Dose: 50.4 Gy
Reference Point Dosage Given to Date: 34.2 Gy
Reference Point Dosage Given to Date: 34.2 Gy
Reference Point Session Dosage Given: 1.8 Gy
Reference Point Session Dosage Given: 1.8 Gy
Session Number: 19

## 2024-05-21 ENCOUNTER — Ambulatory Visit

## 2024-05-21 ENCOUNTER — Ambulatory Visit
Admission: RE | Admit: 2024-05-21 | Discharge: 2024-05-21 | Disposition: A | Discharge status: Home / Self Care | Source: Ambulatory Visit | Attending: Radiation Oncology | Admitting: Radiation Oncology

## 2024-05-21 ENCOUNTER — Other Ambulatory Visit: Payer: Self-pay

## 2024-05-21 ENCOUNTER — Inpatient Hospital Stay

## 2024-05-21 DIAGNOSIS — C50812 Malignant neoplasm of overlapping sites of left female breast: Secondary | ICD-10-CM | POA: Diagnosis not present

## 2024-05-21 LAB — RAD ONC ARIA SESSION SUMMARY
Course Elapsed Days: 27
Plan Fractions Treated to Date: 20
Plan Fractions Treated to Date: 20
Plan Prescribed Dose Per Fraction: 1.8 Gy
Plan Prescribed Dose Per Fraction: 1.8 Gy
Plan Total Fractions Prescribed: 28
Plan Total Fractions Prescribed: 28
Plan Total Prescribed Dose: 50.4 Gy
Plan Total Prescribed Dose: 50.4 Gy
Reference Point Dosage Given to Date: 36 Gy
Reference Point Dosage Given to Date: 36 Gy
Reference Point Session Dosage Given: 1.8 Gy
Reference Point Session Dosage Given: 1.8 Gy
Session Number: 20

## 2024-05-22 ENCOUNTER — Inpatient Hospital Stay

## 2024-05-22 ENCOUNTER — Ambulatory Visit
Admission: RE | Admit: 2024-05-22 | Discharge: 2024-05-22 | Disposition: A | Discharge status: Home / Self Care | Source: Ambulatory Visit | Attending: Radiation Oncology

## 2024-05-22 ENCOUNTER — Other Ambulatory Visit: Payer: Self-pay

## 2024-05-22 DIAGNOSIS — C50812 Malignant neoplasm of overlapping sites of left female breast: Secondary | ICD-10-CM | POA: Diagnosis not present

## 2024-05-22 LAB — RAD ONC ARIA SESSION SUMMARY
Course Elapsed Days: 28
Plan Fractions Treated to Date: 21
Plan Fractions Treated to Date: 21
Plan Prescribed Dose Per Fraction: 1.8 Gy
Plan Prescribed Dose Per Fraction: 1.8 Gy
Plan Total Fractions Prescribed: 28
Plan Total Fractions Prescribed: 28
Plan Total Prescribed Dose: 50.4 Gy
Plan Total Prescribed Dose: 50.4 Gy
Reference Point Dosage Given to Date: 37.8 Gy
Reference Point Dosage Given to Date: 37.8 Gy
Reference Point Session Dosage Given: 1.8 Gy
Reference Point Session Dosage Given: 1.8 Gy
Session Number: 21

## 2024-05-23 ENCOUNTER — Ambulatory Visit
Admission: RE | Admit: 2024-05-23 | Discharge: 2024-05-23 | Disposition: A | Discharge status: Home / Self Care | Source: Ambulatory Visit | Attending: Radiation Oncology

## 2024-05-23 ENCOUNTER — Other Ambulatory Visit: Payer: Self-pay

## 2024-05-23 DIAGNOSIS — C50812 Malignant neoplasm of overlapping sites of left female breast: Secondary | ICD-10-CM | POA: Diagnosis not present

## 2024-05-23 LAB — RAD ONC ARIA SESSION SUMMARY
Course Elapsed Days: 29
Plan Fractions Treated to Date: 22
Plan Fractions Treated to Date: 22
Plan Prescribed Dose Per Fraction: 1.8 Gy
Plan Prescribed Dose Per Fraction: 1.8 Gy
Plan Total Fractions Prescribed: 28
Plan Total Fractions Prescribed: 28
Plan Total Prescribed Dose: 50.4 Gy
Plan Total Prescribed Dose: 50.4 Gy
Reference Point Dosage Given to Date: 39.6 Gy
Reference Point Dosage Given to Date: 39.6 Gy
Reference Point Session Dosage Given: 1.8 Gy
Reference Point Session Dosage Given: 1.8 Gy
Session Number: 22

## 2024-05-24 ENCOUNTER — Ambulatory Visit
Admission: RE | Admit: 2024-05-24 | Discharge: 2024-05-24 | Disposition: A | Discharge status: Home / Self Care | Source: Ambulatory Visit | Attending: Radiation Oncology | Admitting: Radiation Oncology

## 2024-05-24 ENCOUNTER — Other Ambulatory Visit: Payer: Self-pay

## 2024-05-24 DIAGNOSIS — C50812 Malignant neoplasm of overlapping sites of left female breast: Secondary | ICD-10-CM | POA: Diagnosis not present

## 2024-05-24 LAB — RAD ONC ARIA SESSION SUMMARY
Course Elapsed Days: 30
Plan Fractions Treated to Date: 23
Plan Fractions Treated to Date: 23
Plan Prescribed Dose Per Fraction: 1.8 Gy
Plan Prescribed Dose Per Fraction: 1.8 Gy
Plan Total Fractions Prescribed: 28
Plan Total Fractions Prescribed: 28
Plan Total Prescribed Dose: 50.4 Gy
Plan Total Prescribed Dose: 50.4 Gy
Reference Point Dosage Given to Date: 41.4 Gy
Reference Point Dosage Given to Date: 41.4 Gy
Reference Point Session Dosage Given: 1.8 Gy
Reference Point Session Dosage Given: 1.8 Gy
Session Number: 23

## 2024-05-25 ENCOUNTER — Other Ambulatory Visit: Payer: Self-pay

## 2024-05-25 ENCOUNTER — Ambulatory Visit
Admission: RE | Admit: 2024-05-25 | Discharge: 2024-05-25 | Disposition: A | Discharge status: Home / Self Care | Source: Ambulatory Visit | Attending: Radiation Oncology | Admitting: Radiation Oncology

## 2024-05-25 DIAGNOSIS — C50812 Malignant neoplasm of overlapping sites of left female breast: Secondary | ICD-10-CM | POA: Diagnosis not present

## 2024-05-25 LAB — RAD ONC ARIA SESSION SUMMARY
Course Elapsed Days: 31
Plan Fractions Treated to Date: 24
Plan Fractions Treated to Date: 24
Plan Prescribed Dose Per Fraction: 1.8 Gy
Plan Prescribed Dose Per Fraction: 1.8 Gy
Plan Total Fractions Prescribed: 28
Plan Total Fractions Prescribed: 28
Plan Total Prescribed Dose: 50.4 Gy
Plan Total Prescribed Dose: 50.4 Gy
Reference Point Dosage Given to Date: 43.2 Gy
Reference Point Dosage Given to Date: 43.2 Gy
Reference Point Session Dosage Given: 1.8 Gy
Reference Point Session Dosage Given: 1.8 Gy
Session Number: 24

## 2024-05-29 ENCOUNTER — Other Ambulatory Visit: Payer: Self-pay

## 2024-05-29 ENCOUNTER — Ambulatory Visit
Admission: RE | Admit: 2024-05-29 | Discharge: 2024-05-29 | Disposition: A | Discharge status: Home / Self Care | Source: Ambulatory Visit | Attending: Radiation Oncology | Admitting: Radiation Oncology

## 2024-05-29 DIAGNOSIS — C50812 Malignant neoplasm of overlapping sites of left female breast: Secondary | ICD-10-CM | POA: Diagnosis present

## 2024-05-29 LAB — RAD ONC ARIA SESSION SUMMARY
Course Elapsed Days: 35
Plan Fractions Treated to Date: 25
Plan Fractions Treated to Date: 25
Plan Prescribed Dose Per Fraction: 1.8 Gy
Plan Prescribed Dose Per Fraction: 1.8 Gy
Plan Total Fractions Prescribed: 28
Plan Total Fractions Prescribed: 28
Plan Total Prescribed Dose: 50.4 Gy
Plan Total Prescribed Dose: 50.4 Gy
Reference Point Dosage Given to Date: 45 Gy
Reference Point Dosage Given to Date: 45 Gy
Reference Point Session Dosage Given: 1.8 Gy
Reference Point Session Dosage Given: 1.8 Gy
Session Number: 25

## 2024-05-30 ENCOUNTER — Ambulatory Visit
Admission: RE | Admit: 2024-05-30 | Discharge: 2024-05-30 | Disposition: A | Discharge status: Home / Self Care | Source: Ambulatory Visit | Attending: Radiation Oncology

## 2024-05-30 ENCOUNTER — Ambulatory Visit: Admitting: Orthopaedic Surgery

## 2024-05-30 ENCOUNTER — Other Ambulatory Visit: Payer: Self-pay

## 2024-05-30 DIAGNOSIS — C50812 Malignant neoplasm of overlapping sites of left female breast: Secondary | ICD-10-CM | POA: Diagnosis not present

## 2024-05-30 LAB — RAD ONC ARIA SESSION SUMMARY
Course Elapsed Days: 36
Plan Fractions Treated to Date: 26
Plan Fractions Treated to Date: 26
Plan Prescribed Dose Per Fraction: 1.8 Gy
Plan Prescribed Dose Per Fraction: 1.8 Gy
Plan Total Fractions Prescribed: 28
Plan Total Fractions Prescribed: 28
Plan Total Prescribed Dose: 50.4 Gy
Plan Total Prescribed Dose: 50.4 Gy
Reference Point Dosage Given to Date: 46.8 Gy
Reference Point Dosage Given to Date: 46.8 Gy
Reference Point Session Dosage Given: 1.8 Gy
Reference Point Session Dosage Given: 1.8 Gy
Session Number: 26

## 2024-05-31 ENCOUNTER — Telehealth: Payer: Self-pay | Admitting: Nurse Practitioner

## 2024-05-31 ENCOUNTER — Other Ambulatory Visit: Payer: Self-pay

## 2024-05-31 ENCOUNTER — Ambulatory Visit: Admitting: Radiation Oncology

## 2024-05-31 ENCOUNTER — Ambulatory Visit
Admission: RE | Admit: 2024-05-31 | Discharge: 2024-05-31 | Disposition: A | Discharge status: Home / Self Care | Source: Ambulatory Visit | Attending: Radiation Oncology | Admitting: Radiation Oncology

## 2024-05-31 DIAGNOSIS — C50812 Malignant neoplasm of overlapping sites of left female breast: Secondary | ICD-10-CM | POA: Diagnosis not present

## 2024-05-31 LAB — RAD ONC ARIA SESSION SUMMARY
Course Elapsed Days: 37
Plan Fractions Treated to Date: 27
Plan Fractions Treated to Date: 27
Plan Prescribed Dose Per Fraction: 1.8 Gy
Plan Prescribed Dose Per Fraction: 1.8 Gy
Plan Total Fractions Prescribed: 28
Plan Total Fractions Prescribed: 28
Plan Total Prescribed Dose: 50.4 Gy
Plan Total Prescribed Dose: 50.4 Gy
Reference Point Dosage Given to Date: 48.6 Gy
Reference Point Dosage Given to Date: 48.6 Gy
Reference Point Session Dosage Given: 1.8 Gy
Reference Point Session Dosage Given: 1.8 Gy
Session Number: 27

## 2024-05-31 NOTE — Telephone Encounter (Signed)
 Scheduled patient for Survivorship in January. Left the patient a voicemail with the day and time of appointment.

## 2024-06-01 ENCOUNTER — Other Ambulatory Visit: Payer: Self-pay

## 2024-06-01 ENCOUNTER — Ambulatory Visit
Admission: RE | Admit: 2024-06-01 | Discharge: 2024-06-01 | Disposition: A | Discharge status: Home / Self Care | Source: Ambulatory Visit | Attending: Radiation Oncology | Admitting: Radiation Oncology

## 2024-06-01 ENCOUNTER — Ambulatory Visit

## 2024-06-01 DIAGNOSIS — C50812 Malignant neoplasm of overlapping sites of left female breast: Secondary | ICD-10-CM | POA: Diagnosis not present

## 2024-06-01 LAB — RAD ONC ARIA SESSION SUMMARY
Course Elapsed Days: 38
Plan Fractions Treated to Date: 28
Plan Fractions Treated to Date: 28
Plan Prescribed Dose Per Fraction: 1.8 Gy
Plan Prescribed Dose Per Fraction: 1.8 Gy
Plan Total Fractions Prescribed: 28
Plan Total Fractions Prescribed: 28
Plan Total Prescribed Dose: 50.4 Gy
Plan Total Prescribed Dose: 50.4 Gy
Reference Point Dosage Given to Date: 50.4 Gy
Reference Point Dosage Given to Date: 50.4 Gy
Reference Point Session Dosage Given: 1.8 Gy
Reference Point Session Dosage Given: 1.8 Gy
Session Number: 28

## 2024-06-04 ENCOUNTER — Other Ambulatory Visit: Payer: Self-pay

## 2024-06-04 ENCOUNTER — Ambulatory Visit
Admission: RE | Admit: 2024-06-04 | Discharge: 2024-06-04 | Disposition: A | Discharge status: Home / Self Care | Source: Ambulatory Visit | Attending: Radiation Oncology

## 2024-06-04 DIAGNOSIS — C50812 Malignant neoplasm of overlapping sites of left female breast: Secondary | ICD-10-CM | POA: Diagnosis not present

## 2024-06-04 LAB — RAD ONC ARIA SESSION SUMMARY
Course Elapsed Days: 41
Plan Fractions Treated to Date: 1
Plan Prescribed Dose Per Fraction: 2 Gy
Plan Total Fractions Prescribed: 5
Plan Total Prescribed Dose: 10 Gy
Reference Point Dosage Given to Date: 2 Gy
Reference Point Session Dosage Given: 2 Gy
Session Number: 29

## 2024-06-05 ENCOUNTER — Ambulatory Visit
Admission: RE | Admit: 2024-06-05 | Discharge: 2024-06-05 | Disposition: A | Discharge status: Home / Self Care | Source: Ambulatory Visit | Attending: Radiation Oncology

## 2024-06-05 ENCOUNTER — Other Ambulatory Visit: Payer: Self-pay

## 2024-06-05 DIAGNOSIS — C50812 Malignant neoplasm of overlapping sites of left female breast: Secondary | ICD-10-CM | POA: Diagnosis not present

## 2024-06-05 LAB — RAD ONC ARIA SESSION SUMMARY
Course Elapsed Days: 42
Plan Fractions Treated to Date: 2
Plan Prescribed Dose Per Fraction: 2 Gy
Plan Total Fractions Prescribed: 5
Plan Total Prescribed Dose: 10 Gy
Reference Point Dosage Given to Date: 4 Gy
Reference Point Session Dosage Given: 2 Gy
Session Number: 30

## 2024-06-06 ENCOUNTER — Other Ambulatory Visit: Payer: Self-pay

## 2024-06-06 ENCOUNTER — Ambulatory Visit
Admission: RE | Admit: 2024-06-06 | Discharge: 2024-06-06 | Disposition: A | Discharge status: Home / Self Care | Source: Ambulatory Visit | Attending: Radiation Oncology | Admitting: Radiation Oncology

## 2024-06-06 DIAGNOSIS — C50812 Malignant neoplasm of overlapping sites of left female breast: Secondary | ICD-10-CM | POA: Diagnosis not present

## 2024-06-06 LAB — RAD ONC ARIA SESSION SUMMARY
Course Elapsed Days: 43
Plan Fractions Treated to Date: 3
Plan Prescribed Dose Per Fraction: 2 Gy
Plan Total Fractions Prescribed: 5
Plan Total Prescribed Dose: 10 Gy
Reference Point Dosage Given to Date: 6 Gy
Reference Point Session Dosage Given: 2 Gy
Session Number: 31

## 2024-06-07 ENCOUNTER — Ambulatory Visit
Admission: RE | Admit: 2024-06-07 | Discharge: 2024-06-07 | Disposition: A | Discharge status: Home / Self Care | Source: Ambulatory Visit | Attending: Radiation Oncology

## 2024-06-07 ENCOUNTER — Other Ambulatory Visit: Payer: Self-pay

## 2024-06-07 ENCOUNTER — Encounter

## 2024-06-07 DIAGNOSIS — C50812 Malignant neoplasm of overlapping sites of left female breast: Secondary | ICD-10-CM | POA: Diagnosis not present

## 2024-06-07 LAB — RAD ONC ARIA SESSION SUMMARY
Course Elapsed Days: 44
Plan Fractions Treated to Date: 4
Plan Prescribed Dose Per Fraction: 2 Gy
Plan Total Fractions Prescribed: 5
Plan Total Prescribed Dose: 10 Gy
Reference Point Dosage Given to Date: 8 Gy
Reference Point Session Dosage Given: 2 Gy
Session Number: 32

## 2024-06-08 ENCOUNTER — Inpatient Hospital Stay: Attending: Hematology

## 2024-06-08 ENCOUNTER — Other Ambulatory Visit: Payer: Self-pay

## 2024-06-08 ENCOUNTER — Ambulatory Visit
Admission: RE | Admit: 2024-06-08 | Discharge: 2024-06-08 | Discharge status: Home / Self Care | Source: Ambulatory Visit | Attending: Radiation Oncology

## 2024-06-08 ENCOUNTER — Ambulatory Visit
Admission: RE | Admit: 2024-06-08 | Discharge: 2024-06-08 | Disposition: A | Discharge status: Home / Self Care | Source: Ambulatory Visit | Attending: Radiation Oncology

## 2024-06-08 DIAGNOSIS — C50812 Malignant neoplasm of overlapping sites of left female breast: Secondary | ICD-10-CM | POA: Diagnosis not present

## 2024-06-08 LAB — RAD ONC ARIA SESSION SUMMARY
Course Elapsed Days: 45
Plan Fractions Treated to Date: 5
Plan Prescribed Dose Per Fraction: 2 Gy
Plan Total Fractions Prescribed: 5
Plan Total Prescribed Dose: 10 Gy
Reference Point Dosage Given to Date: 10 Gy
Reference Point Session Dosage Given: 2 Gy
Session Number: 33

## 2024-06-12 NOTE — Radiation Completion Notes (Addendum)
  Radiation Oncology         [phone] ________________________________  Name: Kristi David MRN: [id number]  Date of Service: 06/08/2024  DOB: [birth year]  End of Treatment Note  Diagnosis:  Stage IIIB, pT4bN1aM0, grade 2, ER positive invasive ductal carcinoma of the left breast   Intent: Curative     ==========DELIVERED PLANS==========  First Treatment Date: 2024-04-24 Last Treatment Date: 2024-06-08   Plan Name: Breast_L_BH Site: Breast, Left Technique: 3D Mode: Photon Dose Per Fraction: 1.8 Gy Prescribed Dose (Delivered / Prescribed): 50.4 Gy / 50.4 Gy Prescribed Fxs (Delivered / Prescribed): 28 / 28   Plan Name: Brst_L_SCV_BH Site: Sclav-LT Technique: 3D Mode: Photon Dose Per Fraction: 1.8 Gy Prescribed Dose (Delivered / Prescribed): 50.4 Gy / 50.4 Gy Prescribed Fxs (Delivered / Prescribed): 28 / 28   Plan Name: Brst_L_Bst_BH Site: Breast, Left Technique: 3D Mode: Photon Dose Per Fraction: 2 Gy Prescribed Dose (Delivered / Prescribed): 10 Gy / 10 Gy Prescribed Fxs (Delivered / Prescribed): 5 / 5     ==========ON TREATMENT VISIT DATES========== 2024-04-27, 2024-05-04, 2024-05-11, 2024-05-18, 2024-05-25, 2024-06-01, 2024-06-08    See weekly On Treatment Notes in Epic for details in the Media tab (listed as Progress notes on the On Treatment Visit Dates listed above). The patient tolerated radiation. She developed fatigue and anticipated skin changes in the treatment field.   The patient will receive a call in about one month from the radiation oncology department. She will continue follow up with Dr. Lanny as well.      Donald KYM Husband, PAC

## 2024-06-27 ENCOUNTER — Ambulatory Visit: Admitting: Orthopaedic Surgery

## 2024-06-27 ENCOUNTER — Encounter: Payer: Self-pay | Admitting: Orthopaedic Surgery

## 2024-06-27 DIAGNOSIS — M1712 Unilateral primary osteoarthritis, left knee: Secondary | ICD-10-CM

## 2024-06-27 DIAGNOSIS — M170 Bilateral primary osteoarthritis of knee: Secondary | ICD-10-CM | POA: Diagnosis not present

## 2024-06-27 DIAGNOSIS — M1711 Unilateral primary osteoarthritis, right knee: Secondary | ICD-10-CM

## 2024-06-27 MED ORDER — SODIUM HYALURONATE 60 MG/3ML IX PRSY
60.0000 mg | PREFILLED_SYRINGE | INTRA_ARTICULAR | Status: AC | PRN
  Administered 2024-06-27: 60 mg via INTRA_ARTICULAR

## 2024-06-27 NOTE — Progress Notes (Signed)
   Procedure Note  Patient: Kristi David             Date of Birth: [birth year]           MRN: [id number]             Visit Date: 06/27/2024  Procedures: Visit Diagnoses:  1. Unilateral primary osteoarthritis, right knee   2. Unilateral primary osteoarthritis, left knee     Large Joint Inj: R knee on 06/27/2024 3:16 PM Indications: diagnostic evaluation and pain Details: 22 G 1.5 in needle, superolateral approach  Arthrogram: No  Medications: 60 mg Sodium Hyaluronate 60 MG/3ML Outcome: tolerated well, no immediate complications Procedure, treatment alternatives, risks and benefits explained, specific risks discussed. Consent was given by the patient. Immediately prior to procedure a time out was called to verify the correct patient, procedure, equipment, support staff and site/side marked as required. Patient was prepped and draped in the usual sterile fashion.    Large Joint Inj: L knee on 06/27/2024 3:16 PM Indications: diagnostic evaluation and pain Details: 22 G 1.5 in needle, superolateral approach  Arthrogram: No  Medications: 60 mg Sodium Hyaluronate 60 MG/3ML Outcome: tolerated well, no immediate complications Procedure, treatment alternatives, risks and benefits explained, specific risks discussed. Consent was given by the patient. Immediately prior to procedure a time out was called to verify the correct patient, procedure, equipment, support staff and site/side marked as required. Patient was prepped and draped in the usual sterile fashion.    The patient is an 83 year old female well-known to us .  She has significant osteoarthritis of both knees.  She is here today for hyaluronic acid injections in her knees.  It has been over 6 months since her last injections but she was also dealing with breast cancer.  She is slow to mobilize and does use a cane to get around.  Both knees have just a mild effusion with varus malalignment and pain throughout the arc of motion with  patellofemoral crepitation.  We did place Durolane in both knees today which she tolerated well.  She knows to wait at least 6 months between these injections.  Lot #[id number]

## 2024-07-06 NOTE — Progress Notes (Signed)
  Radiation Oncology         [phone] ________________________________  Name: [name] MRN: [id number]  Date of Service: 07/09/2024  DOB: [birth year]  Post Treatment Telephone Note  Diagnosis:  Stage IIIB, pT4bN1aM0, grade 2, ER positive invasive ductal carcinoma of the left breast   First Treatment Date: 2024-04-24 Last Treatment Date: 2024-06-08   Plan Name: Breast_L_BH Site: Breast, Left Technique: 3D Mode: Photon Dose Per Fraction: 1.8 Gy Prescribed Dose (Delivered / Prescribed): 50.4 Gy / 50.4 Gy Prescribed Fxs (Delivered / Prescribed): 28 / 28   Plan Name: Brst_L_SCV_BH Site: Sclav-LT Technique: 3D Mode: Photon Dose Per Fraction: 1.8 Gy Prescribed Dose (Delivered / Prescribed): 50.4 Gy / 50.4 Gy Prescribed Fxs (Delivered / Prescribed): 28 / 28   Plan Name: Brst_L_Bst_BH Site: Breast, Left Technique: 3D Mode: Photon Dose Per Fraction: 2 Gy Prescribed Dose (Delivered / Prescribed): 10 Gy / 10 Gy Prescribed Fxs (Delivered / Prescribed): 5 / 5  The patient was not available for call today.   The patient has scheduled follow up with her medical oncologist Dr. Lanny for ongoing surveillance, and was encouraged to call if she develops concerns or questions regarding radiation.

## 2024-07-09 ENCOUNTER — Ambulatory Visit
Admission: RE | Admit: 2024-07-09 | Discharge: 2024-07-09 | Disposition: A | Discharge status: Home / Self Care | Source: Ambulatory Visit | Attending: Radiation Oncology | Admitting: Radiation Oncology

## 2024-07-09 DIAGNOSIS — C50812 Malignant neoplasm of overlapping sites of left female breast: Secondary | ICD-10-CM | POA: Insufficient documentation

## 2024-10-02 ENCOUNTER — Encounter: Admitting: Nurse Practitioner

## 2024-10-08 ENCOUNTER — Inpatient Hospital Stay: Admitting: Nurse Practitioner

## 2024-10-22 ENCOUNTER — Telehealth: Payer: Self-pay | Admitting: Nurse Practitioner

## 2024-10-22 NOTE — Telephone Encounter (Signed)
 Changed upcoming 1/27 to telephone via email from Lacie Burton due to inclement weather. Called and left VM with the changes made to the patients upcoming appointment.

## 2024-10-23 ENCOUNTER — Inpatient Hospital Stay: Admitting: Nurse Practitioner

## 2024-10-23 ENCOUNTER — Telehealth: Payer: Self-pay | Admitting: Adult Health

## 2024-10-23 NOTE — Telephone Encounter (Signed)
 Pt son called to cancel the appt for today 1/27 to 2/12 and that its a in person visit

## 2024-11-08 ENCOUNTER — Inpatient Hospital Stay: Admitting: Nurse Practitioner

## 2024-11-09 ENCOUNTER — Inpatient Hospital Stay: Admitting: Adult Health
